# Patient Record
Sex: Female | Born: 1935
Health system: Southern US, Community
[De-identification: ages and names within clinical notes are randomized; demographics above are authoritative.]

## PROBLEM LIST (undated history)

## (undated) DIAGNOSIS — I499 Cardiac arrhythmia, unspecified: Secondary | ICD-10-CM

## (undated) DIAGNOSIS — E785 Hyperlipidemia, unspecified: Secondary | ICD-10-CM

## (undated) DIAGNOSIS — H259 Unspecified age-related cataract: Secondary | ICD-10-CM

## (undated) DIAGNOSIS — R0602 Shortness of breath: Secondary | ICD-10-CM

## (undated) DIAGNOSIS — N183 Chronic kidney disease, stage 3 unspecified: Secondary | ICD-10-CM

## (undated) DIAGNOSIS — R000 Tachycardia, unspecified: Secondary | ICD-10-CM

## (undated) DIAGNOSIS — N189 Chronic kidney disease, unspecified: Secondary | ICD-10-CM

## (undated) DIAGNOSIS — E559 Vitamin D deficiency, unspecified: Secondary | ICD-10-CM

## (undated) DIAGNOSIS — I10 Essential (primary) hypertension: Secondary | ICD-10-CM

## (undated) DIAGNOSIS — M6281 Muscle weakness (generalized): Secondary | ICD-10-CM

## (undated) DIAGNOSIS — R002 Palpitations: Secondary | ICD-10-CM

## (undated) DIAGNOSIS — IMO0002 Reserved for concepts with insufficient information to code with codable children: Secondary | ICD-10-CM

## (undated) DIAGNOSIS — E1129 Type 2 diabetes mellitus with other diabetic kidney complication: Secondary | ICD-10-CM

## (undated) DIAGNOSIS — G4700 Insomnia, unspecified: Secondary | ICD-10-CM

## (undated) DIAGNOSIS — N390 Urinary tract infection, site not specified: Secondary | ICD-10-CM

## (undated) DIAGNOSIS — E538 Deficiency of other specified B group vitamins: Secondary | ICD-10-CM

## (undated) DIAGNOSIS — R011 Cardiac murmur, unspecified: Secondary | ICD-10-CM

## (undated) DIAGNOSIS — R7309 Other abnormal glucose: Secondary | ICD-10-CM

## (undated) DIAGNOSIS — M542 Cervicalgia: Secondary | ICD-10-CM

## (undated) DIAGNOSIS — R5381 Other malaise: Secondary | ICD-10-CM

## (undated) DIAGNOSIS — B356 Tinea cruris: Secondary | ICD-10-CM

## (undated) DIAGNOSIS — I639 Cerebral infarction, unspecified: Secondary | ICD-10-CM

## (undated) DIAGNOSIS — M1710 Unilateral primary osteoarthritis, unspecified knee: Secondary | ICD-10-CM

## (undated) DIAGNOSIS — R5383 Other fatigue: Secondary | ICD-10-CM

## (undated) DIAGNOSIS — I951 Orthostatic hypotension: Secondary | ICD-10-CM

## (undated) DIAGNOSIS — R0600 Dyspnea, unspecified: Secondary | ICD-10-CM

## (undated) HISTORY — DX: Type 2 diabetes mellitus with other diabetic kidney complication: E11.29

## (undated) HISTORY — DX: Shortness of breath: R06.02

## (undated) HISTORY — DX: Insomnia, unspecified: G47.00

## (undated) HISTORY — DX: Tachycardia, unspecified: R00.0

## (undated) HISTORY — DX: Tinea cruris: B35.6

## (undated) HISTORY — DX: Cervicalgia: M54.2

## (undated) HISTORY — DX: Other malaise: R53.81

## (undated) HISTORY — DX: Chronic kidney disease, stage 3 unspecified: N18.30

## (undated) HISTORY — DX: Cardiac murmur, unspecified: R01.1

## (undated) HISTORY — PX: TONSILLECTOMY: SUR1361

## (undated) HISTORY — DX: Unspecified age-related cataract: H25.9

## (undated) HISTORY — DX: Other abnormal glucose: R73.09

## (undated) HISTORY — DX: Essential (primary) hypertension: I10

## (undated) HISTORY — DX: Chronic kidney disease, stage 3 (moderate): N18.3

## (undated) HISTORY — DX: Deficiency of other specified B group vitamins: E53.8

## (undated) HISTORY — DX: Vitamin D deficiency, unspecified: E55.9

## (undated) HISTORY — DX: Palpitations: R00.2

## (undated) HISTORY — DX: Muscle weakness (generalized): M62.81

## (undated) HISTORY — PX: EYE SURGERY: SHX253

## (undated) HISTORY — DX: Other malaise: R53.83

## (undated) HISTORY — PX: CATARACT EXTRACTION, BILATERAL: SHX1313

## (undated) HISTORY — DX: Reserved for concepts with insufficient information to code with codable children: IMO0002

## (undated) HISTORY — DX: Unilateral primary osteoarthritis, unspecified knee: M17.10

## (undated) HISTORY — DX: Hyperlipidemia, unspecified: E78.5

---
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
## 1992-03-20 HISTORY — PX: CATARACT EXTRACTION W/PHACO: SHX586

## 2000-03-20 HISTORY — PX: TOE AMPUTATION: SHX809

## 2000-05-31 ENCOUNTER — Encounter: Payer: Self-pay | Admitting: Internal Medicine

## 2000-05-31 ENCOUNTER — Encounter: Admission: RE | Admit: 2000-05-31 | Discharge: 2000-05-31 | Payer: Self-pay | Admitting: Internal Medicine

## 2000-07-04 ENCOUNTER — Ambulatory Visit (HOSPITAL_COMMUNITY): Admission: RE | Admit: 2000-07-04 | Discharge: 2000-07-04 | Payer: Self-pay | Admitting: Internal Medicine

## 2000-10-15 ENCOUNTER — Ambulatory Visit: Admission: RE | Admit: 2000-10-15 | Discharge: 2000-10-15 | Payer: Self-pay | Admitting: Pulmonary Disease

## 2000-10-31 ENCOUNTER — Encounter: Admission: RE | Admit: 2000-10-31 | Discharge: 2000-10-31 | Payer: Self-pay | Admitting: Internal Medicine

## 2000-11-26 ENCOUNTER — Inpatient Hospital Stay (HOSPITAL_COMMUNITY): Admission: EM | Admit: 2000-11-26 | Discharge: 2000-12-01 | Payer: Self-pay

## 2006-02-13 ENCOUNTER — Ambulatory Visit: Payer: Self-pay | Admitting: Infectious Diseases

## 2006-02-13 ENCOUNTER — Inpatient Hospital Stay (HOSPITAL_COMMUNITY): Admission: EM | Admit: 2006-02-13 | Discharge: 2006-02-14 | Payer: Self-pay | Admitting: Emergency Medicine

## 2006-02-14 ENCOUNTER — Ambulatory Visit: Payer: Self-pay | Admitting: Cardiovascular Disease

## 2006-02-14 ENCOUNTER — Encounter: Payer: Self-pay | Admitting: Cardiovascular Disease

## 2008-11-10 ENCOUNTER — Encounter: Payer: Self-pay | Admitting: Internal Medicine

## 2008-11-10 ENCOUNTER — Encounter: Admission: RE | Admit: 2008-11-10 | Discharge: 2008-11-10 | Payer: Self-pay | Admitting: Internal Medicine

## 2008-11-30 ENCOUNTER — Ambulatory Visit: Payer: Self-pay | Admitting: Internal Medicine

## 2008-11-30 DIAGNOSIS — I10 Essential (primary) hypertension: Secondary | ICD-10-CM | POA: Insufficient documentation

## 2008-11-30 DIAGNOSIS — Z8679 Personal history of other diseases of the circulatory system: Secondary | ICD-10-CM | POA: Insufficient documentation

## 2008-11-30 DIAGNOSIS — R0600 Dyspnea, unspecified: Secondary | ICD-10-CM | POA: Insufficient documentation

## 2010-08-05 NOTE — Op Note (Signed)
[place]. [facility]  Patient:    Lisa [name], Lisa [name] Visit Number: [id number] MRN: [id number]          Service Type: MED Location: 5000 [id number] Attending Physician:  Sherri Rad Proc. Date: 11/26/00 Admit Date:  11/26/2000                             Operative Report  PREOPERATIVE DIAGNOSIS:  Lawnmower injury, left foot, with severe dorsal and plantar skeletonization of the great toe and second toe with gross contamination and devascularization.  POSTOPERATIVE DIAGNOSIS:  Lawnmower injury, left foot, with severe dorsal and plantar skeletonization of the great toe and second toe with gross contamination and devascularization.  PROCEDURE:  Left great toe and second toe amputations through the metatarsophalangeal joint with irrigation and debridement to bone.  ANESTHESIA:  General endotracheal tube.  SURGEON:  Sherri Rad, M.D.  ASSISTANT:  None.  TOURNIQUET:  None.  COMPLICATIONS:  None.  DISPOSITION:  Stable to the PR.  INDICATIONS:  This is a 77 year old female who was mowing her lawn today when she slipped and the lawnmower cut off the left great toe and second toe.  She was then taken to [facility] ER where x-rays were obtained and I was consulted for further evaluation and treatment.  She was taken emergently to the OR.  She was given Ancef, gentamicin, and penicillin g.  It was explained that there was a chance that she may develop infection.  She will require an amputation of the left great toe and second toe, most likely, and with repeat debridements.  All questions were answered.  DESCRIPTION OF PROCEDURE:  The patient was brought to the operating room and placed in the supine position after adequate general endotracheal tube anesthesia was administered as well as the Ancef, gentamicin, and penicillin g were given IV piggyback.  The left lower extremity was then prepped and draped in a sterile manner.  No tourniquet was used.  The  grass and dirt were debrided with pulse lavage as well as meticulous curetting and sharp dissection, rongeur, etc.  The second toe as well as the great toe were completely skeletonized with the great toe having a large plantar flap.  The periosteum was also removed.  Tendons were lacerated.  It was decided at this point that the great toe as well as the second toe would be amputated at the metatarsophalangeal joint.  Grossly devascularized skin was removed involving the second toe but the plantar portion of the great toe was left for it seemed to be viable.  Once it was irrigated with nine liters of normal saline as well as approximately a liter of antibiotic solution, the wound was loosely closed with 3-0 nylon suture.  Sterile dressing was applied and the patient was stable at PR.  POSTOPERATIVE COURSE:  The patient will undergo repeat irrigation and debridement on Wednesday of this week in 48 hours.  I will also consult Dr. Stephens November, a plastic surgeon, for possible closure and flapping of this area. Dr. Stephens November worked on a friend of the family and this is why we will consult him.  We will continue the IV antibiotics with Ancef, gentamicin, and penicillin g.  We will keep this elevated.  She is nonweightbearing, left lower extremity. Attending Physician:  Sherri Rad DD:  11/26/00 TD:  11/26/00 Job: [id number]

## 2010-08-05 NOTE — H&P (Signed)
[place]. [facility]  Patient:    Lisa [name], Lisa [name] Visit Number: [id number] MRN: [id number]          Service Type: MED Location: [phone] [id number] Attending Physician:  Pearletha Alfred Dictated by:   Sherri Rad, M.D. Admit Date:  11/26/2000                           History and Physical  HISTORY & PHYSICAL AND EMERGENCY ROOM CONSULTATION  CHIEF COMPLAINT:  Left foot pain.  HISTORY:  Lisa [name] is a 77 year old female who, while mowing her lawn today, slipped and had her left foot run over by the lawn mower.  She had a severe injury to the left forefoot.  She was then taken to [facility] ER where x-rays were obtained, and I was consulted for further evaluation and treatment.  PAST MEDICAL HISTORY: 1. Hypertension. 2. Tubal ligation approximately 50 years ago. 3. Tonsillectomy.  SOCIAL HISTORY:  She does not smoke.  ALLERGIES:  No known drug allergies.  MEDICATIONS:  She takes Norvasc for hypertension.  PHYSICAL EXAMINATION:  EXTREMITIES:  She has gross grass and dirt contamination of a mangled left forefoot.  She has also palpable dorsalis pedis and posterior tibial pulses, no active bleeding, just oozing.  The second toe as well as the great toe are skeletonized with a plantar flap that is grossly contaminated.  LABORATORY DATA: X-rays reveal gross comminution of the distal phalanx of the left great toe as well as an amputation through the distal phalanx of the second lesser toe on three views.  IMPRESSION:  Severe lawn mower injury of left forefoot.  PLAN:  We will perform an irrigation and debridement of her left foot.  We will most likely have to perform an amputation of the left great toe as well as the second toe.  She will most likely need a plastic surgery consultation for possible closure or local flap.  We will most likely do a repeat I&D in 48 hours, and she will also have Ancef, gentamicin, and penicillin G. Dictated by:   Sherri Rad, M.D. Attending Physician:  [place] B DD:  11/26/00 TD:  11/26/00 Job: [id number]

## 2010-08-05 NOTE — Op Note (Signed)
[place]. [facility]  Patient:    Lisa [name], Lisa [name] Visit Number: [id number] MRN: [id number]          Service Type: MED Location: 5000 [id number] Attending Physician:  Sherri Rad Dictated by:   Yaakov Guthrie. Shon Hough, M.D. Proc. Date: 11/28/00 Admit Date:  11/26/2000   CC:         Sherri Rad, M.D.   Operative Report  BRIEF HISTORY:  This is a 77 year old lady who was admitted to the hospital 3 days ago because of severe injuries to her left foot secondary to a lawn mower accident. The patients left foot accidentally stepped on the lawn mower sustaining multiple trauma.  She was admitted to the hospital by Dr. Leonides Grills, for the foot injury, and he took her immediately to surgery and underwent debridement, and amputation of the second toe, and temporary flap closure of the area.  PROCEDURES PLANNED:  Debridement, washing of the area, advancement of the flap which is based on the plantar surface.  Split thickness skin graft to the third toe.  SURGEON:  Yaakov Guthrie. Shon Hough, M.D.  ASSISTANT:  Sherri Rad, M.D.  ANESTHESIA:  General.  DESCRIPTION OF PROCEDURE:  The patient underwent general anesthesia and was intubated orally.  The prep was done to the left foot leg areas as well as the left thigh separately using Betadine soap and solution and walled off with sterile towels and drapes so as to make a sterile field.  The previous flap sutures were removed, then the areas were irrigated out with copious amounts of pulsatile lavage using several liters of fluid.  The wound seemed actually very clean.  No evidence of any gross contamination.  The flap was then examined and the edges were debrided.  The skin over the medial portion of the third toe was debrided in that whole area down to underlying bleeding.  Next, the flap was placed into an area to cover over the underlying tissues in a V-Y fashion as well and part of the Y was closed  posteriorly with 4-0 nylon, and then a flap was attached loosely as it was rotated in position without tension with 4-0 nylon.  Next, the split thickness skin graft of 0.025 thickness was taken from the left anterolateral thigh using a dermatome.  The donor site was covered with scarlet red, Telfa, 4 x 4s, ABDs, hypofix and then the graft was placed over the defect, and secured with 4-0 Prolene and 4-0 nylon sutures. xeroform gauze and a fluffy dressing was applied to the foot.  ABDs, Kerlix and Ace wrap.  She withstood the procedures very well and was taken to recovery in excellent condition.  Will follow her along and reexamine her on Friday morning of this week to assess the wounds without any hematoma formation or infection.  We will continue antibiotic therapy by mouth and in the meantime Duricef 500 mg twice a day. Dictated by:   Yaakov Guthrie. Shon Hough, M.D. Attending Physician:  Sherri Rad DD:  11/28/00 TD:  11/28/00 Job: [id number]

## 2010-08-05 NOTE — Discharge Summary (Signed)
[place]. [facility]  Patient:    Lisa [name], Lisa [name] Visit Number: [id number] MRN: [id number]          Service Type: Attending:  Sherri Rad, M.D. Dictated by:   Druscilla Brownie. Shela Nevin, P.A. Adm. Date:  11/26/00 Disc. Date: 12/01/00   CC:         Earvin Hansen L. Shon Hough, M.D.   Discharge Summary  ADMITTING DIAGNOSES: 1. Severe lawn mower injury of the left forefoot. 2. Hypertension.  DISCHARGE DIAGNOSES: 1. Severe lawn mower injury of the left forefoot. 2. Hypertension.  CONSULTATION:  Yaakov Guthrie. Shon Hough, M.D., plastic surgery.  PROCEDURE:  On November 28, 2000, the patient underwent debridement and washing of the area, advancement of flap which is based on the plantar surface with split-thickness skin graft to the third toe.  Surgeon Dr. Louisa Second and Dr. Leonides Grills assisted.  HISTORY OF PRESENT ILLNESS:  This 77 year old lady was admitted to the hospital after a severe lawn mower injury to her left foot.  She accidentally stepped on the lawn mower and sustained multiple trauma.  Dr. Lestine Box admitted the patient and then we asked to Dr. Shon Hough to assist [place] for the surgery mentioned above.  Immediately on discharge, I&D was done and unfortunately had to amputate the second toe.  Dr. Shon Hough was brought in for his expertise concerning the split-thickness skin graft to the toe.  HOSPITAL COURSE:  The patient tolerated the surgery quite well. Unfortunately, no gross infection was seen.  She did have a moderate amount of discomfort postoperatively.  We eventually were able to control this.  The remaining digits remained quite viable.  The patient was placed on IV antibiotics and continued throughout her hospitalization.  Penicillin G 3 million units IV q.4h. was given.  We also put her on gentamicin 90 mg IV q.12h.  She did well postoperatively.  Dr. Lestine Box did dressing change.  She will follow up with Dr. Shon Hough Thursday after  discharge and then see Dr. Lestine Box after that.  DISCHARGE MEDICATIONS: 1. Keflex 500 mg one p.o. q.i.d. x 6 weeks. 2. Vicodin.  LABORATORY DATA AND X-RAY FINDINGS:  CBC with differential within normal limits as well as her blood chemistries.  Chest x-ray showed no active disease.  Electrocardiogram showed normal sinus rhythm with right bundle branch block.  CONDITION ON DISCHARGE:  Improved and stable.  FOLLOWUP:  The patient is to see Dr. Shon Hough this next Thursday and then follow with Dr. Lestine Box after Dr. Shon Hough has discharged her.  SPECIAL INSTRUCTIONS:  She will call should she have any problems.  Any concerns of the foot should be through Dr. Shon Hough. ictated by:   Druscilla Brownie. Shela Nevin, P.A. Attending:  Sherri Rad, M.D. DD:  12/01/00 TD:  12/01/00 Job: [phone] [id number]

## 2012-09-10 ENCOUNTER — Other Ambulatory Visit: Payer: Medicare Other

## 2012-09-10 ENCOUNTER — Other Ambulatory Visit: Payer: Self-pay | Admitting: *Deleted

## 2012-09-10 DIAGNOSIS — E785 Hyperlipidemia, unspecified: Secondary | ICD-10-CM

## 2012-09-10 DIAGNOSIS — E559 Vitamin D deficiency, unspecified: Secondary | ICD-10-CM

## 2012-09-10 DIAGNOSIS — E1129 Type 2 diabetes mellitus with other diabetic kidney complication: Secondary | ICD-10-CM

## 2012-09-10 DIAGNOSIS — I10 Essential (primary) hypertension: Secondary | ICD-10-CM

## 2012-09-11 LAB — BASIC METABOLIC PANEL
BUN/Creatinine Ratio: 21 (ref 11–26)
BUN: 26 mg/dL (ref 8–27)
CO2: 23 mmol/L (ref 18–29)
Calcium: 9.1 mg/dL (ref 8.6–10.2)
Chloride: 103 mmol/L (ref 97–108)
Creatinine, Ser: 1.22 mg/dL — ABNORMAL HIGH (ref 0.57–1.00)
GFR calc Af Amer: 45 mL/min/{1.73_m2} — ABNORMAL LOW (ref >59)
GFR calc non Af Amer: 39 mL/min/{1.73_m2} — ABNORMAL LOW (ref >59)
Glucose: 115 mg/dL — ABNORMAL HIGH (ref 65–99)
Potassium: 4.5 mmol/L (ref 3.5–5.2)
Sodium: 141 mmol/L (ref 134–144)

## 2012-09-11 LAB — CBC WITH DIFFERENTIAL/PLATELET
Basophils Absolute: 0 10*3/uL (ref 0.0–0.2)
Basos: 1 % (ref 0–3)
Eos: 6 % — ABNORMAL HIGH (ref 0–5)
Eosinophils Absolute: 0.4 10*3/uL (ref 0.0–0.4)
HCT: 41 % (ref 34.0–46.6)
Hemoglobin: 13.9 g/dL (ref 11.1–15.9)
Immature Grans (Abs): 0 10*3/uL (ref 0.0–0.1)
Immature Granulocytes: 0 % (ref 0–2)
Lymphocytes Absolute: 1.6 10*3/uL (ref 0.7–3.1)
Lymphs: 25 % (ref 14–46)
MCH: 29.8 pg (ref 26.6–33.0)
MCHC: 33.9 g/dL (ref 31.5–35.7)
MCV: 88 fL (ref 79–97)
Monocytes Absolute: 0.5 10*3/uL (ref 0.1–0.9)
Monocytes: 7 % (ref 4–12)
Neutrophils Absolute: 4 10*3/uL (ref 1.4–7.0)
Neutrophils Relative %: 61 % (ref 40–74)
RBC: 4.67 x10E6/uL (ref 3.77–5.28)
RDW: 13.6 % (ref 12.3–15.4)
WBC: 6.5 10*3/uL (ref 3.4–10.8)

## 2012-09-11 LAB — VITAMIN D 25 HYDROXY (VIT D DEFICIENCY, FRACTURES): Vit D, 25-Hydroxy: 34.2 ng/mL (ref 30.0–100.0)

## 2012-09-11 LAB — LIPID PANEL
Chol/HDL Ratio: 7.5 ratio units — ABNORMAL HIGH (ref 0.0–4.4)
Cholesterol, Total: 188 mg/dL (ref 100–199)
HDL: 25 mg/dL — ABNORMAL LOW (ref >39)
LDL Calculated: 114 mg/dL — ABNORMAL HIGH (ref 0–99)
Triglycerides: 246 mg/dL — ABNORMAL HIGH (ref 0–149)
VLDL Cholesterol Cal: 49 mg/dL — ABNORMAL HIGH (ref 5–40)

## 2012-09-11 LAB — MICROALBUMIN / CREATININE URINE RATIO
Creatinine, Ur: 48.3 mg/dL (ref 15.0–328.0)
MICROALB/CREAT RATIO: 29.8 mg/g creat (ref 0.0–30.0)
Microalbumin, Urine: 14.4 ug/mL (ref 0.0–17.0)

## 2012-09-11 LAB — HEMOGLOBIN A1C
Est. average glucose Bld gHb Est-mCnc: 131 mg/dL
Hgb A1c MFr Bld: 6.2 % — ABNORMAL HIGH (ref 4.8–5.6)

## 2012-09-12 ENCOUNTER — Encounter: Payer: Self-pay | Admitting: *Deleted

## 2012-09-12 ENCOUNTER — Ambulatory Visit (INDEPENDENT_AMBULATORY_CARE_PROVIDER_SITE_OTHER): Payer: Medicare Other | Admitting: Internal Medicine

## 2012-09-12 ENCOUNTER — Encounter: Payer: Self-pay | Admitting: Internal Medicine

## 2012-09-12 VITALS — BP 158/84 | HR 72 | Temp 97.9°F | Resp 18 | Ht 67.0 in | Wt 165.0 lb

## 2012-09-12 DIAGNOSIS — E1129 Type 2 diabetes mellitus with other diabetic kidney complication: Secondary | ICD-10-CM | POA: Insufficient documentation

## 2012-09-12 DIAGNOSIS — N183 Chronic kidney disease, stage 3 unspecified: Secondary | ICD-10-CM | POA: Insufficient documentation

## 2012-09-12 DIAGNOSIS — M542 Cervicalgia: Secondary | ICD-10-CM | POA: Insufficient documentation

## 2012-09-12 DIAGNOSIS — R011 Cardiac murmur, unspecified: Secondary | ICD-10-CM

## 2012-09-12 DIAGNOSIS — R0602 Shortness of breath: Secondary | ICD-10-CM

## 2012-09-12 DIAGNOSIS — IMO0002 Reserved for concepts with insufficient information to code with codable children: Secondary | ICD-10-CM

## 2012-09-12 DIAGNOSIS — G4700 Insomnia, unspecified: Secondary | ICD-10-CM

## 2012-09-12 DIAGNOSIS — E785 Hyperlipidemia, unspecified: Secondary | ICD-10-CM

## 2012-09-12 DIAGNOSIS — M1710 Unilateral primary osteoarthritis, unspecified knee: Secondary | ICD-10-CM

## 2012-09-12 DIAGNOSIS — I10 Essential (primary) hypertension: Secondary | ICD-10-CM

## 2012-09-12 DIAGNOSIS — M1711 Unilateral primary osteoarthritis, right knee: Secondary | ICD-10-CM | POA: Insufficient documentation

## 2012-09-12 MED ORDER — ZOLPIDEM TARTRATE 5 MG PO TABS: 5.0000 mg | ORAL_TABLET | Freq: Every evening | ORAL | Status: DC | PRN

## 2012-09-12 MED ORDER — TRAMADOL HCL 50 MG PO TABS: 50.0000 mg | ORAL_TABLET | Freq: Every day | ORAL | Status: DC | PRN

## 2012-09-12 NOTE — Progress Notes (Signed)
Patient ID: Lisa [name], female   DOB: 05-01-1921, 79 y.o.   MRN: [id number] Location:  [facility]  Code Status: DNR   No Known Allergies  Chief Complaint  Patient presents with  . Medical Managment of Chronic Issues    no new problems - still SOB. Needs new sleep aid.    HPI: Patient is a 79 y.o. female seen in the office today for f/u of her chronic conditions. Gets shorter of breath everyday and can't get her breath.  Is more tired.  Has to go a lot slower than she used to.  Occasional palpitations.  No orthopnea.  Only sob with exertion and progressive.  BP continues to fluctuate--notes when eats salt and when comes here it is high.  Discussed repeating an echo to reassess her cardiac function, but she does not really want this unless it will change management.  She's had an xray, echo, stress test and seen both cardiology and pulmonary about 5 years ago about her breathing problem, and nothing came of the tests.  Says if valve function was worse, she would not want any surgery on heart of any sort.  Has a brother with an aortic valve problem who has been living for years since he was told he needed surgery.  Review of Systems:  Review of Systems  Constitutional: Positive for malaise/fatigue. Negative for fever and chills.  HENT: Positive for neck pain. Negative for hearing loss and congestion.   Eyes: Negative for blurred vision.  Respiratory: Positive for shortness of breath. Negative for cough.   Cardiovascular: Negative for chest pain, palpitations, orthopnea, leg swelling and PND.  Gastrointestinal: Negative for heartburn.  Genitourinary: Negative for dysuria.  Musculoskeletal: Positive for joint pain. Negative for falls.  Skin: Negative for rash.  Neurological: Negative for dizziness.  Endo/Heme/Allergies: Bruises/bleeds easily.  Psychiatric/Behavioral: Negative for depression and memory loss.     Past Medical History   Diagnosis Date  . Unspecified vitamin D deficiency   . Regional enteritis of unspecified site   . Hyperlipidemia LDL goal < 100   . Osteoarthrosis, unspecified whether generalized or localized, lower leg   . Dermatophytosis of groin and perianal area   . Cervicalgia   . Insomnia, unspecified   . Chronic kidney disease, stage III (moderate)   . Type II or unspecified type diabetes mellitus with renal manifestations, not stated as uncontrolled(250.40)   . Other abnormal glucose   . Other B-complex deficiencies   . Other malaise and fatigue   . Senile cataract, unspecified   . Unspecified essential hypertension   . Muscle weakness (generalized)   . Tachycardia, unspecified   . Palpitations   . Undiagnosed cardiac murmurs   . Shortness of breath     Past Surgical History  Procedure Laterality Date  . Tonsillectomy  1944  . Tubal ligation  1951  . Toe amputation  2002  . Eye surgery      Social History:   reports that she has never smoked. She does not have any smokeless tobacco history on file. She reports that she does not drink alcohol or use illicit drugs.  No family history on file.  Medications: Patient's Medications  New Prescriptions   ZOLPIDEM (AMBIEN) 5 MG TABLET    Take 1 tablet (5 mg total) by mouth at bedtime as needed for sleep.  Previous Medications   ASPIRIN 81 MG TABLET    Take 81 mg by mouth daily.   CALCIUM-VITAMIN  D (OSCAL WITH D) 500-200 MG-UNIT PER TABLET    Take 1 tablet by mouth.   CARVEDILOL (COREG) 6.25 MG TABLET    Take one tablet by mouth twice daily for blood pressure   CYANOCOBALAMIN 1000 MCG TABLET    Take 100 mcg by mouth daily.   HYDROCHLOROTHIAZIDE (HYDRODIURIL) 25 MG TABLET    Take one tablet by mouth once daily as needed   VITAMIN D, ERGOCALCIFEROL, (DRISDOL) 50000 UNITS CAPS    50,000 Units. Take one capsule by mouth weekly for vitamin D  Modified Medications   Modified Medication Previous Medication   TRAMADOL (ULTRAM) 50 MG TABLET  traMADol (ULTRAM) 50 MG tablet      Take 1 tablet (50 mg total) by mouth daily as needed for pain. Take one tablet twice daily as needed for neck pain    Take one tablet twice daily as needed for neck pain  Discontinued Medications   ZOLPIDEM (AMBIEN) 5 MG TABLET    Take one tablet by mouth at bedtime for sleep     Physical Exam: Filed Vitals:   09/12/12 1320  BP: 158/84  Pulse: 72  Temp: 97.9 F (36.6 C)  TempSrc: Oral  Resp: 18  Height: 5\' 7"  (1.702 m)  Weight: 165 lb (74.844 kg)  SpO2: 97%  Physical Exam  Constitutional: She is oriented to person, place, and time. She appears well-developed and well-nourished. No distress.  Caucasian female  HENT:  Head: Normocephalic and atraumatic.  Cardiovascular: Normal rate, regular rhythm and intact distal pulses.   Murmur heard. Pulmonary/Chest: Effort normal and breath sounds normal. No respiratory distress. She has no wheezes.  Abdominal: Soft. Bowel sounds are normal. She exhibits no distension. There is no tenderness.  Musculoskeletal: Normal range of motion. She exhibits no edema and no tenderness.  Neurological: She is alert and oriented to person, place, and time.  Skin: Skin is warm and dry.  Psychiatric: She has a normal mood and affect.    Labs reviewed: Basic Metabolic Panel:  Recent Labs  40/98/11 0823  NA 141  K 4.5  CL 103  CO2 23  GLUCOSE 115*  BUN 26  CREATININE 1.22*  CALCIUM 9.1  CBC:  Recent Labs  09/10/12 0823  WBC 6.5  NEUTROABS 4.0  HGB 13.9  HCT 41.0  MCV 88   Lipid Panel:  Recent Labs  09/10/12 0823  HDL 25*  LDLCALC 114*  TRIG 246*  CHOLHDL 7.5*   Lab Results  Component Value Date   HGBA1C 6.2* 09/10/2012    Past Procedures:  Assessment/Plan Osteoarthrosis, unspecified whether generalized or localized, lower leg Stable with tramadol as needed.  Has good and bad days.  Remains active, but cannot get as much done as she used to.    Undiagnosed cardiac murmurs Has had  workup three times for shortness of breath w/o findings.  Notes her dyspnea on exertion does seem to gradually be getting worse and keeping her from doing quite as much in a day, but she does not want any repeat studies like echo, xray, CT chest.  Quality of life remains reasonable for her.    Shortness of breath Suspect this may be related to progression of valvular disease or simply cardiac pump failure over time, but she does not want more investigation.    Type II or unspecified type diabetes mellitus with renal manifestations, not stated as uncontrolled(250.40) hba1c remains in prediabetic range and has actually been improving over the past year.  Not on meds  for this except baby asa.  Has refused statin therapy.  Chronic kidney disease, stage III (moderate) Has been stable.  Avoid nsaids and nephrotoxic meds.  Keep hydrated.  Cervicalgia Neck pain comes and goes, tramadol is working well.  No changes needed.  HYPERTENSION bp goal is <150/90.  Is above goal today, but notes that outside of the office, it is "all over the board" from 110s to 180s, but without any consistency.  Does not want changes.   Labs/tests ordered:  None today Next appt:  6 mos

## 2012-09-14 ENCOUNTER — Encounter: Payer: Self-pay | Admitting: Internal Medicine

## 2012-09-14 DIAGNOSIS — E785 Hyperlipidemia, unspecified: Secondary | ICD-10-CM | POA: Insufficient documentation

## 2012-09-14 DIAGNOSIS — G4700 Insomnia, unspecified: Secondary | ICD-10-CM | POA: Insufficient documentation

## 2012-09-14 NOTE — Assessment & Plan Note (Signed)
F/u flp showed LDL remains above goal, but she will not take a statin or any cholesterol pills, but is trying to watch what she eats.

## 2012-09-14 NOTE — Assessment & Plan Note (Signed)
Stable with tramadol as needed.  Has good and bad days.  Remains active, but cannot get as much done as she used to.

## 2012-09-14 NOTE — Assessment & Plan Note (Signed)
Has been stable.  Avoid nsaids and nephrotoxic meds.  Keep hydrated.

## 2012-09-14 NOTE — Assessment & Plan Note (Signed)
For some reason, her insurance stopped covering her ambien 5mg .  She insists on having this to help her sleep and has not had complications from it.  When I ordered it, it appears it will now be covered.  She is to call if it is not so I can switch her to an alternative that is more affordable for her.

## 2012-09-14 NOTE — Assessment & Plan Note (Addendum)
bp goal is <150/90.  Is above goal today, but notes that outside of the office, it is "all over the board" from 110s to 180s, but without any consistency.  Does not want changes.

## 2012-09-14 NOTE — Assessment & Plan Note (Signed)
hba1c remains in prediabetic range and has actually been improving over the past year.  Not on meds for this except baby asa.  Has refused statin therapy.

## 2012-09-14 NOTE — Assessment & Plan Note (Signed)
Has had workup three times for shortness of breath w/o findings.  Notes her dyspnea on exertion does seem to gradually be getting worse and keeping her from doing quite as much in a day, but she does not want any repeat studies like echo, xray, CT chest.  Quality of life remains reasonable for her.

## 2012-09-14 NOTE — Assessment & Plan Note (Signed)
Neck pain comes and goes, tramadol is working well.  No changes needed.

## 2012-09-14 NOTE — Assessment & Plan Note (Signed)
Suspect this may be related to progression of valvular disease or simply cardiac pump failure over time, but she does not want more investigation.

## 2013-02-10 ENCOUNTER — Ambulatory Visit (INDEPENDENT_AMBULATORY_CARE_PROVIDER_SITE_OTHER): Payer: Medicare Other | Admitting: Internal Medicine

## 2013-02-10 ENCOUNTER — Encounter: Payer: Self-pay | Admitting: Internal Medicine

## 2013-02-10 VITALS — BP 150/64 | HR 66 | Temp 97.4°F | Resp 16 | Wt 163.4 lb

## 2013-02-10 DIAGNOSIS — H6692 Otitis media, unspecified, left ear: Secondary | ICD-10-CM

## 2013-02-10 DIAGNOSIS — H6690 Otitis media, unspecified, unspecified ear: Secondary | ICD-10-CM

## 2013-02-10 DIAGNOSIS — J019 Acute sinusitis, unspecified: Secondary | ICD-10-CM

## 2013-02-10 MED ORDER — AZITHROMYCIN 250 MG PO TABS: ORAL_TABLET | ORAL | Status: DC

## 2013-02-10 NOTE — Patient Instructions (Signed)
Please try zithromax antibiotic for your ear infection and sinus infection. Drink plenty of fluids Also try rinsing your sinuses with saline to help with congestion. You may continue to use mucinex.   Get plenty of sleep.

## 2013-02-10 NOTE — Progress Notes (Signed)
Patient ID: Lisa [name], female   DOB: 1921/09/27, 79 y.o.   MRN: [id number]   Location:  [facility]  Code Status: DNR   No Known Allergies  Chief Complaint  Patient presents with  . Acute Visit    Lt ear pain and sinus pain on the LT side of head/eye x 1 week, she ahs taken amoxicillin wiht llittle relief.    HPI: Patient is a 79 y.o. white female seen in the office today for acute visit with left ear pain and sinus pain on the left side of his head and left eye for a week.  She was started on amoxicillin, but has had little relief.  Received at urgent care.  Is still not well.  Feels she should be better.  No fevers.  Says she usually doesn't get one.  Right also sore, but left side and ear more painful.  Took some mucinex for the congestion also.  Has not tried sinus rinse.  Humidity has been helping temporarily.  No sick contacts.  Appetite has been diminished--eats b/c she needs to.  Has been trying to drink fluids.  Has been forcing herself to drink.  Two weeks long now.    Review of Systems:  Review of Systems  Constitutional: Positive for malaise/fatigue. Negative for fever and chills.  HENT: Positive for congestion and ear pain. Negative for sore throat.   Eyes: Negative for blurred vision.  Respiratory: Negative for cough and shortness of breath.   Cardiovascular: Negative for chest pain.  Gastrointestinal: Negative for heartburn and constipation.  Genitourinary: Negative for dysuria.  Musculoskeletal: Negative for myalgias.  Skin: Negative for rash.  Neurological: Negative for dizziness.  Psychiatric/Behavioral: Negative for memory loss.    Past Medical History  Diagnosis Date  . Unspecified vitamin D deficiency   . Regional enteritis of unspecified site   . Hyperlipidemia LDL goal < 100   . Osteoarthrosis, unspecified whether generalized or localized, lower leg   . Dermatophytosis of groin and perianal area   .  Cervicalgia   . Insomnia, unspecified   . Chronic kidney disease, stage III (moderate)   . Type II or unspecified type diabetes mellitus with renal manifestations, not stated as uncontrolled(250.40)   . Other abnormal glucose   . Other B-complex deficiencies   . Other malaise and fatigue   . Senile cataract, unspecified   . Unspecified essential hypertension   . Muscle weakness (generalized)   . Tachycardia, unspecified   . Palpitations   . Undiagnosed cardiac murmurs   . Shortness of breath     Past Surgical History  Procedure Laterality Date  . Tonsillectomy  1944  . Tubal ligation  1951  . Toe amputation  2002  . Eye surgery      Social History:   reports that she has never smoked. She does not have any smokeless tobacco history on file. She reports that she does not drink alcohol or use illicit drugs.  History reviewed. No pertinent family history.  Medications: Patient's Medications  New Prescriptions   No medications on file  Previous Medications   ASPIRIN 81 MG TABLET    Take 81 mg by mouth daily.   CALCIUM-VITAMIN D (OSCAL WITH D) 500-200 MG-UNIT PER TABLET    Take 1 tablet by mouth.   CARVEDILOL (COREG) 6.25 MG TABLET    Take one tablet by mouth twice daily for blood pressure   CYANOCOBALAMIN 1000 MCG TABLET  Take 100 mcg by mouth daily.   HYDROCHLOROTHIAZIDE (HYDRODIURIL) 25 MG TABLET    Take one tablet by mouth once daily as needed   TRAMADOL (ULTRAM) 50 MG TABLET    Take 1 tablet (50 mg total) by mouth daily as needed for pain. Take one tablet twice daily as needed for neck pain   VITAMIN D, ERGOCALCIFEROL, (DRISDOL) 50000 UNITS CAPS    50,000 Units. Take one capsule by mouth weekly for vitamin D   ZOLPIDEM (AMBIEN) 5 MG TABLET    Take 1 tablet (5 mg total) by mouth at bedtime as needed for sleep.  Modified Medications   No medications on file  Discontinued Medications   No medications on file     Physical Exam: Filed Vitals:   02/10/13 0956  BP:  150/64  Pulse: 66  Temp: 97.4 F (36.3 C)  TempSrc: Oral  Resp: 16  Weight: 163 lb 6.4 oz (74.118 kg)  SpO2: 97%  Physical Exam  Constitutional: She is oriented to person, place, and time. She appears well-developed and well-nourished. No distress.  HENT:  Head: Normocephalic and atraumatic.  Right Ear: External ear normal.  Left Ear: External ear normal.  Mouth/Throat: Oropharyngeal exudate present.  Does have severe erythema of left TM, tenderness over bilateral maxillary sinuses  Cardiovascular: Normal rate, regular rhythm, normal heart sounds and intact distal pulses.   Pulmonary/Chest: Effort normal and breath sounds normal. No respiratory distress.  Abdominal: Soft.  Musculoskeletal: Normal range of motion.  Neurological: She is alert and oriented to person, place, and time.  Skin: Skin is warm and dry.    Labs reviewed: Basic Metabolic Panel:  Recent Labs  16/10/96 0823  NA 141  K 4.5  CL 103  CO2 23  GLUCOSE 115*  BUN 26  CREATININE 1.22*  CALCIUM 9.1  CBC:  Recent Labs  09/10/12 0823  WBC 6.5  NEUTROABS 4.0  HGB 13.9  HCT 41.0  MCV 88   Lipid Panel:  Recent Labs  09/10/12 0823  HDL 25*  LDLCALC 114*  TRIG 246*  CHOLHDL 7.5*   Lab Results  Component Value Date   HGBA1C 6.2* 09/10/2012    Assessment/Plan 1. Acute sinusitis with symptoms > 10 days - did not resolve with amoxicillin alone from urgent care and mucinex - remains quite congested, is afebrile, but says she typically is -will try an alternative abx with broader coverage: - azithromycin (ZITHROMAX) 250 MG tablet; 2 tabs today, then one tab daily for 4 more days.  Dispense: 6 tablet; Refill: 0 -also given nettipot sinus rinse kit and advised to drink  Plenty of fluids and get enough rest, use warm humidity also for congestion  2. Left otitis media -remains despite amoxicillin--will try zpak - azithromycin (ZITHROMAX) 250 MG tablet; 2 tabs today, then one tab daily for 4 more  days.  Dispense: 6 tablet; Refill: 0  Labs/tests ordered:  None added Next appt:  Keep appt in 3 wks as scheduled

## 2013-03-03 ENCOUNTER — Ambulatory Visit: Payer: Medicare Other | Admitting: Internal Medicine

## 2013-03-21 ENCOUNTER — Ambulatory Visit: Payer: Medicare Other | Admitting: Internal Medicine

## 2013-04-09 ENCOUNTER — Inpatient Hospital Stay (HOSPITAL_COMMUNITY)
Admission: EM | Admit: 2013-04-09 | Discharge: 2013-04-11 | DRG: 065 | Disposition: A | Discharge status: Home / Self Care | Payer: Medicare Other | Attending: Internal Medicine | Admitting: Internal Medicine

## 2013-04-09 ENCOUNTER — Encounter (HOSPITAL_COMMUNITY): Payer: Self-pay | Admitting: Emergency Medicine

## 2013-04-09 ENCOUNTER — Emergency Department (HOSPITAL_COMMUNITY): Payer: Medicare Other

## 2013-04-09 DIAGNOSIS — M542 Cervicalgia: Secondary | ICD-10-CM | POA: Diagnosis present

## 2013-04-09 DIAGNOSIS — E1129 Type 2 diabetes mellitus with other diabetic kidney complication: Secondary | ICD-10-CM

## 2013-04-09 DIAGNOSIS — G8190 Hemiplegia, unspecified affecting unspecified side: Secondary | ICD-10-CM | POA: Diagnosis present

## 2013-04-09 DIAGNOSIS — I6350 Cerebral infarction due to unspecified occlusion or stenosis of unspecified cerebral artery: Principal | ICD-10-CM | POA: Diagnosis present

## 2013-04-09 DIAGNOSIS — I10 Essential (primary) hypertension: Secondary | ICD-10-CM | POA: Diagnosis present

## 2013-04-09 DIAGNOSIS — N183 Chronic kidney disease, stage 3 unspecified: Secondary | ICD-10-CM | POA: Diagnosis present

## 2013-04-09 DIAGNOSIS — M1710 Unilateral primary osteoarthritis, unspecified knee: Secondary | ICD-10-CM | POA: Diagnosis present

## 2013-04-09 DIAGNOSIS — R29898 Other symptoms and signs involving the musculoskeletal system: Secondary | ICD-10-CM | POA: Diagnosis present

## 2013-04-09 DIAGNOSIS — E785 Hyperlipidemia, unspecified: Secondary | ICD-10-CM | POA: Diagnosis present

## 2013-04-09 DIAGNOSIS — E559 Vitamin D deficiency, unspecified: Secondary | ICD-10-CM | POA: Diagnosis present

## 2013-04-09 DIAGNOSIS — E538 Deficiency of other specified B group vitamins: Secondary | ICD-10-CM | POA: Diagnosis present

## 2013-04-09 DIAGNOSIS — Z66 Do not resuscitate: Secondary | ICD-10-CM | POA: Diagnosis present

## 2013-04-09 DIAGNOSIS — N058 Unspecified nephritic syndrome with other morphologic changes: Secondary | ICD-10-CM | POA: Diagnosis present

## 2013-04-09 DIAGNOSIS — Z79899 Other long term (current) drug therapy: Secondary | ICD-10-CM

## 2013-04-09 DIAGNOSIS — R29810 Facial weakness: Secondary | ICD-10-CM | POA: Diagnosis present

## 2013-04-09 DIAGNOSIS — I129 Hypertensive chronic kidney disease with stage 1 through stage 4 chronic kidney disease, or unspecified chronic kidney disease: Secondary | ICD-10-CM | POA: Diagnosis present

## 2013-04-09 DIAGNOSIS — I639 Cerebral infarction, unspecified: Secondary | ICD-10-CM

## 2013-04-09 DIAGNOSIS — S98139A Complete traumatic amputation of one unspecified lesser toe, initial encounter: Secondary | ICD-10-CM

## 2013-04-09 DIAGNOSIS — Z7982 Long term (current) use of aspirin: Secondary | ICD-10-CM

## 2013-04-09 LAB — POCT I-STAT, CHEM 8
BUN: 36 mg/dL — ABNORMAL HIGH (ref 6–23)
Calcium, Ion: 1.13 mmol/L (ref 1.13–1.30)
Chloride: 104 mEq/L (ref 96–112)
Creatinine, Ser: 1.1 mg/dL (ref 0.50–1.10)
GLUCOSE: 121 mg/dL — AB (ref 70–99)
HEMATOCRIT: 43 % (ref 36.0–46.0)
HEMOGLOBIN: 14.6 g/dL (ref 12.0–15.0)
POTASSIUM: 4.8 meq/L (ref 3.7–5.3)
Sodium: 141 mEq/L (ref 137–147)
TCO2: 26 mmol/L (ref 0–100)

## 2013-04-09 LAB — CBC WITH DIFFERENTIAL/PLATELET
Basophils Absolute: 0 10*3/uL (ref 0.0–0.1)
Basophils Relative: 0 % (ref 0–1)
EOS ABS: 0.2 10*3/uL (ref 0.0–0.7)
Eosinophils Relative: 4 % (ref 0–5)
HCT: 41.6 % (ref 36.0–46.0)
Hemoglobin: 14 g/dL (ref 12.0–15.0)
LYMPHS PCT: 27 % (ref 12–46)
Lymphs Abs: 1.7 10*3/uL (ref 0.7–4.0)
MCH: 29.7 pg (ref 26.0–34.0)
MCHC: 33.7 g/dL (ref 30.0–36.0)
MCV: 88.3 fL (ref 78.0–100.0)
Monocytes Absolute: 0.3 10*3/uL (ref 0.1–1.0)
Monocytes Relative: 5 % (ref 3–12)
NEUTROS PCT: 64 % (ref 43–77)
Neutro Abs: 4 10*3/uL (ref 1.7–7.7)
PLATELETS: 184 10*3/uL (ref 150–400)
RBC: 4.71 MIL/uL (ref 3.87–5.11)
RDW: 13.1 % (ref 11.5–15.5)
WBC: 6.3 10*3/uL (ref 4.0–10.5)

## 2013-04-09 LAB — APTT: aPTT: 30 seconds (ref 24–37)

## 2013-04-09 LAB — PROTIME-INR
INR: 1.08 (ref 0.00–1.49)
PROTHROMBIN TIME: 13.8 s (ref 11.6–15.2)

## 2013-04-09 LAB — POCT I-STAT TROPONIN I: Troponin i, poc: 0.01 ng/mL (ref 0.00–0.08)

## 2013-04-09 MED ORDER — CARVEDILOL 6.25 MG PO TABS
6.2500 mg | ORAL_TABLET | Freq: Two times a day (BID) | ORAL | Status: DC
  Administered 2013-04-10 – 2013-04-11 (×3): 6.25 mg via ORAL
  Filled 2013-04-09 (×5): qty 1

## 2013-04-09 MED ORDER — ASPIRIN 300 MG RE SUPP
300.0000 mg | Freq: Every day | RECTAL | Status: DC
  Filled 2013-04-09 (×3): qty 1

## 2013-04-09 MED ORDER — HYDRALAZINE HCL 20 MG/ML IJ SOLN
10.0000 mg | Freq: Four times a day (QID) | INTRAMUSCULAR | Status: DC | PRN
  Administered 2013-04-09: 10 mg via INTRAVENOUS
  Filled 2013-04-09: qty 0.5

## 2013-04-09 MED ORDER — TRAMADOL HCL 50 MG PO TABS
50.0000 mg | ORAL_TABLET | Freq: Every day | ORAL | Status: DC | PRN
  Administered 2013-04-10 – 2013-04-11 (×2): 50 mg via ORAL
  Filled 2013-04-09 (×2): qty 1

## 2013-04-09 MED ORDER — ZOLPIDEM TARTRATE 5 MG PO TABS
5.0000 mg | ORAL_TABLET | Freq: Every evening | ORAL | Status: DC | PRN
  Administered 2013-04-10 (×2): 5 mg via ORAL
  Filled 2013-04-09 (×2): qty 1

## 2013-04-09 MED ORDER — SENNOSIDES-DOCUSATE SODIUM 8.6-50 MG PO TABS
1.0000 | ORAL_TABLET | Freq: Every evening | ORAL | Status: DC | PRN
  Filled 2013-04-09: qty 1

## 2013-04-09 MED ORDER — HEPARIN SODIUM (PORCINE) 5000 UNIT/ML IJ SOLN
5000.0000 [IU] | Freq: Three times a day (TID) | INTRAMUSCULAR | Status: DC
  Administered 2013-04-09 – 2013-04-11 (×5): 5000 [IU] via SUBCUTANEOUS
  Filled 2013-04-09 (×8): qty 1

## 2013-04-09 MED ORDER — ASPIRIN 325 MG PO TABS
325.0000 mg | ORAL_TABLET | Freq: Every day | ORAL | Status: DC
  Administered 2013-04-09 – 2013-04-10 (×2): 325 mg via ORAL
  Filled 2013-04-09 (×3): qty 1

## 2013-04-09 NOTE — H&P (Signed)
[facility] History and Physical  Patient: Lisa [name]  ZOX:[id number]  DOB: [birth year]  DOS: the patient was seen and examined on 04/09/2013 PCP: Bufford Spikes, DO  Chief Complaint: Right-sided weakness and numbness  HPI: Lisa [name] is a 80 y.o. female with Past medical history of hypertension, diabetes, osteoarthritis, dyslipidemia. The patient is coming from home. The patient was brought in as she was started to having weakness on his right side. This happened around 10:00 in the morning she mentions when she woke up there were no weakness and she was at her baseline. She started having difficulty controlling her right hand initially. Later on around often on she started having difficulty ambulating with the right leg and was dragging her right leg when she was walking and therefore her friend recommended her to go to the hospital. She denies any dizziness, trauma, fever, chills, cough, chest pain, palpitation, focal neurological deficit anywhere as, diarrhea, vomiting, constipation, burning urination, active bleeding, leg swelling orthopnea PND. She mentions she is compliant with her medication and takes her blood pressure measurement on a regular basis and her blood pressure has been ranging good. She mentions she's not taking aspirin on a regular basis and does not remember the last time she took are aspirin.   Review of Systems: as mentioned in the history of present illness.  A Comprehensive review of the other systems is negative.  Past Medical History  Diagnosis Date  . Unspecified vitamin D deficiency   . Regional enteritis of unspecified site   . Hyperlipidemia LDL goal < 100   . Osteoarthrosis, unspecified whether generalized or localized, lower leg   . Dermatophytosis of groin and perianal area   . Cervicalgia   . Insomnia, unspecified   . Chronic kidney disease, stage III (moderate)   . Type II or unspecified type diabetes mellitus with renal  manifestations, not stated as uncontrolled   . Other abnormal glucose   . Other B-complex deficiencies   . Other malaise and fatigue   . Senile cataract, unspecified   . Unspecified essential hypertension   . Muscle weakness (generalized)   . Tachycardia, unspecified   . Palpitations   . Undiagnosed cardiac murmurs   . Shortness of breath    Past Surgical History  Procedure Laterality Date  . Tonsillectomy  1944  . Tubal ligation  1951  . Toe amputation  2002  . Eye surgery     Social History:  reports that she has never smoked. She does not have any smokeless tobacco history on file. She reports that she does not drink alcohol or use illicit drugs. Independent for most of her  ADL.  No Known Allergies  No family history on file.  Prior to Admission medications   Medication Sig Start Date End Date Taking? Authorizing Provider  aspirin 81 MG tablet Take 81 mg by mouth every evening.    Yes Historical Provider, MD  calcium-vitamin D (OSCAL WITH D) 500-200 MG-UNIT per tablet Take 1 tablet by mouth.   Yes Historical Provider, MD  carvedilol (COREG) 6.25 MG tablet Take 6.25 mg by mouth 2 (two) times daily with a meal.    Yes Historical Provider, MD  cholecalciferol (VITAMIN D) 1000 UNITS tablet Take 1,000 Units by mouth daily.   Yes Historical Provider, MD  cyanocobalamin 1000 MCG tablet Take 100 mcg by mouth daily.   Yes Historical Provider, MD  hydrochlorothiazide (HYDRODIURIL) 25 MG tablet Take 25 mg by mouth daily as needed. Swelling  Yes Historical Provider, MD  traMADol (ULTRAM) 50 MG tablet Take 1 tablet (50 mg total) by mouth daily as needed for pain. Take one tablet twice daily as needed for neck pain 09/12/12  Yes Tiffany L Reed, DO  zolpidem (AMBIEN) 5 MG tablet Take 1 tablet (5 mg total) by mouth at bedtime as needed for sleep. 09/12/12  Yes Kermit Baloiffany L Reed, DO    Physical Exam: Filed Vitals:   04/09/13 1627 04/09/13 1742 04/09/13 1800  BP: 196/158 167/63 161/70  Pulse:  72 61 59  Temp: 97.9 F (36.6 C)    TempSrc: Oral    Resp: 21 17 17   SpO2: 99% 98% 97%    General: Alert, Awake and Oriented to Time, Place and Person. Appear in mild distress Eyes: PERRL ENT: Oral Mucosa clear moist., Right angle of the mouth droop Neck: No JVD Cardiovascular: S1 and S2 Present, aortic systolic Murmur, Peripheral Pulses Present Respiratory: Bilateral Air entry equal and Decreased, Clear to Auscultation,  No Crackles, no wheezes Abdomen: Bowel Sound Present, Soft and Non tender Skin: No Rash Extremities: No Pedal edema, no calf tenderness Neurologic: Mental status alert awake oriented, appropriate speech,, Cranial Nerves pupils are reactive, extraocular muscle movement intact but occasionally incoordinated, Motor strength right-sided weakness 3/5 left extremity 5 out of 5, Sensation intact to light touch, reflexes intact, babinski negative, Cerebellar test normal finger-nose-finger and heel-to-shin.  Labs on Admission:  CBC:  Recent Labs Lab 04/09/13 1648 04/09/13 1658  WBC 6.3  --   NEUTROABS 4.0  --   HGB 14.0 14.6  HCT 41.6 43.0  MCV 88.3  --   PLT 184  --     CMP     Component Value Date/Time   NA 141 04/09/2013 1658   NA 141 09/10/2012 0823   K 4.8 04/09/2013 1658   CL 104 04/09/2013 1658   CO2 23 09/10/2012 0823   GLUCOSE 121* 04/09/2013 1658   GLUCOSE 115* 09/10/2012 0823   BUN 36* 04/09/2013 1658   BUN 26 09/10/2012 0823   CREATININE 1.10 04/09/2013 1658   CALCIUM 9.1 09/10/2012 0823   GFRNONAA 39* 09/10/2012 0823   GFRAA 45* 09/10/2012 0823    No results found for this basename: LIPASE, AMYLASE,  in the last 168 hours No results found for this basename: AMMONIA,  in the last 168 hours  No results found for this basename: CKTOTAL, CKMB, CKMBINDEX, TROPONINI,  in the last 168 hours BNP (last 3 results) No results found for this basename: PROBNP,  in the last 8760 hours  Radiological Exams on Admission: Dg Chest 2 View  04/09/2013   CLINICAL  DATA:  Stroke.  Chronic short of breath.  EXAM: CHEST  2 VIEW  COMPARISON:  11/10/2008  FINDINGS: Mild cardiomegaly. Lungs are under aerated. Normal vascularity. No pneumothorax. No pleural effusion. No pneumothorax.  IMPRESSION: Cardiomegaly without decompensation.   Electronically Signed   By: Maryclare BeanArt  Hoss M.D.   On: 04/09/2013 17:28   Ct Head Wo Contrast  04/09/2013   CLINICAL DATA:  Right arm and hand weakness.  EXAM: CT HEAD WITHOUT CONTRAST  TECHNIQUE: Contiguous axial images were obtained from the base of the skull through the vertex without intravenous contrast.  COMPARISON:  CT 02/13/2006  FINDINGS: Mild atrophy. Mild chronic microvascular ischemic change in the white matter. No acute infarct. Negative for hemorrhage or mass. No acute skull abnormality.  IMPRESSION: No acute abnormality.   Electronically Signed   By: Marlan Palauharles  Clark M.D.  On: 04/09/2013 17:16    EKG: Independently reviewed. nonspecific ST and T waves changes.  Assessment/Plan Principal Problem:   CVA (cerebral vascular accident) Active Problems:   HYPERTENSION   Type II or unspecified type diabetes mellitus with renal manifestations, not stated as uncontrolled   Cervicalgia   Hyperlipidemia LDL goal < 100   1. CVA (cerebral vascular accident) The patient is presenting with right-sided weakness and right-sided angle of the mouth drop. Her initial CT scan is negative. The rest of the lab work also does not show any acute abnormality other than diffuse T wave inversions on her EKG which are unchanged from her prior EKG 2007. She has some residual weakness at present but she is out of the window period since her symptoms started at 10:00 in the morning. Currently she will be admitted for observation under telemetry. PTOT and speech therapy evaluation. Since she has not been taking her aspirin regularly I will put her on 325 mg aspirin. Check lipid profile hemoglobin A1c, MRI, carotid Doppler, 2-D echo. Serial neuro checks  and telemetry.  2. Accelerated hypertension Her blood pressure is significantly elevated. I would resume her antihypertensive from tomorrow. Due to presence of heart rate in 60s sinus I will give her when necessary hydralazine overnight to control her blood pressure acutely of 180 mm hg. Holding hydrochlorothiazide.  3. Cervicalgia Continue tramadol  Consults: Neurology appreciate input  DVT Prophylaxis: subcutaneous Heparin Nutrition: Cardiac diet  Code Status: DNR/DNI  Family Communication: Family friend was present at bedside, opportunity was given to ask question and all questions were answered satisfactorily at the time of interview. Disposition: Admitted to observation in telemetry unit.  Author: Lynden Oxford, MD [facility] Pager: [phone] 04/09/2013, 7:35 PM    If 7PM-7AM, please contact night-coverage [email or web address] Password [email or web address]

## 2013-04-09 NOTE — ED Notes (Signed)
EDP Plunkett to see pt upon arrival to room

## 2013-04-09 NOTE — Consult Note (Signed)
Referring Physician: Dr. Anitra LauthPlunkett    Chief Complaint: New-onset right-sided weakness.  HPI: Lisa [name] is an 80 y.o. female history of hypertension and hyperlipidemia who developed acute weakness involving right arm and leg at 10 AM today. Patient's voice was a low-volume in speech was slow but not particularly slurred. Facial drooping was noted, however. Patient has no history of stroke nor TIA. She has not been taking antiplatelet therapy on a daily basis. CT scan of her head showed no acute intracranial abnormality. Deficits have improved and patient has no complaints at this point. NIH stroke score was 0.  LSN: 10 AM on 04/09/2013 tPA Given: No: Beyond time window for TPA consideration arrived in the ER; rapidly resolving deficits. MRankin: 0  Past Medical History  Diagnosis Date  . Unspecified vitamin D deficiency   . Regional enteritis of unspecified site   . Hyperlipidemia LDL goal < 100   . Osteoarthrosis, unspecified whether generalized or localized, lower leg   . Dermatophytosis of groin and perianal area   . Cervicalgia   . Insomnia, unspecified   . Chronic kidney disease, stage III (moderate)   . Type II or unspecified type diabetes mellitus with renal manifestations, not stated as uncontrolled   . Other abnormal glucose   . Other B-complex deficiencies   . Other malaise and fatigue   . Senile cataract, unspecified   . Unspecified essential hypertension   . Muscle weakness (generalized)   . Tachycardia, unspecified   . Palpitations   . Undiagnosed cardiac murmurs   . Shortness of breath     No family history on file.   Medications: I have reviewed the patient's current medications.  ROS: History obtained from the patient  General ROS: negative for - chills, fatigue, fever, night sweats, weight gain or weight loss Psychological ROS: negative for - behavioral disorder, hallucinations, memory difficulties, mood swings or suicidal ideation Ophthalmic ROS:  negative for - blurry vision, double vision, eye pain or loss of vision ENT ROS: negative for - epistaxis, nasal discharge, oral lesions, sore throat, tinnitus or vertigo Allergy and Immunology ROS: negative for - hives or itchy/watery eyes Hematological and Lymphatic ROS: negative for - bleeding problems, bruising or swollen lymph nodes Endocrine ROS: negative for - galactorrhea, hair pattern changes, polydipsia/polyuria or temperature intolerance Respiratory ROS: negative for - cough, hemoptysis, shortness of breath or wheezing Cardiovascular ROS: negative for - chest pain, dyspnea on exertion, edema or irregular heartbeat Gastrointestinal ROS: negative for - abdominal pain, diarrhea, hematemesis, nausea/vomiting or stool incontinence Genito-Urinary ROS: negative for - dysuria, hematuria, incontinence or urinary frequency/urgency Musculoskeletal ROS: negative for - joint swelling or muscular weakness Neurological ROS: as noted in HPI Dermatological ROS: negative for rash and skin lesion changes  Physical Examination: Blood pressure 191/89, pulse 63, temperature 97.9 F (36.6 C), temperature source Oral, resp. rate 20, SpO2 98.00%.  Neurologic Examination: Mental Status: Alert, oriented, thought content appropriate.  Speech fluent without evidence of aphasia. Able to follow commands without difficulty. Cranial Nerves: II-Visual fields were normal. III/IV/VI-Pupils were equal and reacted. Extraocular movements were full and conjugate.    V/VII-no facial numbness and no facial weakness. VIII-normal. X-normal speech and symmetrical palatal movement. Motor: 5/5 bilaterally with normal tone and bulk Sensory: Normal throughout. Deep Tendon Reflexes: 2+ and symmetric. Plantars: Flexor on the right; amputated great toe on left. Cerebellar: Normal finger-to-nose testing. Carotid auscultation: Normal  Dg Chest 2 View  04/09/2013   CLINICAL DATA:  Stroke.  Chronic short of breath.  EXAM:  CHEST  2 VIEW  COMPARISON:  11/10/2008  FINDINGS: Mild cardiomegaly. Lungs are under aerated. Normal vascularity. No pneumothorax. No pleural effusion. No pneumothorax.  IMPRESSION: Cardiomegaly without decompensation.   Electronically Signed   By: Maryclare Bean M.D.   On: 04/09/2013 17:28   Ct Head Wo Contrast  04/09/2013   CLINICAL DATA:  Right arm and hand weakness.  EXAM: CT HEAD WITHOUT CONTRAST  TECHNIQUE: Contiguous axial images were obtained from the base of the skull through the vertex without intravenous contrast.  COMPARISON:  CT 02/13/2006  FINDINGS: Mild atrophy. Mild chronic microvascular ischemic change in the white matter. No acute infarct. Negative for hemorrhage or mass. No acute skull abnormality.  IMPRESSION: No acute abnormality.   Electronically Signed   By: Marlan Palau M.D.   On: 04/09/2013 17:16    Assessment: 80 y.o. female presenting with transient weakness involving right arm and right leg, likely cortical left TIA. However, small vessel subcortical stroke cannot be ruled out.  Stroke Risk Factors - family history, hyperlipidemia and hypertension  Plan: 1. HgbA1c, fasting lipid panel 2. MRI, MRA  of the brain without contrast 3. PT consult, OT consult 4. Echocardiogram 5. Carotid dopplers 6. Prophylactic therapy-Antiplatelet med: Aspirin 325 mg per day 7. Risk factor modification 8. Telemetry monitoring   C.R. Roseanne Reno, MD [facility] [phone]  04/09/2013, 8:21 PM

## 2013-04-09 NOTE — ED Provider Notes (Signed)
CSN: [id number]     Arrival date & time 04/09/13  1615 History   First MD Initiated Contact with Patient 04/09/13 1618     No chief complaint on file.  (Consider location/radiation/quality/duration/timing/severity/associated sxs/prior Treatment) HPI Comments: Pt states after sx started she took her BP and it was 140's systolic.  However after she fell it was in the 180's.  She has taken her coreg today and denies missing any doses recently.  Patient is a 80 y.o. female presenting with extremity weakness. The history is provided by the patient.  Extremity Weakness This is a new problem. Episode onset: 6.5 hours ago. The problem occurs constantly. The problem has not changed since onset.Pertinent negatives include no chest pain, no abdominal pain, no headaches and no shortness of breath. Associated symptoms comments: States appx 10am this morning developed weakness in the right arm and leg.  Not improving and caused her to fall in her kitchen 1 hour pta.  She fell backwards because her right foot was weak and hit her back on the cabinets in her kitchen but denies hitting her head or LOC.  Normal speech today and no vision problems. The symptoms are aggravated by walking. Nothing relieves the symptoms. She has tried nothing for the symptoms. The treatment provided no relief.    Past Medical History  Diagnosis Date  . Unspecified vitamin D deficiency   . Regional enteritis of unspecified site   . Hyperlipidemia LDL goal < 100   . Osteoarthrosis, unspecified whether generalized or localized, lower leg   . Dermatophytosis of groin and perianal area   . Cervicalgia   . Insomnia, unspecified   . Chronic kidney disease, stage III (moderate)   . Type II or unspecified type diabetes mellitus with renal manifestations, not stated as uncontrolled(250.40)   . Other abnormal glucose   . Other B-complex deficiencies   . Other malaise and fatigue   . Senile cataract, unspecified   . Unspecified essential  hypertension   . Muscle weakness (generalized)   . Tachycardia, unspecified   . Palpitations   . Undiagnosed cardiac murmurs   . Shortness of breath    Past Surgical History  Procedure Laterality Date  . Tonsillectomy  1944  . Tubal ligation  1951  . Toe amputation  2002  . Eye surgery     No family history on file. History  Substance Use Topics  . Smoking status: Never Smoker   . Smokeless tobacco: Not on file  . Alcohol Use: No   OB History   Grav Para Term Preterm Abortions TAB SAB Ect Mult Living                 Review of Systems  Constitutional: Negative for fever.  Respiratory: Negative for cough and shortness of breath.   Cardiovascular: Negative for chest pain.  Gastrointestinal: Negative for abdominal pain.  Musculoskeletal: Positive for extremity weakness.  Neurological: Positive for weakness and numbness. Negative for dizziness, syncope, speech difficulty and headaches.  All other systems reviewed and are negative.    Allergies  Review of patient's allergies indicates no known allergies.  Home Medications   Current Outpatient Rx  Name  Route  Sig  Dispense  Refill  . aspirin 81 MG tablet   Oral   Take 81 mg by mouth daily.         Marland Kitchen azithromycin (ZITHROMAX) 250 MG tablet      2 tabs today, then one tab daily for 4 more days.  6 tablet   0   . calcium-vitamin D (OSCAL WITH D) 500-200 MG-UNIT per tablet   Oral   Take 1 tablet by mouth.         . carvedilol (COREG) 6.25 MG tablet      Take one tablet by mouth twice daily for blood pressure         . cyanocobalamin 1000 MCG tablet   Oral   Take 100 mcg by mouth daily.         . hydrochlorothiazide (HYDRODIURIL) 25 MG tablet      Take one tablet by mouth once daily as needed         . traMADol (ULTRAM) 50 MG tablet   Oral   Take 1 tablet (50 mg total) by mouth daily as needed for pain. Take one tablet twice daily as needed for neck pain   30 tablet   3   . Vitamin D,  Ergocalciferol, (DRISDOL) 50000 UNITS CAPS      50,000 Units. Take one capsule by mouth weekly for vitamin D         . zolpidem (AMBIEN) 5 MG tablet   Oral   Take 1 tablet (5 mg total) by mouth at bedtime as needed for sleep.   30 tablet   3    BP 196/158  Pulse 72  Temp(Src) 97.9 F (36.6 C) (Oral)  Resp 21  SpO2 99% Physical Exam  Nursing note and vitals reviewed. Constitutional: She is oriented to person, place, and time. She appears well-developed and well-nourished. No distress.  HENT:  Head: Normocephalic and atraumatic.  Mouth/Throat: Oropharynx is clear and moist.  Eyes: Conjunctivae and EOM are normal. Pupils are equal, round, and reactive to light.  Neck: Normal range of motion. Neck supple. Carotid bruit is not present.  Cardiovascular: Normal rate, regular rhythm and intact distal pulses.   No murmur heard. Pulmonary/Chest: Effort normal and breath sounds normal. No respiratory distress. She has no wheezes. She has no rales.  Abdominal: Soft. She exhibits no distension. There is no tenderness. There is no rebound and no guarding.  Musculoskeletal: Normal range of motion. She exhibits no edema and no tenderness.  Neurological: She is alert and oriented to person, place, and time. She has normal strength. A cranial nerve deficit and sensory deficit is present. Coordination normal.  Decreased sensation in the right face, upper/lower ext.  Only minimal decreased strength in the right hand grip and lower ext.  No visual field cuts  Skin: Skin is warm and dry. No rash noted. No erythema.  Psychiatric: She has a normal mood and affect. Her behavior is normal.    ED Course  Procedures (including critical care time) Labs Review Labs Reviewed  POCT I-STAT, CHEM 8 - Abnormal; Notable for the following:    BUN 36 (*)    Glucose, Bld 121 (*)    All other components within normal limits  CBC WITH DIFFERENTIAL  PROTIME-INR  APTT  POCT I-STAT TROPONIN I   Imaging  Review Dg Chest 2 View  04/09/2013   CLINICAL DATA:  Stroke.  Chronic short of breath.  EXAM: CHEST  2 VIEW  COMPARISON:  11/10/2008  FINDINGS: Mild cardiomegaly. Lungs are under aerated. Normal vascularity. No pneumothorax. No pleural effusion. No pneumothorax.  IMPRESSION: Cardiomegaly without decompensation.   Electronically Signed   By: Maryclare Bean M.D.   On: 04/09/2013 17:28   Ct Head Wo Contrast  04/09/2013   CLINICAL DATA:  Right arm  and hand weakness.  EXAM: CT HEAD WITHOUT CONTRAST  TECHNIQUE: Contiguous axial images were obtained from the base of the skull through the vertex without intravenous contrast.  COMPARISON:  CT 02/13/2006  FINDINGS: Mild atrophy. Mild chronic microvascular ischemic change in the white matter. No acute infarct. Negative for hemorrhage or mass. No acute skull abnormality.  IMPRESSION: No acute abnormality.   Electronically Signed   By: Marlan Palauharles  Clark M.D.   On: 04/09/2013 17:16    EKG Interpretation    Date/Time:  Wednesday April 09 2013 16:27:16 EST Ventricular Rate:  68 PR Interval:  197 QRS Duration: 142 QT Interval:  436 QTC Calculation: 464 R Axis:   52 Text Interpretation:  Sinus rhythm Right bundle branch block Abnormal T, consider ischemia, lateral leads Baseline wander in lead(s) V2 V5 No significant change since last tracing Confirmed by Anitra LauthPLUNKETT  MD, Stevenson Windmiller ([id number]) on 04/09/2013 5:00:53 PM            MDM  No diagnosis found.  Patient presenting with stroke like symptoms this started approximately 10 AM this morning. Upon arrival patient is 6-1/2 hours out and HTN and a code stroke was not called as she is not a candidate for IR.  On exam patient has mild sensory deficit in the right face, upper and lower extremity. Mild motor weakness in the same areas. No notable facial droop, slurred speech or vision changes. Patient is hypertensive here 196/158 and she did take her Coreg this morning. Patient had a fall approximately one hour prior to  arrival because of the weakness in her right leg. She denies head injury or loss of consciousness and had a mild scrape on her back where she hit the cabinet but denies Chest pain or back pain or SOB at this time.  Concern for stroke versus hypertensive emergency however patient states earlier her blood pressure after symptom onset was in the 140s systolic.  Stroke workup initiated  6:29 PM Swallow screen is negative. CT initial labs are within normal limits. Spoke with neurology who recommends patient being transferred to come for further care. Patient's repeat blood pressure 161/70. Will discuss with the hospitalist.  Gwyneth SproutWhitney Ohm Dentler, MD 04/09/13 [phone]

## 2013-04-10 ENCOUNTER — Inpatient Hospital Stay (HOSPITAL_COMMUNITY): Payer: Medicare Other

## 2013-04-10 ENCOUNTER — Telehealth: Payer: Self-pay | Admitting: *Deleted

## 2013-04-10 DIAGNOSIS — I517 Cardiomegaly: Secondary | ICD-10-CM

## 2013-04-10 LAB — LIPID PANEL
Cholesterol: 195 mg/dL (ref 0–200)
HDL: 25 mg/dL — ABNORMAL LOW (ref >39)
LDL CALC: 122 mg/dL — AB (ref 0–99)
Total CHOL/HDL Ratio: 7.8 RATIO
Triglycerides: 241 mg/dL — ABNORMAL HIGH (ref <150)
VLDL: 48 mg/dL — AB (ref 0–40)

## 2013-04-10 LAB — HEMOGLOBIN A1C
Hgb A1c MFr Bld: 6.4 % — ABNORMAL HIGH (ref <5.7)
Mean Plasma Glucose: 137 mg/dL — ABNORMAL HIGH (ref <117)

## 2013-04-10 MED ORDER — ATORVASTATIN CALCIUM 10 MG PO TABS
10.0000 mg | ORAL_TABLET | Freq: Every day | ORAL | Status: DC
  Filled 2013-04-10: qty 1

## 2013-04-10 NOTE — Progress Notes (Signed)
Utilization review completed. Kel Senn, RN, BSN. 

## 2013-04-10 NOTE — Progress Notes (Signed)
Stroke Team Progress Note  HISTORY Lisa [name] is an 80 y.o. female history of hypertension and hyperlipidemia who developed acute weakness involving right arm and leg at 10 AM today 04/09/2013. Patient's voice was a low-volume in speech was slow but not particularly slurred. Facial drooping was noted, however. Patient has no history of stroke nor TIA. She has not been taking antiplatelet therapy on a daily basis. CT scan of her head showed no acute intracranial abnormality. Deficits have improved and patient has no complaints at this point. NIH stroke score was 0. Patient was not administerd TPA secondary to Beyond time window for TPA consideration arrived in the ER; rapidly resolving deficits. She was admitted for further evaluation and treatment.  SUBJECTIVE There was a female is at the bedside, relation not clear.  Overall she feels her condition is gradually improving.   OBJECTIVE Most recent Vital Signs: Filed Vitals:   04/09/13 2200 04/10/13 0000 04/10/13 0200 04/10/13 0400  BP: 183/60 150/60 142/77 138/70  Pulse: 74 76 71 76  Temp: 98.1 F (36.7 C) 98.6 F (37 C) 98.3 F (36.8 C) 98.2 F (36.8 C)  TempSrc: Oral Oral Oral Oral  Resp: 18 18 18 18   Height: 5\' 7"  (1.702 m)     Weight: 72.621 kg (160 lb 1.6 oz)     SpO2: 98% 97% 98% 99%   CBG (last 3)  No results found for this basename: GLUCAP,  in the last 72 hours  IV Fluid Intake:     MEDICATIONS  . aspirin  300 mg Rectal Daily   Or  . aspirin  325 mg Oral Daily  . carvedilol  6.25 mg Oral BID WC  . heparin  5,000 Units Subcutaneous Q8H   PRN:  hydrALAZINE, senna-docusate, traMADol, zolpidem  Diet:  Cardiac thin liquids Activity:  Bedrest with Bathroom privileges DVT Prophylaxis:  Heparin 5000 units sq tid   CLINICALLY SIGNIFICANT STUDIES Basic Metabolic Panel:  Recent Labs Lab 04/09/13 1658  NA 141  K 4.8  CL 104  GLUCOSE 121*  BUN 36*  CREATININE 1.10   Liver Function Tests: No results found for  this basename: AST, ALT, ALKPHOS, BILITOT, PROT, ALBUMIN,  in the last 168 hours CBC:  Recent Labs Lab 04/09/13 1648 04/09/13 1658  WBC 6.3  --   NEUTROABS 4.0  --   HGB 14.0 14.6  HCT 41.6 43.0  MCV 88.3  --   PLT 184  --    Coagulation:  Recent Labs Lab 04/09/13 1648  LABPROT 13.8  INR 1.08   Cardiac Enzymes: No results found for this basename: CKTOTAL, CKMB, CKMBINDEX, TROPONINI,  in the last 168 hours Urinalysis: No results found for this basename: COLORURINE, APPERANCEUR, LABSPEC, PHURINE, GLUCOSEU, HGBUR, BILIRUBINUR, KETONESUR, PROTEINUR, UROBILINOGEN, NITRITE, LEUKOCYTESUR,  in the last 168 hours Lipid Panel    Component Value Date/Time   CHOL 195 04/10/2013 0700   TRIG 241* 04/10/2013 0700   HDL 25* 04/10/2013 0700   HDL 25* 09/10/2012 0823   CHOLHDL 7.8 04/10/2013 0700   CHOLHDL 7.5* 09/10/2012 0823   VLDL 48* 04/10/2013 0700   LDLCALC 122* 04/10/2013 0700   LDLCALC 114* 09/10/2012 0823   HgbA1C  Lab Results  Component Value Date   HGBA1C 6.2* 09/10/2012    Urine Drug Screen:   No results found for this basename: labopia, cocainscrnur, labbenz, amphetmu, thcu, labbarb    Alcohol Level: No results found for this basename: ETH,  in the last 168 hours   CT  of the brain  04/09/2013   No acute abnormality.     MRI of the brain    MRA of the brain    2D Echocardiogram    Carotid Doppler  No evidence of hemodynamically significant internal carotid artery stenosis. Vertebral artery flow is antegrade.   CXR  04/09/2013   Cardiomegaly without decompensation.     EKG  normal sinus rhythm. For complete results please see formal report.   Therapy Recommendations   Physical Exam   pleasant elderly lady not in distress.Awake alert. Afebrile. Head is nontraumatic. Neck is supple without bruit. Hearing is diminished Cardiac exam no murmur or gallop. Lungs are clear to auscultation. Distal pulses are well felt. Neurological Exam : Awake alert oriented x 3 normal speech  and language. Mild right lower face asymmetry. Tongue midline. No drift. Mild diminished fine finger movements on right. Orbits left   over right upper extremity. Mild right grip weak.. Normal sensation . Normal coordination. ASSESSMENT [name] is a 80 y.o. female presenting with new onset right sided weakness. Imaging pending. Suspect a left brain infarct. Infarct etiology unknown at this time.  On aspirin 81 mg orally every day prior to admission. Now on aspirin 325 mg orally every day for secondary stroke prevention. Patient with resultant right hemiparesis. Work up underway.  Hyperlipidemia, LDL 122 on no statin prior to admission, now on no statin, goal LDL < 100 (< 70 for diabetics) Diabetes, HgbA1c pending, goal < 7.0 Hypertension  Hospital day # 1  TREATMENT/PLAN  Continue aspirin 325 mg orally every day for secondary stroke prevention.  Add statin  F/u MRI, MRA, 2D and carotid doppler  OOB, therapy evals  Dr. Pearlean BrownieSethi discussed diagnosis, prognosis,  treatment options and plan of care with lady at bedside and patient.    Lisa MainSHARON BIBY, MSN, RN, ANVP-BC, ANP-BC, Lawernce IonGNP-BC [facility] Pager: [phone] 04/10/2013 10:32 AM  I have personally obtained a history, examined the patient, evaluated imaging results, and formulated the assessment and plan of care. I agree with the above. Delia HeadyPramod Rena Hunke, MD

## 2013-04-10 NOTE — Evaluation (Addendum)
Physical Therapy Evaluation Patient Details Name: Lisa [name] MRN: [id number] DOB: 06/30/1921 Today's Date: 04/10/2013 Time: [id number] PT Time Calculation (min): 16 min  PT Assessment / Plan / Recommendation History of Present Illness  80 y.o. [facility] with Past medical history of hypertension, diabetes, osteoarthritis, dyslipidemia.  She presents with right sided weakness and difficulty coordinating her right leg.  She fell as a result and scratched the right side of her back.  MRI revealed an acute Left infarct corona radiata.   Clinical Impression  Pt is moving well, but does demonstrate some increased gait instability.  I recommended she start using her cane and get some home therapy f/u for balance.   PT to follow acutely for deficits listed below.   PT to practice stairs with cane tomorrow with pt.      PT Assessment  Patient needs continued PT services    Follow Up Recommendations  Home health PT;Supervision - Intermittent    Does the patient have the potential to tolerate intense rehabilitation      NA  Barriers to Discharge   None      Equipment Recommendations  None recommended by PT (advised pt to use her cane)    Recommendations for Other Services   None  Frequency Min 4X/week    Precautions / Restrictions Precautions Precautions: Fall   Pertinent Vitals/Pain See vitals flow sheet.      Mobility  Bed Mobility Overal bed mobility: Modified Independent Transfers Overall transfer level: Modified independent Equipment used: None General transfer comment: uncontrolled "crash" descent to recliner chair.  Ambulation/Gait Ambulation/Gait assistance: Supervision Ambulation Distance (Feet): 300 Feet Assistive device: Straight cane;None Gait Pattern/deviations: Step-through pattern;Staggering right Gait velocity: decreased Gait velocity interpretation: Below normal speed for age/gender General Gait Details: pt walks slowly and  cautiously (per pt she walks slowly at baseline) and staggers to the right when multi tasking.  Son reports her stagger is more significant than PTA.   Modified Rankin (Stroke Patients Only) Pre-Morbid Rankin Score: No symptoms Modified Rankin: Moderate disability        PT Diagnosis: Difficulty walking;Abnormality of gait;Generalized weakness  PT Problem List: Decreased strength;Decreased activity tolerance;Decreased balance;Decreased mobility;Decreased knowledge of use of DME PT Treatment Interventions: DME instruction;Gait training;Stair training;Therapeutic activities;Functional mobility training;Therapeutic exercise;Balance training;Neuromuscular re-education;Patient/family education     PT Goals(Current goals can be found in the care plan section) Acute Rehab PT Goals Patient Stated Goal: to go home PT Goal Formulation: With patient Time For Goal Achievement: 04/24/13 Potential to Achieve Goals: Good  Visit Information  Last PT Received On: 04/10/13 Assistance Needed: +1 History of Present Illness: 80 y.o. female [facility] with Past medical history of hypertension, diabetes, osteoarthritis, dyslipidemia.  She presents with right sided weakness and difficulty coordinating her right leg.  She fell as a result and scratched the right side of her back.        Prior Functioning  Home Living Family/patient expects to be discharged to:: Private residence Living Arrangements: Alone Available Help at Discharge: Family;Available 24 hours/day (through the weekend only) Type of Home: House Home Access: Stairs to enter Entergy CorporationEntrance Stairs-Number of Steps: 2 (1 steps, walk, another step) Entrance Stairs-Rails: None Home Layout: One level Home Equipment: Cane - single point Prior Function Level of Independence: Independent Comments: Pt drives, goes to church, does her own house cleaning.  She has a cane, but does not use it.   Communication Communication: No  difficulties Dominant Hand: Right  Cognition  Cognition Arousal/Alertness: Awake/alert Behavior During Therapy: WFL for tasks assessed/performed Overall Cognitive Status: Within Functional Limits for tasks assessed    Extremity/Trunk Assessment Upper Extremity Assessment Upper Extremity Assessment: Defer to OT evaluation Lower Extremity Assessment Lower Extremity Assessment: Overall WFL for tasks assessed Cervical / Trunk Assessment Cervical / Trunk Assessment: Normal   Balance Balance Overall balance assessment: Needs assistance Sitting-balance support: No upper extremity supported;Feet supported Sitting balance-Leahy Scale: Good Standing balance support: Single extremity supported Standing balance-Leahy Scale: Good High level balance activites: Direction changes;Turns;Head turns High Level Balance Comments: Needs close supervision with cane as she staggers to the right.   Standardized Balance Assessment Standardized Balance Assessment : Dynamic Gait Index Dynamic Gait Index Level Surface: Mild Impairment Gait with Horizontal Head Turns: Moderate Impairment Gait with Vertical Head Turns: Mild Impairment Gait and Pivot Turn: Mild Impairment General Comments General comments (skin integrity, edema, etc.): Small scrape on her right upper back from fall at home   End of Session PT - End of Session Equipment Utilized During Treatment: Gait belt Activity Tolerance: Patient tolerated treatment well Patient left: in chair;with call bell/phone within reach;with family/visitor present Nurse Communication: Mobility status    Dorice Stiggers B. Jiaire Rosebrook, PT, DPT [phone]   04/10/2013, 4:01 PM

## 2013-04-10 NOTE — Telephone Encounter (Signed)
Patient left message on voicemail that she was having Right leg pain and dragging it. Fell. Wanted to know what to do. I called patient at the number left and it rang bust. I called this morning and No answer, called cell number on file and patient is in the hospital. She stated that she went ahead to the hospital last night and they kept her overnight and going to run test today. Patient will keep us informed and call if needs us. Agreed.

## 2013-04-10 NOTE — Progress Notes (Signed)
[facility] PROGRESS NOTE  Lisa [name] ZOX:[id number] DOB: Dec 10, 1921 DOA: 04/09/2013 PCP: Bufford Spikes, DO  Assessment/Plan: 1-Acute  Left infarct corona radiata. She was not taking aspirin.  Continue with aspirin.  HB A1c 6.4.  LDL at 122, triglycerides at 241. Need cholesterol medications. She feel think about it.  ECHO no cardiac source of emboli.  Doppler :  1-39% ICAstenosis Neuro following.  PT evaluation pending.   2-Accelerated hypertension; continue with coreg. Permissive HTN in setting acute stroke.    Code Status: DNR.  Family Communication: care discussed with multiple family member at bedside.  Disposition Plan: remain inpatient.    Consultants:  Neurology.   Procedures: ECHO; Left ventricle: The cavity size was normal. Wall thickness was increased in a pattern of mild LVH. Systolic function was normal. The estimated ejection fraction was in the range of 60% to 65%. Wall motion was normal; there were no regional wall motion abnormalities. Doppler parameters are consistent with abnormal left ventricular relaxation (grade 1 diastolic dysfunction).   Carotid doppler; Right: intimal wall thickening CCA. moderate soft plaque origin ECA. Moderate soft plaque proximal ICA. 1-39% ICA stenosis. Vertebral artery flow is atypical. Left: intimal wall thickening CCA. Mild to moderate calcific plaque origin ICA and ECA with acoustic shadowing. 1-39% ICA stenosis. Vertebral artery flow is antegrade.    Antibiotics:  none  HPI/Subjective: Feeling well. Weakness improved.  She is considering about taking medications for cholesterol. Will give information about sides effect.   Objective: Filed Vitals:   04/10/13 0810  BP: 167/66  Pulse: 76  Temp: 97.8 F (36.6 C)  Resp: 16    Intake/Output Summary (Last 24 hours) at 04/10/13 1426 Last data filed at 04/10/13 0830  Gross per 24 hour  Intake    390 ml  Output      0 ml  Net    390 ml    Filed Weights   04/09/13 2200  Weight: 72.621 kg (160 lb 1.6 oz)    Exam:   General:  No distress.   Cardiovascular: S 1, S 2 RRR  Respiratory: CTA  Abdomen: BS present, soft, NT  Musculoskeletal: no edema.  Neuro; good motor strengthen. Alert oriented.   Data Reviewed: Basic Metabolic Panel:  Recent Labs Lab 04/09/13 1658  NA 141  K 4.8  CL 104  GLUCOSE 121*  BUN 36*  CREATININE 1.10   Liver Function Tests: No results found for this basename: AST, ALT, ALKPHOS, BILITOT, PROT, ALBUMIN,  in the last 168 hours No results found for this basename: LIPASE, AMYLASE,  in the last 168 hours No results found for this basename: AMMONIA,  in the last 168 hours CBC:  Recent Labs Lab 04/09/13 1648 04/09/13 1658  WBC 6.3  --   NEUTROABS 4.0  --   HGB 14.0 14.6  HCT 41.6 43.0  MCV 88.3  --   PLT 184  --    Cardiac Enzymes: No results found for this basename: CKTOTAL, CKMB, CKMBINDEX, TROPONINI,  in the last 168 hours BNP (last 3 results) No results found for this basename: PROBNP,  in the last 8760 hours CBG: No results found for this basename: GLUCAP,  in the last 168 hours  No results found for this or any previous visit (from the past 240 hour(s)).   Studies: Dg Chest 2 View  04/09/2013   CLINICAL DATA:  Stroke.  Chronic short of breath.  EXAM: CHEST  2 VIEW  COMPARISON:  11/10/2008  FINDINGS: Mild cardiomegaly. Lungs  are under aerated. Normal vascularity. No pneumothorax. No pleural effusion. No pneumothorax.  IMPRESSION: Cardiomegaly without decompensation.   Electronically Signed   By: Maryclare BeanArt  Hoss M.D.   On: 04/09/2013 17:28   Ct Head Wo Contrast  04/09/2013   CLINICAL DATA:  Right arm and hand weakness.  EXAM: CT HEAD WITHOUT CONTRAST  TECHNIQUE: Contiguous axial images were obtained from the base of the skull through the vertex without intravenous contrast.  COMPARISON:  CT 02/13/2006  FINDINGS: Mild atrophy. Mild chronic microvascular ischemic change in the  white matter. No acute infarct. Negative for hemorrhage or mass. No acute skull abnormality.  IMPRESSION: No acute abnormality.   Electronically Signed   By: Marlan Palauharles  Clark M.D.   On: 04/09/2013 17:16   Mr Brain Wo Contrast  04/10/2013   CLINICAL DATA:  Right-sided weakness.  Stroke.  EXAM: MRI HEAD WITHOUT CONTRAST  MRA HEAD WITHOUT CONTRAST  TECHNIQUE: Multiplanar, multiecho pulse sequences of the brain and surrounding structures were obtained without intravenous contrast. Angiographic images of the head were obtained using MRA technique without contrast.  COMPARISON:  Head CT 04/09/2013  FINDINGS: MRI HEAD FINDINGS  There is a small acute infarct in the left corona radiata which extends inferiorly towards the posterior aspect of the left external capsule. There is no intracranial hemorrhage. Scattered foci of T2 hyperintensity within the subcortical and deep cerebral white matter and brainstem are compatible with mild chronic small vessel ischemic disease. Age-related cerebral atrophy is present. There is no mass, midline shift, or extra-axial fluid collection. Major intracranial vascular flow voids are unremarkable. Prior bilateral cataract surgery is noted. Visualized paranasal sinuses and mastoid air cells are clear.  MRA HEAD FINDINGS  The visualized distal vertebral arteries are patent. The right vertebral artery is hypoplastic. PICA origins are patent bilaterally. Basilar artery is small in caliber but patent and without focal stenosis. 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 SCA origins are patent bilaterally. The basilar artery is markedly hypoplastic distal to the right SCA origin. There are hypoplastic bilateral P1 segments with prominent bilateral posterior communicating arteries present. There is mild irregularity of distal PCA branches  bilaterally.  Internal carotid arteries are patent from skullbase to carotid termini. ACA and MCA origins and visualized branches are patent. There is mild right MCA and moderate left MCA irregularity including most prominent involvement of the left M1 segment. There is a small, patent anterior communicating artery. No intracranial aneurysm is identified.  IMPRESSION: 1. Small, acute infarct in the left corona radiata. 2. Mild chronic small vessel ischemic disease. 3. No evidence of major intracranial arterial occlusion or flow-limiting stenosis. Mild anterior and posterior circulation branch vessel irregularity and moderate irregular narrowing of the left M1 segment, consistent with intracranial atherosclerosis.   Electronically Signed   By: Sebastian AcheAllen  Grady   On: 04/10/2013 13:30   Mr Maxine GlennMra Head/brain Wo Cm  04/10/2013   CLINICAL DATA:  Right-sided weakness.  Stroke.  EXAM: MRI HEAD WITHOUT CONTRAST  MRA HEAD WITHOUT CONTRAST  TECHNIQUE: Multiplanar, multiecho pulse sequences of the brain and surrounding structures were obtained without intravenous contrast. Angiographic images of the head were obtained using MRA technique without contrast.  COMPARISON:  Head CT 04/09/2013  FINDINGS: MRI HEAD FINDINGS  There is a small acute infarct in the left corona radiata which extends inferiorly towards the posterior aspect of the left  external capsule. There is no intracranial hemorrhage. Scattered foci of T2 hyperintensity within the subcortical and deep cerebral white matter and brainstem are compatible with mild chronic small vessel ischemic disease. Age-related cerebral atrophy is present. There is no mass, midline shift, or extra-axial fluid collection. Major intracranial vascular flow voids are unremarkable. Prior bilateral cataract surgery is noted. Visualized paranasal sinuses and mastoid air cells are clear.  MRA HEAD FINDINGS  The visualized distal vertebral arteries are patent. The right vertebral artery is  hypoplastic. PICA origins are patent bilaterally. Basilar artery is small in caliber but patent and without focal stenosis. 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 SCA origins are patent bilaterally. The basilar artery is markedly hypoplastic distal to the right SCA origin. There are hypoplastic bilateral P1 segments with prominent bilateral posterior communicating arteries present. There is mild irregularity of distal PCA branches bilaterally.  Internal carotid arteries are patent from skullbase to carotid termini. ACA and MCA origins and visualized branches are patent. There is mild right MCA and moderate left MCA irregularity including most prominent involvement of the left M1 segment. There is a small, patent anterior communicating artery. No intracranial aneurysm is identified.  IMPRESSION: 1. Small, acute infarct in the left corona radiata. 2. Mild chronic small vessel ischemic disease. 3. No evidence of major intracranial arterial occlusion or flow-limiting stenosis. Mild anterior and posterior circulation branch vessel irregularity and moderate irregular narrowing of the left M1 segment, consistent with intracranial atherosclerosis.   Electronically Signed   By: Sebastian Ache   On: 04/10/2013 13:30    Scheduled Meds: . aspirin  300 mg Rectal Daily   Or  . aspirin  325 mg Oral Daily  . carvedilol  6.25 mg Oral BID WC  . heparin  5,000 Units Subcutaneous Q8H   Continuous Infusions:   Principal Problem:   CVA (cerebral vascular accident) Active Problems:   HYPERTENSION   Type II or unspecified type diabetes mellitus with renal manifestations, not stated as uncontrolled   Cervicalgia   Hyperlipidemia LDL goal < 100   Stroke    Time spent: 35 minutes.     Nevin Kozuch  [facility] Pager [phone]. If 7PM-7AM,  please contact night-coverage at [email or web address], password [facility] 04/10/2013, 2:26 PM  LOS: 1 day

## 2013-04-10 NOTE — Progress Notes (Signed)
Echo Lab  2D Echocardiogram completed.  Karolyna Bianchini L Laynie Espy, RDCS 04/10/2013 10:54 AM

## 2013-04-10 NOTE — Progress Notes (Signed)
VASCULAR LAB PRELIMINARY  PRELIMINARY  PRELIMINARY  PRELIMINARY  Carotid Dopplers completed.    Preliminary report:  1-39% ICA stenosis.  Vertebral artery flow is antegrade.  Keyion Knack, RVT 04/10/2013, 9:37 AM

## 2013-04-11 ENCOUNTER — Ambulatory Visit: Payer: Medicare Other | Admitting: Internal Medicine

## 2013-04-11 MED ORDER — HYDROCHLOROTHIAZIDE 25 MG PO TABS
25.0000 mg | ORAL_TABLET | Freq: Every day | ORAL | Status: DC
  Filled 2013-04-11: qty 1

## 2013-04-11 MED ORDER — CLOPIDOGREL BISULFATE 75 MG PO TABS: 75.0000 mg | ORAL_TABLET | Freq: Every day | ORAL | Status: DC

## 2013-04-11 MED ORDER — CLOPIDOGREL BISULFATE 75 MG PO TABS
75.0000 mg | ORAL_TABLET | Freq: Every day | ORAL | Status: DC
Start: 2013-04-11 — End: 2013-04-21

## 2013-04-11 MED ORDER — SIMVASTATIN 10 MG PO TABS
10.0000 mg | ORAL_TABLET | Freq: Every day | ORAL | Status: DC
  Filled 2013-04-11: qty 1

## 2013-04-11 MED ORDER — SIMVASTATIN 10 MG PO TABS: 10.0000 mg | ORAL_TABLET | Freq: Every day | ORAL | Status: DC

## 2013-04-11 NOTE — Progress Notes (Signed)
Stroke Team Progress Note  HISTORY Lisa [name] is an 80 y.o. female history of hypertension and hyperlipidemia who developed acute weakness involving right arm and leg at 10 AM today 04/09/2013. Patient's voice was a low-volume in speech was slow but not particularly slurred. Facial drooping was noted, however. Patient has no history of stroke nor TIA. She has not been taking antiplatelet therapy on a daily basis. CT scan of her head showed no acute intracranial abnormality. Deficits have improved and patient has no complaints at this point. NIH stroke score was 0. Patient was not administerd TPA secondary to Beyond time window for TPA consideration arrived in the ER; rapidly resolving deficits. She was admitted for further evaluation and treatment.  SUBJECTIVE Patient is up in the chair at the bedside. "My daughter just went downstairs, and I need to call her. She will kill me if she doesn't get to talk to you."  OBJECTIVE Most recent Vital Signs: Filed Vitals:   04/10/13 2012 04/11/13 0000 04/11/13 0400 04/11/13 0729  BP: 152/69 144/58 161/70 152/62  Pulse: 64 60 55 59  Temp: 98 F (36.7 C) 97.7 F (36.5 C) 97.6 F (36.4 C) 98.6 F (37 C)  TempSrc: Oral Oral Oral Oral  Resp: 18 18 18 18   Height:      Weight:      SpO2: 97% 96% 98% 98%   CBG (last 3)  No results found for this basename: GLUCAP,  in the last 72 hours  IV Fluid Intake:     MEDICATIONS  . aspirin  300 mg Rectal Daily   Or  . aspirin  325 mg Oral Daily  . carvedilol  6.25 mg Oral BID WC  . heparin  5,000 Units Subcutaneous Q8H   PRN:  hydrALAZINE, senna-docusate, traMADol, zolpidem  Diet:  Cardiac thin liquids Activity:  Bedrest with Bathroom privileges DVT Prophylaxis:  Heparin 5000 units sq tid   CLINICALLY SIGNIFICANT STUDIES Basic Metabolic Panel:   Recent Labs Lab 04/09/13 1658  NA 141  K 4.8  CL 104  GLUCOSE 121*  BUN 36*  CREATININE 1.10   Liver Function Tests: No results found for this  basename: AST, ALT, ALKPHOS, BILITOT, PROT, ALBUMIN,  in the last 168 hours CBC:   Recent Labs Lab 04/09/13 1648 04/09/13 1658  WBC 6.3  --   NEUTROABS 4.0  --   HGB 14.0 14.6  HCT 41.6 43.0  MCV 88.3  --   PLT 184  --    Coagulation:   Recent Labs Lab 04/09/13 1648  LABPROT 13.8  INR 1.08   Cardiac Enzymes: No results found for this basename: CKTOTAL, CKMB, CKMBINDEX, TROPONINI,  in the last 168 hours Urinalysis: No results found for this basename: COLORURINE, APPERANCEUR, LABSPEC, PHURINE, GLUCOSEU, HGBUR, BILIRUBINUR, KETONESUR, PROTEINUR, UROBILINOGEN, NITRITE, LEUKOCYTESUR,  in the last 168 hours Lipid Panel    Component Value Date/Time   CHOL 195 04/10/2013 0700   TRIG 241* 04/10/2013 0700   HDL 25* 04/10/2013 0700   HDL 25* 09/10/2012 0823   CHOLHDL 7.8 04/10/2013 0700   CHOLHDL 7.5* 09/10/2012 0823   VLDL 48* 04/10/2013 0700   LDLCALC 122* 04/10/2013 0700   LDLCALC 114* 09/10/2012 0823   HgbA1C  Lab Results  Component Value Date   HGBA1C 6.4* 04/10/2013    Urine Drug Screen:   No results found for this basename: labopia,  cocainscrnur,  labbenz,  amphetmu,  thcu,  labbarb    Alcohol Level: No results found for this  basename: ETH,  in the last 168 hours   CT of the brain  04/09/2013   No acute abnormality.     MRI of the brain  04/10/2013    1. Small, acute infarct in the left corona radiata. 2. Mild chronic small vessel ischemic disease.   MRA of the brain  04/10/2013    No evidence of major intracranial arterial occlusion or flow-limiting stenosis. Mild anterior and posterior circulation branch vessel irregularity and moderate irregular narrowing of the left M1 segment, consistent with intracranial atherosclerosis.    2D Echocardiogram  EF 60-65% with no source of embolus.   Carotid Doppler  No evidence of hemodynamically significant internal carotid artery stenosis. Vertebral artery flow is antegrade.   CXR  04/09/2013   Cardiomegaly without decompensation.      EKG  normal sinus rhythm. For complete results please see formal report.   Therapy Recommendations HH PT  Physical Exam  General: The patient is alert and cooperative at the time of the examination.  Skin: No significant peripheral edema is noted.   Neurologic Exam  Mental status: The patient is oriented x 3.  Cranial nerves: Facial symmetry is present. Speech is normal, no aphasia or dysarthria is noted. Extraocular movements are full. Visual fields are full.  Motor: The patient has good strength in all 4 extremities. No drift is seen.  Sensory examination: Soft touch sensation is symmetric on the face, arms, and legs  Coordination: The patient has good finger-nose-finger and heel-to-shin bilaterally.  Gait and station: The patient is able to ambulate independently with a cane.  Reflexes: Deep tendon reflexes are symmetric.     ASSESSMENT [name] is a 80 y.o. female presenting with new onset right sided weakness. Imaging confirms a small left corona radiata infarct. Infarct etiology felt to be thrombotic due to small vessel disease.  On aspirin 81 mg orally every day prior to admission. Now on aspirin 325 mg orally every day for secondary stroke prevention. Patient with resultant right hemiparesis. Work up completed.  Hyperlipidemia, LDL 122 on no statin prior to admission, now on no statin, goal LDL < 100 (< 70 for diabetics) Diabetes, HgbA1c 6.4, goal < 7.0 Hypertension  Hospital day # 2  TREATMENT/PLAN  Change aspirin to clopidogrel 75 mg orally every day for secondary stroke prevention.  Patient thinking about statin. She is not agreeable to start it at this time.   HH PT  No further stroke workup indicated. Patient has a 10-15% risk of having another stroke over the next year, the highest risk is within 2 weeks of the most recent stroke/TIA (risk of having a stroke following a stroke or TIA is the same). Ongoing risk factor control by Primary  Care Physician Stroke Service will sign off. Please call should any needs arise. Follow up with Dr. Pearlean BrownieSethi, Stroke Clinic, in 2 months.   Dr. Anne HahnWillis iscussed diagnosis, prognosis,  treatment options and plan of care with daughter and patient.    Annie MainSHARON BIBY, MSN, RN, ANVP-BC, ANP-BC, Lawernce IonGNP-BC  St[facility] Pager: [phone] 04/11/2013 8:20 AM  I have personally obtained a history, examined the patient, evaluated imaging results, and formulated the assessment and plan of care. I agree with the above. Lesly DukesWILLIS,Cherylyn Sundby KEITH

## 2013-04-11 NOTE — Discharge Instructions (Addendum)
[facility] [phone] STROKE/TIA DISCHARGE INSTRUCTIONS SMOKING Cigarette smoking nearly doubles your risk of having a stroke & is the single most alterable risk factor  If you smoke or have smoked in the last 12 months, you are advised to quit smoking for your health.  Most of the excess cardiovascular risk related to smoking disappears within a year of stopping.  Ask you doctor about anti-smoking medications  [place] Quit Line: 1-800-QUIT NOW  Free Smoking Cessation Classes [phone]  CHOLESTEROL Know your levels; limit fat & cholesterol in your diet  Lipid Panel     Component Value Date/Time   CHOL 195 04/10/2013 0700   TRIG 241* 04/10/2013 0700   HDL 25* 04/10/2013 0700   HDL 25* 09/10/2012 0823   CHOLHDL 7.8 04/10/2013 0700   CHOLHDL 7.5* 09/10/2012 0823   VLDL 48* 04/10/2013 0700   LDLCALC 122* 04/10/2013 0700   LDLCALC 114* 09/10/2012 0823      Many patients benefit from treatment even if their cholesterol is at goal.  Goal: Total Cholesterol (CHOL) less than 160  Goal:  Triglycerides (TRIG) less than 150  Goal:  HDL greater than 40  Goal:  LDL (LDLCALC) less than 100   BLOOD PRESSURE American Stroke Association blood pressure target is less that 120/80 mm/Hg  Your discharge blood pressure is:  BP: 152/62 mmHg  Monitor your blood pressure  Limit your salt and alcohol intake  Many individuals will require more than one medication for high blood pressure  DIABETES (A1c is a blood sugar average for last 3 months) Goal HGBA1c is under 7% (HBGA1c is blood sugar average for last 3 months)  Diabetes: No known diagnosis of diabetes Pt has diabetes    Lab Results  Component Value Date   HGBA1C 6.4* 04/10/2013     Your HGBA1c can be lowered with medications, healthy diet, and exercise.  Check your blood sugar as directed by your physician  Call your physician if you experience unexplained or low blood sugars.  PHYSICAL  ACTIVITY/REHABILITATION Goal is 30 minutes at least 4 days per week  Activity: Increase activity slowly, Therapies: Physical Therapy: Outpatient Return to work:    Activity decreases your risk of heart attack and stroke and makes your heart stronger.  It helps control your weight and blood pressure; helps you relax and can improve your mood.  Participate in a regular exercise program.  Talk with your doctor about the best form of exercise for you (dancing, walking, swimming, cycling).  DIET/WEIGHT Goal is to maintain a healthy weight  Your discharge diet is: Cardiac thin  liquids Your height is:  Height: 5\' 7"  (170.2 cm) Your current weight is: Weight: 72.938 kg (160 lb 12.8 oz) Your Body Mass Index (BMI) is:  BMI (Calculated): 25.2  Following the type of diet specifically designed for you will help prevent another stroke.  Your goal weight range is:  122 - 153  Your goal Body Mass Index (BMI) is 19-24.  Healthy food habits can help reduce 3 risk factors for stroke:  High cholesterol, hypertension, and excess weight.  RESOURCES Stroke/Support Group:  Call [phone]   STROKE EDUCATION PROVIDED/REVIEWED AND GIVEN TO PATIENT Stroke warning signs and symptoms How to activate emergency medical system (call 911). Medications prescribed at discharge. Need for follow-up after discharge. Personal risk factors for stroke. Pneumonia vaccine given: No Flu vaccine given: No My questions have been answered, the writing is legible, and I understand these instructions.  I  will adhere to these goals & educational materials that have been provided to me after my discharge from the hospital.

## 2013-04-11 NOTE — Discharge Summary (Signed)
Physician Discharge Summary  Lisa SouKathleen N Macera ZOX:[id number] DOB: 11/07/1921 DOA: 04/09/2013  PCP: Bufford SpikesEED, TIFFANY, DO  Admit date: 04/09/2013 Discharge date: 04/11/2013  Time spent: 35 minutes  Recommendations for Outpatient Follow-up:  1. Need repeat Cholesterol level.   Discharge Diagnoses:    Acute Left infarct corona radiata    HYPERTENSION   Type II or unspecified type diabetes mellitus with renal manifestations, not stated as uncontrolled   Cervicalgia   Hyperlipidemia LDL goal < 100   Stroke   Discharge Condition: stable  Diet recommendation: Heart Healthy  Filed Weights   04/09/13 2200  Weight: 72.621 kg (160 lb 1.6 oz)    History of present illness:  Lisa [name] is a 80 y.o. female with Past medical history of hypertension, diabetes, osteoarthritis, dyslipidemia.  The patient is coming from home.  The patient was brought in as she was started to having weakness on his right side. This happened around 10:00 in the morning she mentions when she woke up there were no weakness and she was at her baseline. She started having difficulty controlling her right hand initially. Later on around often on she started having difficulty ambulating with the right leg and was dragging her right leg when she was walking and therefore her friend recommended her to go to the hospital. She denies any dizziness, trauma, fever, chills, cough, chest pain, palpitation, focal neurological deficit anywhere as, diarrhea, vomiting, constipation, burning urination, active bleeding, leg swelling orthopnea PND.  She mentions she is compliant with her medication and takes her blood pressure measurement on a regular basis and her blood pressure has been ranging good.  She mentions she's not taking aspirin on a regular basis and does not remember the last time she took are aspirin.   Hospital Course:  [facility]  Patient present with right side hemiparesis. Her symptoms  have almost resolved.  HB A1c 6.4. LDL at 122, triglycerides at 241. Agree to take simvastatin. ECHO no cardiac source of emboli. Doppler : 1-39% ICAstenosis  Home health PT. Per family patient at home at times has balance problem. PT to follow at home.  Will provide prescription for plavix for secondary stroke prevention, per neuro recommendation.   2-Accelerated hypertension; continue with coreg. Resume HCTZ.    Procedures: ECHO; Left ventricle: The cavity size was normal. Wall thickness was increased in a pattern of mild LVH. Systolic function was normal. The estimated ejection fraction was in the range of 60% to 65%. Wall motion was normal; there were no regional wall motion abnormalities. Doppler parameters are consistent with abnormal left ventricular relaxation (grade 1 diastolic dysfunction).  Carotid doppler; Right: intimal wall thickening CCA. moderate soft plaque origin ECA. Moderate soft plaque proximal ICA. 1-39% ICA stenosis. Vertebral artery flow is atypical. Left: intimal wall thickening CCA. Mild to moderate calcific plaque origin ICA and ECA with acoustic shadowing. 1-39% ICA stenosis. Vertebral artery flow is antegrade.   Consultations:  Neuro  Discharge Exam: Filed Vitals:   04/11/13 0729  BP: 152/62  Pulse: 59  Temp: 98.6 F (37 C)  Resp: 18    General: No distress.  Cardiovascular: S 1, S 2 RRR Respiratory: CTA Neuro exam; non focal.   Discharge Instructions  Discharge Orders   Future Appointments Provider Department Dept Phone   04/21/2013 11:00 AM Kermit Baloiffany L Reed[facility] [phone]   Future Orders Complete By Expires   Diet - low sodium heart healthy  As directed  Increase activity slowly  As directed        Medication List    STOP taking these medications       aspirin 81 MG tablet      TAKE these medications       calcium-vitamin D 500-200 MG-UNIT per tablet  Commonly known as:  OSCAL WITH D  Take 1 tablet by  mouth.     carvedilol 6.25 MG tablet  Commonly known as:  COREG  Take 6.25 mg by mouth 2 (two) times daily with a meal.     cholecalciferol 1000 UNITS tablet  Commonly known as:  VITAMIN D  Take 1,000 Units by mouth daily.     clopidogrel 75 MG tablet  Commonly known as:  PLAVIX  Take 1 tablet (75 mg total) by mouth daily with breakfast.     cyanocobalamin 1000 MCG tablet  Take 100 mcg by mouth daily.     hydrochlorothiazide 25 MG tablet  Commonly known as:  HYDRODIURIL  Take 25 mg by mouth daily as needed. Swelling     simvastatin 10 MG tablet  Commonly known as:  ZOCOR  Take 1 tablet (10 mg total) by mouth daily at 6 PM.     traMADol 50 MG tablet  Commonly known as:  ULTRAM  Take 1 tablet (50 mg total) by mouth daily as needed for pain. Take one tablet twice daily as needed for neck pain     zolpidem 5 MG tablet  Commonly known as:  AMBIEN  Take 1 tablet (5 mg total) by mouth at bedtime as needed for sleep.       No Known Allergies     Follow-up Information   Follow up with Gates Rigg, MD. Schedule an appointment as soon as possible for a visit in 2 months. (stroke clinic)    Specialties:  Neurology, Radiology   Contact information:   [address] [place] [address] [phone]        The results of significant diagnostics from this hospitalization (including imaging, microbiology, ancillary and laboratory) are listed below for reference.    Significant Diagnostic Studies: Dg Chest 2 View  04/09/2013   CLINICAL DATA:  Stroke.  Chronic short of breath.  EXAM: CHEST  2 VIEW  COMPARISON:  11/10/2008  FINDINGS: Mild cardiomegaly. Lungs are under aerated. Normal vascularity. No pneumothorax. No pleural effusion. No pneumothorax.  IMPRESSION: Cardiomegaly without decompensation.   Electronically Signed   By: Maryclare Bean M.D.   On: 04/09/2013 17:28   Ct Head Wo Contrast  04/09/2013   CLINICAL DATA:  Right arm and hand weakness.  EXAM: CT  HEAD WITHOUT CONTRAST  TECHNIQUE: Contiguous axial images were obtained from the base of the skull through the vertex without intravenous contrast.  COMPARISON:  CT 02/13/2006  FINDINGS: Mild atrophy. Mild chronic microvascular ischemic change in the white matter. No acute infarct. Negative for hemorrhage or mass. No acute skull abnormality.  IMPRESSION: No acute abnormality.   Electronically Signed   By: Marlan Palau M.D.   On: 04/09/2013 17:16   Mr Brain Wo Contrast  04/10/2013   CLINICAL DATA:  Right-sided weakness.  Stroke.  EXAM: MRI HEAD WITHOUT CONTRAST  MRA HEAD WITHOUT CONTRAST  TECHNIQUE: Multiplanar, multiecho pulse sequences of the brain and surrounding structures were obtained without intravenous contrast. Angiographic images of the head were obtained using MRA technique without contrast.  COMPARISON:  Head CT 04/09/2013  FINDINGS: MRI HEAD FINDINGS  There is a small  acute infarct in the left corona radiata which extends inferiorly towards the posterior aspect of the left external capsule. There is no intracranial hemorrhage. Scattered foci of T2 hyperintensity within the subcortical and deep cerebral white matter and brainstem are compatible with mild chronic small vessel ischemic disease. Age-related cerebral atrophy is present. There is no mass, midline shift, or extra-axial fluid collection. Major intracranial vascular flow voids are unremarkable. Prior bilateral cataract surgery is noted. Visualized paranasal sinuses and mastoid air cells are clear.  MRA HEAD FINDINGS  The visualized distal vertebral arteries are patent. The right vertebral artery is hypoplastic. PICA origins are patent bilaterally. Basilar artery is small in caliber but patent and without focal stenosis. 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 SCA origins are  patent bilaterally. The basilar artery is markedly hypoplastic distal to the right SCA origin. There are hypoplastic bilateral P1 segments with prominent bilateral posterior communicating arteries present. There is mild irregularity of distal PCA branches bilaterally.  Internal carotid arteries are patent from skullbase to carotid termini. ACA and MCA origins and visualized branches are patent. There is mild right MCA and moderate left MCA irregularity including most prominent involvement of the left M1 segment. There is a small, patent anterior communicating artery. No intracranial aneurysm is identified.  IMPRESSION: 1. Small, acute infarct in the left corona radiata. 2. Mild chronic small vessel ischemic disease. 3. No evidence of major intracranial arterial occlusion or flow-limiting stenosis. Mild anterior and posterior circulation branch vessel irregularity and moderate irregular narrowing of the left M1 segment, consistent with intracranial atherosclerosis.   Electronically Signed   By: Sebastian Ache   On: 04/10/2013 13:30   Mr Maxine Glenn Head/brain Wo Cm  04/10/2013   CLINICAL DATA:  Right-sided weakness.  Stroke.  EXAM: MRI HEAD WITHOUT CONTRAST  MRA HEAD WITHOUT CONTRAST  TECHNIQUE: Multiplanar, multiecho pulse sequences of the brain and surrounding structures were obtained without intravenous contrast. Angiographic images of the head were obtained using MRA technique without contrast.  COMPARISON:  Head CT 04/09/2013  FINDINGS: MRI HEAD FINDINGS  There is a small acute infarct in the left corona radiata which extends inferiorly towards the posterior aspect of the left external capsule. There is no intracranial hemorrhage. Scattered foci of T2 hyperintensity within the subcortical and deep cerebral white matter and brainstem are compatible with mild chronic small vessel ischemic disease. Age-related cerebral atrophy is present. There is no mass, midline shift, or extra-axial fluid collection. Major intracranial  vascular flow voids are unremarkable. Prior bilateral cataract surgery is noted. Visualized paranasal sinuses and mastoid air cells are clear.  MRA HEAD FINDINGS  The visualized distal vertebral arteries are patent. The right vertebral artery is hypoplastic. PICA origins are patent bilaterally. Basilar artery is small in caliber but patent and without focal stenosis. 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 SCA origins are patent bilaterally. The basilar artery is markedly hypoplastic distal to the right SCA origin. There are hypoplastic bilateral P1 segments with prominent bilateral posterior communicating arteries present. There is mild irregularity of distal PCA branches bilaterally.  Internal carotid arteries are patent from skullbase to carotid termini. ACA and MCA origins and visualized branches are patent. There is mild right MCA and moderate  left MCA irregularity including most prominent involvement of the left M1 segment. There is a small, patent anterior communicating artery. No intracranial aneurysm is identified.  IMPRESSION: 1. Small, acute infarct in the left corona radiata. 2. Mild chronic small vessel ischemic disease. 3. No evidence of major intracranial arterial occlusion or flow-limiting stenosis. Mild anterior and posterior circulation branch vessel irregularity and moderate irregular narrowing of the left M1 segment, consistent with intracranial atherosclerosis.   Electronically Signed   By: Sebastian Ache   On: 04/10/2013 13:30    Microbiology: No results found for this or any previous visit (from the past 240 hour(s)).   Labs: Basic Metabolic Panel:  Recent Labs Lab 04/09/13 1658  NA 141  K 4.8  CL 104  GLUCOSE 121*  BUN 36*  CREATININE 1.10   Liver Function Tests: No results found for this basename: AST, ALT,  ALKPHOS, BILITOT, PROT, ALBUMIN,  in the last 168 hours No results found for this basename: LIPASE, AMYLASE,  in the last 168 hours No results found for this basename: AMMONIA,  in the last 168 hours CBC:  Recent Labs Lab 04/09/13 1648 04/09/13 1658  WBC 6.3  --   NEUTROABS 4.0  --   HGB 14.0 14.6  HCT 41.6 43.0  MCV 88.3  --   PLT 184  --    Cardiac Enzymes: No results found for this basename: CKTOTAL, CKMB, CKMBINDEX, TROPONINI,  in the last 168 hours BNP: BNP (last 3 results) No results found for this basename: PROBNP,  in the last 8760 hours CBG: No results found for this basename: GLUCAP,  in the last 168 hours     Signed:  Darick Fetters  [facility] 04/11/2013, 9:49 AM

## 2013-04-11 NOTE — Evaluation (Signed)
Speech Language Pathology Evaluation Patient Details Name: Lisa [name] MRN: [id number] DOB: 07/31/1921 Today's Date: 04/11/2013 Time: 4098-11911020-1045    Problem List:  Patient Active Problem List   Diagnosis Date Noted  . CVA (cerebral vascular accident) 04/09/2013  . Stroke 04/09/2013  . Insomnia 09/14/2012  . Hyperlipidemia LDL goal < 100   . Osteoarthrosis, unspecified whether generalized or localized, lower leg   . Undiagnosed cardiac murmurs   . Shortness of breath   . Type II or unspecified type diabetes mellitus with renal manifestations, not stated as uncontrolled   . Chronic kidney disease, stage III (moderate)   . Cervicalgia   . HYPERTENSION 11/30/2008  . DYSPNEA 11/30/2008  . TACHYCARDIA, HX OF 11/30/2008   Past Medical History:  Past Medical History  Diagnosis Date  . Unspecified vitamin D deficiency   . Regional enteritis of unspecified site   . Hyperlipidemia LDL goal < 100   . Osteoarthrosis, unspecified whether generalized or localized, lower leg   . Dermatophytosis of groin and perianal area   . Cervicalgia   . Insomnia, unspecified   . Chronic kidney disease, stage III (moderate)   . Type II or unspecified type diabetes mellitus with renal manifestations, not stated as uncontrolled   . Other abnormal glucose   . Other B-complex deficiencies   . Other malaise and fatigue   . Senile cataract, unspecified   . Unspecified essential hypertension   . Muscle weakness (generalized)   . Tachycardia, unspecified   . Palpitations   . Undiagnosed cardiac murmurs   . Shortness of breath    Past Surgical History:  Past Surgical History  Procedure Laterality Date  . Tonsillectomy  1944  . Tubal ligation  1951  . Toe amputation  2002  . Eye surgery     HPI:  Lisa [name] is a 80 y.o. female with Past medical history of hypertension, diabetes, osteoarthritis, dyslipidemia.   Assessment / Plan / Recommendation Clinical Impression  Patient appears to  be WFL-WNL for cognitive-linguistic and speech function. No deficits in memory, reasoning, problem solving, awareness, communication of wants/needs/thoughts noted as per this evaluation. Patient able to recall and describe medical interventions and is understanding/comprehends reason for hospitalization. Patient appears cognitively intact but will benefit from brief period of assistance and supervision at home to ensure that she is compliant with safety precautions as per medical and therapist recommendations(PT recommends use of cane at home). Patient and daughter both state that patient has a history of intermittent but infrequent difficulty with swallowing "certain things...like pepper" and patient described feeling that throat becomes tight during these dysphagia incidents. SLP recommended that patient consult with MD if any increase in frequency of dysphagia events and to avoid food textures that may be problematic, such as tough, hard textures.    SLP Assessment  Patient does not need any further Speech Lanaguage Pathology Services    Follow Up Recommendations  None    Frequency and Duration        Pertinent Vitals/Pain    SLP Goals     SLP Evaluation Prior Functioning  Cognitive/Linguistic Baseline: Within functional limits Type of Home: House  Lives With: Alone Available Help at Discharge: Family;Available 24 hours/day   Cognition  Overall Cognitive Status: Within Functional Limits for tasks assessed Arousal/Alertness: Awake/alert Orientation Level: Oriented X4 Attention: Selective Selective Attention: Appears intact Memory: Appears intact (patient stated that her memory is "no worse than it was!") Awareness: Appears intact Problem Solving: Appears intact Executive Function: Reasoning;Decision  Making;Self Monitoring Reasoning: Appears intact Self Monitoring: Appears intact Safety/Judgment: Appears intact    Comprehension  Auditory Comprehension Overall Auditory  Comprehension: Appears within functional limits for tasks assessed    Expression Expression Primary Mode of Expression: Verbal Verbal Expression Overall Verbal Expression: Appears within functional limits for tasks assessed Initiation: No impairment Repetition: No impairment Naming: No impairment Pragmatics: No impairment Written Expression Written Expression: Not tested   Oral / Motor Oral Motor/Sensory Function Overall Oral Motor/Sensory Function: Appears within functional limits for tasks assessed   GO     Pablo Lawrence 04/11/2013, 5:57 PM Angela Nevin, MA, CCC-SLP [facility] Speech-Language Pathologist

## 2013-04-11 NOTE — Care Management Note (Signed)
    Page 1 of 2   04/11/2013     10:42:07 AM   CARE MANAGEMENT NOTE 04/11/2013  Patient:  Lisa [name],Lisa [name]   Account Number:  [id number]  Date Initiated:  04/11/2013  Documentation initiated by:  Donn PieriniWEBSTER,Naliya Gish  Subjective/Objective Assessment:   Pt admitted with stroke     Action/Plan:   PTA pt lived at home alone- PT/OT evals   Anticipated DC Date:  04/11/2013   Anticipated DC Plan:  HOME W HOME HEALTH SERVICES      DC Planning Services  CM consult      [facility]   Choice offered to / List presented to:  C-1 Patient        HH arranged  HH-2 PT  HH-3 OT      [facility]   Status of service:  Completed, signed off Medicare Important Message given?   (If response is "NO", the following Medicare IM given date fields will be blank) Date Medicare IM given:   Date Additional Medicare IM given:    Discharge Disposition:  HOME W HOME HEALTH SERVICES  Per UR Regulation:  Reviewed for med. necessity/level of care/duration of stay  If discussed at Long Length of Stay Meetings, dates discussed:    Comments:  04/12/23- 1015- Donn PieriniKristi Vernette Moise RN, BSN [phone] Pt [facility]- in to speak with pt and daughter- [facility] [place] given to pt for review- [facility]- [facility] [place] [facility] for PT/OT- services to begin within 24-48 hr post discharge- pt states that she has a cane at home and will borrow a RW to use short term. No further needs.

## 2013-04-12 DIAGNOSIS — I69998 Other sequelae following unspecified cerebrovascular disease: Secondary | ICD-10-CM

## 2013-04-12 DIAGNOSIS — M6281 Muscle weakness (generalized): Secondary | ICD-10-CM

## 2013-04-12 DIAGNOSIS — Z5189 Encounter for other specified aftercare: Secondary | ICD-10-CM

## 2013-04-12 DIAGNOSIS — R269 Unspecified abnormalities of gait and mobility: Secondary | ICD-10-CM

## 2013-04-15 ENCOUNTER — Encounter: Payer: Self-pay | Admitting: Neurology

## 2013-04-21 ENCOUNTER — Ambulatory Visit (INDEPENDENT_AMBULATORY_CARE_PROVIDER_SITE_OTHER): Payer: Medicare Other | Admitting: Internal Medicine

## 2013-04-21 ENCOUNTER — Encounter: Payer: Self-pay | Admitting: Internal Medicine

## 2013-04-21 VITALS — BP 158/80 | HR 62 | Temp 97.9°F | Wt 162.4 lb

## 2013-04-21 DIAGNOSIS — N183 Chronic kidney disease, stage 3 unspecified: Secondary | ICD-10-CM

## 2013-04-21 DIAGNOSIS — I6350 Cerebral infarction due to unspecified occlusion or stenosis of unspecified cerebral artery: Secondary | ICD-10-CM

## 2013-04-21 DIAGNOSIS — E785 Hyperlipidemia, unspecified: Secondary | ICD-10-CM

## 2013-04-21 DIAGNOSIS — IMO0002 Reserved for concepts with insufficient information to code with codable children: Secondary | ICD-10-CM

## 2013-04-21 DIAGNOSIS — E1129 Type 2 diabetes mellitus with other diabetic kidney complication: Secondary | ICD-10-CM

## 2013-04-21 DIAGNOSIS — I639 Cerebral infarction, unspecified: Secondary | ICD-10-CM

## 2013-04-21 DIAGNOSIS — M542 Cervicalgia: Secondary | ICD-10-CM

## 2013-04-21 DIAGNOSIS — M1710 Unilateral primary osteoarthritis, unspecified knee: Secondary | ICD-10-CM

## 2013-04-21 MED ORDER — HYDROCHLOROTHIAZIDE 25 MG PO TABS: 25.0000 mg | ORAL_TABLET | Freq: Every day | ORAL | Status: DC | PRN

## 2013-04-21 MED ORDER — CLOPIDOGREL BISULFATE 75 MG PO TABS: 75.0000 mg | ORAL_TABLET | Freq: Every day | ORAL | Status: DC

## 2013-04-21 MED ORDER — TRAMADOL HCL 50 MG PO TABS: 50.0000 mg | ORAL_TABLET | Freq: Every day | ORAL | Status: DC | PRN

## 2013-04-21 MED ORDER — CARVEDILOL 6.25 MG PO TABS: 6.2500 mg | ORAL_TABLET | Freq: Two times a day (BID) | ORAL | Status: DC

## 2013-04-21 MED ORDER — SIMVASTATIN 10 MG PO TABS: 10.0000 mg | ORAL_TABLET | Freq: Every day | ORAL | Status: DC

## 2013-04-21 NOTE — Progress Notes (Signed)
Patient ID: Lisa [name], female   DOB: 03/12/1922, 80 y.oDoretha Sou.   MRN: [id number]   Location:  [facility]  No Known Allergies  Chief Complaint  Patient presents with  . Medical Managment of Chronic Issues    6 month f/u & meds, no recent labs.    HPI: Patient is a 80 y.o. white female seen in the office today for hospital f/u from acute stroke with right sided weakness, facial droop and dysarthria. Hospital said she could go home with intermittent care, but family decided she will need full care. Is doing her exercises and behaving.   Right hand is not as coordinated, right foot wants to drag if she doesn't tell it to lift.  Had facial droop, slurred speech, lid not opening as far, but those parts went away.  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   Ambien no longer covered on insurance.  Wants alternative.  Will call back with the letter from insurance company.   BP 133/60 at home with home health.  Checks off and on herself and 130-140s/70s.   Review of Systems:  Review of Systems  Constitutional: Positive for malaise/fatigue.  HENT: Negative for congestion.   Eyes: Negative for blurred vision.  Respiratory: Negative for cough and shortness of breath.   Cardiovascular: Negative for chest pain.  Gastrointestinal: Negative for abdominal pain, constipation, blood in stool and melena.  Genitourinary: Negative for dysuria, urgency and frequency.  Musculoskeletal: Negative for falls.  Skin: Negative for rash.  Neurological: Positive for focal weakness. Negative for dizziness and loss of consciousness.  Psychiatric/Behavioral: Negative for depression and memory loss.     Past Medical History  Diagnosis Date  . Unspecified vitamin D deficiency   . Regional enteritis of unspecified site   . Hyperlipidemia LDL goal < 100   . Osteoarthrosis, unspecified whether generalized or localized, lower leg   .  Dermatophytosis of groin and perianal area   . Cervicalgia   . Insomnia, unspecified   . Chronic kidney disease, stage III (moderate)   . Type II or unspecified type diabetes mellitus with renal manifestations, not stated as uncontrolled   . Other abnormal glucose   . Other B-complex deficiencies   . Other malaise and fatigue   . Senile cataract, unspecified   . Unspecified essential hypertension   . Muscle weakness (generalized)   . Tachycardia, unspecified   . Palpitations   . Undiagnosed cardiac murmurs   . Shortness of breath     Past Surgical History  Procedure Laterality Date  . Tonsillectomy  1944  . Tubal ligation  1951  . Toe amputation  2002  . Eye surgery      Social History:   reports that she has never smoked. She does not have any smokeless tobacco history on file. She reports that she does not drink alcohol or use illicit drugs.  No family history on file.  Medications: Patient's Medications  New Prescriptions   No medications on file  Previous Medications   CALCIUM-VITAMIN D (OSCAL WITH D) 500-200 MG-UNIT PER TABLET    Take 1 tablet by mouth.   CARVEDILOL (COREG) 6.25 MG TABLET    Take 6.25 mg by mouth 2 (two) times daily with a meal.    CHOLECALCIFEROL (VITAMIN D) 1000 UNITS TABLET    Take 1,000 Units by mouth daily.   CLOPIDOGREL (PLAVIX) 75 MG TABLET    Take 1 tablet (75  mg total) by mouth daily with breakfast.   CYANOCOBALAMIN 1000 MCG TABLET    Take 100 mcg by mouth daily.   HYDROCHLOROTHIAZIDE (HYDRODIURIL) 25 MG TABLET    Take 25 mg by mouth daily as needed. Swelling   SIMVASTATIN (ZOCOR) 10 MG TABLET    Take 1 tablet (10 mg total) by mouth daily at 6 PM.   TRAMADOL (ULTRAM) 50 MG TABLET    Take 1 tablet (50 mg total) by mouth daily as needed for pain. Take one tablet twice daily as needed for neck pain   ZOLPIDEM (AMBIEN) 5 MG TABLET    Take 1 tablet (5 mg total) by mouth at bedtime as needed for sleep.  Modified Medications   No medications on  file  Discontinued Medications   No medications on file     Physical Exam: Filed Vitals:   04/21/13 1111  BP: 158/80  Pulse: 62  Temp: 97.9 F (36.6 C)  TempSrc: Oral  Weight: 162 lb 6.4 oz (73.664 kg)  SpO2: 98%  Physical Exam  Constitutional: She is oriented to person, place, and time. She appears well-developed and well-nourished.  Cardiovascular: Normal rate, regular rhythm, normal heart sounds and intact distal pulses.   Pulmonary/Chest: Effort normal and breath sounds normal. No respiratory distress.  Abdominal: Soft. Bowel sounds are normal. She exhibits no distension and no mass. There is no tenderness.  Musculoskeletal:  4/5 strength of RLE, gait unsteady now vs. baseline  Neurological: She is alert and oriented to person, place, and time. No cranial nerve deficit.  Skin: Skin is warm and dry.  Psychiatric: She has a normal mood and affect.     Labs reviewed: Basic Metabolic Panel:  Recent Labs  16/10/96 0823 04/09/13 1658  NA 141 141  K 4.5 4.8  CL 103 104  CO2 23  --   GLUCOSE 115* 121*  BUN 26 36*  CREATININE 1.22* 1.10  CALCIUM 9.1  --   CBC:  Recent Labs  09/10/12 0823 04/09/13 1648 04/09/13 1658  WBC 6.5 6.3  --   NEUTROABS 4.0 4.0  --   HGB 13.9 14.0 14.6  HCT 41.0 41.6 43.0  MCV 88 88.3  --   PLT  --  184  --    Lipid Panel:  Recent Labs  09/10/12 0823 04/10/13 0700  CHOL  --  195  HDL 25* 25*  LDLCALC 114* 045*  TRIG 246* 241*  CHOLHDL 7.5* 7.8   Lab Results  Component Value Date   HGBA1C 6.4* 04/10/2013   Assessment/Plan 1. Stroke With mild residual right sided weakness Has been doing therapy Cont secondary prevention with bp and lipid control  2. Osteoarthrosis, unspecified whether generalized or localized, lower leg - helped with tramadol and tylenol - traMADol (ULTRAM) 50 MG tablet; Take 1 tablet (50 mg total) by mouth daily as needed. Take one tablet twice daily as needed for neck pain  Dispense: 30 tablet;  Refill: 0  3. Cervicalgia -neck pain improved with tramadol - traMADol (ULTRAM) 50 MG tablet; Take 1 tablet (50 mg total) by mouth daily as needed. Take one tablet twice daily as needed for neck pain  Dispense: 30 tablet; Refill: 0  4. Type II or unspecified type diabetes mellitus with renal manifestations, not stated as uncontrolled -cont dietary changes and encouraged her to walk more -cont plavix, statin, is not on ace/arb due to CKDIII  5. Chronic kidney disease, stage III (moderate) -cont bp control and avoid nephrotoxic meds like nsaids  6. Hyperlipidemia LDL goal < 100 -cont zocor  Labs/tests ordered:   Orders Placed This Encounter  Procedures  . Lipid panel    Standing Status: Future     Number of Occurrences: 1     Standing Expiration Date: 10/19/2013    Order Specific Question:  Has the patient fasted?    Answer:  Yes  . CBC with Differential    Standing Status: Future     Number of Occurrences: 1     Standing Expiration Date: 10/19/2013  . Basic metabolic panel    Standing Status: Future     Number of Occurrences: 1     Standing Expiration Date: 10/19/2013    Order Specific Question:  Has the patient fasted?    Answer:  Yes    Next appt:  10 wks

## 2013-04-22 ENCOUNTER — Telehealth: Payer: Self-pay | Admitting: *Deleted

## 2013-04-22 NOTE — Telephone Encounter (Signed)
Patient called and stated that you wanted her to call. Insurance company has denied for paying for Ambien, patient is previously taking Zolpidem. The alternate drug the insurance will pay for is Trazodone HCL. The Rx is out and needs something today. Pharmacy [facility] on [address]

## 2013-04-22 NOTE — Telephone Encounter (Signed)
Trazodone 50mg  po qhs prn insomnia is ok.  Advise her that this could increase her risk of falling and she should only use it on an as needed basis.

## 2013-04-23 ENCOUNTER — Other Ambulatory Visit: Payer: Self-pay | Admitting: *Deleted

## 2013-04-23 MED ORDER — TRAZODONE HCL 50 MG PO TABS: ORAL_TABLET | ORAL | Status: DC

## 2013-04-23 NOTE — Telephone Encounter (Signed)
Patient Notified and called in Rx into [facility]

## 2013-04-29 NOTE — Telephone Encounter (Signed)
Patient cannot take the Trazodone it is causing nausea and cannot sleep. Please Advise

## 2013-04-29 NOTE — Telephone Encounter (Signed)
I recommend she stop the trazodone and try melatonin 1mg  by mouth one to two hours before bedtime.  This is an over the counter medicine and much safer than the prescription sleeping pills.

## 2013-04-30 ENCOUNTER — Other Ambulatory Visit: Payer: Self-pay | Admitting: *Deleted

## 2013-04-30 MED ORDER — MELATONIN 1 MG PO TABS: ORAL_TABLET | ORAL | Status: DC

## 2013-04-30 NOTE — Telephone Encounter (Signed)
Patient Notified and understood. 

## 2013-05-07 ENCOUNTER — Emergency Department (HOSPITAL_COMMUNITY)
Admission: EM | Admit: 2013-05-07 | Discharge: 2013-05-07 | Disposition: A | Discharge status: Home / Self Care | Payer: Medicare Other | Attending: Emergency Medicine | Admitting: Emergency Medicine

## 2013-05-07 ENCOUNTER — Emergency Department (HOSPITAL_COMMUNITY): Payer: Medicare Other

## 2013-05-07 ENCOUNTER — Encounter (HOSPITAL_COMMUNITY): Payer: Self-pay | Admitting: Emergency Medicine

## 2013-05-07 DIAGNOSIS — I129 Hypertensive chronic kidney disease with stage 1 through stage 4 chronic kidney disease, or unspecified chronic kidney disease: Secondary | ICD-10-CM | POA: Insufficient documentation

## 2013-05-07 DIAGNOSIS — N183 Chronic kidney disease, stage 3 unspecified: Secondary | ICD-10-CM | POA: Insufficient documentation

## 2013-05-07 DIAGNOSIS — H538 Other visual disturbances: Secondary | ICD-10-CM | POA: Insufficient documentation

## 2013-05-07 DIAGNOSIS — E538 Deficiency of other specified B group vitamins: Secondary | ICD-10-CM | POA: Insufficient documentation

## 2013-05-07 DIAGNOSIS — M1710 Unilateral primary osteoarthritis, unspecified knee: Secondary | ICD-10-CM | POA: Insufficient documentation

## 2013-05-07 DIAGNOSIS — E785 Hyperlipidemia, unspecified: Secondary | ICD-10-CM | POA: Insufficient documentation

## 2013-05-07 DIAGNOSIS — Z7902 Long term (current) use of antithrombotics/antiplatelets: Secondary | ICD-10-CM | POA: Insufficient documentation

## 2013-05-07 DIAGNOSIS — Z8619 Personal history of other infectious and parasitic diseases: Secondary | ICD-10-CM | POA: Insufficient documentation

## 2013-05-07 DIAGNOSIS — R011 Cardiac murmur, unspecified: Secondary | ICD-10-CM | POA: Insufficient documentation

## 2013-05-07 DIAGNOSIS — E1129 Type 2 diabetes mellitus with other diabetic kidney complication: Secondary | ICD-10-CM | POA: Insufficient documentation

## 2013-05-07 DIAGNOSIS — R42 Dizziness and giddiness: Secondary | ICD-10-CM | POA: Insufficient documentation

## 2013-05-07 DIAGNOSIS — Z8719 Personal history of other diseases of the digestive system: Secondary | ICD-10-CM | POA: Insufficient documentation

## 2013-05-07 DIAGNOSIS — I10 Essential (primary) hypertension: Secondary | ICD-10-CM

## 2013-05-07 DIAGNOSIS — IMO0002 Reserved for concepts with insufficient information to code with codable children: Secondary | ICD-10-CM | POA: Insufficient documentation

## 2013-05-07 DIAGNOSIS — Z79899 Other long term (current) drug therapy: Secondary | ICD-10-CM | POA: Insufficient documentation

## 2013-05-07 DIAGNOSIS — Z8673 Personal history of transient ischemic attack (TIA), and cerebral infarction without residual deficits: Secondary | ICD-10-CM | POA: Insufficient documentation

## 2013-05-07 LAB — BASIC METABOLIC PANEL
BUN: 25 mg/dL — ABNORMAL HIGH (ref 6–23)
CALCIUM: 9.2 mg/dL (ref 8.4–10.5)
CO2: 26 mEq/L (ref 19–32)
CREATININE: 0.99 mg/dL (ref 0.50–1.10)
Chloride: 105 mEq/L (ref 96–112)
GFR calc Af Amer: 57 mL/min — ABNORMAL LOW (ref >90)
GFR calc non Af Amer: 49 mL/min — ABNORMAL LOW (ref >90)
Glucose, Bld: 123 mg/dL — ABNORMAL HIGH (ref 70–99)
Potassium: 4.2 mEq/L (ref 3.7–5.3)
Sodium: 144 mEq/L (ref 137–147)

## 2013-05-07 LAB — CBC WITH DIFFERENTIAL/PLATELET
Basophils Absolute: 0 10*3/uL (ref 0.0–0.1)
Basophils Relative: 0 % (ref 0–1)
EOS PCT: 9 % — AB (ref 0–5)
Eosinophils Absolute: 0.6 10*3/uL (ref 0.0–0.7)
HEMATOCRIT: 38.1 % (ref 36.0–46.0)
Hemoglobin: 13.1 g/dL (ref 12.0–15.0)
LYMPHS PCT: 26 % (ref 12–46)
Lymphs Abs: 1.8 10*3/uL (ref 0.7–4.0)
MCH: 30 pg (ref 26.0–34.0)
MCHC: 34.4 g/dL (ref 30.0–36.0)
MCV: 87.2 fL (ref 78.0–100.0)
MONO ABS: 0.4 10*3/uL (ref 0.1–1.0)
Monocytes Relative: 5 % (ref 3–12)
Neutro Abs: 3.9 10*3/uL (ref 1.7–7.7)
Neutrophils Relative %: 59 % (ref 43–77)
Platelets: 176 10*3/uL (ref 150–400)
RBC: 4.37 MIL/uL (ref 3.87–5.11)
RDW: 13.6 % (ref 11.5–15.5)
WBC: 6.7 10*3/uL (ref 4.0–10.5)

## 2013-05-07 LAB — PRO B NATRIURETIC PEPTIDE: PRO B NATRI PEPTIDE: 1296 pg/mL — AB (ref 0–450)

## 2013-05-07 NOTE — ED Provider Notes (Addendum)
CSN: [id number]     Arrival date & time 05/07/13  1830 History   First MD Initiated Contact with Patient 05/07/13 1839     Chief Complaint  Patient presents with  . Hypertension     (Consider location/radiation/quality/duration/timing/severity/associated sxs/prior Treatment) Patient is a 80 y.o. female presenting with hypertension. The history is provided by the patient.  Hypertension Pertinent negatives include no chest pain, no abdominal pain, no headaches and no shortness of breath.   patient status post CVA January 21. Patient at that time had some right hand and right leg weakness and slurred speech and a little bit of facial droop. Patient's symptoms all have resolved completely. Today patient had the onset of some fuzziness in her vision and a brief period of feeling dizzy and lightheadedness no vertigo. This lasted about 10 minutes and then resolved. No history of headache chest pain or shortness of breath or any persistent focal deficits. Patient feels fine now. However she did check her blood pressure at time it was significantly high it was 230/90. And normally her blood pressure is 140/70. She does have a history of hypertension and takes hydrochlorothiazide. History of hypertension for many years.  Past Medical History  Diagnosis Date  . Unspecified vitamin D deficiency   . Regional enteritis of unspecified site   . Hyperlipidemia LDL goal < 100   . Osteoarthrosis, unspecified whether generalized or localized, lower leg   . Dermatophytosis of groin and perianal area   . Cervicalgia   . Insomnia, unspecified   . Chronic kidney disease, stage III (moderate)   . Type II or unspecified type diabetes mellitus with renal manifestations, not stated as uncontrolled   . Other abnormal glucose   . Other B-complex deficiencies   . Other malaise and fatigue   . Senile cataract, unspecified   . Unspecified essential hypertension   . Muscle weakness (generalized)   . Tachycardia,  unspecified   . Palpitations   . Undiagnosed cardiac murmurs   . Shortness of breath    Past Surgical History  Procedure Laterality Date  . Tonsillectomy  1944  . Tubal ligation  1951  . Toe amputation  2002  . Eye surgery     No family history on file. History  Substance Use Topics  . Smoking status: Never Smoker   . Smokeless tobacco: Not on file  . Alcohol Use: No   OB History   Grav Para Term Preterm Abortions TAB SAB Ect Mult Living                 Review of Systems  Constitutional: Negative for fever and fatigue.  HENT: Negative for congestion.   Eyes: Positive for visual disturbance. Negative for pain and redness.  Respiratory: Negative for shortness of breath.   Cardiovascular: Negative for chest pain.  Gastrointestinal: Negative for nausea, vomiting and abdominal pain.  Genitourinary: Negative for dysuria.  Musculoskeletal: Negative for back pain and neck pain.  Neurological: Positive for light-headedness. Negative for facial asymmetry, speech difficulty, weakness, numbness and headaches.  Hematological: Does not bruise/bleed easily.  Psychiatric/Behavioral: Negative for confusion.      Allergies  Review of patient's allergies indicates no known allergies.  Home Medications   Current Outpatient Rx  Name  Route  Sig  Dispense  Refill  . calcium-vitamin D (OSCAL WITH D) 500-200 MG-UNIT per tablet   Oral   Take 1 tablet by mouth.         . carvedilol (COREG) 6.25 MG tablet  Oral   Take 1 tablet (6.25 mg total) by mouth 2 (two) times daily with a meal.   60 tablet   3   . cholecalciferol (VITAMIN D) 1000 UNITS tablet   Oral   Take 1,000 Units by mouth daily.         . clopidogrel (PLAVIX) 75 MG tablet   Oral   Take 1 tablet (75 mg total) by mouth daily with breakfast.   30 tablet   3   . cyanocobalamin 1000 MCG tablet   Oral   Take 100 mcg by mouth daily.         . diphenhydramine-acetaminophen (TYLENOL PM) 25-500 MG TABS   Oral    Take 2 tablets by mouth at bedtime as needed. sleep         . hydrochlorothiazide (HYDRODIURIL) 25 MG tablet   Oral   Take 1 tablet (25 mg total) by mouth daily as needed. Swelling   30 tablet   3   . simvastatin (ZOCOR) 10 MG tablet   Oral   Take 1 tablet (10 mg total) by mouth daily at 6 PM.   30 tablet   0   . traMADol (ULTRAM) 50 MG tablet   Oral   Take 1 tablet (50 mg total) by mouth daily as needed. Take one tablet twice daily as needed for neck pain   30 tablet   0    BP 176/70  Pulse 62  Temp(Src) 97.6 F (36.4 C) (Oral)  Resp 14  SpO2 95% Physical Exam  Nursing note and vitals reviewed. Constitutional: She is oriented to person, place, and time. She appears well-developed and well-nourished. No distress.  HENT:  Head: Normocephalic and atraumatic.  Mouth/Throat: Oropharynx is clear and moist.  Eyes: Conjunctivae and EOM are normal. Pupils are equal, round, and reactive to light.  Neck: Normal range of motion.  Cardiovascular: Normal rate and regular rhythm.   No murmur heard. Pulmonary/Chest: Effort normal and breath sounds normal.  Abdominal: Soft. Bowel sounds are normal. There is no tenderness.  Musculoskeletal: Normal range of motion.  Neurological: She is alert and oriented to person, place, and time. No cranial nerve deficit. She exhibits normal muscle tone. Coordination normal.  Skin: Skin is warm. No rash noted.    ED Course  Procedures (including critical care time) Labs Review Labs Reviewed  CBC WITH DIFFERENTIAL - Abnormal; Notable for the following:    Eosinophils Relative 9 (*)    All other components within normal limits  BASIC METABOLIC PANEL - Abnormal; Notable for the following:    Glucose, Bld 123 (*)    BUN 25 (*)    GFR calc non Af Amer 49 (*)    GFR calc Af Amer 57 (*)    All other components within normal limits  PRO B NATRIURETIC PEPTIDE - Abnormal; Notable for the following:    Pro B Natriuretic peptide (BNP) 1296.0 (*)     All other components within normal limits   Results for orders placed during the hospital encounter of 05/07/13  CBC WITH DIFFERENTIAL      Result Value Ref Range   WBC 6.7  4.0 - 10.5 K/uL   RBC 4.37  3.87 - 5.11 MIL/uL   Hemoglobin 13.1  12.0 - 15.0 g/dL   HCT 16.1  09.6 - 04.5 %   MCV 87.2  78.0 - 100.0 fL   MCH 30.0  26.0 - 34.0 pg   MCHC 34.4  30.0 - 36.0 g/dL  RDW 13.6  11.5 - 15.5 %   Platelets 176  150 - 400 K/uL   Neutrophils Relative % 59  43 - 77 %   Neutro Abs 3.9  1.7 - 7.7 K/uL   Lymphocytes Relative 26  12 - 46 %   Lymphs Abs 1.8  0.7 - 4.0 K/uL   Monocytes Relative 5  3 - 12 %   Monocytes Absolute 0.4  0.1 - 1.0 K/uL   Eosinophils Relative 9 (*) 0 - 5 %   Eosinophils Absolute 0.6  0.0 - 0.7 K/uL   Basophils Relative 0  0 - 1 %   Basophils Absolute 0.0  0.0 - 0.1 K/uL  BASIC METABOLIC PANEL      Result Value Ref Range   Sodium 144  137 - 147 mEq/L   Potassium 4.2  3.7 - 5.3 mEq/L   Chloride 105  96 - 112 mEq/L   CO2 26  19 - 32 mEq/L   Glucose, Bld 123 (*) 70 - 99 mg/dL   BUN 25 (*) 6 - 23 mg/dL   Creatinine, Ser 1.610.99  0.50 - 1.10 mg/dL   Calcium 9.2  8.4 - 09.610.5 mg/dL   GFR calc non Af Amer 49 (*) >90 mL/min   GFR calc Af Amer 57 (*) >90 mL/min  PRO B NATRIURETIC PEPTIDE      Result Value Ref Range   Pro B Natriuretic peptide (BNP) 1296.0 (*) 0 - 450 pg/mL    Imaging Review Dg Chest 2 View  05/07/2013   CLINICAL DATA:  Hypertension and dizziness.  EXAM: CHEST - 2 VIEW  COMPARISON:  DG CHEST 2 VIEW dated 04/09/2013; DG CHEST 2 VIEW dated 11/10/2008  FINDINGS: The heart size and mediastinal contours are within normal limits. There is no evidence of pulmonary edema, consolidation, pneumothorax, nodule or pleural fluid. Stable minimal degenerative changes of the thoracic spine.  IMPRESSION: No active disease.   Electronically Signed   By: Irish LackGlenn  Yamagata M.D.   On: 05/07/2013 19:58   Ct Head Wo Contrast  05/07/2013   CLINICAL DATA:  High blood pressure, blurred  vision  EXAM: CT HEAD WITHOUT CONTRAST  TECHNIQUE: Contiguous axial images were obtained from the base of the skull through the vertex without intravenous contrast.  COMPARISON:  MR HEAD W/O CM dated 04/10/2013; CT HEAD W/O CM dated 04/09/2013  FINDINGS: No acute intracranial hemorrhage. No focal mass lesion. No CT evidence of acute infarction. No midline shift or mass effect. No hydrocephalus. Basilar cisterns are patent.  There is a a deep white matter infarction in the left centrum semiovale which corresponds findings on comparison MRI. Paranasal sinuses and mastoid air cells are clear.  IMPRESSION: 1. No acute intracranial findings. 2. Deep white matter infarction on the left corresponds to comparison MRI.   Electronically Signed   By: Genevive BiStewart  Edmunds M.D.   On: 05/07/2013 19:51    EKG Interpretation    Date/Time:  Wednesday May 07 2013 19:31:32 EST Ventricular Rate:  62 PR Interval:  197 QRS Duration: 145 QT Interval:  482 QTC Calculation: 489 R Axis:   58 Text Interpretation:  Sinus rhythm Atrial premature complex Right bundle branch block Abnrm T, consider ischemia, anterolateral lds No significant change since last tracing Confirmed by Mitcheal Sweetin  MD, Emmarae Cowdery ([id number]) on 05/07/2013 7:39:35 PM            MDM   Final diagnoses:  Hypertension    Patient presents with a brief history today of some  fuzziness with her vision and dizzy feeling that resolved within minutes. No recurrent symptoms. Patient took her blood pressure at that time it was significantly high 215/95. Patient checks her blood pressure regularly and normally she is more 140/70. Patient is on hydrochlorothiazide for hypertension in the past of when she has forgotten to take another blood pressure is gone high but she states that she took it today. Patient without headache patient without shortness of breath patient without chest pain patient without any focal deficits. Patient is status post the CVA January 21 patient  thought that was a hemorrhagic infarct but according to the MRI it was not nonhemorrhagic.   Patient blood pressure without any treatment is improved to 176/70. As stated we'll discuss with the neural hospitalist about any concerns for TIA and therefore requiring readmission. Patient remains completely asymptomatic. Labs without any significant abnormalities.   Shelda Jakes, MD 05/07/13 2021  Shelda Jakes, MD 05/07/13 2025  Discuss with neural hospitalist. The patient can be discharged home continue her Plavix. In discussion with them no need for MRI. Patient to return for any newer worse symptoms. Discuss with Dr. Roseanne Reno.  Shelda Jakes, MD 05/07/13 [phone]

## 2013-05-07 NOTE — Discharge Instructions (Signed)
Continue to check her blood pressure daily as you have been doing. Keep directed. Followup with your primary care doctor in the next few days for reevaluation of the blood pressure and your current medicine. Discussed with neurology at night they're not concerned about today's events and you can be discharged home. Return for any severe headache chest pain shortness of breath or any strokelike symptoms.

## 2013-05-07 NOTE — ED Notes (Signed)
Reporting today at 1730 was going to sit down and do a crossword puzzle noticed her eyes were fuzzy and she felt dizzy. Took her BP and it was 215/95 on her home machine. Reports had CVA 04/09/13 which resulted in right side weakness that has since improved and she was released from PT today. No new neuro deficits noted today. Pt is a x 4. "I feel fine". Denies any pain. Pt is responding appropriatetely.

## 2013-05-14 ENCOUNTER — Encounter: Payer: Self-pay | Admitting: Nurse Practitioner

## 2013-05-14 ENCOUNTER — Ambulatory Visit (INDEPENDENT_AMBULATORY_CARE_PROVIDER_SITE_OTHER): Payer: Medicare Other | Admitting: Nurse Practitioner

## 2013-05-14 VITALS — BP 164/88 | HR 67 | Temp 97.4°F | Resp 16 | Wt 160.0 lb

## 2013-05-14 DIAGNOSIS — I6350 Cerebral infarction due to unspecified occlusion or stenosis of unspecified cerebral artery: Secondary | ICD-10-CM

## 2013-05-14 DIAGNOSIS — I639 Cerebral infarction, unspecified: Secondary | ICD-10-CM

## 2013-05-14 DIAGNOSIS — I10 Essential (primary) hypertension: Secondary | ICD-10-CM

## 2013-05-14 MED ORDER — LISINOPRIL 2.5 MG PO TABS: 2.5000 mg | ORAL_TABLET | Freq: Every day | ORAL | Status: DC

## 2013-05-14 NOTE — Patient Instructions (Addendum)
Will add lisinopril 2.5mg  daily  To take blood pressure and HR daily and record 1-2 hours after taking medication while sitting and relaxed  Follow up if blood pressure becomes too low or remains high, HR is low, dizziness or lightheadedness occurs or if you have any other side effect from medication   Keep follow up with Dr Renato Gailseed and follow up before if needed

## 2013-05-14 NOTE — Progress Notes (Signed)
Patient ID: Lisa [name], female   DOB: 04/15/1921, 80 y.o.   MRN: [id number]    No Known Allergies  Chief Complaint  Patient presents with  . Follow-up    f/u from ER for BP being elevated on 05/07/13, BP 137/68 - 185/78.    HPI: Patient is a 80 y.o. female seen in the office today for follow up on high blood pressure; Patient went to ED with fuzziness with her vision and dizzy feeling that had resolved within minutes without recurrent symptoms and was noted to have blood pressure of 215/95. Had just had a CVA so she wanted to take caution, CT and chest xray and blood-- which all was normal; did not give any medication  Blood pressures at home have been 149-179/75-93 Review of Systems:  Review of Systems  Constitutional: Negative for fever, chills and malaise/fatigue.  Respiratory: Negative for cough and shortness of breath.   Cardiovascular: Negative for chest pain and palpitations.  Musculoskeletal: Negative for myalgias.  Neurological: Negative for dizziness, weakness and headaches.     Past Medical History  Diagnosis Date  . Unspecified vitamin D deficiency   . Regional enteritis of unspecified site   . Hyperlipidemia LDL goal < 100   . Osteoarthrosis, unspecified whether generalized or localized, lower leg   . Dermatophytosis of groin and perianal area   . Cervicalgia   . Insomnia, unspecified   . Chronic kidney disease, stage III (moderate)   . Type II or unspecified type diabetes mellitus with renal manifestations, not stated as uncontrolled   . Other abnormal glucose   . Other B-complex deficiencies   . Other malaise and fatigue   . Senile cataract, unspecified   . Unspecified essential hypertension   . Muscle weakness (generalized)   . Tachycardia, unspecified   . Palpitations   . Undiagnosed cardiac murmurs   . Shortness of breath    Past Surgical History  Procedure Laterality Date  . Tonsillectomy  1944  . Tubal ligation  1951  . Toe amputation  2002  .  Eye surgery     Social History:   reports that she has never smoked. She does not have any smokeless tobacco history on file. She reports that she does not drink alcohol or use illicit drugs.  History reviewed. No pertinent family history.  Medications: Patient's Medications  New Prescriptions   No medications on file  Previous Medications   CALCIUM-VITAMIN D (OSCAL WITH D) 500-200 MG-UNIT PER TABLET    Take 1 tablet by mouth.   CARVEDILOL (COREG) 6.25 MG TABLET    Take 1 tablet (6.25 mg total) by mouth 2 (two) times daily with a meal.   CHOLECALCIFEROL (VITAMIN D) 1000 UNITS TABLET    Take 1,000 Units by mouth daily.   CLOPIDOGREL (PLAVIX) 75 MG TABLET    Take 1 tablet (75 mg total) by mouth daily with breakfast.   CYANOCOBALAMIN 1000 MCG TABLET    Take 100 mcg by mouth daily.   DIPHENHYDRAMINE-ACETAMINOPHEN (TYLENOL PM) 25-500 MG TABS    Take 2 tablets by mouth at bedtime as needed. sleep   HYDROCHLOROTHIAZIDE (HYDRODIURIL) 25 MG TABLET    Take 1 tablet (25 mg total) by mouth daily as needed. Swelling   SIMVASTATIN (ZOCOR) 10 MG TABLET    Take 1 tablet (10 mg total) by mouth daily at 6 PM.   TRAMADOL (ULTRAM) 50 MG TABLET    Take 1 tablet (50 mg total) by mouth daily as needed. Take one  tablet twice daily as needed for neck pain  Modified Medications   No medications on file  Discontinued Medications   No medications on file     Physical Exam:  Filed Vitals:   05/14/13 1404  BP: 164/88  Pulse: 67  Temp: 97.4 F (36.3 C)  TempSrc: Oral  Resp: 16  Weight: 160 lb (72.576 kg)  SpO2: 97%    Physical Exam  Constitutional: She is oriented to person, place, and time and well-developed, well-nourished, and in no distress.  HENT:  Head: Normocephalic and atraumatic.  Eyes: Conjunctivae and EOM are normal. Pupils are equal, round, and reactive to light.  Cardiovascular: Normal rate, regular rhythm and normal heart sounds.   Pulmonary/Chest: Effort normal and breath sounds  normal. No respiratory distress. She has no wheezes.  Abdominal: Soft. Bowel sounds are normal.  Musculoskeletal: She exhibits no edema.  Neurological: She is alert and oriented to person, place, and time.  Skin: Skin is warm and dry.  Psychiatric: Affect normal.     Labs reviewed: Basic Metabolic Panel:  Recent Labs  40/98/11 0823 04/09/13 1658 05/07/13 1919  NA 141 141 144  K 4.5 4.8 4.2  CL 103 104 105  CO2 23  --  26  GLUCOSE 115* 121* 123*  BUN 26 36* 25*  CREATININE 1.22* 1.10 0.99  CALCIUM 9.1  --  9.2   Liver Function Tests: No results found for this basename: AST, ALT, ALKPHOS, BILITOT, PROT, ALBUMIN,  in the last 8760 hours No results found for this basename: LIPASE, AMYLASE,  in the last 8760 hours No results found for this basename: AMMONIA,  in the last 8760 hours CBC:  Recent Labs  09/10/12 0823 04/09/13 1648 04/09/13 1658 05/07/13 1919  WBC 6.5 6.3  --  6.7  NEUTROABS 4.0 4.0  --  3.9  HGB 13.9 14.0 14.6 13.1  HCT 41.0 41.6 43.0 38.1  MCV 88 88.3  --  87.2  PLT  --  184  --  176   Lipid Panel:  Recent Labs  09/10/12 0823 04/10/13 0700  CHOL  --  195  HDL 25* 25*  LDLCALC 114* 914*  TRIG 246* 241*  CHOLHDL 7.5* 7.8     Assessment/Plan 1. Stroke -prior CVA, remains stable, conts to work with therapy, on plavix, coreg, zocor   2. Essential hypertension, benign -blood pressure still elevated, daughter explained pt needs higher blood pressure and does not tolerate "normal blood pressure" like most ppl however on review of BP sbp is elevated as high as 200 and staying well above goal of sbp in the 140-150s Will add lisinopril low dose 2.5mg  daily  To take blood pressure and HR daily and record 1-2 hours after taking medication while sitting and relaxed To Follow up if blood pressure becomes too low or remains high, HR is low, dizziness or lightheadedness occurs or if you have any other side effect from medication   Keep follow up with Dr  Renato Gails and has blood work before visit

## 2013-05-27 ENCOUNTER — Telehealth: Payer: Self-pay | Admitting: *Deleted

## 2013-05-27 ENCOUNTER — Other Ambulatory Visit: Payer: Self-pay | Admitting: *Deleted

## 2013-05-27 DIAGNOSIS — I10 Essential (primary) hypertension: Secondary | ICD-10-CM

## 2013-05-27 MED ORDER — LISINOPRIL 2.5 MG PO TABS: ORAL_TABLET | ORAL | Status: DC

## 2013-05-27 NOTE — Telephone Encounter (Signed)
Patient Notified and changed medication in chart

## 2013-05-27 NOTE — Telephone Encounter (Signed)
Patient called and stated that the BP medication that was added at the appointment is not working. BP in the evenings is still running around the 200's. In the mornings it is good but the evening it gets worse. Please Advise   [facility]

## 2013-05-27 NOTE — Telephone Encounter (Signed)
May increase lisinopril to 5 mg daily

## 2013-06-04 ENCOUNTER — Other Ambulatory Visit: Payer: Self-pay | Admitting: Internal Medicine

## 2013-06-24 ENCOUNTER — Other Ambulatory Visit: Payer: Medicare Other

## 2013-06-24 DIAGNOSIS — N183 Chronic kidney disease, stage 3 unspecified: Secondary | ICD-10-CM

## 2013-06-24 DIAGNOSIS — E1129 Type 2 diabetes mellitus with other diabetic kidney complication: Secondary | ICD-10-CM

## 2013-06-24 DIAGNOSIS — E785 Hyperlipidemia, unspecified: Secondary | ICD-10-CM

## 2013-06-25 LAB — BASIC METABOLIC PANEL
BUN/Creatinine Ratio: 22 (ref 11–26)
BUN: 24 mg/dL (ref 8–27)
CO2: 26 mmol/L (ref 18–29)
Calcium: 9.3 mg/dL (ref 8.7–10.3)
Chloride: 103 mmol/L (ref 97–108)
Creatinine, Ser: 1.1 mg/dL — ABNORMAL HIGH (ref 0.57–1.00)
GFR calc Af Amer: 51 mL/min/{1.73_m2} — ABNORMAL LOW (ref >59)
GFR calc non Af Amer: 45 mL/min/{1.73_m2} — ABNORMAL LOW (ref >59)
Glucose: 126 mg/dL — ABNORMAL HIGH (ref 65–99)
Potassium: 4.6 mmol/L (ref 3.5–5.2)
Sodium: 144 mmol/L (ref 134–144)

## 2013-06-25 LAB — LIPID PANEL
Chol/HDL Ratio: 5.7 ratio units — ABNORMAL HIGH (ref 0.0–4.4)
Cholesterol, Total: 165 mg/dL (ref 100–199)
HDL: 29 mg/dL — ABNORMAL LOW (ref >39)
LDL Calculated: 96 mg/dL (ref 0–99)
Triglycerides: 199 mg/dL — ABNORMAL HIGH (ref 0–149)
VLDL Cholesterol Cal: 40 mg/dL (ref 5–40)

## 2013-06-25 LAB — CBC WITH DIFFERENTIAL/PLATELET
Basophils Absolute: 0 10*3/uL (ref 0.0–0.2)
Basos: 1 %
Eos: 6 %
Eosinophils Absolute: 0.4 10*3/uL (ref 0.0–0.4)
HCT: 40.7 % (ref 34.0–46.6)
Hemoglobin: 13.5 g/dL (ref 11.1–15.9)
Immature Grans (Abs): 0 10*3/uL (ref 0.0–0.1)
Immature Granulocytes: 0 %
Lymphocytes Absolute: 1.6 10*3/uL (ref 0.7–3.1)
Lymphs: 24 %
MCH: 29.7 pg (ref 26.6–33.0)
MCHC: 33.2 g/dL (ref 31.5–35.7)
MCV: 90 fL (ref 79–97)
Monocytes Absolute: 0.4 10*3/uL (ref 0.1–0.9)
Monocytes: 6 %
Neutrophils Absolute: 4.2 10*3/uL (ref 1.4–7.0)
Neutrophils Relative %: 63 %
RBC: 4.55 x10E6/uL (ref 3.77–5.28)
RDW: 14.7 % (ref 12.3–15.4)
WBC: 6.7 10*3/uL (ref 3.4–10.8)

## 2013-06-26 ENCOUNTER — Other Ambulatory Visit: Payer: Self-pay | Admitting: *Deleted

## 2013-06-26 ENCOUNTER — Ambulatory Visit (INDEPENDENT_AMBULATORY_CARE_PROVIDER_SITE_OTHER): Payer: Medicare Other | Admitting: Internal Medicine

## 2013-06-26 ENCOUNTER — Encounter: Payer: Self-pay | Admitting: Internal Medicine

## 2013-06-26 VITALS — BP 138/80 | HR 66 | Temp 97.1°F | Resp 18 | Ht 67.0 in | Wt 161.2 lb

## 2013-06-26 DIAGNOSIS — I6350 Cerebral infarction due to unspecified occlusion or stenosis of unspecified cerebral artery: Secondary | ICD-10-CM

## 2013-06-26 DIAGNOSIS — J302 Other seasonal allergic rhinitis: Secondary | ICD-10-CM

## 2013-06-26 DIAGNOSIS — I639 Cerebral infarction, unspecified: Secondary | ICD-10-CM

## 2013-06-26 DIAGNOSIS — E1129 Type 2 diabetes mellitus with other diabetic kidney complication: Secondary | ICD-10-CM

## 2013-06-26 DIAGNOSIS — N183 Chronic kidney disease, stage 3 unspecified: Secondary | ICD-10-CM

## 2013-06-26 DIAGNOSIS — N189 Chronic kidney disease, unspecified: Secondary | ICD-10-CM

## 2013-06-26 DIAGNOSIS — J309 Allergic rhinitis, unspecified: Secondary | ICD-10-CM

## 2013-06-26 DIAGNOSIS — I129 Hypertensive chronic kidney disease with stage 1 through stage 4 chronic kidney disease, or unspecified chronic kidney disease: Secondary | ICD-10-CM | POA: Insufficient documentation

## 2013-06-26 DIAGNOSIS — E785 Hyperlipidemia, unspecified: Secondary | ICD-10-CM

## 2013-06-26 MED ORDER — CLOPIDOGREL BISULFATE 75 MG PO TABS: 75.0000 mg | ORAL_TABLET | Freq: Every day | ORAL | Status: DC

## 2013-06-26 MED ORDER — CARVEDILOL 12.5 MG PO TABS: 12.5000 mg | ORAL_TABLET | Freq: Two times a day (BID) | ORAL | Status: DC

## 2013-06-26 MED ORDER — SIMVASTATIN 10 MG PO TABS: 10.0000 mg | ORAL_TABLET | Freq: Every day | ORAL | Status: DC

## 2013-06-26 MED ORDER — CLOPIDOGREL BISULFATE 75 MG PO TABS
75.0000 mg | ORAL_TABLET | Freq: Every day | ORAL | Status: DC
Start: 2013-06-26 — End: 2013-06-26

## 2013-06-26 NOTE — Progress Notes (Signed)
Patient ID: Lisa [name], female   DOB: September 22, 1921, 80 y.o.   MRN: [id number]   Location:  [facility]  Code Status: DNR  No Known Allergies  Chief Complaint  Patient presents with  . Follow-up    weak, tired,started today,    HPI: Patient is a 80 y.o. white female seen in the office today for medical mgt of chronic diseases.  She feels weak and tired this am.  Is having increased cough with lisinopril.  Has continued to take it.  Says she cannot get through to find out.   Asks about driving.  She is alert and oriented.  No falls, fully functional, no focal weakness.    Review of Systems:  Review of Systems  Constitutional: Negative for fever.  HENT: Positive for congestion.        Allergic rhinitis  Eyes: Negative for blurred vision.  Respiratory: Positive for cough. Negative for shortness of breath.   Cardiovascular: Negative for chest pain and leg swelling.  Gastrointestinal: Negative for abdominal pain and constipation.  Genitourinary: Negative for dysuria.  Musculoskeletal: Negative for falls and myalgias.  Skin: Negative for rash.  Neurological: Negative for dizziness, loss of consciousness and headaches.  Endo/Heme/Allergies: Bruises/bleeds easily.  Psychiatric/Behavioral: Negative for depression and memory loss.    Past Medical History  Diagnosis Date  . Unspecified vitamin D deficiency   . Regional enteritis of unspecified site   . Hyperlipidemia LDL goal < 100   . Osteoarthrosis, unspecified whether generalized or localized, lower leg   . Dermatophytosis of groin and perianal area   . Cervicalgia   . Insomnia, unspecified   . Chronic kidney disease, stage III (moderate)   . Type II or unspecified type diabetes mellitus with renal manifestations, not stated as uncontrolled   . Other abnormal glucose   . Other B-complex deficiencies   . Other malaise and fatigue   . Senile cataract, unspecified   . Unspecified  essential hypertension   . Muscle weakness (generalized)   . Tachycardia, unspecified   . Palpitations   . Undiagnosed cardiac murmurs   . Shortness of breath     Past Surgical History  Procedure Laterality Date  . Tonsillectomy  1944  . Tubal ligation  1951  . Toe amputation  2002  . Eye surgery      Social History:   reports that she has never smoked. She does not have any smokeless tobacco history on file. She reports that she does not drink alcohol or use illicit drugs.  No family history on file.  Medications: Patient's Medications  New Prescriptions   No medications on file  Previous Medications   CALCIUM-VITAMIN D (OSCAL WITH D) 500-200 MG-UNIT PER TABLET    Take 1 tablet by mouth.   CARVEDILOL (COREG) 6.25 MG TABLET    Take 1 tablet (6.25 mg total) by mouth 2 (two) times daily with a meal.   CHOLECALCIFEROL (VITAMIN D) 1000 UNITS TABLET    Take 1,000 Units by mouth daily.   CLOPIDOGREL (PLAVIX) 75 MG TABLET    Take 1 tablet (75 mg total) by mouth daily with breakfast.   CYANOCOBALAMIN 1000 MCG TABLET    Take 100 mcg by mouth daily.   DIPHENHYDRAMINE-ACETAMINOPHEN (TYLENOL PM) 25-500 MG TABS    Take 2 tablets by mouth at bedtime as needed. sleep   HYDROCHLOROTHIAZIDE (HYDRODIURIL) 25 MG TABLET    Take 1 tablet (25 mg total) by mouth daily as needed.  Swelling   LISINOPRIL (PRINIVIL,ZESTRIL) 2.5 MG TABLET    Take two tablets by mouth once daily to control blood pressure   SIMVASTATIN (ZOCOR) 10 MG TABLET    TAKE ONE TABLET BY MOUTH ONCE DAILY AT 6 PM   TRAMADOL (ULTRAM) 50 MG TABLET    Take 1 tablet (50 mg total) by mouth daily as needed. Take one tablet twice daily as needed for neck pain  Modified Medications   No medications on file  Discontinued Medications   No medications on file     Physical Exam: Filed Vitals:   06/26/13 1042  BP: 138/80  Pulse: 66  Temp: 97.1 F (36.2 C)  TempSrc: Oral  Resp: 18  Height: 5\' 7"  (1.702 m)  Weight: 161 lb 3.2 oz (73.12  kg)  SpO2: 97%  Physical Exam  Constitutional: She is oriented to person, place, and time. She appears well-developed and well-nourished. No distress.  Cardiovascular: Normal rate, regular rhythm, normal heart sounds and intact distal pulses.   Pulmonary/Chest: Effort normal and breath sounds normal. No respiratory distress.  Abdominal: Soft. Bowel sounds are normal. She exhibits no distension and no mass. There is no tenderness.  Musculoskeletal: Normal range of motion. She exhibits no edema and no tenderness.  Neurological: She is alert and oriented to person, place, and time.  Skin: Skin is warm and dry.  Psychiatric: She has a normal mood and affect.     Labs reviewed: Basic Metabolic Panel:  Recent Labs  16/10/96 0823 04/09/13 1658 05/07/13 1919 06/24/13 0813  NA 141 141 144 144  K 4.5 4.8 4.2 4.6  CL 103 104 105 103  CO2 23  --  26 26  GLUCOSE 115* 121* 123* 126*  BUN 26 36* 25* 24  CREATININE 1.22* 1.10 0.99 1.10*  CALCIUM 9.1  --  9.2 9.3  CBC:  Recent Labs  09/10/12 0823 04/09/13 1648 04/09/13 1658 05/07/13 1919 06/24/13 0813  WBC 6.5 6.3  --  6.7 6.7  NEUTROABS 4.0 4.0  --  3.9 4.2  HGB 13.9 14.0 14.6 13.1 13.5  HCT 41.0 41.6 43.0 38.1 40.7  MCV 88 88.3  --  87.2 90  PLT  --  184  --  176  --    Lipid Panel:  Recent Labs  09/10/12 0823 04/10/13 0700 06/24/13 0813  CHOL  --  195  --   HDL 25* 25* 29*  LDLCALC 114* 122* 96  TRIG 246* 241* 199*  CHOLHDL 7.5* 7.8 5.7*   Lab Results  Component Value Date   HGBA1C 6.4* 04/10/2013   Assessment/Plan 1. Type II or unspecified type diabetes mellitus with renal manifestations, not stated as uncontrolled - will f/u hba1c next time and watching sweets and starches - simvastatin (ZOCOR) 10 MG tablet; Take 1 tablet (10 mg total) by mouth daily at 6 PM.  Dispense: 30 tablet; Refill: 3 - Comprehensive metabolic panel; Future - Lipid panel; Future - Hemoglobin A1c; Future  2. Stroke - cont current  meds--given three mos supply of her meds for secondary prevention--bp has been high so increased coreg at her request rather than adding another agent - carvedilol (COREG) 12.5 MG tablet; Take 1 tablet (12.5 mg total) by mouth 2 (two) times daily with a meal. For blood pressure  Dispense: 60 tablet; Refill: 3 - simvastatin (ZOCOR) 10 MG tablet; Take 1 tablet (10 mg total) by mouth daily at 6 PM.  Dispense: 30 tablet; Refill: 3 - Comprehensive metabolic panel; Future  3.  Chronic kidney disease, stage III (moderate) -kidneys have been stable, but ace had to be stopped due to her cough--entered in contraindications - Comprehensive metabolic panel; Future  4. Hyperlipidemia LDL goal < 100 -I do not think her generalized weakness that she had just this morning was due to her statin--this is working well for her cholesterol so I continued it--she has no muscle pain and is able to "get up and go" - simvastatin (ZOCOR) 10 MG tablet; Take 1 tablet (10 mg total) by mouth daily at 6 PM.  Dispense: 30 tablet; Refill: 3 - Comprehensive metabolic panel; Future - Lipid panel; Future  5. Seasonal allergies -advised she may short term use zyrtec  6. Hypertension, renal disease -bp was elevated -cannot take ace due to cough -increased coreg from 6.25mg  po bid to 12.5mg  po bid  Labs/tests ordered: Orders Placed This Encounter  Procedures  . Comprehensive metabolic panel    Standing Status: Future     Number of Occurrences:      Standing Expiration Date: 12/26/2013  . Lipid panel    Standing Status: Future     Number of Occurrences:      Standing Expiration Date: 12/26/2013  . Hemoglobin A1c    Standing Status: Future     Number of Occurrences:      Standing Expiration Date: 12/26/2013    Next appt:  3 mos labs before

## 2013-06-26 NOTE — Patient Instructions (Signed)
Please continue to monitor your blood pressure daily about one hour after taking your bp medications.  We stopped the lisinopril due to your cough.  We increased the coreg (carvedilol) to 12.5mg  from 6.25mg  twice a day.  If your blood pressure remains over 150/90, please take your hydrochlorothiazide also.  If it is still remaining over 150/90, let me know so I can make changes.  Ask the staff to send me a message.

## 2013-07-04 ENCOUNTER — Ambulatory Visit (INDEPENDENT_AMBULATORY_CARE_PROVIDER_SITE_OTHER): Payer: Medicare Other | Admitting: Nurse Practitioner

## 2013-07-04 ENCOUNTER — Encounter: Payer: Self-pay | Admitting: Nurse Practitioner

## 2013-07-04 VITALS — BP 142/75 | HR 68 | Ht 65.0 in | Wt 163.0 lb

## 2013-07-04 DIAGNOSIS — I639 Cerebral infarction, unspecified: Secondary | ICD-10-CM

## 2013-07-04 DIAGNOSIS — R531 Weakness: Secondary | ICD-10-CM

## 2013-07-04 DIAGNOSIS — M6281 Muscle weakness (generalized): Secondary | ICD-10-CM

## 2013-07-04 DIAGNOSIS — I6350 Cerebral infarction due to unspecified occlusion or stenosis of unspecified cerebral artery: Secondary | ICD-10-CM

## 2013-07-04 NOTE — Patient Instructions (Signed)
PLAN:  Continue clopidogrel 75 mg orally every day  for secondary stroke prevention and maintain strict control of hypertension with blood pressure goal below 130/90, diabetes with hemoglobin A1c goal below 6.5% and lipids with LDL cholesterol goal below 70 mg/dL. Followup in 3 months, sooner as needed.  STROKE/TIA INSTRUCTIONS SMOKING Cigarette smoking nearly doubles your risk of having a stroke & is the single most alterable risk factor  If you smoke or have smoked in the last 12 months, you are advised to quit smoking for your health.  Most of the excess cardiovascular risk related to smoking disappears within a year of stopping.  Ask you doctor about anti-smoking medications   Quit Line: 1-800-QUIT NOW  Free Smoking Cessation Classes [phone]  CHOLESTEROL Know your levels; limit fat & cholesterol in your diet  Lab Results  Component Value Date   CHOL 195 04/10/2013   HDL 29* 06/24/2013   LDLCALC 96 06/24/2013   TRIG 199* 06/24/2013   CHOLHDL 5.7* 06/24/2013      Many patients benefit from treatment even if their cholesterol is at goal.  Goal: Total Cholesterol less than 160  Goal:  LDL less than 100  Goal:  HDL greater than 40  Goal:  Triglycerides less than 150  BLOOD PRESSURE American Stroke Association blood pressure target is less that 120/80 mm/Hg  Your discharge blood pressure is:     Monitor your blood pressure  Limit your salt and alcohol intake  Many individuals will require more than one medication for high blood pressure  DIABETES (A1c is a blood sugar average for last 3 months) Goal A1c is under 7% (A1c is blood sugar average for last 3 months)  Diabetes: Diagnosis of diabetes:  A1c was not drawn this admission    Lab Results  Component Value Date   HGBA1C 6.4* 04/10/2013    Your A1c can be lowered with medications, healthy diet, and exercise.  Check your blood sugar as directed by your physician  Call your physician if you experience unexplained or  low blood sugars.  PHYSICAL ACTIVITY/REHABILITATION Goal is 30 minutes at least 4 days per week    Activity decreases your risk of heart attack and stroke and makes your heart stronger.  It helps control your weight and blood pressure; helps you relax and can improve your mood.  Participate in a regular exercise program.  Talk with your doctor about the best form of exercise for you (dancing, walking, swimming, cycling).  DIET/WEIGHT Goal is to maintain a healthy weight  Your height is:  Height: 5\' 5"  (165.1 cm) Your current weight is: Weight: 163 lb (73.936 kg) Your body Mass Index (BMI) is:  BMI (Calculated): 27.2  Following the type of diet specifically designed for you will help prevent another stroke.  Your goal Body Mass Index (BMI) is 19-24.  Healthy food habits can help reduce 3 risk factors for stroke:  High cholesterol, hypertension, and excess weight.

## 2013-07-04 NOTE — Progress Notes (Signed)
PATIENT: Lisa [name] DOB: 07/10/1921  REASON FOR VISIT: hospital stroke follow up HISTORY FROM: patient  HISTORY OF PRESENT ILLNESS: Lisa [name] is an 80 y.o. female history of hypertension and hyperlipidemia who developed acute weakness involving right arm and leg on 04/09/2013. Patient's voice was a low-volume in speech was slow but not particularly slurred. Facial drooping was noted, however. Patient has no history of stroke nor TIA. She was not been taking antiplatelet therapy on a daily basis. CT scan of her head showed no acute intracranial abnormality. Deficits had improved quickly. NIH stroke score was 0. Patient was not administerd TPA secondary to Beyond time window for TPA consideration arrived in the ER; rapidly resolving deficits. She was admitted for further evaluation and treatment.  MRI of the brain showed a Small, acute infarct in the left corona radiata. Mild chronic small vessel ischemic disease.  MRA of the brain showed No evidence of major intracranial arterial occlusion or flow-limiting stenosis. Mild anterior and posterior circulation branch vessel irregularity and moderate irregular narrowing of the left M1 segment, consistent with intracranial atherosclerosis.   2D Echocardiogram revealed an EF of 60-65% with no source of embolus.  Carotid Doppler showed no evidence of hemodynamically significant internal carotid artery stenosis. Last total cholesterol was 165, HDL 29, LDL 96.  She states her blood pressure had been higher than goal and she is working with Dr. Renato Gailseed on that.  Her BP in the office today is 142/75.  She is tolerating Plavix well with no signs of significant bleeding or bruising.  She has completed outaptient PT and OT.  REVIEW OF SYSTEMS: Full 14 system review of systems performed and notable only for: activity change  ALLERGIES: Allergies  Allergen Reactions  . Ace Inhibitors Cough    HOME MEDICATIONS: Outpatient Prescriptions Prior to Visit    Medication Sig Dispense Refill  . calcium-vitamin D (OSCAL WITH D) 500-200 MG-UNIT per tablet Take 1 tablet by mouth.      . carvedilol (COREG) 12.5 MG tablet Take 1 tablet (12.5 mg total) by mouth 2 (two) times daily with a meal. For blood pressure  60 tablet  3  . cholecalciferol (VITAMIN D) 1000 UNITS tablet Take 1,000 Units by mouth daily.      . clopidogrel (PLAVIX) 75 MG tablet Take 1 tablet (75 mg total) by mouth daily with breakfast.  30 tablet  3  . cyanocobalamin 1000 MCG tablet Take 100 mcg by mouth daily.      . diphenhydramine-acetaminophen (TYLENOL PM) 25-500 MG TABS Take 2 tablets by mouth at bedtime as needed. sleep      . hydrochlorothiazide (HYDRODIURIL) 25 MG tablet Take 1 tablet (25 mg total) by mouth daily as needed. Swelling  30 tablet  3  . simvastatin (ZOCOR) 10 MG tablet Take 1 tablet (10 mg total) by mouth daily at 6 PM.  30 tablet  3  . traMADol (ULTRAM) 50 MG tablet Take 1 tablet (50 mg total) by mouth daily as needed. Take one tablet twice daily as needed for neck pain  30 tablet  0   No facility-administered medications prior to visit.     PHYSICAL EXAM  Filed Vitals:   07/04/13 1326  BP: 142/75  Pulse: 68  Height: 5\' 5"  (1.651 m)  Weight: 163 lb (73.936 kg)   Body mass index is 27.12 kg/(m^2).  Generalized: Well developed, in no acute distress  Head: normocephalic and atraumatic. Oropharynx benign  Neck: Supple, no carotid bruits  Cardiac: Regular rate rhythm, no murmur  Musculoskeletal: No deformity   Neurological examination  Mentation: Alert oriented to time, place, history taking. Follows all commands speech and language fluent Cranial nerve II-XII: Fundoscopic exam reveals sharp disc margins.Pupils were equal round reactive to light extraocular movements were full, visual field were full on confrontational test. Facial sensation and strength were normal. hearing was intact to finger rubbing bilaterally. Uvula tongue midline. head turning and  shoulder shrug and were normal and symmetric.Tongue protrusion into cheek strength was normal. Motor: The motor testing reveals 5 over 5 strength of all 4 extremities. Good symmetric motor tone is noted throughout. Mild weaker hand grip on the right. Decreased fine motor skills in right hand. Sensory: Sensory testing is intact to pinprick, soft touch, vibration sensation, and position sense on all 4 extremities. No evidence of extinction is noted.  Coordination: Cerebellar testing reveals good finger-nose-finger and heel-to-shin bilaterally.  Gait and station: Gait is normal. Unable to toe, heel or tandem walk. Romberg is negative. No drift is seen.  Reflexes: Deep tendon reflexes are symmetric and normal for age bilaterally. NIHSS: 0 mRS: 1  ASSESSMENT AND PLAN [name] is a 80 y.o. female who suffered a small left corona radiata infarct on 04/09/13. Infarct etiology felt to be thrombotic due to small vessel disease. Patient with resultant very mild right hemiparesis. Vascular risk factors include Hyperlipidemia, Diabetes, Hypertension.  PLAN:  Continue clopidogrel 75 mg orally every day  for secondary stroke prevention and maintain strict control of hypertension with blood pressure goal below 130/90, diabetes with hemoglobin A1c goal below 6.5% and lipids with LDL cholesterol goal below 70 mg/dL. Followup in 6 months, sooner as needed.   Ronal FearLYNN E. LAM, MSN, NP-C 07/04/2013, 1:49 PM [facility] [address], [address] [place], [place] [address] [phone]  Note: This document was prepared with digital dictation and possible smart phrase technology. Any transcriptional errors that result from this process are unintentional.

## 2013-07-10 ENCOUNTER — Ambulatory Visit: Payer: Medicare Other | Admitting: Neurology

## 2013-07-14 ENCOUNTER — Encounter: Payer: Self-pay | Admitting: Internal Medicine

## 2013-07-14 ENCOUNTER — Ambulatory Visit (INDEPENDENT_AMBULATORY_CARE_PROVIDER_SITE_OTHER): Payer: Medicare Other | Admitting: Internal Medicine

## 2013-07-14 VITALS — BP 130/78 | HR 66 | Temp 98.0°F | Resp 10 | Wt 165.2 lb

## 2013-07-14 DIAGNOSIS — I129 Hypertensive chronic kidney disease with stage 1 through stage 4 chronic kidney disease, or unspecified chronic kidney disease: Secondary | ICD-10-CM

## 2013-07-14 DIAGNOSIS — R0982 Postnasal drip: Secondary | ICD-10-CM

## 2013-07-14 DIAGNOSIS — N189 Chronic kidney disease, unspecified: Secondary | ICD-10-CM

## 2013-07-14 MED ORDER — MOMETASONE FUROATE 50 MCG/ACT NA SUSP: 2.0000 | Freq: Every day | NASAL | Status: DC

## 2013-07-14 NOTE — Progress Notes (Signed)
Patient ID: Lisa [name], female   DOB: 12/01/1921, 80 y.o.   MRN: [id number]   Location:  [facility]  Code Status: DNR  Allergies  Allergen Reactions  . Ace Inhibitors Cough    Chief Complaint  Patient presents with  . Cough    Patient c/o cough ongoing since last OV     HPI: Patient is a 80 y.o. white female seen in the office today for an acute visit due to ongoing cough since last visit.  Took 12 zyrtec and still having cough.  Zyrtec did help a little with the cough.  It wakes her up at night.    BP since last time:  Fluctuates from 120s-180s/70s-130s. Only one that high, but others have been as high as 160s.  Even high when she is just at home.  HR today 66.    Review of Systems:  Review of Systems  Constitutional: Negative for fever, chills, weight loss and malaise/fatigue.  HENT: Positive for congestion.   Eyes: Negative for blurred vision.  Respiratory: Positive for cough. Negative for shortness of breath.   Cardiovascular: Negative for chest pain.  Gastrointestinal: Negative for abdominal pain and diarrhea.  Musculoskeletal: Negative for falls and myalgias.  Skin: Negative for rash.  Neurological: Positive for headaches. Negative for dizziness, loss of consciousness and weakness.  Endo/Heme/Allergies: Bruises/bleeds easily.  Psychiatric/Behavioral: Negative for memory loss.    Past Medical History  Diagnosis Date  . Unspecified vitamin D deficiency   . Hyperlipidemia LDL goal < 100   . Osteoarthrosis, unspecified whether generalized or localized, lower leg   . Dermatophytosis of groin and perianal area   . Cervicalgia   . Insomnia, unspecified   . Chronic kidney disease, stage III (moderate)   . Type II or unspecified type diabetes mellitus with renal manifestations, not stated as uncontrolled   . Other abnormal glucose   . Other B-complex deficiencies   . Other malaise and fatigue   . Senile cataract,  unspecified   . Unspecified essential hypertension   . Muscle weakness (generalized)   . Tachycardia, unspecified   . Palpitations   . Undiagnosed cardiac murmurs   . Shortness of breath     Past Surgical History  Procedure Laterality Date  . Tonsillectomy  1944  . Tubal ligation  1951  . Toe amputation  2002  . Eye surgery      Social History:   reports that she has never smoked. She does not have any smokeless tobacco history on file. She reports that she does not drink alcohol or use illicit drugs.  Family History  Problem Relation Age of Onset  . Cerebral aneurysm Mother   . Heart attack Father     Medications: Patient's Medications  New Prescriptions   No medications on file  Previous Medications   CALCIUM-VITAMIN D (OSCAL WITH D) 500-200 MG-UNIT PER TABLET    Take 1 tablet by mouth.   CARVEDILOL (COREG) 12.5 MG TABLET    Take 1 tablet (12.5 mg total) by mouth 2 (two) times daily with a meal. For blood pressure   CHOLECALCIFEROL (VITAMIN D) 1000 UNITS TABLET    Take 1,000 Units by mouth daily.   CLOPIDOGREL (PLAVIX) 75 MG TABLET    Take 1 tablet (75 mg total) by mouth daily with breakfast.   CYANOCOBALAMIN 1000 MCG TABLET    Take 100 mcg by mouth daily.   DIPHENHYDRAMINE-ACETAMINOPHEN (TYLENOL PM) 25-500 MG TABS  Take 2 tablets by mouth at bedtime as needed. sleep   HYDROCHLOROTHIAZIDE (HYDRODIURIL) 25 MG TABLET    Take 1 tablet (25 mg total) by mouth daily as needed. Swelling   SIMVASTATIN (ZOCOR) 10 MG TABLET    Take 1 tablet (10 mg total) by mouth daily at 6 PM.   TRAMADOL (ULTRAM) 50 MG TABLET    Take 1 tablet (50 mg total) by mouth daily as needed. Take one tablet twice daily as needed for neck pain  Modified Medications   No medications on file  Discontinued Medications   No medications on file     Physical Exam: Filed Vitals:   07/14/13 1459  BP: 130/78  Pulse: 66  Temp: 98 F (36.7 C)  TempSrc: Oral  Resp: 10  Weight: 165 lb 3.2 oz (74.934 kg)    SpO2: 98%  Physical Exam  Constitutional: She is oriented to person, place, and time. She appears well-developed and well-nourished. No distress.  HENT:  Head: Normocephalic and atraumatic.  Mouth/Throat: Oropharyngeal exudate present.  Postnasal drip; sinus tenderness over maxillary sinuses  Cardiovascular: Normal rate, regular rhythm and intact distal pulses.   Pulmonary/Chest: Effort normal and breath sounds normal. No respiratory distress.  Abdominal: Soft. Bowel sounds are normal. She exhibits no distension and no mass. There is no tenderness.  Musculoskeletal: Normal range of motion. She exhibits no edema and no tenderness.  Lymphadenopathy:    She has no cervical adenopathy.  Neurological: She is alert and oriented to person, place, and time.  Skin: Skin is warm and dry.  Psychiatric: She has a normal mood and affect.    Labs reviewed: Basic Metabolic Panel:  Recent Labs  16/12/9604/24/14 0823 04/09/13 1658 05/07/13 1919 06/24/13 0813  NA 141 141 144 144  K 4.5 4.8 4.2 4.6  CL 103 104 105 103  CO2 23  --  26 26  GLUCOSE 115* 121* 123* 126*  BUN 26 36* 25* 24  CREATININE 1.22* 1.10 0.99 1.10*  CALCIUM 9.1  --  9.2 9.3  CBC:  Recent Labs  09/10/12 0823 04/09/13 1648 04/09/13 1658 05/07/13 1919 06/24/13 0813  WBC 6.5 6.3  --  6.7 6.7  NEUTROABS 4.0 4.0  --  3.9 4.2  HGB 13.9 14.0 14.6 13.1 13.5  HCT 41.0 41.6 43.0 38.1 40.7  MCV 88 88.3  --  87.2 90  PLT  --  184  --  176  --    Lipid Panel:  Recent Labs  09/10/12 0823 04/10/13 0700 06/24/13 0813  CHOL  --  195  --   HDL 25* 25* 29*  LDLCALC 114* 122* 96  TRIG 246* 241* 199*  CHOLHDL 7.5* 7.8 5.7*   Lab Results  Component Value Date   HGBA1C 6.4* 04/10/2013    Assessment/Plan 1. Postnasal drip -advised that no medication may fix this altogether -I think this is allergies -does not feel that zyrtec helped enough while she took it to restart -will try nasal spray otc first, and if ineffective,  advised to try either allegra or claritin instead - mometasone (NASONEX) 50 MCG/ACT nasal spray; Place 2 sprays into the nose daily.  Dispense: 17 g; Refill: 3 -reminded to spray to the outside of the nose not the septum to prevent blood noses 2. Hypertension, renal disease -bp usually satisfactory with a few rare outliers as in hpi -if elevated at next appt, would increase medication again  Labs/tests ordered:No orders of the defined types were placed in this encounter.  Next appt:  Keep as scheduled, plus prn

## 2013-09-25 ENCOUNTER — Other Ambulatory Visit: Payer: Medicare Other

## 2013-09-25 DIAGNOSIS — N183 Chronic kidney disease, stage 3 unspecified: Secondary | ICD-10-CM

## 2013-09-25 DIAGNOSIS — E1129 Type 2 diabetes mellitus with other diabetic kidney complication: Secondary | ICD-10-CM

## 2013-09-25 DIAGNOSIS — I639 Cerebral infarction, unspecified: Secondary | ICD-10-CM

## 2013-09-25 DIAGNOSIS — E785 Hyperlipidemia, unspecified: Secondary | ICD-10-CM

## 2013-09-26 LAB — LIPID PANEL
Chol/HDL Ratio: 7.8 ratio units — ABNORMAL HIGH (ref 0.0–4.4)
Cholesterol, Total: 171 mg/dL (ref 100–199)
HDL: 22 mg/dL — ABNORMAL LOW (ref >39)
LDL Calculated: 95 mg/dL (ref 0–99)
Triglycerides: 269 mg/dL — ABNORMAL HIGH (ref 0–149)
VLDL Cholesterol Cal: 54 mg/dL — ABNORMAL HIGH (ref 5–40)

## 2013-09-26 LAB — COMPREHENSIVE METABOLIC PANEL
ALT: 10 IU/L (ref 0–32)
AST: 12 IU/L (ref 0–40)
Albumin/Globulin Ratio: 1.7 (ref 1.1–2.5)
Albumin: 4 g/dL (ref 3.2–4.6)
Alkaline Phosphatase: 63 IU/L (ref 39–117)
BUN/Creatinine Ratio: 26 (ref 11–26)
BUN: 27 mg/dL (ref 10–36)
CO2: 22 mmol/L (ref 18–29)
Calcium: 9.2 mg/dL (ref 8.7–10.3)
Chloride: 103 mmol/L (ref 97–108)
Creatinine, Ser: 1.03 mg/dL — ABNORMAL HIGH (ref 0.57–1.00)
GFR calc Af Amer: 55 mL/min/{1.73_m2} — ABNORMAL LOW (ref >59)
GFR calc non Af Amer: 48 mL/min/{1.73_m2} — ABNORMAL LOW (ref >59)
Globulin, Total: 2.3 g/dL (ref 1.5–4.5)
Glucose: 124 mg/dL — ABNORMAL HIGH (ref 65–99)
Potassium: 4.3 mmol/L (ref 3.5–5.2)
Sodium: 140 mmol/L (ref 134–144)
Total Bilirubin: 0.8 mg/dL (ref 0.0–1.2)
Total Protein: 6.3 g/dL (ref 6.0–8.5)

## 2013-09-26 LAB — HEMOGLOBIN A1C
Est. average glucose Bld gHb Est-mCnc: 140 mg/dL
Hgb A1c MFr Bld: 6.5 % — ABNORMAL HIGH (ref 4.8–5.6)

## 2013-09-29 ENCOUNTER — Encounter: Payer: Self-pay | Admitting: Internal Medicine

## 2013-09-29 ENCOUNTER — Ambulatory Visit (INDEPENDENT_AMBULATORY_CARE_PROVIDER_SITE_OTHER): Payer: Medicare Other | Admitting: Internal Medicine

## 2013-09-29 VITALS — BP 142/70 | HR 85 | Temp 98.0°F | Wt 161.8 lb

## 2013-09-29 DIAGNOSIS — I6350 Cerebral infarction due to unspecified occlusion or stenosis of unspecified cerebral artery: Secondary | ICD-10-CM

## 2013-09-29 DIAGNOSIS — Z23 Encounter for immunization: Secondary | ICD-10-CM

## 2013-09-29 DIAGNOSIS — I639 Cerebral infarction, unspecified: Secondary | ICD-10-CM

## 2013-09-29 DIAGNOSIS — N183 Chronic kidney disease, stage 3 unspecified: Secondary | ICD-10-CM

## 2013-09-29 DIAGNOSIS — E782 Mixed hyperlipidemia: Secondary | ICD-10-CM

## 2013-09-29 DIAGNOSIS — E1129 Type 2 diabetes mellitus with other diabetic kidney complication: Secondary | ICD-10-CM

## 2013-09-29 DIAGNOSIS — I129 Hypertensive chronic kidney disease with stage 1 through stage 4 chronic kidney disease, or unspecified chronic kidney disease: Secondary | ICD-10-CM

## 2013-09-29 DIAGNOSIS — E538 Deficiency of other specified B group vitamins: Secondary | ICD-10-CM

## 2013-09-29 MED ORDER — CLOPIDOGREL BISULFATE 75 MG PO TABS: 75.0000 mg | ORAL_TABLET | Freq: Every day | ORAL | Status: DC

## 2013-09-29 MED ORDER — SIMVASTATIN 10 MG PO TABS: 10.0000 mg | ORAL_TABLET | Freq: Every day | ORAL | Status: DC

## 2013-09-29 MED ORDER — CARVEDILOL 12.5 MG PO TABS: 12.5000 mg | ORAL_TABLET | Freq: Two times a day (BID) | ORAL | Status: DC

## 2013-09-29 NOTE — Progress Notes (Signed)
Patient ID: Lisa [name], female   DOB: 06-02-1921, 80 y.o.   MRN: [id number]   Location:  [facility]  Code Status: DNR  Allergies  Allergen Reactions  . Ace Inhibitors Cough    Chief Complaint  Patient presents with  . Follow-up    3 month f/u BP & med refills    HPI: Patient is a 80 y.o. white female seen in the office today for medical mgt of chronic diseases.  Needs her bp meds refilled.  Has not been taking her cholesterol medicine.  Thought it was causing her nasal drainage, but went away only temporarily and came back.  Agrees to go back on it.    BP at goal.    Has appt with ortho about her knees.  Doing synvisc.  Dr. Thomasena Edis at [facility] orthopaedics.  Has been told she has no cartilage left.  appt is 7/30.   No falls.  Mood good now, but had three family members pass in 3 wks.      Saw Dr. Charlesetta Shanks amt macular degeneration in left eye--has 1 year f/u.  Using vitamins.  Easily fatigued.  No bad weakness or falling.  Review of Systems:  ROS  Past Medical History  Diagnosis Date  . Unspecified vitamin D deficiency   . Hyperlipidemia LDL goal < 100   . Osteoarthrosis, unspecified whether generalized or localized, lower leg   . Dermatophytosis of groin and perianal area   . Cervicalgia   . Insomnia, unspecified   . Chronic kidney disease, stage III (moderate)   . Type II or unspecified type diabetes mellitus with renal manifestations, not stated as uncontrolled   . Other abnormal glucose   . Other B-complex deficiencies   . Other malaise and fatigue   . Senile cataract, unspecified   . Unspecified essential hypertension   . Muscle weakness (generalized)   . Tachycardia, unspecified   . Palpitations   . Undiagnosed cardiac murmurs   . Shortness of breath     Past Surgical History  Procedure Laterality Date  . Tonsillectomy  1944  . Tubal ligation  1951  . Toe amputation  2002  . Eye surgery      Social  History:   reports that she has never smoked. She does not have any smokeless tobacco history on file. She reports that she does not drink alcohol or use illicit drugs.  Family History  Problem Relation Age of Onset  . Cerebral aneurysm Mother   . Heart attack Father     Medications: Patient's Medications  New Prescriptions   No medications on file  Previous Medications   CALCIUM-VITAMIN D (OSCAL WITH D) 500-200 MG-UNIT PER TABLET    Take 1 tablet by mouth.   CHOLECALCIFEROL (VITAMIN D) 1000 UNITS TABLET    Take 1,000 Units by mouth daily.   CYANOCOBALAMIN 1000 MCG TABLET    Take 100 mcg by mouth daily.   HYDROCHLOROTHIAZIDE (HYDRODIURIL) 25 MG TABLET    Take 1 tablet (25 mg total) by mouth daily as needed. Swelling   SIMVASTATIN (ZOCOR) 10 MG TABLET    Take 1 tablet (10 mg total) by mouth daily at 6 PM.   TRAMADOL (ULTRAM) 50 MG TABLET    Take 1 tablet (50 mg total) by mouth daily as needed. Take one tablet twice daily as needed for neck pain  Modified Medications   Modified Medication Previous Medication   CARVEDILOL (COREG) 12.5 MG TABLET carvedilol (COREG)  12.5 MG tablet      Take 1 tablet (12.5 mg total) by mouth 2 (two) times daily with a meal. For blood pressure    Take 1 tablet (12.5 mg total) by mouth 2 (two) times daily with a meal. For blood pressure   CLOPIDOGREL (PLAVIX) 75 MG TABLET clopidogrel (PLAVIX) 75 MG tablet      Take 1 tablet (75 mg total) by mouth daily with breakfast.    Take 1 tablet (75 mg total) by mouth daily with breakfast.  Discontinued Medications   MOMETASONE (NASONEX) 50 MCG/ACT NASAL SPRAY    Place 2 sprays into the nose daily.     Physical Exam: Filed Vitals:   09/29/13 1317  BP: 142/70  Pulse: 85  Temp: 98 F (36.7 C)  TempSrc: Oral  Weight: 161 lb 12.8 oz (73.392 kg)  SpO2: 97%  Physical Exam   Labs reviewed: Basic Metabolic Panel:  Recent Labs  16/10/96 1919 06/24/13 0813 09/25/13 0810  NA 144 144 140  K 4.2 4.6 4.3  CL 105  103 103  CO2 26 26 22   GLUCOSE 123* 126* 124*  BUN 25* 24 27  CREATININE 0.99 1.10* 1.03*  CALCIUM 9.2 9.3 9.2   Liver Function Tests:  Recent Labs  09/25/13 0810  AST 12  ALT 10  ALKPHOS 63  BILITOT 0.8  PROT 6.3  CBC:  Recent Labs  04/09/13 1648 04/09/13 1658 05/07/13 1919 06/24/13 0813  WBC 6.3  --  6.7 6.7  NEUTROABS 4.0  --  3.9 4.2  HGB 14.0 14.6 13.1 13.5  HCT 41.6 43.0 38.1 40.7  MCV 88.3  --  87.2 90  PLT 184  --  176  --    Lipid Panel:  Recent Labs  04/10/13 0700 06/24/13 0813 09/25/13 0810  CHOL 195  --   --   HDL 25* 29* 22*  LDLCALC 122* 96 95  TRIG 241* 199* 269*  CHOLHDL 7.8 5.7* 7.8*   Lab Results  Component Value Date   HGBA1C 6.5* 09/25/2013   Assessment/Plan 1. Stroke - has recovered well - uses a cane to walk to keep herself steady due to her prior stroke -advised she needs to go back on statin medicine due to her cholesterol going way up w/o it - carvedilol (COREG) 12.5 MG tablet; Take 1 tablet (12.5 mg total) by mouth 2 (two) times daily with a meal. For blood pressure  Dispense: 180 tablet; Refill: 1 - clopidogrel (PLAVIX) 75 MG tablet; Take 1 tablet (75 mg total) by mouth daily with breakfast.  Dispense: 90 tablet; Refill: 1 - simvastatin (ZOCOR) 10 MG tablet; Take 1 tablet (10 mg total) by mouth daily at 6 PM.  Dispense: 90 tablet; Refill: 3 - Lipid panel; Future - Hemoglobin A1c; Future - Basic metabolic panel; Future  2. Hypertension, renal disease, stage 1-4 or unspecified chronic kidney disease -bp at goal with current meds given her age and fall risk  3. Type II or unspecified type diabetes mellitus with renal manifestations, not stated as uncontrolled - simvastatin (ZOCOR) 10 MG tablet; Take 1 tablet (10 mg total) by mouth daily at 6 PM.  Dispense: 90 tablet; Refill: 3 - Hemoglobin A1c; Future - advised to decrease intake of fried foods to help lower triglycerides and glucose  4. Chronic kidney disease, stage III  (moderate) -kidneys stable during labs  5. B12 deficiency -has been therapeutic with supplement  6. Mixed hyperlipidemia -got worse when she stopped her statin but agrees  to restart it  7. Need for vaccination with 13-polyvalent pneumococcal conjugate vaccine -will give next time (we had run out today)  Labs/tests ordered: Orders Placed This Encounter  Procedures  . Lipid panel    Standing Status: Future     Number of Occurrences:      Standing Expiration Date: 04/01/2014    Order Specific Question:  Has the patient fasted?    Answer:  Yes  . Hemoglobin A1c    Standing Status: Future     Number of Occurrences:      Standing Expiration Date: 04/01/2014  . Basic metabolic panel    Standing Status: Future     Number of Occurrences:      Standing Expiration Date: 04/01/2014    Order Specific Question:  Has the patient fasted?    Answer:  Yes    Next appt:  3 mos for prevnar and f/u on TG, hba1c

## 2013-12-25 ENCOUNTER — Other Ambulatory Visit: Payer: Medicare Other

## 2013-12-29 ENCOUNTER — Ambulatory Visit: Payer: Medicare Other | Admitting: Internal Medicine

## 2014-01-06 ENCOUNTER — Encounter (INDEPENDENT_AMBULATORY_CARE_PROVIDER_SITE_OTHER): Payer: Self-pay

## 2014-01-06 ENCOUNTER — Encounter: Payer: Self-pay | Admitting: Neurology

## 2014-01-06 ENCOUNTER — Ambulatory Visit: Payer: Medicare Other | Admitting: Neurology

## 2014-01-06 ENCOUNTER — Ambulatory Visit (INDEPENDENT_AMBULATORY_CARE_PROVIDER_SITE_OTHER): Payer: Medicare Other | Admitting: Neurology

## 2014-01-06 VITALS — BP 178/77 | HR 71 | Ht 65.0 in | Wt 160.8 lb

## 2014-01-06 DIAGNOSIS — I639 Cerebral infarction, unspecified: Secondary | ICD-10-CM

## 2014-01-06 DIAGNOSIS — E785 Hyperlipidemia, unspecified: Secondary | ICD-10-CM

## 2014-01-06 DIAGNOSIS — I10 Essential (primary) hypertension: Secondary | ICD-10-CM

## 2014-01-06 NOTE — Progress Notes (Signed)
PATIENT: Lisa [name] DOB: 10/04/1921  REASON FOR VISIT: hospital stroke follow up HISTORY FROM: patient  HISTORY OF PRESENT ILLNESS: Lisa [name] is an 80 y.o. female history of hypertension and hyperlipidemia who developed acute weakness involving right arm and leg on 04/09/2013. Patient's voice was a low-volume in speech was slow but not particularly slurred. Facial drooping was noted, however. Patient has no history of stroke nor TIA. She was not been taking antiplatelet therapy on a daily basis. CT scan of her head showed no acute intracranial abnormality. Deficits had improved quickly. NIH stroke score was 0. Patient was not administerd TPA secondary to Beyond time window for TPA consideration arrived in the ER; rapidly resolving deficits. She was admitted for further evaluation and treatment.  MRI of the brain showed a Small, acute infarct in the left corona radiata. Mild chronic small vessel ischemic disease.  MRA of the brain showed No evidence of major intracranial arterial occlusion or flow-limiting stenosis. Mild anterior and posterior circulation branch vessel irregularity and moderate irregular narrowing of the left M1 segment, consistent with intracranial atherosclerosis.   2D Echocardiogram revealed an EF of 60-65% with no source of embolus.  Carotid Doppler showed no evidence of hemodynamically significant internal carotid artery stenosis. Last total cholesterol was 165, HDL 29, LDL 96.  She states her blood pressure had been higher than goal and she is working with Dr. Renato Gailseed on that.  Her BP in the office today is 142/75.  She is tolerating Plavix well with no signs of significant bleeding or bruising.  She has completed outaptient PT and OT.  Interval history During the interval time, she was doing well. She continued on plavix but not able to tolerate simvastatin in the past due to muscle pain and generalized weakness. Her blood pressure was high at 138/77 during clinical  visit, however she said her blood pressure home was OK around 140-150/90. She has a followup with her primary care doctor on Monday next week and will do a blood test tomorrow. Otherwise, she has no complaints.  REVIEW OF SYSTEMS: Full 14 system review of systems performed and notable only for: activity change  ALLERGIES: Allergies  Allergen Reactions  . Ace Inhibitors Cough    HOME MEDICATIONS: Outpatient Prescriptions Prior to Visit  Medication Sig Dispense Refill  . calcium-vitamin D (OSCAL WITH D) 500-200 MG-UNIT per tablet Take 1 tablet by mouth.      . carvedilol (COREG) 12.5 MG tablet Take 1 tablet (12.5 mg total) by mouth 2 (two) times daily with a meal. For blood pressure  180 tablet  1  . cholecalciferol (VITAMIN D) 1000 UNITS tablet Take 1,000 Units by mouth daily.      . clopidogrel (PLAVIX) 75 MG tablet Take 1 tablet (75 mg total) by mouth daily with breakfast.  90 tablet  1  . cyanocobalamin 1000 MCG tablet Take 100 mcg by mouth daily.      . hydrochlorothiazide (HYDRODIURIL) 25 MG tablet Take 1 tablet (25 mg total) by mouth daily as needed. Swelling  30 tablet  3  . traMADol (ULTRAM) 50 MG tablet Take 1 tablet (50 mg total) by mouth daily as needed. Take one tablet twice daily as needed for neck pain  30 tablet  0  . simvastatin (ZOCOR) 10 MG tablet Take 1 tablet (10 mg total) by mouth daily at 6 PM.  90 tablet  3   No facility-administered medications prior to visit.     PHYSICAL EXAM  Filed Vitals:   01/06/14 1350  BP: 178/77  Pulse: 71  Height: 5\' 5"  (1.651 m)  Weight: 160 lb 12.8 oz (72.938 kg)   Body mass index is 26.76 kg/(m^2).  Generalized: Well developed, in no acute distress  Head: normocephalic and atraumatic. Oropharynx benign  Neck: Supple, no carotid bruits  Cardiac: Regular rate rhythm, no murmur  Musculoskeletal: No deformity   Neurological examination  Mentation: Alert oriented to time, place, history taking. Follows all commands speech and  language fluent Cranial nerve II-XII: Fundoscopic exam reveals sharp disc margins.Pupils were equal round reactive to light extraocular movements were full, visual field were full on confrontational test. Facial sensation and strength were normal. hearing was intact to finger rubbing bilaterally. Uvula tongue midline. head turning and shoulder shrug and were normal and symmetric.Tongue protrusion into cheek strength was normal. Motor: The motor testing reveals 5 over 5 strength of all 4 extremities. Good symmetric motor tone is noted throughout. Mild weaker hand grip on the right. Decreased fine motor skills in right hand. Sensory: Sensory testing is intact to pinprick, soft touch, vibration sensation, and position sense on all 4 extremities. No evidence of extinction is noted.  Coordination: Cerebellar testing reveals good finger-nose-finger and heel-to-shin bilaterally.  Gait and station: Gait is normal. Unable to toe, heel or tandem walk. Romberg is negative. No drift is seen.  Reflexes: Deep tendon reflexes are symmetric and normal for age bilaterally. NIHSS: 0 mRS: 1  ASSESSMENT  [name] is a 80 y.o. female who suffered a small left corona radiata infarct on 04/09/13. Infarct etiology felt to be thrombotic due to small vessel disease. Vascular risk factors include advanced age, Hyperlipidemia, Diabetes, and Hypertension. Her blood as you in clinic, no need to check at home closely in the followup with PCP. LDL currently less than 100, will need close monitoring. If more than 100, may consider fibrate.  PLAN - Continue Plavix for stroke prevention - Followup with your PCP for stroke risk factor modification - Check blood pressure home -  Not able to tolerate standing at this moment however LDL was OK, if more than 100 may consider fibrate. - RTC when necessary  Marvel PlanJindong  Starasia Sinko, MD PhD [facility] [address], [address] [place], [place] [address] [phone]  [phone]  Patient Instructions  - continue plavix for stroke prevention - follow up with your PCP for stroke risk factor modification - check BP at home and record and discuss with PCP - not able to tolerate statin, OK with LDL < 100. If high, may consider fibrate.  - follow up as needed.

## 2014-01-06 NOTE — Patient Instructions (Addendum)
-   continue ASA for stroke prevention - follow up with your PCP for stroke risk factor modification - check BP at home and record and discuss with PCP - not able to tolerate statin, OK with LDL < 100. If high, may consider fibrate.  - follow up as needed.

## 2014-01-07 ENCOUNTER — Other Ambulatory Visit: Payer: Medicare Other

## 2014-01-07 DIAGNOSIS — E1129 Type 2 diabetes mellitus with other diabetic kidney complication: Secondary | ICD-10-CM

## 2014-01-07 DIAGNOSIS — I639 Cerebral infarction, unspecified: Secondary | ICD-10-CM

## 2014-01-08 LAB — HEMOGLOBIN A1C
Est. average glucose Bld gHb Est-mCnc: 120 mg/dL
Hgb A1c MFr Bld: 5.8 % — ABNORMAL HIGH (ref 4.8–5.6)

## 2014-01-08 LAB — BASIC METABOLIC PANEL
BUN/Creatinine Ratio: 25 (ref 11–26)
BUN: 23 mg/dL (ref 10–36)
CO2: 24 mmol/L (ref 18–29)
Calcium: 9.1 mg/dL (ref 8.7–10.3)
Chloride: 101 mmol/L (ref 97–108)
Creatinine, Ser: 0.93 mg/dL (ref 0.57–1.00)
GFR calc Af Amer: 63 mL/min/{1.73_m2} (ref >59)
GFR calc non Af Amer: 54 mL/min/{1.73_m2} — ABNORMAL LOW (ref >59)
Glucose: 134 mg/dL — ABNORMAL HIGH (ref 65–99)
Potassium: 4.1 mmol/L (ref 3.5–5.2)
Sodium: 141 mmol/L (ref 134–144)

## 2014-01-08 LAB — LIPID PANEL
Chol/HDL Ratio: 7.5 ratio units — ABNORMAL HIGH (ref 0.0–4.4)
Cholesterol, Total: 181 mg/dL (ref 100–199)
HDL: 24 mg/dL — ABNORMAL LOW (ref >39)
LDL Calculated: 98 mg/dL (ref 0–99)
Triglycerides: 293 mg/dL — ABNORMAL HIGH (ref 0–149)
VLDL Cholesterol Cal: 59 mg/dL — ABNORMAL HIGH (ref 5–40)

## 2014-01-12 ENCOUNTER — Encounter: Payer: Self-pay | Admitting: Internal Medicine

## 2014-01-12 ENCOUNTER — Ambulatory Visit (INDEPENDENT_AMBULATORY_CARE_PROVIDER_SITE_OTHER): Payer: Medicare Other | Admitting: Internal Medicine

## 2014-01-12 VITALS — BP 148/86 | HR 62 | Temp 97.4°F | Resp 12 | Ht 64.0 in | Wt 166.0 lb

## 2014-01-12 DIAGNOSIS — N181 Chronic kidney disease, stage 1: Secondary | ICD-10-CM

## 2014-01-12 DIAGNOSIS — Z23 Encounter for immunization: Secondary | ICD-10-CM

## 2014-01-12 DIAGNOSIS — N189 Chronic kidney disease, unspecified: Secondary | ICD-10-CM

## 2014-01-12 DIAGNOSIS — I129 Hypertensive chronic kidney disease with stage 1 through stage 4 chronic kidney disease, or unspecified chronic kidney disease: Secondary | ICD-10-CM

## 2014-01-12 DIAGNOSIS — I639 Cerebral infarction, unspecified: Secondary | ICD-10-CM

## 2014-01-12 DIAGNOSIS — M170 Bilateral primary osteoarthritis of knee: Secondary | ICD-10-CM

## 2014-01-12 DIAGNOSIS — N183 Chronic kidney disease, stage 3 unspecified: Secondary | ICD-10-CM

## 2014-01-12 DIAGNOSIS — N182 Chronic kidney disease, stage 2 (mild): Secondary | ICD-10-CM

## 2014-01-12 DIAGNOSIS — N184 Chronic kidney disease, stage 4 (severe): Secondary | ICD-10-CM

## 2014-01-12 DIAGNOSIS — N185 Chronic kidney disease, stage 5: Secondary | ICD-10-CM

## 2014-01-12 DIAGNOSIS — E782 Mixed hyperlipidemia: Secondary | ICD-10-CM

## 2014-01-12 MED ORDER — CLOPIDOGREL BISULFATE 75 MG PO TABS: 75.0000 mg | ORAL_TABLET | Freq: Every day | ORAL | Status: DC

## 2014-01-12 MED ORDER — CARVEDILOL 12.5 MG PO TABS: 12.5000 mg | ORAL_TABLET | Freq: Two times a day (BID) | ORAL | Status: DC

## 2014-01-12 NOTE — Patient Instructions (Signed)
Please get your flu shot at the pharmacy.   You got your prevnar today.

## 2014-01-12 NOTE — Progress Notes (Signed)
Patient ID: Lisa [name], female   DOB: 02/13/1922, 80 y.o.   MRN: [id number]   Location:  [facility]  Code Status: DNR  Allergies  Allergen Reactions  . Ace Inhibitors Cough  . Simvastatin Other (See Comments)    Body aches and pains    Chief Complaint  Patient presents with  . Medical Management of Chronic Issues    3 month follow-up, FYI- stopped chlosterol medicaiton x 3 weeks due to muscle aches and pains  . Immunizations    Discuss flu vaccine or pneumonia vaccine     HPI: Patient is a 80 y.o. white female seen in the office today for medical mgt of chronic diseases.  She is doing much better now.  Going to [place] for Christmas.  Got extremely weak when she took her cholesterol medication So she stopped it on her own.  Her orthopedic surgeon wants to know if she can have surgery if she needs it.  Is getting the cartilage injections now for this.    They are helping adequately for now.  Dr. Thomasena Edis is the surgeon.  Is on plavix.  No longer on statin.    Review of Systems:  Review of Systems  Constitutional: Negative for fever and chills.  HENT: Negative for congestion.   Eyes: Negative for blurred vision.  Respiratory: Negative for shortness of breath.   Cardiovascular: Positive for leg swelling. Negative for chest pain and palpitations.       If does not elevate  Gastrointestinal: Negative for abdominal pain.  Genitourinary: Negative for dysuria, urgency and frequency.  Musculoskeletal: Positive for joint pain.       Bilateral knees  Skin: Negative for rash.  Neurological: Negative for dizziness.  Endo/Heme/Allergies: Bruises/bleeds easily.  Psychiatric/Behavioral: Negative for depression and memory loss.     Past Medical History  Diagnosis Date  . Unspecified vitamin D deficiency   . Hyperlipidemia LDL goal < 100   . Osteoarthrosis, unspecified whether generalized or localized, lower leg   . Dermatophytosis of  groin and perianal area   . Cervicalgia   . Insomnia, unspecified   . Chronic kidney disease, stage III (moderate)   . Type II or unspecified type diabetes mellitus with renal manifestations, not stated as uncontrolled   . Other abnormal glucose   . Other B-complex deficiencies   . Other malaise and fatigue   . Senile cataract, unspecified   . Unspecified essential hypertension   . Muscle weakness (generalized)   . Tachycardia, unspecified   . Palpitations   . Undiagnosed cardiac murmurs   . Shortness of breath     Past Surgical History  Procedure Laterality Date  . Tonsillectomy  1944  . Tubal ligation  1951  . Toe amputation  2002  . Eye surgery      Social History:   reports that she has never smoked. She has never used smokeless tobacco. She reports that she does not drink alcohol or use illicit drugs.  Family History  Problem Relation Age of Onset  . Cerebral aneurysm Mother   . Heart attack Father     Medications: Patient's Medications  New Prescriptions   No medications on file  Previous Medications   CALCIUM-VITAMIN D (OSCAL WITH D) 500-200 MG-UNIT PER TABLET    Take 1 tablet by mouth.   CARVEDILOL (COREG) 12.5 MG TABLET    Take 1 tablet (12.5 mg total) by mouth 2 (two) times daily with a meal. For  blood pressure   CHOLECALCIFEROL (VITAMIN D) 1000 UNITS TABLET    Take 1,000 Units by mouth daily.   CLOPIDOGREL (PLAVIX) 75 MG TABLET    Take 1 tablet (75 mg total) by mouth daily with breakfast.   CYANOCOBALAMIN 1000 MCG TABLET    Take 100 mcg by mouth daily.   HYDROCHLOROTHIAZIDE (HYDRODIURIL) 25 MG TABLET    Take 1 tablet (25 mg total) by mouth daily as needed. Swelling   TRAMADOL (ULTRAM) 50 MG TABLET    Take 1 tablet (50 mg total) by mouth daily as needed. Take one tablet twice daily as needed for neck pain  Modified Medications   No medications on file  Discontinued Medications   No medications on file     Physical Exam: Filed Vitals:   01/12/14 1037    BP: 148/86  Pulse: 62  Temp: 97.4 F (36.3 C)  TempSrc: Oral  Resp: 12  Height: 5\' 4"  (1.626 m)  Weight: 166 lb (75.297 kg)  SpO2: 99%  Physical Exam  Constitutional: She is oriented to person, place, and time. She appears well-developed and well-nourished. No distress.  Cardiovascular: Normal rate, regular rhythm, normal heart sounds and intact distal pulses.   Pulmonary/Chest: Effort normal and breath sounds normal. No respiratory distress.  Abdominal: Soft. Bowel sounds are normal. She exhibits no distension and no mass. There is no tenderness.  Musculoskeletal: Normal range of motion. She exhibits no edema and no tenderness.  Walking independently w/o assistive device  Neurological: She is alert and oriented to person, place, and time.  Skin: Skin is warm and dry.  Psychiatric: She has a normal mood and affect.    Labs reviewed: Basic Metabolic Panel:  Recent Labs  16/12/9602/07/15 0813 09/25/13 0810 01/07/14 0830  NA 144 140 141  K 4.6 4.3 4.1  CL 103 103 101  CO2 26 22 24   GLUCOSE 126* 124* 134*  BUN 24 27 23   CREATININE 1.10* 1.03* 0.93  CALCIUM 9.3 9.2 9.1   Liver Function Tests:  Recent Labs  09/25/13 0810  AST 12  ALT 10  ALKPHOS 63  BILITOT 0.8  PROT 6.3   No results found for this basename: LIPASE, AMYLASE,  in the last 8760 hours No results found for this basename: AMMONIA,  in the last 8760 hours CBC:  Recent Labs  04/09/13 1648 04/09/13 1658 05/07/13 1919 06/24/13 0813  WBC 6.3  --  6.7 6.7  NEUTROABS 4.0  --  3.9 4.2  HGB 14.0 14.6 13.1 13.5  HCT 41.6 43.0 38.1 40.7  MCV 88.3  --  87.2 90  PLT 184  --  176  --    Lipid Panel:  Recent Labs  04/10/13 0700 06/24/13 0813 09/25/13 0810 01/07/14 0830  CHOL 195  --   --   --   HDL 25* 29* 22* 24*  LDLCALC 122* 96 95 98  TRIG 241* 199* 269* 293*  CHOLHDL 7.8 5.7* 7.8* 7.5*   Lab Results  Component Value Date   HGBA1C 5.8* 01/07/2014   Assessment/Plan 1. Stroke -cont secondary  prevention -cannot tolerate statin medications -if LDL comes up over 100, will start on fibrate and see if tolerates that (also was recommendation of her neurologist) - clopidogrel (PLAVIX) 75 MG tablet; Take 1 tablet (75 mg total) by mouth daily with breakfast.  Dispense: 90 tablet; Refill: 1 - carvedilol (COREG) 12.5 MG tablet; Take 1 tablet (12.5 mg total) by mouth 2 (two) times daily with a meal. For blood  pressure  Dispense: 180 tablet; Refill: 1  2. Primary osteoarthritis of both knees -is considering TKA for these one at a time -she is very functional and has recovered well from her stroke -explained that her biggest risk will be stopping her plavix for a week due to her prior stroke--she understands this -she has a cardiologist and I suggest she also get clearance from him at this point should she decide to go through with a surgery -doing well with cartilage injections at this point  3. Hypertension, renal disease, stage 1-4 or unspecified chronic kidney disease -at goal with current regimen  4. Chronic kidney disease, stage III (moderate) -avoid nsaids, cont current therapy, avoid other nephrotoxic agents  5. Mixed hyperlipidemia -did not tolerate statin therapy, will start fibrate if LDL over 100 when reassess in 3 mos -suspect she will not tolerate that either  6. Need for vaccination with 13-polyvalent pneumococcal conjugate vaccine -given prevnar today  7. Need for prophylactic vaccination and inoculation against influenza -going to get at pharmacy when picks up meds b/c didn't want two vaccines same day  Labs/tests ordered:  Bmp, hba1c, flp before Next appt:  3 mos  Devetta Hagenow L. Zhania Shaheen, D.O. Geriatrics [facility] [address] [place], [place] [address] Cell Phone (Mon-Fri 8am-5pm):  [phone] On Call:  [phone] & follow prompts after 5pm & weekends Office Phone:  [phone] Office Fax:  [phone]

## 2014-01-28 ENCOUNTER — Ambulatory Visit (INDEPENDENT_AMBULATORY_CARE_PROVIDER_SITE_OTHER): Payer: Medicare Other | Admitting: Internal Medicine

## 2014-01-28 ENCOUNTER — Encounter: Payer: Self-pay | Admitting: Internal Medicine

## 2014-01-28 VITALS — BP 126/70 | HR 59 | Temp 98.3°F | Resp 10 | Wt 164.0 lb

## 2014-01-28 DIAGNOSIS — L853 Xerosis cutis: Secondary | ICD-10-CM

## 2014-01-28 NOTE — Progress Notes (Signed)
Patient ID: Lisa [name], female   DOB: 08/17/1921, 80 y.o.   MRN: [id number]    Chief Complaint  Patient presents with  . Acute Visit    Examine red spot on upper thigh- getting bigger. First noticed last Friday    Allergies  Allergen Reactions  . Ace Inhibitors Cough  . Simvastatin Other (See Comments)    Body aches and pains   HPI 80 y/o female pt here for acute concern. She noticed some redness on her thigh area 5 days back. She noticed it while cleaning her self and it is in both thigh, has increased in size. Denies itching or pain. Denies any swelling, skin breakdown or drainage/ discharge.  ROS Denies fever or chills Denies rash elsewhere Denies new agent/ detergent/ chemical use Denies any palpable lumps in thigh and groin area Denies vaginal discharge  Past Medical History  Diagnosis Date  . Unspecified vitamin D deficiency   . Hyperlipidemia LDL goal < 100   . Osteoarthrosis, unspecified whether generalized or localized, lower leg   . Dermatophytosis of groin and perianal area   . Cervicalgia   . Insomnia, unspecified   . Chronic kidney disease, stage III (moderate)   . Type II or unspecified type diabetes mellitus with renal manifestations, not stated as uncontrolled   . Other abnormal glucose   . Other B-complex deficiencies   . Other malaise and fatigue   . Senile cataract, unspecified   . Unspecified essential hypertension   . Muscle weakness (generalized)   . Tachycardia, unspecified   . Palpitations   . Undiagnosed cardiac murmurs   . Shortness of breath    [facility]  Physical exam BP 126/70 mmHg  Pulse 59  Temp(Src) 98.3 F (36.8 C) (Oral)  Resp 10  Wt 164 lb (74.39 kg)  SpO2 97%  General- elderly female in no acute distress Head- atraumatic, normocephalic Eyes- no pallor, no icterus, no discharge Neck- no lymphadenopathy Mouth- normal mucus membrane Cardiovascular- normal s1,s2, no murmurs Respiratory- bilateral  clear to auscultation, no wheeze, no rhonchi, no crackles Musculoskeletal- able to move all 4 extremities Neurological- no focal deficit Skin- warm and dry, erythema in groin area present extending some to upper thigh, symmetrical on both sides, no excess moisture, dry skin, no skin breakdown, blanchable Psychiatry- alert and oriented to person, place and time, normal mood and affect  Assessment/plan  1. Dry skin The redness in her thigh/ groin area appears to be from dry skin. No signs of fungal infection at present. Advised pt to apply moisturizer generously for now and reassess if no improvement.

## 2014-04-14 ENCOUNTER — Other Ambulatory Visit: Payer: Medicare Other

## 2014-04-14 DIAGNOSIS — I639 Cerebral infarction, unspecified: Secondary | ICD-10-CM

## 2014-04-14 DIAGNOSIS — I6350 Cerebral infarction due to unspecified occlusion or stenosis of unspecified cerebral artery: Secondary | ICD-10-CM | POA: Diagnosis not present

## 2014-04-14 DIAGNOSIS — E782 Mixed hyperlipidemia: Secondary | ICD-10-CM | POA: Diagnosis not present

## 2014-04-15 LAB — LIPID PANEL
Chol/HDL Ratio: 7.2 ratio units — ABNORMAL HIGH (ref 0.0–4.4)
Cholesterol, Total: 181 mg/dL (ref 100–199)
HDL: 25 mg/dL — ABNORMAL LOW (ref >39)
LDL Calculated: 99 mg/dL (ref 0–99)
Triglycerides: 286 mg/dL — ABNORMAL HIGH (ref 0–149)
VLDL Cholesterol Cal: 57 mg/dL — ABNORMAL HIGH (ref 5–40)

## 2014-04-15 LAB — HEMOGLOBIN A1C
Est. average glucose Bld gHb Est-mCnc: 126 mg/dL
Hgb A1c MFr Bld: 6 % — ABNORMAL HIGH (ref 4.8–5.6)

## 2014-04-15 LAB — CBC WITH DIFFERENTIAL
Basophils Absolute: 0 10*3/uL (ref 0.0–0.2)
Basos: 1 %
Eos: 4 %
Eosinophils Absolute: 0.2 10*3/uL (ref 0.0–0.4)
HCT: 42 % (ref 34.0–46.6)
Hemoglobin: 14 g/dL (ref 11.1–15.9)
Immature Grans (Abs): 0 10*3/uL (ref 0.0–0.1)
Immature Granulocytes: 0 %
Lymphocytes Absolute: 1.4 10*3/uL (ref 0.7–3.1)
Lymphs: 23 %
MCH: 29.2 pg (ref 26.6–33.0)
MCHC: 33.3 g/dL (ref 31.5–35.7)
MCV: 88 fL (ref 79–97)
Monocytes Absolute: 0.3 10*3/uL (ref 0.1–0.9)
Monocytes: 5 %
Neutrophils Absolute: 4 10*3/uL (ref 1.4–7.0)
Neutrophils Relative %: 67 %
RBC: 4.8 x10E6/uL (ref 3.77–5.28)
RDW: 14.1 % (ref 12.3–15.4)
WBC: 6 10*3/uL (ref 3.4–10.8)

## 2014-04-16 ENCOUNTER — Ambulatory Visit (INDEPENDENT_AMBULATORY_CARE_PROVIDER_SITE_OTHER): Payer: Medicare Other | Admitting: Internal Medicine

## 2014-04-16 ENCOUNTER — Encounter: Payer: Self-pay | Admitting: Internal Medicine

## 2014-04-16 VITALS — BP 128/74 | HR 65 | Temp 96.5°F | Ht 64.0 in | Wt 158.0 lb

## 2014-04-16 DIAGNOSIS — M1710 Unilateral primary osteoarthritis, unspecified knee: Secondary | ICD-10-CM

## 2014-04-16 DIAGNOSIS — I639 Cerebral infarction, unspecified: Secondary | ICD-10-CM

## 2014-04-16 DIAGNOSIS — M542 Cervicalgia: Secondary | ICD-10-CM | POA: Diagnosis not present

## 2014-04-16 DIAGNOSIS — M179 Osteoarthritis of knee, unspecified: Secondary | ICD-10-CM

## 2014-04-16 DIAGNOSIS — E782 Mixed hyperlipidemia: Secondary | ICD-10-CM

## 2014-04-16 DIAGNOSIS — IMO0002 Reserved for concepts with insufficient information to code with codable children: Secondary | ICD-10-CM

## 2014-04-16 DIAGNOSIS — I10 Essential (primary) hypertension: Secondary | ICD-10-CM

## 2014-04-16 DIAGNOSIS — N183 Chronic kidney disease, stage 3 unspecified: Secondary | ICD-10-CM

## 2014-04-16 MED ORDER — TRAMADOL HCL 50 MG PO TABS: ORAL_TABLET | ORAL | Status: DC

## 2014-04-16 MED ORDER — CLOPIDOGREL BISULFATE 75 MG PO TABS: 75.0000 mg | ORAL_TABLET | Freq: Every day | ORAL | Status: DC

## 2014-04-16 MED ORDER — CARVEDILOL 12.5 MG PO TABS: 12.5000 mg | ORAL_TABLET | Freq: Two times a day (BID) | ORAL | Status: DC

## 2014-04-16 NOTE — Progress Notes (Signed)
Patient ID: Lisa [name], female   DOB: 1921/07/12, 81 y.o.   MRN: [id number]   Location:  [facility]   Allergies  Allergen Reactions  . Ace Inhibitors Cough  . Simvastatin Other (See Comments)    Body aches and pains    Chief Complaint  Patient presents with  . Medical Management of Chronic Issues    3 month follow-up, discuss labs (copy printed)     HPI: Patient is a 81 y.o.  seen in the office today for med mgt of chronic diseases.  She is doing very well.  She has some difficulty with staggering when walking but this has been ongoing since the stroke and she has not fallen.  BP at goal.    Cholesterol high, but she did not tolerate statins and also does not want to take any alternatives at her age.    Arthritis is stable.    Diabetes is mild.  Eats well and does not really watch diet at her age.    She is going to [place] to visit her family.    Review of Systems:  Review of Systems  Constitutional: Negative for fever.  HENT: Negative for congestion.   Respiratory: Negative for shortness of breath.   Cardiovascular: Negative for chest pain and leg swelling.  Gastrointestinal: Negative for abdominal pain, constipation, blood in stool and melena.  Genitourinary: Negative for dysuria.  Musculoskeletal: Negative for myalgias and falls.       Staggering medial legs, mild weakness of right leg  Skin: Negative for rash.  Neurological: Negative for dizziness.  Endo/Heme/Allergies: Does not bruise/bleed easily.  Psychiatric/Behavioral: Negative for depression and memory loss.     Past Medical History  Diagnosis Date  . Unspecified vitamin D deficiency   . Hyperlipidemia LDL goal < 100   . Osteoarthrosis, unspecified whether generalized or localized, lower leg   . Dermatophytosis of groin and perianal area   . Cervicalgia   . Insomnia, unspecified   . Chronic kidney disease, stage III (moderate)   . Type II or  unspecified type diabetes mellitus with renal manifestations, not stated as uncontrolled   . Other abnormal glucose   . Other B-complex deficiencies   . Other malaise and fatigue   . Senile cataract, unspecified   . Unspecified essential hypertension   . Muscle weakness (generalized)   . Tachycardia, unspecified   . Palpitations   . Undiagnosed cardiac murmurs   . Shortness of breath     Past Surgical History  Procedure Laterality Date  . Tonsillectomy  1944  . Tubal ligation  1951  . Toe amputation  2002  . Eye surgery      Social History:   reports that she has never smoked. She has never used smokeless tobacco. She reports that she does not drink alcohol or use illicit drugs.  Family History  Problem Relation Age of Onset  . Cerebral aneurysm Mother   . Heart attack Father     Medications: Patient's Medications  New Prescriptions   No medications on file  Previous Medications   CALCIUM-VITAMIN D (OSCAL WITH D) 500-200 MG-UNIT PER TABLET    Take 1 tablet by mouth.   CARVEDILOL (COREG) 12.5 MG TABLET    Take 1 tablet (12.5 mg total) by mouth 2 (two) times daily with a meal. For blood pressure   CHOLECALCIFEROL (VITAMIN D) 1000 UNITS TABLET    Take 1,000 Units by mouth daily.   CLOPIDOGREL (  PLAVIX) 75 MG TABLET    Take 1 tablet (75 mg total) by mouth daily with breakfast.   CYANOCOBALAMIN 1000 MCG TABLET    Take 100 mcg by mouth daily.   HYDROCHLOROTHIAZIDE (HYDRODIURIL) 25 MG TABLET    Take 1 tablet (25 mg total) by mouth daily as needed. Swelling   TRAMADOL (ULTRAM) 50 MG TABLET    Take 1 tablet (50 mg total) by mouth daily as needed. Take one tablet twice daily as needed for neck pain  Modified Medications   No medications on file  Discontinued Medications   No medications on file     Physical Exam: Filed Vitals:   04/16/14 1029  BP: 128/74  Pulse: 65  Temp: 96.5 F (35.8 C)  TempSrc: Oral  Height: 5\' 4"  (1.626 m)  Weight: 158 lb (71.668 kg)  SpO2: 99%    Physical Exam  Constitutional: She is oriented to person, place, and time. She appears well-developed and well-nourished. No distress.  Cardiovascular: Normal rate, regular rhythm, normal heart sounds and intact distal pulses.   Pulmonary/Chest: Effort normal and breath sounds normal. No respiratory distress.  Abdominal: Soft. Bowel sounds are normal. She exhibits no distension and no mass. There is no tenderness.  Musculoskeletal: Normal range of motion. She exhibits no edema or tenderness.  Neurological: She is alert and oriented to person, place, and time.  Skin: Skin is warm and dry.  Psychiatric: She has a normal mood and affect.    Labs reviewed: Basic Metabolic Panel:  Recent Labs  40/98/1102/10/02 0813 09/25/13 0810 01/07/14 0830  NA 144 140 141  K 4.6 4.3 4.1  CL 103 103 101  CO2 26 22 24   GLUCOSE 126* 124* 134*  BUN 24 27 23   CREATININE 1.10* 1.03* 0.93  CALCIUM 9.3 9.2 9.1   Liver Function Tests:  Recent Labs  09/25/13 0810  AST 12  ALT 10  ALKPHOS 63  BILITOT 0.8  PROT 6.3   No results for input(s): LIPASE, AMYLASE in the last 8760 hours. No results for input(s): AMMONIA in the last 8760 hours. CBC:  Recent Labs  05/07/13 1919 06/24/13 0813 04/14/14 0817  WBC 6.7 6.7 6.0  NEUTROABS 3.9 4.2 4.0  HGB 13.1 13.5 14.0  HCT 38.1 40.7 42.0  MCV 87.2 90 88  PLT 176  --   --    Lipid Panel:  Recent Labs  09/25/13 0810 01/07/14 0830 04/14/14 0817  HDL 22* 24* 25*  LDLCALC 95 98 99  TRIG 269* 293* 286*  CHOLHDL 7.8* 7.5* 7.2*   Lab Results  Component Value Date   HGBA1C 6.0* 04/14/2014    Assessment/Plan 1. Stroke -with right leg weakness, has unsteady gait, sometimes uses cane but has no assistive device with her today, no falls - clopidogrel (PLAVIX) 75 MG tablet; Take 1 tablet (75 mg total) by mouth daily with breakfast.  Dispense: 90 tablet; Refill: 1 - carvedilol (COREG) 12.5 MG tablet; Take 1 tablet (12.5 mg total) by mouth 2 (two) times  daily with a meal. For blood pressure  Dispense: 180 tablet; Refill: 1 -cont secondary prevention with bp control and plavix  2. Osteoarthrosis, unspecified whether generalized or localized, involving lower leg -cont voltaren and tylenol and tramadol for severe pain  3. Cervicalgia -renew tramadol - traMADol (ULTRAM) 50 MG tablet; Take one tablet twice daily as needed for neck pain  Dispense: 60 tablet; Refill: 2  4. Chronic kidney disease, stage III (moderate) - avoid nsaids orally, cont adequate hydration -  CBC with Differential/Platelet; Future - Comprehensive metabolic panel; Future  5. Mixed hyperlipidemia - knows what foods to avoid, but does not desire to do this at this point -did not tolerate statins and refuses alternatives - Comprehensive metabolic panel; Future - Hemoglobin A1c; Future  6. Essential hypertension, benign -bp at goal with current medications  Labs/tests ordered:   Orders Placed This Encounter  Procedures  . CBC with Differential/Platelet    Standing Status: Future     Number of Occurrences:      Standing Expiration Date: 10/15/2014  . Comprehensive metabolic panel    Standing Status: Future     Number of Occurrences:      Standing Expiration Date: 10/15/2014  . Hemoglobin A1c    Standing Status: Future     Number of Occurrences:      Standing Expiration Date: 10/15/2014    Next appt:  3 mos with labs before  Iris Tatsch L. Laketa Sandoz, D.O. Geriatrics [facility] [address] [place], [place] [address] Cell Phone (Mon-Fri 8am-5pm):  [phone] On Call:  [phone] & follow prompts after 5pm & weekends Office Phone:  [phone] Office Fax:  [phone]

## 2014-07-14 ENCOUNTER — Other Ambulatory Visit: Payer: Medicare Other

## 2014-07-14 DIAGNOSIS — N183 Chronic kidney disease, stage 3 unspecified: Secondary | ICD-10-CM

## 2014-07-14 DIAGNOSIS — E782 Mixed hyperlipidemia: Secondary | ICD-10-CM | POA: Diagnosis not present

## 2014-07-15 LAB — COMPREHENSIVE METABOLIC PANEL WITH GFR
ALT: 12 [IU]/L (ref 0–32)
AST: 13 [IU]/L (ref 0–40)
Albumin/Globulin Ratio: 1.9 (ref 1.1–2.5)
Albumin: 4.1 g/dL (ref 3.2–4.6)
Alkaline Phosphatase: 78 [IU]/L (ref 39–117)
BUN/Creatinine Ratio: 19 (ref 11–26)
BUN: 19 mg/dL (ref 10–36)
Bilirubin Total: 0.6 mg/dL (ref 0.0–1.2)
CO2: 23 mmol/L (ref 18–29)
Calcium: 8.9 mg/dL (ref 8.7–10.3)
Chloride: 105 mmol/L (ref 97–108)
Creatinine, Ser: 1 mg/dL (ref 0.57–1.00)
GFR calc Af Amer: 57 mL/min/{1.73_m2} — ABNORMAL LOW
GFR calc non Af Amer: 50 mL/min/{1.73_m2} — ABNORMAL LOW
Globulin, Total: 2.2 g/dL (ref 1.5–4.5)
Glucose: 124 mg/dL — ABNORMAL HIGH (ref 65–99)
Potassium: 4.4 mmol/L (ref 3.5–5.2)
Sodium: 144 mmol/L (ref 134–144)
Total Protein: 6.3 g/dL (ref 6.0–8.5)

## 2014-07-15 LAB — CBC WITH DIFFERENTIAL/PLATELET
Basophils Absolute: 0 10*3/uL (ref 0.0–0.2)
Basos: 1 %
EOS (ABSOLUTE): 0.2 10*3/uL (ref 0.0–0.4)
Eos: 4 %
Hematocrit: 40.4 % (ref 34.0–46.6)
Hemoglobin: 13.3 g/dL (ref 11.1–15.9)
Immature Grans (Abs): 0 10*3/uL (ref 0.0–0.1)
Immature Granulocytes: 0 %
Lymphocytes Absolute: 1.4 10*3/uL (ref 0.7–3.1)
Lymphs: 24 %
MCH: 29.2 pg (ref 26.6–33.0)
MCHC: 32.9 g/dL (ref 31.5–35.7)
MCV: 89 fL (ref 79–97)
Monocytes Absolute: 0.3 10*3/uL (ref 0.1–0.9)
Monocytes: 5 %
Neutrophils Absolute: 3.7 10*3/uL (ref 1.4–7.0)
Neutrophils: 66 %
Platelets: 193 10*3/uL (ref 150–379)
RBC: 4.55 x10E6/uL (ref 3.77–5.28)
RDW: 14.3 % (ref 12.3–15.4)
WBC: 5.6 10*3/uL (ref 3.4–10.8)

## 2014-07-15 LAB — HEMOGLOBIN A1C
Est. average glucose Bld gHb Est-mCnc: 126 mg/dL
Hgb A1c MFr Bld: 6 % — ABNORMAL HIGH (ref 4.8–5.6)

## 2014-07-16 ENCOUNTER — Encounter: Payer: Self-pay | Admitting: Internal Medicine

## 2014-07-16 ENCOUNTER — Ambulatory Visit (INDEPENDENT_AMBULATORY_CARE_PROVIDER_SITE_OTHER): Payer: Medicare Other | Admitting: Internal Medicine

## 2014-07-16 VITALS — BP 126/84 | HR 62 | Temp 98.2°F | Ht 64.0 in | Wt 159.0 lb

## 2014-07-16 DIAGNOSIS — N183 Chronic kidney disease, stage 3 unspecified: Secondary | ICD-10-CM

## 2014-07-16 DIAGNOSIS — M170 Bilateral primary osteoarthritis of knee: Secondary | ICD-10-CM | POA: Diagnosis not present

## 2014-07-16 DIAGNOSIS — I639 Cerebral infarction, unspecified: Secondary | ICD-10-CM

## 2014-07-16 DIAGNOSIS — G4700 Insomnia, unspecified: Secondary | ICD-10-CM | POA: Diagnosis not present

## 2014-07-16 DIAGNOSIS — E782 Mixed hyperlipidemia: Secondary | ICD-10-CM | POA: Diagnosis not present

## 2014-07-16 DIAGNOSIS — I10 Essential (primary) hypertension: Secondary | ICD-10-CM

## 2014-07-16 MED ORDER — CLOPIDOGREL BISULFATE 75 MG PO TABS: 75.0000 mg | ORAL_TABLET | Freq: Every day | ORAL | Status: DC

## 2014-07-16 MED ORDER — CARVEDILOL 12.5 MG PO TABS: 12.5000 mg | ORAL_TABLET | Freq: Two times a day (BID) | ORAL | Status: DC
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
## 2014-07-24 DIAGNOSIS — H3531 Nonexudative age-related macular degeneration: Secondary | ICD-10-CM | POA: Diagnosis not present

## 2014-07-24 DIAGNOSIS — H34831 Tributary (branch) retinal vein occlusion, right eye: Secondary | ICD-10-CM | POA: Diagnosis not present

## 2014-07-24 DIAGNOSIS — Z961 Presence of intraocular lens: Secondary | ICD-10-CM | POA: Diagnosis not present

## 2014-07-24 DIAGNOSIS — H04123 Dry eye syndrome of bilateral lacrimal glands: Secondary | ICD-10-CM | POA: Diagnosis not present

## 2014-07-27 DIAGNOSIS — H34831 Tributary (branch) retinal vein occlusion, right eye: Secondary | ICD-10-CM | POA: Diagnosis not present

## 2014-07-27 DIAGNOSIS — H35033 Hypertensive retinopathy, bilateral: Secondary | ICD-10-CM | POA: Diagnosis not present

## 2014-08-19 DIAGNOSIS — M1712 Unilateral primary osteoarthritis, left knee: Secondary | ICD-10-CM | POA: Diagnosis not present

## 2014-08-19 DIAGNOSIS — M1711 Unilateral primary osteoarthritis, right knee: Secondary | ICD-10-CM | POA: Diagnosis not present

## 2014-08-19 DIAGNOSIS — M170 Bilateral primary osteoarthritis of knee: Secondary | ICD-10-CM | POA: Diagnosis not present

## 2014-08-26 DIAGNOSIS — M170 Bilateral primary osteoarthritis of knee: Secondary | ICD-10-CM | POA: Diagnosis not present

## 2014-08-31 DIAGNOSIS — H34831 Tributary (branch) retinal vein occlusion, right eye: Secondary | ICD-10-CM | POA: Diagnosis not present

## 2014-09-02 DIAGNOSIS — M1712 Unilateral primary osteoarthritis, left knee: Secondary | ICD-10-CM | POA: Diagnosis not present

## 2014-09-02 DIAGNOSIS — M1711 Unilateral primary osteoarthritis, right knee: Secondary | ICD-10-CM | POA: Diagnosis not present

## 2014-09-09 DIAGNOSIS — M1711 Unilateral primary osteoarthritis, right knee: Secondary | ICD-10-CM | POA: Diagnosis not present

## 2014-09-09 DIAGNOSIS — M1712 Unilateral primary osteoarthritis, left knee: Secondary | ICD-10-CM | POA: Diagnosis not present

## 2014-09-16 DIAGNOSIS — M170 Bilateral primary osteoarthritis of knee: Secondary | ICD-10-CM | POA: Diagnosis not present

## 2014-09-16 DIAGNOSIS — M1711 Unilateral primary osteoarthritis, right knee: Secondary | ICD-10-CM | POA: Diagnosis not present

## 2014-09-16 DIAGNOSIS — M1712 Unilateral primary osteoarthritis, left knee: Secondary | ICD-10-CM | POA: Diagnosis not present

## 2014-11-02 DIAGNOSIS — H35351 Cystoid macular degeneration, right eye: Secondary | ICD-10-CM | POA: Diagnosis not present

## 2014-11-02 DIAGNOSIS — M1712 Unilateral primary osteoarthritis, left knee: Secondary | ICD-10-CM | POA: Diagnosis not present

## 2014-11-02 DIAGNOSIS — H34831 Tributary (branch) retinal vein occlusion, right eye: Secondary | ICD-10-CM | POA: Diagnosis not present

## 2014-11-02 DIAGNOSIS — M170 Bilateral primary osteoarthritis of knee: Secondary | ICD-10-CM | POA: Diagnosis not present

## 2014-11-02 DIAGNOSIS — M1711 Unilateral primary osteoarthritis, right knee: Secondary | ICD-10-CM | POA: Diagnosis not present

## 2014-11-06 ENCOUNTER — Ambulatory Visit: Payer: Medicare Other | Admitting: Internal Medicine

## 2014-11-16 ENCOUNTER — Encounter: Payer: Self-pay | Admitting: Internal Medicine

## 2014-11-16 ENCOUNTER — Ambulatory Visit (INDEPENDENT_AMBULATORY_CARE_PROVIDER_SITE_OTHER): Payer: Medicare Other | Admitting: Internal Medicine

## 2014-11-16 VITALS — BP 132/80 | HR 64 | Temp 97.7°F | Resp 16 | Ht 65.0 in | Wt 161.0 lb

## 2014-11-16 DIAGNOSIS — M170 Bilateral primary osteoarthritis of knee: Secondary | ICD-10-CM | POA: Diagnosis not present

## 2014-11-16 DIAGNOSIS — Z8673 Personal history of transient ischemic attack (TIA), and cerebral infarction without residual deficits: Secondary | ICD-10-CM

## 2014-11-16 DIAGNOSIS — N183 Chronic kidney disease, stage 3 unspecified: Secondary | ICD-10-CM

## 2014-11-16 DIAGNOSIS — E785 Hyperlipidemia, unspecified: Secondary | ICD-10-CM | POA: Diagnosis not present

## 2014-11-16 DIAGNOSIS — Z01818 Encounter for other preprocedural examination: Secondary | ICD-10-CM

## 2014-11-16 DIAGNOSIS — E1129 Type 2 diabetes mellitus with other diabetic kidney complication: Secondary | ICD-10-CM | POA: Diagnosis not present

## 2014-11-16 DIAGNOSIS — I129 Hypertensive chronic kidney disease with stage 1 through stage 4 chronic kidney disease, or unspecified chronic kidney disease: Secondary | ICD-10-CM | POA: Diagnosis not present

## 2014-11-16 DIAGNOSIS — R29898 Other symptoms and signs involving the musculoskeletal system: Secondary | ICD-10-CM

## 2014-11-16 DIAGNOSIS — N058 Unspecified nephritic syndrome with other morphologic changes: Secondary | ICD-10-CM | POA: Diagnosis not present

## 2014-11-16 DIAGNOSIS — G4700 Insomnia, unspecified: Secondary | ICD-10-CM | POA: Diagnosis not present

## 2014-11-16 DIAGNOSIS — I639 Cerebral infarction, unspecified: Secondary | ICD-10-CM

## 2014-11-16 MED ORDER — CLOPIDOGREL BISULFATE 75 MG PO TABS: 75.0000 mg | ORAL_TABLET | Freq: Every day | ORAL | Status: DC

## 2014-11-16 MED ORDER — CARVEDILOL 12.5 MG PO TABS: 12.5000 mg | ORAL_TABLET | Freq: Two times a day (BID) | ORAL | Status: DC

## 2014-11-16 MED ORDER — HYDROCHLOROTHIAZIDE 25 MG PO TABS: 25.0000 mg | ORAL_TABLET | Freq: Every day | ORAL | Status: DC | PRN

## 2014-11-16 NOTE — Progress Notes (Signed)
Patient ID: Lisa [name], female   DOB: [birth year], 81 y.o.   MRN: [id number]   Location:  [facility]  Code Status: DNR Goals of Care: Advanced Directive information Does patient have an advance directive?: Yes, Type of Advance Directive: Living will (DNR), Does patient want to make changes to advanced directive?: No - Patient declined   Chief Complaint  Patient presents with  . Medical Clearance    Surgical Clearance for Left Knee TKA-Medial   . Medical Management of Chronic Issues    4 month follow-up on DM, HTN, HLD, and Kidney Disease.  DM foot exam due     HPI: Patient is a 81 y.o. white female seen in the office today for surgical clearance for her left total knee arthroplasty by Dr. Thomasena Edis at [facility].  Had occlusion in right eye found by Dr. Dione Booze.  Had some blurry vision and she thought it was from the stroke she had before.   Had two shots and went to Dr. Jamse Mead two shots, had inflammation.  Has now had a second type of more aggressive shot.    Both knees are severely painful, unsteady.  Not completely giving out yet.  Is being very careful and using a cane for support.  Is for left TKA in Nov by Dr. Thomasena Edis.    Her brother passed away so she had to reschedule her appt.   No chest pains.  No changes in breathing.     DMII:  Has some neuropathy in feet and CKD.  On diet only and has been stable.  Getting biannual hba1cs.  Has ace allergy.  On Plavix.  Hyperlipidemia:  Mixed.  Refuses statin medications--had some degree of myopathy previously during use.    BP:  Well controlled with her hctz and coreg.  Historically dizzy with more medication.  Osteopenia: continues on ca with D.  Due for bone density.    Still does not sleep well, but she does not want medications for this due to side effects.  She is used to it.    Review of Systems:  Review of Systems  Constitutional: Negative for fever, chills and  malaise/fatigue.  HENT: Negative for congestion and hearing loss.   Eyes: Positive for blurred vision.  Respiratory: Positive for shortness of breath.   Cardiovascular: Positive for leg swelling. Negative for chest pain.       Left ankle swelling chronic  Gastrointestinal: Negative for constipation, blood in stool and melena.  Genitourinary: Negative for dysuria and frequency.       Occasional urgency at night to get to restroom  Musculoskeletal: Positive for joint pain and falls.       One fall--was in [place] on vacation at condo--cut through on stepping stones which were uneven, could not reach house to hold onto--no injury, not even any bruises  Skin: Negative for itching and rash.  Neurological: Positive for tingling and focal weakness. Negative for dizziness, sensory change, speech change, loss of consciousness and weakness.       Of bilateral soles of feet  Endo/Heme/Allergies: Bruises/bleeds easily.  Psychiatric/Behavioral: Negative for depression and memory loss. The patient has insomnia.        Sleeping remains lousy, unchanged, used to it    Past Medical History  Diagnosis Date  . Unspecified vitamin D deficiency   . Hyperlipidemia LDL goal < 100   . Osteoarthrosis, unspecified whether generalized or localized, lower leg   . Dermatophytosis of groin  and perianal area   . Cervicalgia   . Insomnia, unspecified   . Chronic kidney disease, stage III (moderate)   . Type II or unspecified type diabetes mellitus with renal manifestations, not stated as uncontrolled   . Other abnormal glucose   . Other B-complex deficiencies   . Other malaise and fatigue   . Senile cataract, unspecified   . Unspecified essential hypertension   . Muscle weakness (generalized)   . Tachycardia, unspecified   . Palpitations   . Undiagnosed cardiac murmurs   . Shortness of breath     Past Surgical History  Procedure Laterality Date  . Tonsillectomy  1944  . Tubal ligation  1951  . Toe amputation   2002  . Eye surgery      Allergies  Allergen Reactions  . Ace Inhibitors Cough  . Simvastatin Other (See Comments)    Body aches and pains   Medications: Patient's Medications  New Prescriptions   No medications on file  Previous Medications   CALCIUM-VITAMIN D (OSCAL WITH D) 500-200 MG-UNIT PER TABLET    Take 1 tablet by mouth.   CARVEDILOL (COREG) 12.5 MG TABLET    Take 1 tablet (12.5 mg total) by mouth 2 (two) times daily with a meal. For blood pressure   CHOLECALCIFEROL (VITAMIN D) 1000 UNITS TABLET    Take 1,000 Units by mouth daily.   CLOPIDOGREL (PLAVIX) 75 MG TABLET    Take 1 tablet (75 mg total) by mouth daily with breakfast.   CYANOCOBALAMIN 1000 MCG TABLET    Take 100 mcg by mouth daily.   HYDROCHLOROTHIAZIDE (HYDRODIURIL) 25 MG TABLET    Take 1 tablet (25 mg total) by mouth daily as needed. Swelling   TRAMADOL (ULTRAM) 50 MG TABLET    Take one tablet twice daily as needed for neck pain  Modified Medications   No medications on file  Discontinued Medications   No medications on file    Physical Exam: Filed Vitals:   11/16/14 1005  BP: 132/80  Pulse: 64  Temp: 97.7 F (36.5 C)  TempSrc: Oral  Resp: 16  Height: 5\' 5"  (1.651 m)  Weight: 161 lb (73.029 kg)   Physical Exam  Constitutional: She is oriented to person, place, and time. She appears well-developed and well-nourished. No distress.  HENT:  Head: Normocephalic and atraumatic.  Right Ear: External ear normal.  Left Ear: External ear normal.  Nose: Nose normal.  Mouth/Throat: Oropharynx is clear and moist. No oropharyngeal exudate.  Eyes: Conjunctivae and EOM are normal. Pupils are equal, round, and reactive to light. No scleral icterus.  Neck: Normal range of motion. Neck supple. No JVD present. No tracheal deviation present. No thyromegaly present.  Cardiovascular: Normal rate, regular rhythm, normal heart sounds and intact distal pulses.   Pulmonary/Chest: Effort normal and breath sounds normal. No  respiratory distress.  Abdominal: Soft. Bowel sounds are normal. She exhibits no distension. There is no tenderness.  Musculoskeletal: Normal range of motion. She exhibits tenderness.  Small medial joint effusion left knee; crepitus bilateral knees  Neurological: She is alert and oriented to person, place, and time. No cranial nerve deficit. She exhibits normal muscle tone.  Right leg weakness 4+/5 (mild foot drop)  Skin: Skin is warm and dry.  Psychiatric: She has a normal mood and affect. Her behavior is normal. Judgment and thought content normal.    Labs reviewed: Basic Metabolic Panel:  Recent Labs  16/10/96 0830 07/14/14 0806  NA 141 144  K  4.1 4.4  CL 101 105  CO2 24 23  GLUCOSE 134* 124*  BUN 23 19  CREATININE 0.93 1.00  CALCIUM 9.1 8.9   Liver Function Tests:  Recent Labs  07/14/14 0806  AST 13  ALT 12  ALKPHOS 78  BILITOT 0.6  PROT 6.3   No results for input(s): LIPASE, AMYLASE in the last 8760 hours. No results for input(s): AMMONIA in the last 8760 hours. CBC:  Recent Labs  04/14/14 0817 07/14/14 0806  WBC 6.0 5.6  NEUTROABS 4.0 3.7  HGB 14.0  --   HCT 42.0 40.4  MCV 88  --    Lipid Panel:  Recent Labs  01/07/14 0830 04/14/14 0817  CHOL 181 181  HDL 24* 25*  LDLCALC 98 99  TRIG 293* 286*  CHOLHDL 7.5* 7.2*   Lab Results  Component Value Date   HGBA1C 6.0* 07/14/2014    Procedures since last visit: EKG today:  Unchanged from 2/15 EKG.  Sinus rhythm at 60, RBBB, old anteroseptal MI  Assessment/Plan 1. Pre-op evaluation - EKG 12-Lead was unchanged -pt with h/o stroke on plavix so will need to hold for 7 days preop -h/o old MI so some risk present due to her lipids being elevated, but she did not tolerate statin medications and does not want meds for this -No chest pain, breathing stable -diabetes is well controlled on diet only -she is cleared for surgery  2. Renal hypertension, stage 1-4 or unspecified chronic kidney disease -  bp well controlled and renal functions stable -avoids nsaids and nephrotoxic agents - EKG 12-Lead  3. Hyperlipidemia -LDL and TG historically elevated, but will not take meds--no changes - EKG 12-Lead  4. Primary osteoarthritis of both knees -for left TKA first in Nov or sooner if someone cancels due to her mild right leg weakness still present from her stroke -left also has small effusion vs. Right  5. Insomnia -chronic, avoids meds due to side effects, is used to this  6. History of stroke - with right leg weakness and handwriting still not baseline, but hand grips and strength excellent -using cane more for her knees than her leg weakness - Comprehensive metabolic panel; Future - Lipid panel; Future  7. Right leg weakness -d/t stroke; see #6  8. Type 2 diabetes mellitus with renal manifestations, controlled - well controlled on diet only - Hemoglobin A1c; Future - Comprehensive metabolic panel; Future  Labs/tests ordered: reassess before annual exam though I'm sure they will also be done preoperatively   Orders Placed This Encounter  Procedures  . Hemoglobin A1c    Standing Status: Future     Number of Occurrences:      Standing Expiration Date: 07/17/2015  . Comprehensive metabolic panel    Standing Status: Future     Number of Occurrences:      Standing Expiration Date: 07/17/2015    Order Specific Question:  Has the patient fasted?    Answer:  Yes  . Lipid panel    Standing Status: Future     Number of Occurrences:      Standing Expiration Date: 07/17/2015    Order Specific Question:  Has the patient fasted?    Answer:  Yes  . CBC with Differential/Platelet    Standing Status: Future     Number of Occurrences:      Standing Expiration Date: 07/17/2015  . EKG 12-Lead    Next appt:  4 mos for annual exam with labs before; will need urine  microalbumin then  Cayce Quezada L. Elita Dame, D.O. Geriatrics [facility] [address], [place] [address] Cell Phone (Mon-Fri 8am-5pm):  [phone] On Call:  [phone] & follow prompts after 5pm & weekends Office Phone:  [phone] Office Fax:  [phone]

## 2014-11-16 NOTE — Addendum Note (Signed)
Addended by: Edison Simon C on: 11/16/2014 05:00 PM   Modules accepted: Orders

## 2014-11-18 ENCOUNTER — Other Ambulatory Visit: Payer: Self-pay | Admitting: Orthopedic Surgery

## 2014-12-07 DIAGNOSIS — H34831 Tributary (branch) retinal vein occlusion, right eye: Secondary | ICD-10-CM | POA: Diagnosis not present

## 2014-12-28 NOTE — H&P (Signed)
TOTAL KNEE ADMISSION H&P  Patient is being admitted for left total knee arthroplasty.  Subjective:  Chief Complaint:left knee pain.  HPI: Lisa [name], 81 y.o. female, has a history of pain and functional disability in the left knee due to arthritis and has failed non-surgical conservative treatments for greater than 12 weeks to includeNSAID's and/or analgesics, corticosteriod injections, viscosupplementation injections, weight reduction as appropriate and activity modification.  Onset of symptoms was gradual, starting 5 years ago with gradually worsening course since that time. The patient noted prior procedures on the knee to include  n/a on the left knee(s).  Patient currently rates pain in the left knee(s) at 7 out of 10 with activity. Patient has night pain, worsening of pain with activity and weight bearing, pain that interferes with activities of daily living, pain with passive range of motion, crepitus and joint swelling.  Patient has evidence of subchondral sclerosis, periarticular osteophytes and joint space narrowing by imaging studies. This patient has had osteoarthritis. There is no active infection.  Patient Active Problem List   Diagnosis Date Noted  . Dry skin 01/28/2014  . HLD (hyperlipidemia) 01/06/2014  . Essential hypertension 01/06/2014  . B12 deficiency 09/29/2013  . Mixed hyperlipidemia 09/29/2013  . Hypertension, renal disease 06/26/2013  . Seasonal allergies 06/26/2013  . CVA (cerebral vascular accident) (HCC) 04/09/2013  . Stroke (HCC) 04/09/2013  . Insomnia 09/14/2012  . Hyperlipidemia LDL goal < 100   . Osteoarthrosis, unspecified whether generalized or localized, lower leg   . Undiagnosed cardiac murmurs   . Shortness of breath   . Type 2 diabetes mellitus with renal manifestations, controlled (HCC)   . Chronic kidney disease, stage III (moderate)   . Cervicalgia   . HYPERTENSION 11/30/2008  . DYSPNEA 11/30/2008  . TACHYCARDIA, HX OF 11/30/2008    Past Medical History  Diagnosis Date  . Unspecified vitamin D deficiency   . Hyperlipidemia LDL goal < 100   . Osteoarthrosis, unspecified whether generalized or localized, lower leg   . Dermatophytosis of groin and perianal area   . Cervicalgia   . Insomnia, unspecified   . Chronic kidney disease, stage III (moderate)   . Type II or unspecified type diabetes mellitus with renal manifestations, not stated as uncontrolled   . Other abnormal glucose   . Other B-complex deficiencies   . Other malaise and fatigue   . Senile cataract, unspecified   . Unspecified essential hypertension   . Muscle weakness (generalized)   . Tachycardia, unspecified   . Palpitations   . Undiagnosed cardiac murmurs   . Shortness of breath     Past Surgical History  Procedure Laterality Date  . Tonsillectomy  1944  . Tubal ligation  1951  . Toe amputation  2002  . Eye surgery      No prescriptions prior to admission   Allergies  Allergen Reactions  . Ace Inhibitors Cough  . Simvastatin Other (See Comments)    Body aches and pains    Social History  Substance Use Topics  . Smoking status: Never Smoker   . Smokeless tobacco: Never Used  . Alcohol Use: No    Family History  Problem Relation Age of Onset  . Cerebral aneurysm Mother   . Heart attack Father      Review of Systems  HENT: Negative.   Eyes: Negative.   Respiratory: Negative.   Cardiovascular: Negative.   Gastrointestinal: Negative.   Genitourinary: Negative.   Musculoskeletal: Positive for joint pain.  Skin: Negative.  Neurological: Positive for weakness.  Endo/Heme/Allergies: Negative.   Psychiatric/Behavioral: Negative.     Objective:  Physical Exam  Constitutional: She is oriented to person, place, and time. She appears well-developed.  HENT:  Head: Normocephalic.  Eyes: EOM are normal.  Neck: Normal range of motion.  Cardiovascular: Normal rate and intact distal pulses.   Murmur heard. Respiratory: Effort  normal and breath sounds normal.  GI: Soft. Bowel sounds are normal.  Genitourinary:  Deferred  Musculoskeletal:  Pain with ROM. PAIN with WB. BLE N/v grossly intact.  Neurological: She is alert and oriented to person, place, and time.  Skin: Skin is warm and dry.  Psychiatric: Her behavior is normal.    Vital signs in last 24 hours:    Labs:   Estimated body mass index is 26.79 kg/(m^2) as calculated from the following:   Height as of 11/16/14:  (1.651 m).   Weight as of 11/16/14: 73.029 kg (161 lb).   Imaging Review Plain radiographs demonstrate severe degenerative joint disease of the left knee(s). The overall alignment ismild varus. The bone quality appears to be good for age and reported activity level.  Assessment/Plan:  End stage arthritis, left knee   The patient history, physical examination, clinical judgment of the provider and imaging studies are consistent with end stage degenerative joint disease of the left knee(s) and total knee arthroplasty is deemed medically necessary. The treatment options including medical management, injection therapy arthroscopy and arthroplasty were discussed at length. The risks and benefits of total knee arthroplasty were presented and reviewed. The risks due to aseptic loosening, infection, stiffness, patella tracking problems, thromboembolic complications and other imponderables were discussed. The patient acknowledged the explanation, agreed to proceed with the plan and consent was signed. Patient is being admitted for inpatient treatment for surgery, pain control, PT, OT, prophylactic antibiotics, VTE prophylaxis, progressive ambulation and ADL's and discharge planning. The patient is planning to be discharged to skilled nursing facility.  Contraindications and adverse affects of Tranexamic acid discussed in detail. Patient is not a candidate.

## 2015-01-08 ENCOUNTER — Encounter (HOSPITAL_COMMUNITY)
Admission: RE | Admit: 2015-01-08 | Discharge: 2015-01-08 | Disposition: A | Discharge status: Home / Self Care | Payer: Medicare Other | Source: Ambulatory Visit | Attending: Specialist | Admitting: Specialist

## 2015-01-08 ENCOUNTER — Encounter (HOSPITAL_COMMUNITY): Payer: Self-pay

## 2015-01-08 DIAGNOSIS — M179 Osteoarthritis of knee, unspecified: Secondary | ICD-10-CM | POA: Diagnosis not present

## 2015-01-08 DIAGNOSIS — Z01818 Encounter for other preprocedural examination: Secondary | ICD-10-CM | POA: Diagnosis not present

## 2015-01-08 HISTORY — DX: Cerebral infarction, unspecified: I63.9

## 2015-01-08 LAB — SURGICAL PCR SCREEN
MRSA, PCR: NEGATIVE
Staphylococcus aureus: NEGATIVE

## 2015-01-08 LAB — URINALYSIS, ROUTINE W REFLEX MICROSCOPIC
Bilirubin Urine: NEGATIVE
GLUCOSE, UA: NEGATIVE mg/dL
KETONES UR: NEGATIVE mg/dL
NITRITE: NEGATIVE
PH: 6 (ref 5.0–8.0)
PROTEIN: NEGATIVE mg/dL
Specific Gravity, Urine: 1.017 (ref 1.005–1.030)
Urobilinogen, UA: 0.2 mg/dL (ref 0.0–1.0)

## 2015-01-08 LAB — CBC
HCT: 41.6 % (ref 36.0–46.0)
Hemoglobin: 13.3 g/dL (ref 12.0–15.0)
MCH: 29.5 pg (ref 26.0–34.0)
MCHC: 32 g/dL (ref 30.0–36.0)
MCV: 92.2 fL (ref 78.0–100.0)
Platelets: 190 10*3/uL (ref 150–400)
RBC: 4.51 MIL/uL (ref 3.87–5.11)
RDW: 13.3 % (ref 11.5–15.5)
WBC: 6.6 10*3/uL (ref 4.0–10.5)

## 2015-01-08 LAB — URINE MICROSCOPIC-ADD ON

## 2015-01-08 LAB — BASIC METABOLIC PANEL
Anion gap: 8 (ref 5–15)
BUN: 22 mg/dL — AB (ref 6–20)
CALCIUM: 9.1 mg/dL (ref 8.9–10.3)
CO2: 26 mmol/L (ref 22–32)
Chloride: 107 mmol/L (ref 101–111)
Creatinine, Ser: 1 mg/dL (ref 0.44–1.00)
GFR calc Af Amer: 55 mL/min — ABNORMAL LOW (ref >60)
GFR, EST NON AFRICAN AMERICAN: 48 mL/min — AB (ref >60)
GLUCOSE: 123 mg/dL — AB (ref 65–99)
Potassium: 4.5 mmol/L (ref 3.5–5.1)
Sodium: 141 mmol/L (ref 135–145)

## 2015-01-08 LAB — PROTIME-INR
INR: 1.21 (ref 0.00–1.49)
PROTHROMBIN TIME: 15.4 s — AB (ref 11.6–15.2)

## 2015-01-08 LAB — APTT: APTT: 35 s (ref 24–37)

## 2015-01-08 LAB — ABO/RH: ABO/RH(D): AB POS

## 2015-01-08 NOTE — Patient Instructions (Signed)
20 Lisa [name]  01/08/2015   Your procedure is scheduled on:   01-15-2015 Friday  [facility] and follow signs to [facility]. Arrive at     0530   AM .  (Limit 1 person with you).  Call this number if you have problems the morning of surgery: [phone]  Or Presurgical Testing [phone].   For Living Will and/or Health Care Power Attorney Forms: please provide copy for your medical record,may bring AM of surgery(Forms should be already notarized -we do not provide this service).(Yes,/ Documents with Medical records 06-26-13 ).      Do not eat food/ or drink: After Midnight.     Take these medicines the morning of surgery with A SIP OF WATER-   (DO NOT TAKE ANY DIABETIC MEDS AM OF SURGERY) : Carvedilol   Do not wear jewelry, make-up or nail polish.  Do not wear deodorant, lotions, powders, or perfumes.   Do not shave legs and under arms- 48 hours(2 days) prior to first CHG shower.(Shaving face and neck okay.)  Do not bring valuables to the hospital.(Hospital is not responsible for lost valuables).  Contacts, dentures or removable bridgework, body piercing, hair pins may not be worn into surgery.  Leave suitcase in the car. After surgery it may be brought to your room.  For patients admitted to the hospital, checkout time is 11:00 AM the day of discharge.(Restricted visitors-Any Persons displaying flu-like symptoms or illness).    Patients discharged the day of surgery will not be allowed to drive home. Must have responsible person with you x 24 hours once discharged.  Name and phone number of your driver: Ezra SitesKathy Smith, daughter [phone]      Please read over the following fact sheets that you were given:  CHG(Chlorhexidine Gluconate 4% Surgical Soap) use, MRSA Information, Blood Transfusion fact sheet, Incentive Spirometry Instruction.  Remember : Type/Screen "Blue armbands" - may not be removed once applied(would result in being  retested AM of surgery, if removed).         [facility] - Preparing for Surgery Before surgery, you can play an important role.  Because skin is not sterile, your skin needs to be as free of germs as possible.  You can reduce the number of germs on your skin by washing with CHG (chlorahexidine gluconate) soap before surgery.  CHG is an antiseptic cleaner which kills germs and bonds with the skin to continue killing germs even after washing. Please DO NOT use if you have an allergy to CHG or antibacterial soaps.  If your skin becomes reddened/irritated stop using the CHG and inform your nurse when you arrive at Short Stay. Do not shave (including legs and underarms) for at least 48 hours prior to the first CHG shower.  You may shave your face/neck. Please follow these instructions carefully:  1.  Shower with CHG Soap the night before surgery and the  morning of Surgery.  2.  If you choose to wash your hair, wash your hair first as usual with your  normal  shampoo.  3.  After you shampoo, rinse your hair and body thoroughly to remove the  shampoo.                           4.  Use CHG as you would any other liquid soap.  You can apply chg directly  to the skin and wash  Gently with a scrungie or clean washcloth.  5.  Apply the CHG Soap to your body ONLY FROM THE NECK DOWN.   Do not use on face/ open                           Wound or open sores. Avoid contact with eyes, ears mouth and genitals (private parts).                       Wash face,  Genitals (private parts) with your normal soap.             6.  Wash thoroughly, paying special attention to the area where your surgery  will be performed.  7.  Thoroughly rinse your body with warm water from the neck down.  8.  DO NOT shower/wash with your normal soap after using and rinsing off  the CHG Soap.                9.  Pat yourself dry with a clean towel.            10.  Wear clean pajamas.            11.  Place clean  sheets on your bed the night of your first shower and do not  sleep with pets. Day of Surgery : Do not apply any lotions/deodorants the morning of surgery.  Please wear clean clothes to the hospital/surgery center.  FAILURE TO FOLLOW THESE INSTRUCTIONS MAY RESULT IN THE CANCELLATION OF YOUR SURGERY PATIENT SIGNATURE_________________________________  NURSE SIGNATURE__________________________________  ________________________________________________________________________   Adam Phenix  An incentive spirometer is a tool that can help keep your lungs clear and active. This tool measures how well you are filling your lungs with each breath. Taking long deep breaths may help reverse or decrease the chance of developing breathing (pulmonary) problems (especially infection) following:  A long period of time when you are unable to move or be active. BEFORE THE PROCEDURE   If the spirometer includes an indicator to show your best effort, your nurse or respiratory therapist will set it to a desired goal.  If possible, sit up straight or lean slightly forward. Try not to slouch.  Hold the incentive spirometer in an upright position. INSTRUCTIONS FOR USE   Sit on the edge of your bed if possible, or sit up as far as you can in bed or on a chair.  Hold the incentive spirometer in an upright position.  Breathe out normally.  Place the mouthpiece in your mouth and seal your lips tightly around it.  Breathe in slowly and as deeply as possible, raising the piston or the ball toward the top of the column.  Hold your breath for 3-5 seconds or for as long as possible. Allow the piston or ball to fall to the bottom of the column.  Remove the mouthpiece from your mouth and breathe out normally.  Rest for a few seconds and repeat Steps 1 through 7 at least 10 times every 1-2 hours when you are awake. Take your time and take a few normal breaths between deep breaths.  The spirometer may  include an indicator to show your best effort. Use the indicator as a goal to work toward during each repetition.  After each set of 10 deep breaths, practice coughing to be sure your lungs are clear. If you have an incision (the cut made at the time of  surgery), support your incision when coughing by placing a pillow or rolled up towels firmly against it. Once you are able to get out of bed, walk around indoors and cough well. You may stop using the incentive spirometer when instructed by your caregiver.  RISKS AND COMPLICATIONS  Take your time so you do not get dizzy or light-headed.  If you are in pain, you may need to take or ask for pain medication before doing incentive spirometry. It is harder to take a deep breath if you are having pain. AFTER USE  Rest and breathe slowly and easily.  It can be helpful to keep track of a log of your progress. Your caregiver can provide you with a simple table to help with this. If you are using the spirometer at home, follow these instructions: [place] IF:   You are having difficultly using the spirometer.  You have trouble using the spirometer as often as instructed.  Your pain medication is not giving enough relief while using the spirometer.  You develop fever of 100.5 F (38.1 C) or higher. SEEK IMMEDIATE MEDICAL CARE IF:   You cough up bloody sputum that had not been present before.  You develop fever of 102 F (38.9 C) or greater.  You develop worsening pain at or near the incision site. MAKE SURE YOU:   Understand these instructions.  Will watch your condition.  Will get help right away if you are not doing well or get worse. Document Released: 07/17/2006 Document Revised: 05/29/2011 Document Reviewed: 09/17/2006 ExitCare Patient Information 2014 Exit[employer], [place].   ________________________________________________________________________  WHAT IS A BLOOD TRANSFUSION? Blood Transfusion Information  A transfusion  is the replacement of blood or some of its parts. Blood is made up of multiple cells which provide different functions.  Red blood cells carry oxygen and are used for blood loss replacement.  White blood cells fight against infection.  Platelets control bleeding.  Plasma helps clot blood.  Other blood products are available for specialized needs, such as hemophilia or other clotting disorders. BEFORE THE TRANSFUSION  Who gives blood for transfusions?   Healthy volunteers who are fully evaluated to make sure their blood is safe. This is blood bank blood. Transfusion therapy is the safest it has ever been in the practice of medicine. Before blood is taken from a donor, a complete history is taken to make sure that person has no history of diseases nor engages in risky social behavior (examples are intravenous drug use or sexual activity with multiple partners). The donor's travel history is screened to minimize risk of transmitting infections, such as malaria. The donated blood is tested for signs of infectious diseases, such as HIV and hepatitis. The blood is then tested to be sure it is compatible with you in order to minimize the chance of a transfusion reaction. If you or a relative donates blood, this is often done in anticipation of surgery and is not appropriate for emergency situations. It takes many days to process the donated blood. RISKS AND COMPLICATIONS Although transfusion therapy is very safe and saves many lives, the main dangers of transfusion include:   Getting an infectious disease.  Developing a transfusion reaction. This is an allergic reaction to something in the blood you were given. Every precaution is taken to prevent this. The decision to have a blood transfusion has been considered carefully by your caregiver before blood is given. Blood is not given unless the benefits outweigh the risks. AFTER THE TRANSFUSION  Right after receiving a blood transfusion, you will  usually feel much better and more energetic. This is especially true if your red blood cells have gotten low (anemic). The transfusion raises the level of the red blood cells which carry oxygen, and this usually causes an energy increase.  The nurse administering the transfusion will monitor you carefully for complications. HOME CARE INSTRUCTIONS  No special instructions are needed after a transfusion. You may find your energy is better. Speak with your caregiver about any limitations on activity for underlying diseases you may have. SEEK MEDICAL CARE IF:   Your condition is not improving after your transfusion.  You develop redness or irritation at the intravenous (IV) site. SEEK IMMEDIATE MEDICAL CARE IF:  Any of the following symptoms occur over the next 12 hours:  Shaking chills.  You have a temperature by mouth above 102 F (38.9 C), not controlled by medicine.  Chest, back, or muscle pain.  People around you feel you are not acting correctly or are confused.  Shortness of breath or difficulty breathing.  Dizziness and fainting.  You get a rash or develop hives.  You have a decrease in urine output.  Your urine turns a dark color or changes to pink, red, or brown. Any of the following symptoms occur over the next 10 days:  You have a temperature by mouth above 102 F (38.9 C), not controlled by medicine.  Shortness of breath.  Weakness after normal activity.  The white part of the eye turns yellow (jaundice).  You have a decrease in the amount of urine or are urinating less often.  Your urine turns a dark color or changes to pink, red, or brown. Document Released: 03/03/2000 Document Revised: 05/29/2011 Document Reviewed: 10/21/2007 [facility] Patient Information 2014 [place], [place].  _______________________________________________________________________

## 2015-01-08 NOTE — Pre-Procedure Instructions (Addendum)
EKG 11-15-14, Clearance note(Dr. Renato Gailseed) 11-16-14 with chart. 01-08-15 1515 labs viewable in Epic-note urinalysis- note per Epic to [phone] Dr. France Ravensollin's office.

## 2015-01-08 NOTE — Progress Notes (Signed)
01-08-15 1510 labs viewable in Epic-note urinalysis, preferred Pharmacy [facility] [address] [phone]

## 2015-01-11 DIAGNOSIS — H34831 Tributary (branch) retinal vein occlusion, right eye, with macular edema: Secondary | ICD-10-CM | POA: Diagnosis not present

## 2015-01-15 ENCOUNTER — Encounter (HOSPITAL_COMMUNITY)
Admission: AD | Disposition: A | Discharge status: Skilled Nursing Facility | Payer: Self-pay | Source: Ambulatory Visit | Attending: Specialist

## 2015-01-15 ENCOUNTER — Inpatient Hospital Stay (HOSPITAL_COMMUNITY)
Admission: AD | Admit: 2015-01-15 | Discharge: 2015-01-18 | DRG: 470 | Disposition: A | Discharge status: Skilled Nursing Facility | Payer: Medicare Other | Source: Ambulatory Visit | Attending: Specialist | Admitting: Specialist

## 2015-01-15 ENCOUNTER — Encounter (HOSPITAL_COMMUNITY): Payer: Self-pay | Admitting: Certified Registered Nurse Anesthetist

## 2015-01-15 ENCOUNTER — Inpatient Hospital Stay (HOSPITAL_COMMUNITY): Payer: Medicare Other | Admitting: Certified Registered Nurse Anesthetist

## 2015-01-15 DIAGNOSIS — Z89429 Acquired absence of other toe(s), unspecified side: Secondary | ICD-10-CM

## 2015-01-15 DIAGNOSIS — N183 Chronic kidney disease, stage 3 (moderate): Secondary | ICD-10-CM | POA: Diagnosis present

## 2015-01-15 DIAGNOSIS — E1122 Type 2 diabetes mellitus with diabetic chronic kidney disease: Secondary | ICD-10-CM | POA: Diagnosis not present

## 2015-01-15 DIAGNOSIS — J302 Other seasonal allergic rhinitis: Secondary | ICD-10-CM | POA: Diagnosis not present

## 2015-01-15 DIAGNOSIS — I10 Essential (primary) hypertension: Secondary | ICD-10-CM | POA: Diagnosis not present

## 2015-01-15 DIAGNOSIS — R110 Nausea: Secondary | ICD-10-CM | POA: Diagnosis not present

## 2015-01-15 DIAGNOSIS — M1712 Unilateral primary osteoarthritis, left knee: Secondary | ICD-10-CM | POA: Diagnosis not present

## 2015-01-15 DIAGNOSIS — K5900 Constipation, unspecified: Secondary | ICD-10-CM | POA: Diagnosis not present

## 2015-01-15 DIAGNOSIS — I129 Hypertensive chronic kidney disease with stage 1 through stage 4 chronic kidney disease, or unspecified chronic kidney disease: Secondary | ICD-10-CM | POA: Diagnosis present

## 2015-01-15 DIAGNOSIS — E782 Mixed hyperlipidemia: Secondary | ICD-10-CM | POA: Diagnosis not present

## 2015-01-15 DIAGNOSIS — Z96652 Presence of left artificial knee joint: Secondary | ICD-10-CM | POA: Diagnosis not present

## 2015-01-15 DIAGNOSIS — E785 Hyperlipidemia, unspecified: Secondary | ICD-10-CM | POA: Diagnosis not present

## 2015-01-15 DIAGNOSIS — M179 Osteoarthritis of knee, unspecified: Secondary | ICD-10-CM | POA: Diagnosis not present

## 2015-01-15 DIAGNOSIS — R278 Other lack of coordination: Secondary | ICD-10-CM | POA: Diagnosis not present

## 2015-01-15 DIAGNOSIS — M25562 Pain in left knee: Secondary | ICD-10-CM | POA: Diagnosis not present

## 2015-01-15 DIAGNOSIS — Z01812 Encounter for preprocedural laboratory examination: Secondary | ICD-10-CM

## 2015-01-15 DIAGNOSIS — M25662 Stiffness of left knee, not elsewhere classified: Secondary | ICD-10-CM | POA: Diagnosis not present

## 2015-01-15 DIAGNOSIS — Z471 Aftercare following joint replacement surgery: Secondary | ICD-10-CM | POA: Diagnosis not present

## 2015-01-15 DIAGNOSIS — M6281 Muscle weakness (generalized): Secondary | ICD-10-CM | POA: Diagnosis not present

## 2015-01-15 DIAGNOSIS — Z96659 Presence of unspecified artificial knee joint: Secondary | ICD-10-CM

## 2015-01-15 DIAGNOSIS — Z8673 Personal history of transient ischemic attack (TIA), and cerebral infarction without residual deficits: Secondary | ICD-10-CM

## 2015-01-15 DIAGNOSIS — D519 Vitamin B12 deficiency anemia, unspecified: Secondary | ICD-10-CM | POA: Diagnosis not present

## 2015-01-15 HISTORY — PX: TOTAL KNEE ARTHROPLASTY: SHX125

## 2015-01-15 LAB — GLUCOSE, CAPILLARY
GLUCOSE-CAPILLARY: 127 mg/dL — AB (ref 65–99)
GLUCOSE-CAPILLARY: 128 mg/dL — AB (ref 65–99)
GLUCOSE-CAPILLARY: 148 mg/dL — AB (ref 65–99)
Glucose-Capillary: 115 mg/dL — ABNORMAL HIGH (ref 65–99)

## 2015-01-15 LAB — TYPE AND SCREEN
ABO/RH(D): AB POS
Antibody Screen: NEGATIVE

## 2015-01-15 SURGERY — ARTHROPLASTY, KNEE, TOTAL
Anesthesia: Spinal | Site: Knee | Laterality: Left

## 2015-01-15 MED ORDER — ACETAMINOPHEN 325 MG PO TABS
650.0000 mg | ORAL_TABLET | Freq: Four times a day (QID) | ORAL | Status: DC | PRN
  Administered 2015-01-17 (×2): 650 mg via ORAL
  Filled 2015-01-15 (×2): qty 2

## 2015-01-15 MED ORDER — CLOPIDOGREL BISULFATE 75 MG PO TABS
75.0000 mg | ORAL_TABLET | Freq: Every day | ORAL | Status: DC
  Administered 2015-01-16 – 2015-01-18 (×3): 75 mg via ORAL
  Filled 2015-01-15 (×4): qty 1

## 2015-01-15 MED ORDER — CARVEDILOL 12.5 MG PO TABS
12.5000 mg | ORAL_TABLET | Freq: Two times a day (BID) | ORAL | Status: DC
  Administered 2015-01-15 – 2015-01-18 (×6): 12.5 mg via ORAL
  Filled 2015-01-15 (×8): qty 1

## 2015-01-15 MED ORDER — POVIDONE-IODINE 7.5 % EX SOLN: Freq: Once | CUTANEOUS | Status: DC

## 2015-01-15 MED ORDER — SODIUM CHLORIDE 0.9 % IV SOLN
10.0000 mg | INTRAVENOUS | Status: DC | PRN
  Administered 2015-01-15: 20 ug/min via INTRAVENOUS

## 2015-01-15 MED ORDER — ASPIRIN EC 325 MG PO TBEC: 325.0000 mg | DELAYED_RELEASE_TABLET | Freq: Two times a day (BID) | ORAL | Status: DC

## 2015-01-15 MED ORDER — FLUTICASONE PROPIONATE 50 MCG/ACT NA SUSP: 1.0000 | Freq: Every day | NASAL | Status: DC | PRN

## 2015-01-15 MED ORDER — MEPERIDINE HCL 50 MG/ML IJ SOLN: 6.2500 mg | INTRAMUSCULAR | Status: DC | PRN

## 2015-01-15 MED ORDER — PHENOL 1.4 % MT LIQD: 1.0000 | OROMUCOSAL | Status: DC | PRN

## 2015-01-15 MED ORDER — KETOROLAC TROMETHAMINE 30 MG/ML IJ SOLN
INTRAMUSCULAR | Status: DC | PRN
  Administered 2015-01-15: 30 mg

## 2015-01-15 MED ORDER — ALUM & MAG HYDROXIDE-SIMETH 200-200-20 MG/5ML PO SUSP
30.0000 mL | ORAL | Status: DC | PRN
  Administered 2015-01-17: 30 mL via ORAL
  Filled 2015-01-15: qty 30

## 2015-01-15 MED ORDER — DEXAMETHASONE SODIUM PHOSPHATE 10 MG/ML IJ SOLN: 10.0000 mg | Freq: Once | INTRAMUSCULAR | Status: DC

## 2015-01-15 MED ORDER — FLEET ENEMA 7-19 GM/118ML RE ENEM
1.0000 | ENEMA | Freq: Once | RECTAL | Status: AC | PRN
  Administered 2015-01-18: 1 via RECTAL
  Filled 2015-01-15: qty 1

## 2015-01-15 MED ORDER — ACETAMINOPHEN 650 MG RE SUPP: 650.0000 mg | Freq: Four times a day (QID) | RECTAL | Status: DC | PRN

## 2015-01-15 MED ORDER — CEFAZOLIN SODIUM-DEXTROSE 2-3 GM-% IV SOLR
2.0000 g | INTRAVENOUS | Status: AC
  Administered 2015-01-15: 2 g via INTRAVENOUS

## 2015-01-15 MED ORDER — PROMETHAZINE HCL 25 MG/ML IJ SOLN: 6.2500 mg | INTRAMUSCULAR | Status: DC | PRN

## 2015-01-15 MED ORDER — PROPOFOL 10 MG/ML IV BOLUS
INTRAVENOUS | Status: AC
  Filled 2015-01-15: qty 20

## 2015-01-15 MED ORDER — METOCLOPRAMIDE HCL 10 MG PO TABS: 5.0000 mg | ORAL_TABLET | Freq: Three times a day (TID) | ORAL | Status: DC | PRN

## 2015-01-15 MED ORDER — ONDANSETRON HCL 4 MG PO TABS: 4.0000 mg | ORAL_TABLET | Freq: Four times a day (QID) | ORAL | Status: DC | PRN

## 2015-01-15 MED ORDER — CEFAZOLIN SODIUM-DEXTROSE 2-3 GM-% IV SOLR
INTRAVENOUS | Status: AC
  Filled 2015-01-15: qty 50

## 2015-01-15 MED ORDER — POLYETHYLENE GLYCOL 3350 17 G PO PACK
17.0000 g | PACK | Freq: Every day | ORAL | Status: DC | PRN
  Administered 2015-01-18: 17 g via ORAL
  Filled 2015-01-15: qty 1

## 2015-01-15 MED ORDER — CEFAZOLIN SODIUM 1-5 GM-% IV SOLN
1.0000 g | Freq: Four times a day (QID) | INTRAVENOUS | Status: AC
  Administered 2015-01-15 (×2): 1 g via INTRAVENOUS
  Filled 2015-01-15 (×2): qty 50

## 2015-01-15 MED ORDER — KETOROLAC TROMETHAMINE 30 MG/ML IJ SOLN
INTRAMUSCULAR | Status: AC
  Filled 2015-01-15: qty 1

## 2015-01-15 MED ORDER — OXYCODONE HCL 5 MG PO TABS
5.0000 mg | ORAL_TABLET | ORAL | Status: DC | PRN
  Administered 2015-01-15: 5 mg via ORAL
  Administered 2015-01-15: 10 mg via ORAL
  Administered 2015-01-15: 5 mg via ORAL
  Administered 2015-01-15 – 2015-01-16 (×6): 10 mg via ORAL
  Administered 2015-01-17: 5 mg via ORAL
  Administered 2015-01-17: 10 mg via ORAL
  Filled 2015-01-15: qty 2
  Filled 2015-01-15: qty 1
  Filled 2015-01-15: qty 2
  Filled 2015-01-15: qty 1
  Filled 2015-01-15 (×3): qty 2
  Filled 2015-01-15: qty 1
  Filled 2015-01-15 (×3): qty 2

## 2015-01-15 MED ORDER — DIPHENHYDRAMINE HCL 12.5 MG/5ML PO ELIX: 12.5000 mg | ORAL_SOLUTION | ORAL | Status: DC | PRN

## 2015-01-15 MED ORDER — FENTANYL CITRATE (PF) 100 MCG/2ML IJ SOLN
INTRAMUSCULAR | Status: DC | PRN
Start: 2015-01-15 — End: 2015-01-15
  Administered 2015-01-15 (×2): 50 ug via INTRAVENOUS

## 2015-01-15 MED ORDER — BACLOFEN 10 MG PO TABS: 10.0000 mg | ORAL_TABLET | Freq: Three times a day (TID) | ORAL | Status: DC

## 2015-01-15 MED ORDER — ACETAMINOPHEN 10 MG/ML IV SOLN
1000.0000 mg | Freq: Once | INTRAVENOUS | Status: DC
  Administered 2015-01-15: 1000 mg via INTRAVENOUS

## 2015-01-15 MED ORDER — SODIUM CHLORIDE 0.9 % IR SOLN
Status: DC | PRN
  Administered 2015-01-15: 3000 mL

## 2015-01-15 MED ORDER — SODIUM CHLORIDE 0.9 % IJ SOLN
INTRAMUSCULAR | Status: DC | PRN
Start: 2015-01-15 — End: 2015-01-15
  Administered 2015-01-15: 30 mL

## 2015-01-15 MED ORDER — METOCLOPRAMIDE HCL 5 MG/ML IJ SOLN
5.0000 mg | Freq: Three times a day (TID) | INTRAMUSCULAR | Status: DC | PRN
  Administered 2015-01-17 (×2): 10 mg via INTRAVENOUS
  Filled 2015-01-15 (×2): qty 2

## 2015-01-15 MED ORDER — FENTANYL CITRATE (PF) 100 MCG/2ML IJ SOLN
INTRAMUSCULAR | Status: AC
  Filled 2015-01-15: qty 4

## 2015-01-15 MED ORDER — BUPIVACAINE-EPINEPHRINE 0.25% -1:200000 IJ SOLN
INTRAMUSCULAR | Status: DC | PRN
  Administered 2015-01-15: 30 mL

## 2015-01-15 MED ORDER — MENTHOL 3 MG MT LOZG: 1.0000 | LOZENGE | OROMUCOSAL | Status: DC | PRN

## 2015-01-15 MED ORDER — SODIUM CHLORIDE 0.9 % IV SOLN: INTRAVENOUS | Status: DC

## 2015-01-15 MED ORDER — DEXTROSE 5 % IV SOLN
500.0000 mg | Freq: Four times a day (QID) | INTRAVENOUS | Status: DC | PRN
  Administered 2015-01-15 – 2015-01-16 (×2): 500 mg via INTRAVENOUS
  Filled 2015-01-15 (×4): qty 5

## 2015-01-15 MED ORDER — HYDROMORPHONE HCL 1 MG/ML IJ SOLN
0.5000 mg | INTRAMUSCULAR | Status: DC | PRN
  Administered 2015-01-15: 0.5 mg via INTRAVENOUS
  Filled 2015-01-15: qty 1

## 2015-01-15 MED ORDER — FERROUS SULFATE 325 (65 FE) MG PO TABS
325.0000 mg | ORAL_TABLET | Freq: Three times a day (TID) | ORAL | Status: DC
Start: 2015-01-15 — End: 2015-01-18
  Administered 2015-01-15 – 2015-01-17 (×6): 325 mg via ORAL
  Filled 2015-01-15 (×11): qty 1

## 2015-01-15 MED ORDER — LACTATED RINGERS IV SOLN
INTRAVENOUS | Status: DC | PRN
  Administered 2015-01-15 (×2): via INTRAVENOUS

## 2015-01-15 MED ORDER — ONDANSETRON HCL 4 MG/2ML IJ SOLN
INTRAMUSCULAR | Status: DC | PRN
  Administered 2015-01-15 (×2): 2 mg via INTRAVENOUS

## 2015-01-15 MED ORDER — FENTANYL CITRATE (PF) 100 MCG/2ML IJ SOLN: 25.0000 ug | INTRAMUSCULAR | Status: DC | PRN

## 2015-01-15 MED ORDER — BUPIVACAINE-EPINEPHRINE (PF) 0.25% -1:200000 IJ SOLN
INTRAMUSCULAR | Status: AC
  Filled 2015-01-15: qty 30

## 2015-01-15 MED ORDER — DEXAMETHASONE SODIUM PHOSPHATE 4 MG/ML IJ SOLN
INTRAMUSCULAR | Status: DC | PRN
  Administered 2015-01-15: 10 mg via INTRAVENOUS

## 2015-01-15 MED ORDER — METHOCARBAMOL 500 MG PO TABS
500.0000 mg | ORAL_TABLET | Freq: Four times a day (QID) | ORAL | Status: DC | PRN
  Administered 2015-01-17 (×2): 500 mg via ORAL
  Filled 2015-01-15 (×4): qty 1

## 2015-01-15 MED ORDER — PROPOFOL 500 MG/50ML IV EMUL
INTRAVENOUS | Status: DC | PRN
  Administered 2015-01-15: 25 ug/kg/min via INTRAVENOUS

## 2015-01-15 MED ORDER — ONDANSETRON HCL 4 MG/2ML IJ SOLN
4.0000 mg | Freq: Four times a day (QID) | INTRAMUSCULAR | Status: DC | PRN
  Administered 2015-01-16 – 2015-01-17 (×3): 4 mg via INTRAVENOUS
  Filled 2015-01-15 (×3): qty 2

## 2015-01-15 MED ORDER — BISACODYL 5 MG PO TBEC
5.0000 mg | DELAYED_RELEASE_TABLET | Freq: Every day | ORAL | Status: DC | PRN
  Administered 2015-01-17: 5 mg via ORAL
  Filled 2015-01-15: qty 1

## 2015-01-15 MED ORDER — SODIUM CHLORIDE 0.9 % IJ SOLN
INTRAMUSCULAR | Status: AC
  Filled 2015-01-15: qty 50

## 2015-01-15 MED ORDER — ACETAMINOPHEN 10 MG/ML IV SOLN
INTRAVENOUS | Status: AC
  Filled 2015-01-15: qty 100

## 2015-01-15 MED ORDER — ENOXAPARIN SODIUM 30 MG/0.3ML ~~LOC~~ SOLN
30.0000 mg | Freq: Two times a day (BID) | SUBCUTANEOUS | Status: DC
  Administered 2015-01-16 – 2015-01-18 (×5): 30 mg via SUBCUTANEOUS
  Filled 2015-01-15 (×7): qty 0.3

## 2015-01-15 MED ORDER — BUPIVACAINE IN DEXTROSE 0.75-8.25 % IT SOLN
INTRATHECAL | Status: DC | PRN
  Administered 2015-01-15: 1.5 mL via INTRATHECAL

## 2015-01-15 MED ORDER — POTASSIUM CHLORIDE IN NACL 20-0.9 MEQ/L-% IV SOLN
INTRAVENOUS | Status: DC
Start: 2015-01-15 — End: 2015-01-16
  Administered 2015-01-15 – 2015-01-16 (×2): via INTRAVENOUS
  Filled 2015-01-15 (×3): qty 1000

## 2015-01-15 MED ORDER — DEXAMETHASONE SODIUM PHOSPHATE 10 MG/ML IJ SOLN
10.0000 mg | Freq: Once | INTRAMUSCULAR | Status: AC
  Administered 2015-01-16: 10 mg via INTRAVENOUS
  Filled 2015-01-15: qty 1

## 2015-01-15 MED ORDER — DOCUSATE SODIUM 100 MG PO CAPS
100.0000 mg | ORAL_CAPSULE | Freq: Two times a day (BID) | ORAL | Status: DC
  Administered 2015-01-15 – 2015-01-18 (×7): 100 mg via ORAL

## 2015-01-15 MED ORDER — OXYCODONE-ACETAMINOPHEN 5-325 MG PO TABS: 1.0000 | ORAL_TABLET | ORAL | Status: DC | PRN

## 2015-01-15 SURGICAL SUPPLY — 58 items
BAG DECANTER FOR FLEXI CONT (MISCELLANEOUS) IMPLANT
BAG SPEC THK2 15X12 ZIP CLS (MISCELLANEOUS) ×2
BAG ZIPLOCK 12X15 (MISCELLANEOUS) ×6 IMPLANT
BANDAGE ELASTIC 4 VELCRO ST LF (GAUZE/BANDAGES/DRESSINGS) ×3 IMPLANT
BANDAGE ELASTIC 6 VELCRO ST LF (GAUZE/BANDAGES/DRESSINGS) ×3 IMPLANT
BLADE SAG 18X100X1.27 (BLADE) ×3 IMPLANT
BLADE SAW SGTL 13.0X1.19X90.0M (BLADE) ×3 IMPLANT
BONE CEMENT GENTAMICIN (Cement) ×6 IMPLANT
CAP KNEE TOTAL 3 SIGMA ×2 IMPLANT
CEMENT BONE GENTAMICIN 40 (Cement) IMPLANT
CLOTH BEACON ORANGE TIMEOUT ST (SAFETY) ×3 IMPLANT
CUFF TOURN SGL QUICK 34 (TOURNIQUET CUFF) ×3
CUFF TRNQT CYL 34X4X40X1 (TOURNIQUET CUFF) ×1 IMPLANT
DECANTER SPIKE VIAL GLASS SM (MISCELLANEOUS) ×3 IMPLANT
DRAPE U-SHAPE 47X51 STRL (DRAPES) ×3 IMPLANT
DRSG AQUACEL AG ADV 3.5X10 (GAUZE/BANDAGES/DRESSINGS) ×3 IMPLANT
DRSG TEGADERM 4X4.75 (GAUZE/BANDAGES/DRESSINGS) ×3 IMPLANT
DURAPREP 26ML APPLICATOR (WOUND CARE) ×6 IMPLANT
ELECT REM PT RETURN 9FT ADLT (ELECTROSURGICAL) ×3
ELECTRODE REM PT RTRN 9FT ADLT (ELECTROSURGICAL) ×1 IMPLANT
EVACUATOR 1/8 PVC DRAIN (DRAIN) ×3 IMPLANT
GAUZE SPONGE 2X2 8PLY STRL LF (GAUZE/BANDAGES/DRESSINGS) ×1 IMPLANT
GLOVE BIOGEL PI IND STRL 8 (GLOVE) ×2 IMPLANT
GLOVE BIOGEL PI INDICATOR 8 (GLOVE) ×4
GLOVE SURG ORTHO 8.0 STRL STRW (GLOVE) ×3 IMPLANT
GLOVE SURG ORTHO 9.0 STRL STRW (GLOVE) ×3 IMPLANT
GLOVE SURG SS PI 7.5 STRL IVOR (GLOVE) ×3 IMPLANT
GOWN STRL REUS W/TWL XL LVL3 (GOWN DISPOSABLE) ×6 IMPLANT
HANDPIECE INTERPULSE COAX TIP (DISPOSABLE) ×3
IMMOBILIZER KNEE 20 (SOFTGOODS) ×3
IMMOBILIZER KNEE 20 THIGH 36 (SOFTGOODS) ×1 IMPLANT
LIQUID BAND (GAUZE/BANDAGES/DRESSINGS) ×2 IMPLANT
NS IRRIG 1000ML POUR BTL (IV SOLUTION) ×3 IMPLANT
PACK TOTAL KNEE CUSTOM (KITS) ×3 IMPLANT
POSITIONER SURGICAL ARM (MISCELLANEOUS) ×3 IMPLANT
SET HNDPC FAN SPRY TIP SCT (DISPOSABLE) ×1 IMPLANT
SET PAD KNEE POSITIONER (MISCELLANEOUS) ×3 IMPLANT
SPONGE GAUZE 2X2 STER 10/PKG (GAUZE/BANDAGES/DRESSINGS) ×2
SPONGE LAP 18X18 X RAY DECT (DISPOSABLE) IMPLANT
SPONGE SURGIFOAM ABS GEL 100 (HEMOSTASIS) ×3 IMPLANT
STOCKINETTE 6  STRL (DRAPES) ×2
STOCKINETTE 6 STRL (DRAPES) ×1 IMPLANT
SUCTION FRAZIER 12FR DISP (SUCTIONS) ×3 IMPLANT
SUT BONE WAX W31G (SUTURE) IMPLANT
SUT MNCRL AB 3-0 PS2 18 (SUTURE) ×3 IMPLANT
SUT VIC AB 1 CT1 27 (SUTURE) ×12
SUT VIC AB 1 CT1 27XBRD ANTBC (SUTURE) ×4 IMPLANT
SUT VIC AB 2-0 CT1 27 (SUTURE) ×9
SUT VIC AB 2-0 CT1 TAPERPNT 27 (SUTURE) ×2 IMPLANT
SUT VLOC 180 0 24IN GS25 (SUTURE) ×3 IMPLANT
SYR 50ML LL SCALE MARK (SYRINGE) ×3 IMPLANT
TAPE STRIPS DRAPE STRL (GAUZE/BANDAGES/DRESSINGS) ×3 IMPLANT
TOWER CARTRIDGE SMART MIX (DISPOSABLE) ×3 IMPLANT
TRAY FOLEY W/METER SILVER 14FR (SET/KITS/TRAYS/PACK) ×3 IMPLANT
TRAY FOLEY W/METER SILVER 16FR (SET/KITS/TRAYS/PACK) ×1 IMPLANT
WATER STERILE IRR 1500ML POUR (IV SOLUTION) ×6 IMPLANT
WRAP KNEE MAXI GEL POST OP (GAUZE/BANDAGES/DRESSINGS) ×3 IMPLANT
YANKAUER SUCT BULB TIP 10FT TU (MISCELLANEOUS) ×3 IMPLANT

## 2015-01-15 NOTE — Progress Notes (Signed)
DNR sheet placed on front of chart

## 2015-01-15 NOTE — Anesthesia Procedure Notes (Signed)
Spinal Patient location during procedure: OR Start time: 01/15/2015 7:45 AM End time: 01/15/2015 8:00 AM Staffing Anesthesiologist: Phillips GroutARIGNAN, PETER Performed by: anesthesiologist  Preanesthetic Checklist Completed: patient identified, site marked, surgical consent, pre-op evaluation, timeout performed, IV checked, risks and benefits discussed and monitors and equipment checked Spinal Block Patient position: sitting Prep: Betadine, site prepped and draped and alcohol swabs Patient monitoring: heart rate Approach: left paramedian Location: L2-3 Injection technique: single-shot Needle Needle type: Spinocan  Needle gauge: 22 G Needle length: 9 cm

## 2015-01-15 NOTE — Anesthesia Preprocedure Evaluation (Addendum)
Anesthesia Evaluation  Patient identified by MRN, date of birth, ID band Patient awake    Reviewed: Allergy & Precautions, NPO status , Patient's Chart, lab work & pertinent test results  Airway Mallampati: II  TM Distance: >3 FB Neck ROM: Full    Dental no notable dental hx. (+) Poor Dentition   Pulmonary neg pulmonary ROS,    Pulmonary exam normal breath sounds clear to auscultation       Cardiovascular hypertension, Pt. on medications Normal cardiovascular exam Rhythm:Regular Rate:Normal     Neuro/Psych CVA, Residual Symptoms negative psych ROS   GI/Hepatic negative GI ROS, Neg liver ROS,   Endo/Other  negative endocrine ROSdiabetes  Renal/GU negative Renal ROS  negative genitourinary   Musculoskeletal negative musculoskeletal ROS (+)   Abdominal   Peds negative pediatric ROS (+)  Hematology negative hematology ROS (+)   Anesthesia Other Findings   Reproductive/Obstetrics negative OB ROS                            Anesthesia Physical Anesthesia Plan  ASA: II  Anesthesia Plan: Spinal   Post-op Pain Management:    Induction:   Airway Management Planned: Simple Face Mask  Additional Equipment:   Intra-op Plan:   Post-operative Plan:   Informed Consent: I have reviewed the patients History and Physical, chart, labs and discussed the procedure including the risks, benefits and alternatives for the proposed anesthesia with the patient or authorized representative who has indicated his/her understanding and acceptance.   Dental advisory given  Plan Discussed with: CRNA  Anesthesia Plan Comments:         Anesthesia Quick Evaluation

## 2015-01-15 NOTE — Plan of Care (Signed)
Problem: Consults Goal: Diagnosis- Total Joint Replacement Primary Total Knee     

## 2015-01-15 NOTE — Transfer of Care (Signed)
Immediate Anesthesia Transfer of Care Note  Patient: Lisa [name]  Procedure(s) Performed: Procedure(s): LEFT TOTAL KNEE ARTHROPLASTY (Left)  Patient Location: PACU  Anesthesia Type:Spinal  Level of Consciousness: awake, alert , oriented and patient cooperative  Airway & Oxygen Therapy: Patient Spontanous Breathing and Patient connected to face mask oxygen  Post-op Assessment: Report given to RN, Post -op Vital signs reviewed and stable and Patient moving all extremities  Post vital signs: Reviewed and stable  Last Vitals:  Filed Vitals:   01/15/15 0546  BP: 178/82  Pulse: 72  Temp: 36.4 C  Resp: 18    Complications: No apparent anesthesia complications

## 2015-01-15 NOTE — Op Note (Signed)
DATE OF SURGERY:  01/15/2015  TIME: 9:53 AM  PATIENT NAME:  Lisa [name]    AGE: 81 y.o.   PRE-OPERATIVE DIAGNOSIS:  left knee osteoarthritis  POST-OPERATIVE DIAGNOSIS:  left knee osteoarthritis  PROCEDURE:  Procedure(s): LEFT TOTAL KNEE ARTHROPLASTY  SURGEON:  Mariadelosang Wynns ANDREW  ASSISTANT:  Bryson Stilwell, PA-C, present and scrubbed throughout the case, critical for assistance with exposure, retraction, instrumentation, and closure.  OPERATIVE IMPLANTS: Depuy PFC Sigma Rotating Platform.  Femur size 5, Tibia size 4, Patella size 32 3-peg oval button, with a 10 mm polyethylene insert.   PREOPERATIVE INDICATIONS:   Lisa [name] is a 81 y.o. year old female with end stage bone on bone arthritis of the knee who failed conservative treatment and elected for Total Knee Arthroplasty.   The risks, benefits, and alternatives were discussed at length including but not limited to the risks of infection, bleeding, nerve injury, stiffness, blood clots, the need for revision surgery, cardiopulmonary complications, among others, and they were willing to proceed.  OPERATIVE DESCRIPTION:  The patient was brought to the operative room and placed in a supine position.  Spinal anesthesia was administered.  IV antibiotics were given.  The lower extremity was prepped and draped in the usual sterile fashion.  Time out was performed.  The leg was elevated and exsanguinated and the tourniquet was inflated.  Anterior quadriceps tendon splitting approach was performed.  The patella was retracted and osteophytes were removed.  The anterior horn of the medial and lateral meniscus was removed and cruciate ligaments resected.   The distal femur was opened with the drill and the intramedullary distal femoral cutting jig was utilized, set at 5 degrees resecting 10 mm off the distal femur.  Care was taken to protect the collateral ligaments.  The distal femoral sizing jig was applied, taking  care to avoid notching.  Then the 4-in-1 cutting jig was applied and the anterior and posterior femur was cut, along with the chamfer cuts.    Then the extramedullary tibial cutting jig was utilized making the appropriate cut using the anterior tibial crest as a reference building in appropriate posterior slope.  Care was taken during the cut to protect the medial and collateral ligaments.  The proximal tibia was removed along with the posterior horns of the menisci.   The posterior medial femoral osteophytes and posterior lateral femoral osteophytes were removed.    The flexion gap was then measured and was symmetric with the extension gap, measured at 10.  I completed the distal femoral preparation using the appropriate jig to prepare the box.  The patella was then measured, and cut with the saw.    The proximal tibia sized and prepared accordingly with the reamer and the punch, and then all components were trialed with the trial insert.  The knee was found to have excellent balance and full motion.    The above named components were then cemented into place and all excess cement was removed.  The trial polyethylene component was in place during cementation, and then was exchanged for the real polyethylene component.    The knee was easily taken through a range of motion and the patella tracked well and the knee irrigated copiously and the parapatellar and subcutaneous tissue closed with vicryl, and monocryl with steri strips for the skin.  The arthrotomy was closed at 90 of flexion. The wounds were dressed with sterile gauze and the tourniquet released and the patient was awakened and returned to the PACU  in stable and satisfactory condition.  There were no complications.  Total tourniquet time was 90 minutes.

## 2015-01-15 NOTE — Anesthesia Postprocedure Evaluation (Signed)
  Anesthesia Post-op Note  Patient: Lisa [name]  Procedure(s) Performed: Procedure(s) (LRB): LEFT TOTAL KNEE ARTHROPLASTY (Left)  Patient Location: PACU  Anesthesia Type: Spinal  Level of Consciousness: awake and alert   Airway and Oxygen Therapy: Patient Spontanous Breathing  Post-op Pain: mild  Post-op Assessment: Post-op Vital signs reviewed, Patient's Cardiovascular Status Stable, Respiratory Function Stable, Patent Airway and No signs of Nausea or vomiting  Last Vitals:  Filed Vitals:   01/15/15 1208  BP: 177/62  Pulse: 58  Temp: 36.3 C  Resp: 16    Post-op Vital Signs: stable   Complications: No apparent anesthesia complications

## 2015-01-15 NOTE — Progress Notes (Signed)
Utilization review completed.  

## 2015-01-16 LAB — BASIC METABOLIC PANEL
ANION GAP: 4 — AB (ref 5–15)
BUN: 31 mg/dL — ABNORMAL HIGH (ref 6–20)
CALCIUM: 8.1 mg/dL — AB (ref 8.9–10.3)
CO2: 27 mmol/L (ref 22–32)
CREATININE: 1.07 mg/dL — AB (ref 0.44–1.00)
Chloride: 110 mmol/L (ref 101–111)
GFR, EST AFRICAN AMERICAN: 51 mL/min — AB (ref >60)
GFR, EST NON AFRICAN AMERICAN: 44 mL/min — AB (ref >60)
Glucose, Bld: 133 mg/dL — ABNORMAL HIGH (ref 65–99)
Potassium: 4.7 mmol/L (ref 3.5–5.1)
SODIUM: 141 mmol/L (ref 135–145)

## 2015-01-16 LAB — CBC
HCT: 29.8 % — ABNORMAL LOW (ref 36.0–46.0)
Hemoglobin: 9.7 g/dL — ABNORMAL LOW (ref 12.0–15.0)
MCH: 29.5 pg (ref 26.0–34.0)
MCHC: 32.6 g/dL (ref 30.0–36.0)
MCV: 90.6 fL (ref 78.0–100.0)
PLATELETS: 156 10*3/uL (ref 150–400)
RBC: 3.29 MIL/uL — ABNORMAL LOW (ref 3.87–5.11)
RDW: 13.1 % (ref 11.5–15.5)
WBC: 12.6 10*3/uL — AB (ref 4.0–10.5)

## 2015-01-16 LAB — GLUCOSE, CAPILLARY: Glucose-Capillary: 126 mg/dL — ABNORMAL HIGH (ref 65–99)

## 2015-01-16 MED ORDER — SODIUM CHLORIDE 0.9 % IV BOLUS (SEPSIS)
250.0000 mL | Freq: Once | INTRAVENOUS | Status: AC
  Administered 2015-01-16: 250 mL via INTRAVENOUS

## 2015-01-16 MED ORDER — SODIUM CHLORIDE 0.9 % IV SOLN
INTRAVENOUS | Status: DC
  Administered 2015-01-16 – 2015-01-17 (×2): via INTRAVENOUS

## 2015-01-16 NOTE — Progress Notes (Signed)
Physical Therapy Treatment Patient Details Name: Lisa GermanyKathleen N Goller MRN: [id number] DOB: 01/17/1922 Today's Date: 79/29/2016    History of Present Illness 81 yo female s/p L TKA 01/15/15. Hx of CVA with R sided weakness.     PT Comments    Progressing well with mobility  Follow Up Recommendations  SNF     Equipment Recommendations  None recommended by PT    Recommendations for Other Services       Precautions / Restrictions Precautions Precautions: Fall Required Braces or Orthoses: Knee Immobilizer - Left Restrictions Weight Bearing Restrictions: No LLE Weight Bearing: Weight bearing as tolerated    Mobility  Bed Mobility Overal bed mobility: Needs Assistance Bed Mobility: Supine to Sit;Sit to Supine     Supine to sit: Min assist Sit to supine: Min assist   General bed mobility comments: Assist for L LE  Transfers Overall transfer level: Needs assistance Equipment used: Rolling walker (2 wheeled) Transfers: Sit to/from Stand Sit to Stand: Min assist        General transfer comment: Small amount of assist to rise, control descent. VCs safety, technique,hand placement  Ambulation/Gait Ambulation/Gait assistance: Min guard Ambulation Distance (Feet): 100 Feet Assistive device: Rolling walker (2 wheeled) Gait Pattern/deviations: Step-to pattern     General Gait Details: close guard for safety.    Stairs            Wheelchair Mobility    Modified Rankin (Stroke Patients Only)       Balance                                    Cognition Arousal/Alertness: Awake/alert Behavior During Therapy: WFL for tasks assessed/performed Overall Cognitive Status: Within Functional Limits for tasks assessed                      Exercises Total Joint Exercises Ankle Circles/Pumps: AROM;Both;10 reps;Supine Quad Sets: AROM;Both;10 reps;Supine Heel Slides: AAROM;Left;10 reps;Supine Hip ABduction/ADduction: AAROM;Left;10  reps;Supine Straight Leg Raises: AAROM;Left;10 reps;Supine Goniometric ROM: ~10-50 degrees    General Comments        Pertinent Vitals/Pain Pain Assessment: 0-10 Pain Score: 6  Pain Location: L knee Pain Descriptors / Indicators: Aching;Sore Pain Intervention(s): Monitored during session;Ice applied;Repositioned    Home Living Family/patient expects to be discharged to:: Skilled nursing facility Living Arrangements: Alone                  Prior Function Level of Independence: Independent with assistive device(s)      Comments: using cane    PT Goals (current goals can now be found in the care plan section) Acute Rehab PT Goals Patient Stated Goal: to return to independence.  PT Goal Formulation: With patient/family Time For Goal Achievement: 01/23/15 Potential to Achieve Goals: Good Progress towards PT goals: Progressing toward goals    Frequency  7X/week    PT Plan Current plan remains appropriate    Co-evaluation             End of Session Equipment Utilized During Treatment: Gait belt;Left knee immobilizer Activity Tolerance: Patient tolerated treatment well Patient left: in bed;with call bell/phone within reach;with bed alarm set;with family/visitor present     Time: 1421-1435 PT Time Calculation (min) (ACUTE ONLY): 14 min  Charges:  $Gait Training: 8-22 mins  G Codes:      Weston Anna, MPT Pager: [phone]

## 2015-01-16 NOTE — Progress Notes (Signed)
   Subjective: 1 Day Post-Op Procedure(s) (LRB): LEFT TOTAL KNEE ARTHROPLASTY (Left) Patient reports pain as 1 on 0-10 scale and mild.   Patient seen in rounds for Dr. Thomasena Edisollins. Patient is well, and has had no acute complaints or problems.  Sitting up in bed with family in room. Slightly nauseated when attempted to sit up yesterday but none this morning so far. We will start therapy today.  Plan is to go Skilled nursing facility after hospital stay.  Likely Monday.  Objective: Vital signs in last 24 hours: Temp:  [97.3 F (36.3 C)-98.3 F (36.8 C)] 98.1 F (36.7 C) (10/29 0715) Pulse Rate:  [52-75] 69 (10/29 0715) Resp:  [10-20] 18 (10/29 0715) BP: (107-177)/(45-75) 118/45 mmHg (10/29 0715) SpO2:  [93 %-100 %] 94 % (10/29 0715)  Intake/Output from previous day:  Intake/Output Summary (Last 24 hours) at 01/16/15 0759 Last data filed at 01/16/15 0700  Gross per 24 hour  Intake 3723.45 ml  Output   2020 ml  Net 1703.45 ml    Intake/Output this shift: 200 since around [place]  Labs:  Recent Labs  01/16/15 0435  HGB 9.7*    Recent Labs  01/16/15 0435  WBC 12.6*  RBC 3.29*  HCT 29.8*  PLT 156    Recent Labs  01/16/15 0435  NA 141  K 4.7  CL 110  CO2 27  BUN 31*  CREATININE 1.07*  GLUCOSE 133*  CALCIUM 8.1*   No results for input(s): LABPT, INR in the last 72 hours.  EXAM General - Patient is Alert, Appropriate and Oriented Extremity - Neurovascular intact Sensation intact distally Dressing - dressing C/D/I Motor Function - intact, moving foot and toes well on exam.  Hemovac pulled without difficulty.  Past Medical History  Diagnosis Date  . Unspecified vitamin D deficiency   . Hyperlipidemia LDL goal < 100   . Dermatophytosis of groin and perianal area   . Cervicalgia   . Insomnia, unspecified   . Chronic kidney disease, stage III (moderate)   . Other abnormal glucose   . Other B-complex deficiencies   . Other malaise and fatigue   . Senile  cataract, unspecified   . Unspecified essential hypertension   . Muscle weakness (generalized)   . Tachycardia, unspecified   . Palpitations   . Undiagnosed cardiac murmurs   . Shortness of breath   . Stroke [facility](HCC)     small "stroke" - right sided weakness for a day.  . Type II or unspecified type diabetes mellitus with renal manifestations, not stated as uncontrolled     "prediabetes"-no meds.  . Osteoarthrosis, unspecified whether generalized or localized, lower leg     osteoarthritis - upper thoracic area, left knee.    Assessment/Plan: 1 Day Post-Op Procedure(s) (LRB): LEFT TOTAL KNEE ARTHROPLASTY (Left) Active Problems:   Primary osteoarthritis of left knee   S/P knee replacement  Estimated body mass index is 24.64 kg/(m^2) as calculated from the following:   Height as of this encounter: 5\' 7"  (1.702 m).   Weight as of this encounter: 71.385 kg (157 lb 6 oz). Advance diet Up with therapy Discharge to SNF  DVT Prophylaxis - Lovenox Weight-Bearing as tolerated to left leg D/C O2 and Pulse OX and try on Room Air  Avel Peacerew Akasha Melena, PA-C Orthopaedic Surgery 01/16/2015, 7:59 AM

## 2015-01-16 NOTE — Evaluation (Signed)
Physical Therapy Evaluation Patient Details Name: Lisa GermanyKathleen N [name] MRN: [id number] DOB: 09/29/1921 Today's Date: 01/16/2015   History of Present Illness  81 yo female s/p L TKA 01/15/15. Hx of CVA with R sided weakness.   Clinical Impression  On eval, pt required Min assist for mobility-performed stand pivot from bed to recliner with RW. Limited by nausea during this session-made Rn aware. Recommend ST rehab at SNF    Follow Up Recommendations SNF    Equipment Recommendations  None recommended by PT    Recommendations for Other Services       Precautions / Restrictions Precautions Precautions: Fall Required Braces or Orthoses: Knee Immobilizer - Left Restrictions Weight Bearing Restrictions: No LLE Weight Bearing: Weight bearing as tolerated      Mobility  Bed Mobility Overal bed mobility: Needs Assistance Bed Mobility: Supine to Sit     Supine to sit: HOB elevated;Min assist     General bed mobility comments: Assist for L LE.   Transfers Overall transfer level: Needs assistance Equipment used: Rolling walker (2 wheeled) Transfers: Sit to/from UGI CorporationStand;Stand Pivot Transfers Sit to Stand: Min assist Stand pivot transfers: Min assist       General transfer comment: VCs safety, technique, hand/LE placement. Assist to rise, stabilize, control descent. Stand pivot from bed to recliner with RW  Ambulation/Gait             General Gait Details: unable to attempt due to nausea, low batt IV pole  Stairs            Wheelchair Mobility    Modified Rankin (Stroke Patients Only)       Balance                                             Pertinent Vitals/Pain Pain Assessment: 0-10 Pain Score: 6  Pain Location: L knee Pain Descriptors / Indicators: Aching;Sore Pain Intervention(s): Monitored during session;Ice applied;Repositioned    Home Living Family/patient expects to be discharged to:: Skilled nursing facility Living  Arrangements: Alone                    Prior Function Level of Independence: Independent with assistive device(s)         Comments: using cane      Hand Dominance        Extremity/Trunk Assessment   Upper Extremity Assessment: Defer to OT evaluation           Lower Extremity Assessment: RLE deficits/detail;LLE deficits/detail RLE Deficits / Details: generalized weakness/some residual weakness from previous CVA LLE Deficits / Details: hip flex 3-/5. moves ankle well  Cervical / Trunk Assessment: Normal  Communication   Communication: No difficulties  Cognition Arousal/Alertness: Awake/alert Behavior During Therapy: WFL for tasks assessed/performed Overall Cognitive Status: Within Functional Limits for tasks assessed                      General Comments      Exercises Total Joint Exercises Ankle Circles/Pumps: AROM;Both;10 reps;Supine Quad Sets: AROM;Both;10 reps;Supine Heel Slides: AAROM;Left;10 reps;Supine Hip ABduction/ADduction: AAROM;Left;10 reps;Supine Straight Leg Raises: AAROM;Left;10 reps;Supine Goniometric ROM: ~10-50 degrees      Assessment/Plan    PT Assessment Patient needs continued PT services  PT Diagnosis Difficulty walking;Acute pain   PT Problem List Decreased strength;Decreased range of motion;Decreased activity tolerance;Decreased balance;Decreased mobility;Decreased knowledge of use of  DME;Pain  PT Treatment Interventions DME instruction;Gait training;Functional mobility training;Therapeutic activities;Patient/family education;Balance training;Therapeutic exercise   PT Goals (Current goals can be found in the Care Plan section) Acute Rehab PT Goals Patient Stated Goal: to get better, walk. PT Goal Formulation: With patient/family Time For Goal Achievement: 01/23/15 Potential to Achieve Goals: Good    Frequency 7X/week   Barriers to discharge        Co-evaluation               End of Session Equipment  Utilized During Treatment: Gait belt;Left knee immobilizer Activity Tolerance: Patient tolerated treatment well (limited by nausea) Patient left: in chair;with call bell/phone within reach;with family/visitor present;with chair alarm set           Time: 1610-9604 PT Time Calculation (min) (ACUTE ONLY): 20 min   Charges:   PT Evaluation $Initial PT Evaluation Tier I: 1 Procedure     PT G Codes:        Rebeca Alert, MPT Pager: [phone]

## 2015-01-16 NOTE — Evaluation (Signed)
Occupational Therapy Evaluation Patient Details Name: Lisa SouKathleen N Biggar MRN: [id number] DOB: 09/01/1921 Today's Date: 01/16/2015    History of Present Illness 81 yo female s/p L TKA 01/15/15. Hx of CVA with R sided weakness.    Clinical Impression   Pt doing well. Pt nauseous earlier but stated she felt much better during OT session. Pt tolerated sponge bath at sit to stand level. Pt planning SNF. Will follow on acute to progress ADL independence.     Follow Up Recommendations  SNF    Equipment Recommendations  Other (comment) (defer to next venue)    Recommendations for Other Services       Precautions / Restrictions Precautions Precautions: Fall Required Braces or Orthoses: Knee Immobilizer - Left Restrictions Weight Bearing Restrictions: No LLE Weight Bearing: Weight bearing as tolerated      Mobility Bed Mobility      General bed mobility comments: pt in chair.   Transfers Overall transfer level: Needs assistance Equipment used: Rolling walker (2 wheeled) Transfers: Sit to/from Stand Sit to Stand: Min assist Stand pivot transfers: Min assist       General transfer comment: min verbal cues for hand placement and LE manageement.     Balance                                            ADL Overall ADL's : Needs assistance/impaired Eating/Feeding: Independent;Sitting   Grooming: Wash/dry hands;Wash/dry face;Set up;Sitting   Upper Body Bathing: Set up;Sitting   Lower Body Bathing: Moderate assistance;Sit to/from stand   Upper Body Dressing : Set up;Sitting   Lower Body Dressing: Moderate assistance;Sit to/from stand   Toilet Transfer: Minimal Production designer, theatre/television/filmassistance;RW Toilet Transfer Details (indicate cue type and reason): sit to stand. Toileting- Clothing Manipulation and Hygiene: Minimal assistance;Sit to/from stand         General ADL Comments: Pt stood to wash periareas with min assist for balance support. Pt planning SNF rehab at  d/c. Discussed KI use and when to wear.      Vision     Perception     Praxis      Pertinent Vitals/Pain Pain Assessment: 0-10 Pain Score: 6  Pain Location: L knee Pain Descriptors / Indicators: Aching;Sore Pain Intervention(s): Monitored during session;Ice applied;Repositioned     Hand Dominance     Extremity/Trunk Assessment Upper Extremity Assessment Upper Extremity Assessment: Overall WFL for tasks assessed   l   Cervical / Trunk Assessment Cervical / Trunk Assessment: Normal   Communication Communication Communication: No difficulties   Cognition Arousal/Alertness: Awake/alert Behavior During Therapy: WFL for tasks assessed/performed Overall Cognitive Status: Within Functional Limits for tasks assessed                     General Comments       Exercises       Shoulder Instructions      Home Living Family/patient expects to be discharged to:: Skilled nursing facility Living Arrangements: Alone                                      Prior Functioning/Environment Level of Independence: Independent with assistive device(s)        Comments: using cane     OT Diagnosis: Generalized weakness   OT Problem List: Decreased strength;Decreased  knowledge of use of DME or AE   OT Treatment/Interventions: Self-care/ADL training;Patient/family education;Therapeutic activities;DME and/or AE instruction    OT Goals(Current goals can be found in the care plan section) Acute Rehab OT Goals Patient Stated Goal: to return to independence.  OT Goal Formulation: With patient Time For Goal Achievement: 01/23/15 Potential to Achieve Goals: Good  OT Frequency: Min 2X/week   Barriers to D/C:            Co-evaluation              End of Session Equipment Utilized During Treatment: Left knee immobilizer  Activity Tolerance: Patient tolerated treatment well Patient left: in chair;with call bell/phone within reach;with chair alarm set    Time: 0981-1914 OT Time Calculation (min): 29 min Charges:  OT General Charges $OT Visit: 1 Procedure OT Evaluation $Initial OT Evaluation Tier I: 1 Procedure OT Treatments $Self Care/Home Management : 8-22 mins G-Codes:    Lennox Laity  [phone] 01/16/2015, 12:40 PM

## 2015-01-17 LAB — CBC
HCT: 27.7 % — ABNORMAL LOW (ref 36.0–46.0)
Hemoglobin: 9 g/dL — ABNORMAL LOW (ref 12.0–15.0)
MCH: 30 pg (ref 26.0–34.0)
MCHC: 32.5 g/dL (ref 30.0–36.0)
MCV: 92.3 fL (ref 78.0–100.0)
PLATELETS: 136 10*3/uL — AB (ref 150–400)
RBC: 3 MIL/uL — AB (ref 3.87–5.11)
RDW: 13.4 % (ref 11.5–15.5)
WBC: 8.9 10*3/uL (ref 4.0–10.5)

## 2015-01-17 LAB — GLUCOSE, CAPILLARY: GLUCOSE-CAPILLARY: 112 mg/dL — AB (ref 65–99)

## 2015-01-17 MED ORDER — HYDROMORPHONE HCL 2 MG PO TABS: 2.0000 mg | ORAL_TABLET | ORAL | Status: DC | PRN

## 2015-01-17 MED ORDER — OXYCODONE HCL 5 MG PO TABS
5.0000 mg | ORAL_TABLET | ORAL | Status: DC | PRN
  Administered 2015-01-17 (×6): 5 mg via ORAL
  Administered 2015-01-18 (×3): 10 mg via ORAL
  Filled 2015-01-17 (×3): qty 1
  Filled 2015-01-17: qty 2
  Filled 2015-01-17: qty 1
  Filled 2015-01-17: qty 2
  Filled 2015-01-17: qty 1
  Filled 2015-01-17: qty 2
  Filled 2015-01-17: qty 1

## 2015-01-17 NOTE — NC FL2 (Deleted)
[place] MEDICAID FL2 LEVEL OF CARE SCREENING TOOL     IDENTIFICATION  Patient Name: Lisa [name] Birthdate: 11/17/1921 Sex: female Admission Date (Current Location): 01/15/2015  [facility] [place]   Fac[facility] and Address:  [facility],  [address], [place] [id number]      Provider Number: [id number]  Attending Physician Name and Address:  Eugenia Mcalpineobert Collins, MD  Relative Name and Phone Number:       Current Level of Care: Hospital Recommended Level of Care: Skilled Nursing Facility Prior Approval Number:    Date Approved/Denied:   PASRR Number:    Discharge Plan: SNF    Current Diagnoses: Patient Active Problem List   Diagnosis Date Noted  . Primary osteoarthritis of left knee 01/15/2015  . S/P knee replacement 01/15/2015  . Dry skin 01/28/2014  . HLD (hyperlipidemia) 01/06/2014  . Essential hypertension 01/06/2014  . B12 deficiency 09/29/2013  . Mixed hyperlipidemia 09/29/2013  . Hypertension, renal disease 06/26/2013  . Seasonal allergies 06/26/2013  . CVA (cerebral vascular accident) (HCC) 04/09/2013  . Stroke (HCC) 04/09/2013  . Insomnia 09/14/2012  . Hyperlipidemia LDL goal < 100   . Osteoarthrosis, unspecified whether generalized or localized, lower leg   . Undiagnosed cardiac murmurs   . Shortness of breath   . Type 2 diabetes mellitus with renal manifestations, controlled (HCC)   . Chronic kidney disease, stage III (moderate)   . Cervicalgia   . HYPERTENSION 11/30/2008  . DYSPNEA 11/30/2008  . TACHYCARDIA, HX OF 11/30/2008    Orientation ACTIVITIES/SOCIAL BLADDER RESPIRATION    Self, Time, Situation, Place    Incontinent Normal  BEHAVIORAL SYMPTOMS/MOOD NEUROLOGICAL BOWEL NUTRITION STATUS      Continent    PHYSICIAN VISITS COMMUNICATION OF NEEDS Height & Weight Skin    Verbally   157 lbs. Surgical wounds          AMBULATORY STATUS RESPIRATION    Supervision limited Normal      Personal Care  Assistance Level of Assistance  Bathing, Dressing Bathing Assistance: Limited assistance   Dressing Assistance: Limited assistance      Functional Limitations Info                SPECIAL CARE FACTORS FREQUENCY                      Additional Factors Info                  Current Medications (01/17/2015): Current Facility-Administered Medications  Medication Dose Route Frequency Provider Last Rate Last Dose  . 0.9 %  sodium chloride infusion   Intravenous Continuous Avel Peacerew Perkins, PA-C 30 mL/hr at 01/16/15 1020    . acetaminophen (TYLENOL) tablet 650 mg  650 mg Oral Q6H PRN Bryson L Stilwell, PA-C       Or  . acetaminophen (TYLENOL) suppository 650 mg  650 mg Rectal Q6H PRN Bryson L Stilwell, PA-C      . alum & mag hydroxide-simeth (MAALOX/MYLANTA) 200-200-20 MG/5ML suspension 30 mL  30 mL Oral Q4H PRN Bryson L Stilwell, PA-C   30 mL at 01/17/15 0828  . bisacodyl (DULCOLAX) EC tablet 5 mg  5 mg Oral Daily PRN Bryson L Stilwell, PA-C      . carvedilol (COREG) tablet 12.5 mg  12.5 mg Oral BID WC Bryson L Stilwell, PA-C   12.5 mg at 01/17/15 0814  . clopidogrel (PLAVIX) tablet 75 mg  75 mg Oral Q breakfast Bryson L  Stilwell, PA-C   75 mg at 01/17/15 0814  . diphenhydrAMINE (BENADRYL) 12.5 MG/5ML elixir 12.5-25 mg  12.5-25 mg Oral Q4H PRN Bryson L Stilwell, PA-C      . docusate sodium (COLACE) capsule 100 mg  100 mg Oral BID Bryson L Stilwell, PA-C   100 mg at 01/16/15 2219  . enoxaparin (LOVENOX) injection 30 mg  30 mg Subcutaneous Q12H Bryson L Stilwell, PA-C   30 mg at 01/17/15 0813  . ferrous sulfate tablet 325 mg  325 mg Oral TID PC Bryson L Stilwell, PA-C   325 mg at 01/16/15 1710  . fluticasone (FLONASE) 50 MCG/ACT nasal spray 1-2 spray  1-2 spray Each Nare Daily PRN Bryson L Stilwell, PA-C      . HYDROmorphone (DILAUDID) injection 0.5 mg  0.5 mg Intravenous Q3H PRN Bryson L Stilwell, PA-C   0.5 mg at 01/15/15 1856  . menthol-cetylpyridinium (CEPACOL) lozenge 3 mg   1 lozenge Oral PRN Bryson L Stilwell, PA-C       Or  . phenol (CHLORASEPTIC) mouth spray 1 spray  1 spray Mouth/Throat PRN Bryson L Stilwell, PA-C      . methocarbamol (ROBAXIN) tablet 500 mg  500 mg Oral Q6H PRN Bryson L Stilwell, PA-C   500 mg at 01/17/15 0709   Or  . methocarbamol (ROBAXIN) 500 mg in dextrose 5 % 50 mL IVPB  500 mg Intravenous Q6H PRN Bryson L Stilwell, PA-C   500 mg at 01/16/15 2221  . metoCLOPramide (REGLAN) tablet 5-10 mg  5-10 mg Oral Q8H PRN Bryson L Stilwell, PA-C       Or  . metoCLOPramide (REGLAN) injection 5-10 mg  5-10 mg Intravenous Q8H PRN Bryson L Stilwell, PA-C   10 mg at 01/17/15 0220  . ondansetron (ZOFRAN) tablet 4 mg  4 mg Oral Q6H PRN Bryson L Stilwell, PA-C       Or  . ondansetron (ZOFRAN) injection 4 mg  4 mg Intravenous Q6H PRN Bryson L Stilwell, PA-C   4 mg at 01/17/15 4098  . oxyCODONE (Oxy IR/ROXICODONE) immediate release tablet 5-10 mg  5-10 mg Oral Q3H PRN Avel Peace, PA-C      . polyethylene glycol (MIRALAX / GLYCOLAX) packet 17 g  17 g Oral Daily PRN Bryson L Stilwell, PA-C      . sodium phosphate (FLEET) 7-19 GM/118ML enema 1 enema  1 enema Rectal Once PRN Bryson L Stilwell, PA-C       Do not use this list as official medication orders. Please verify with discharge summary.  Discharge Medications:   Medication List    STOP taking these medications        traMADol 50 MG tablet  Commonly known as:  ULTRAM      TAKE these medications        aspirin EC 325 MG tablet  Take 1 tablet (325 mg total) by mouth 2 (two) times daily.     b complex vitamins tablet  Take 1 tablet by mouth daily.     baclofen 10 MG tablet  Commonly known as:  LIORESAL  Take 1 tablet (10 mg total) by mouth 3 (three) times daily.     carvedilol 12.5 MG tablet  Commonly known as:  COREG  Take 1 tablet (12.5 mg total) by mouth 2 (two) times daily with a meal. For blood pressure     clopidogrel 75 MG tablet  Commonly known as:  PLAVIX  Take 1 tablet (75 mg  total) by mouth  daily with breakfast.     diphenhydramine-acetaminophen 25-500 MG Tabs tablet  Commonly known as:  TYLENOL PM  Take 2 tablets by mouth at bedtime.     fluticasone 50 MCG/ACT nasal spray  Commonly known as:  FLONASE  Place 1-2 sprays into both nostrils daily as needed for allergies or rhinitis.     hydrochlorothiazide 25 MG tablet  Commonly known as:  HYDRODIURIL  Take 1 tablet (25 mg total) by mouth daily as needed. Swelling     oxyCODONE-acetaminophen 5-325 MG tablet  Commonly known as:  ROXICET  Take 1-2 tablets by mouth every 4 (four) hours as needed for severe pain.     PRESERVISION AREDS 2 Caps  Take 1 capsule by mouth 2 (two) times daily.     Vitamin D2 400 UNITS Tabs  Take 1 tablet by mouth 2 (two) times daily.        Relevant Imaging Results:  Relevant Lab Results:  Recent Labs    Additional Information Allergies: Ace Inhibitors, Simvastatin  FULL CODE  Annetta Maw, LCSW

## 2015-01-17 NOTE — [place] FL2 (Deleted)
[place] MEDICAID FL2 LEVEL OF CARE SCREENING TOOL     IDENTIFICATION  Patient Name: Lisa SouKathleen N [name] Birthdate: 11/17/1921 Sex: female Admission Date (Current Location): 01/15/2015  [facility] [place]   Facility and Address:  [facility],  [address], [place] [id number]      Provider Number: [id number]  Attending Physician Name and Address:  Eugenia Mcalpineobert Collins, MD  Relative Name and Phone Number:       Current Level of Care: Hospital Recommended Level of Care: Skilled Nursing Facility Prior Approval Number:    Date Approved/Denied:   PASRR Number:    Discharge Plan: SNF    Current Diagnoses: Patient Active Problem List   Diagnosis Date Noted  . Primary osteoarthritis of left knee 01/15/2015  . S/P knee replacement 01/15/2015  . Dry skin 01/28/2014  . HLD (hyperlipidemia) 01/06/2014  . Essential hypertension 01/06/2014  . B12 deficiency 09/29/2013  . Mixed hyperlipidemia 09/29/2013  . Hypertension, renal disease 06/26/2013  . Seasonal allergies 06/26/2013  . CVA (cerebral vascular accident) (HCC) 04/09/2013  . Stroke (HCC) 04/09/2013  . Insomnia 09/14/2012  . Hyperlipidemia LDL goal < 100   . Osteoarthrosis, unspecified whether generalized or localized, lower leg   . Undiagnosed cardiac murmurs   . Shortness of breath   . Type 2 diabetes mellitus with renal manifestations, controlled (HCC)   . Chronic kidney disease, stage III (moderate)   . Cervicalgia   . HYPERTENSION 11/30/2008  . DYSPNEA 11/30/2008  . TACHYCARDIA, HX OF 11/30/2008    Orientation ACTIVITIES/SOCIAL BLADDER RESPIRATION    Self, Time, Situation, Place    Incontinent Normal  BEHAVIORAL SYMPTOMS/MOOD NEUROLOGICAL BOWEL NUTRITION STATUS      Continent    PHYSICIAN VISITS COMMUNICATION OF NEEDS Height & Weight Skin    Verbally   157 lbs. Surgical wounds          AMBULATORY STATUS RESPIRATION    Supervision limited Normal      Personal Care  Assistance Level of Assistance  Bathing, Dressing Bathing Assistance: Limited assistance   Dressing Assistance: Limited assistance      Functional Limitations Info                SPECIAL CARE FACTORS FREQUENCY                      Additional Factors Info                  Current Medications (01/17/2015): Current Facility-Administered Medications  Medication Dose Route Frequency Provider Last Rate Last Dose  . 0.9 %  sodium chloride infusion   Intravenous Continuous Avel Peacerew Perkins, PA-C 30 mL/hr at 01/16/15 1020    . acetaminophen (TYLENOL) tablet 650 mg  650 mg Oral Q6H PRN Bryson L Stilwell, PA-C       Or  . acetaminophen (TYLENOL) suppository 650 mg  650 mg Rectal Q6H PRN Bryson L Stilwell, PA-C      . alum & mag hydroxide-simeth (MAALOX/MYLANTA) 200-200-20 MG/5ML suspension 30 mL  30 mL Oral Q4H PRN Bryson L Stilwell, PA-C   30 mL at 01/17/15 0828  . bisacodyl (DULCOLAX) EC tablet 5 mg  5 mg Oral Daily PRN Bryson L Stilwell, PA-C      . carvedilol (COREG) tablet 12.5 mg  12.5 mg Oral BID WC Bryson L Stilwell, PA-C   12.5 mg at 01/17/15 0814  . clopidogrel (PLAVIX) tablet 75 mg  75 mg Oral Q breakfast Bryson L  Stilwell, PA-C   75 mg at 01/17/15 0814  . diphenhydrAMINE (BENADRYL) 12.5 MG/5ML elixir 12.5-25 mg  12.5-25 mg Oral Q4H PRN Bryson L Stilwell, PA-C      . docusate sodium (COLACE) capsule 100 mg  100 mg Oral BID Bryson L Stilwell, PA-C   100 mg at 01/16/15 2219  . enoxaparin (LOVENOX) injection 30 mg  30 mg Subcutaneous Q12H Bryson L Stilwell, PA-C   30 mg at 01/17/15 0813  . ferrous sulfate tablet 325 mg  325 mg Oral TID PC Bryson L Stilwell, PA-C   325 mg at 01/16/15 1710  . fluticasone (FLONASE) 50 MCG/ACT nasal spray 1-2 spray  1-2 spray Each Nare Daily PRN Bryson L Stilwell, PA-C      . HYDROmorphone (DILAUDID) injection 0.5 mg  0.5 mg Intravenous Q3H PRN Bryson L Stilwell, PA-C   0.5 mg at 01/15/15 1856  . menthol-cetylpyridinium (CEPACOL) lozenge 3 mg   1 lozenge Oral PRN Bryson L Stilwell, PA-C       Or  . phenol (CHLORASEPTIC) mouth spray 1 spray  1 spray Mouth/Throat PRN Bryson L Stilwell, PA-C      . methocarbamol (ROBAXIN) tablet 500 mg  500 mg Oral Q6H PRN Bryson L Stilwell, PA-C   500 mg at 01/17/15 0709   Or  . methocarbamol (ROBAXIN) 500 mg in dextrose 5 % 50 mL IVPB  500 mg Intravenous Q6H PRN Bryson L Stilwell, PA-C   500 mg at 01/16/15 2221  . metoCLOPramide (REGLAN) tablet 5-10 mg  5-10 mg Oral Q8H PRN Bryson L Stilwell, PA-C       Or  . metoCLOPramide (REGLAN) injection 5-10 mg  5-10 mg Intravenous Q8H PRN Bryson L Stilwell, PA-C   10 mg at 01/17/15 0220  . ondansetron (ZOFRAN) tablet 4 mg  4 mg Oral Q6H PRN Bryson L Stilwell, PA-C       Or  . ondansetron (ZOFRAN) injection 4 mg  4 mg Intravenous Q6H PRN Bryson L Stilwell, PA-C   4 mg at 01/17/15 6962  . oxyCODONE (Oxy IR/ROXICODONE) immediate release tablet 5-10 mg  5-10 mg Oral Q3H PRN Avel Peace, PA-C      . polyethylene glycol (MIRALAX / GLYCOLAX) packet 17 g  17 g Oral Daily PRN Bryson L Stilwell, PA-C      . sodium phosphate (FLEET) 7-19 GM/118ML enema 1 enema  1 enema Rectal Once PRN Bryson L Stilwell, PA-C       Do not use this list as official medication orders. Please verify with discharge summary.  Discharge Medications:   Medication List    STOP taking these medications        traMADol 50 MG tablet  Commonly known as:  ULTRAM      TAKE these medications        aspirin EC 325 MG tablet  Take 1 tablet (325 mg total) by mouth 2 (two) times daily.     b complex vitamins tablet  Take 1 tablet by mouth daily.     baclofen 10 MG tablet  Commonly known as:  LIORESAL  Take 1 tablet (10 mg total) by mouth 3 (three) times daily.     carvedilol 12.5 MG tablet  Commonly known as:  COREG  Take 1 tablet (12.5 mg total) by mouth 2 (two) times daily with a meal. For blood pressure     clopidogrel 75 MG tablet  Commonly known as:  PLAVIX  Take 1 tablet (75 mg  total) by mouth  daily with breakfast.     diphenhydramine-acetaminophen 25-500 MG Tabs tablet  Commonly known as:  TYLENOL PM  Take 2 tablets by mouth at bedtime.     fluticasone 50 MCG/ACT nasal spray  Commonly known as:  FLONASE  Place 1-2 sprays into both nostrils daily as needed for allergies or rhinitis.     hydrochlorothiazide 25 MG tablet  Commonly known as:  HYDRODIURIL  Take 1 tablet (25 mg total) by mouth daily as needed. Swelling     oxyCODONE-acetaminophen 5-325 MG tablet  Commonly known as:  ROXICET  Take 1-2 tablets by mouth every 4 (four) hours as needed for severe pain.     PRESERVISION AREDS 2 Caps  Take 1 capsule by mouth 2 (two) times daily.     Vitamin D2 400 UNITS Tabs  Take 1 tablet by mouth 2 (two) times daily.        Relevant Imaging Results:  Relevant Lab Results:  Recent Labs    Additional Information    Annetta MawKujawa,Aleksey Newbern G, LCSW

## 2015-01-17 NOTE — Discharge Summary (Signed)
Physician Discharge Summary  Patient ID: Ryan Palermo Bonsignore MRN: [id number] DOB/AGE: 1921-06-18 81 y.o.  Admit date: 01/15/2015 Discharge date: 01/18/2015  Admission Diagnoses: Left knee osteoarthritis  Discharge Diagnoses:  Active Problems:   Primary osteoarthritis of left knee   S/P knee replacement   Discharged Condition: Good  Hospital Course:  Lisa [name] is a 81 y.o. who was admitted to [facility]. They were brought to the operating room on 01/15/2015 and underwent Procedure(s): LEFT TOTAL KNEE ARTHROPLASTY.  Patient tolerated the procedure well and was later transferred to the recovery room and then to the orthopaedic floor for postoperative care.  They were given PO and IV analgesics for pain control following their surgery.  They were given 24 hours of postoperative antibiotics of  Anti-infectives    Start     Dose/Rate Route Frequency Ordered Stop   01/15/15 1400  ceFAZolin (ANCEF) IVPB 1 g/50 mL premix     1 g 100 mL/hr over 30 Minutes Intravenous Every 6 hours 01/15/15 1122 01/15/15 2005   01/15/15 0617  ceFAZolin (ANCEF) IVPB 2 g/50 mL premix     2 g 100 mL/hr over 30 Minutes Intravenous On call to O.R. 01/15/15 0617 01/15/15 0730     and started on DVT prophylaxis in the form of lovenox.   PT and OT were ordered for total joint protocol.  Discharge planning consulted to help with postop disposition and equipment needs.  Patient had a good night on the evening of surgery and started to get up OOB with therapy on day one.  Hemovac drain was pulled without difficulty.  Continued to work with therapy into day two.  Dressing was with normal limits.  The patient had progressed with therapy and meeting their goals. Patient was seen in rounds and was ready to go to SNF.  Consults: N/a  Significant Diagnostic Studies: routine  Treatments: routine  Discharge Exam: Blood pressure 132/57, pulse 77, temperature 99.3 F (37.4 C), temperature source Oral, resp.  rate 17, height 5\' 7"  (1.702 m), weight 71.385 kg (157 lb 6 oz), SpO2 91 %. Alert and oriented x3. RRR, Lungs clear, BS x4. Left Calf soft and non tender. L knee dressing C/D/I. No DVT signs. No signs of infection or compartment syndrome. LLE grossly neurovascularly intact.   Disposition: SNF  Discharge Instructions    Call MD / Call 911    Complete by:  As directed   If you experience chest pain or shortness of breath, CALL 911 and be transported to the hospital emergency room.  If you develope a fever above 101 F, pus (white drainage) or increased drainage or redness at the wound, or calf pain, call your surgeon's office.     Constipation Prevention    Complete by:  As directed   Drink plenty of fluids.  Prune juice may be helpful.  You may use a stool softener, such as Colace (over the counter) 100 mg twice a day.  Use MiraLax (over the counter) for constipation as needed.     Diet - low sodium heart healthy    Complete by:  As directed      Discharge instructions    Complete by:  As directed   INSTRUCTIONS AFTER JOINT REPLACEMENT   Remove items at home which could result in a fall. This includes throw rugs or furniture in walking pathways ICE to the affected joint every three hours while awake for 30 minutes at a time, for at least the first 3-5  days, and then as needed for pain and swelling.  Continue to use ice for pain and swelling. You may notice swelling that will progress down to the foot and ankle.  This is normal after surgery.  Elevate your leg when you are not up walking on it.   Continue to use the breathing machine you got in the hospital (incentive spirometer) which will help keep your temperature down.  It is common for your temperature to cycle up and down following surgery, especially at night when you are not up moving around and exerting yourself.  The breathing machine keeps your lungs expanded and your temperature down.   DIET:  As you were doing prior to  hospitalization, we recommend a well-balanced diet.  DRESSING / WOUND CARE / SHOWERING  Keep the surgical dressing until follow up.  The dressing is water proof, so you can shower without any extra covering.  IF THE DRESSING FALLS OFF or the wound gets wet inside, change the dressing with sterile gauze.  Please use good hand washing techniques before changing the dressing.  Do not use any lotions or creams on the incision until instructed by your surgeon.    ACTIVITY  Increase activity slowly as tolerated, but follow the weight bearing instructions below.   No driving for 6 weeks or until further direction given by your physician.  You cannot drive while taking narcotics.  No lifting or carrying greater than 10 lbs. until further directed by your surgeon. Avoid periods of inactivity such as sitting longer than an hour when not asleep. This helps prevent blood clots.  You may return to work once you are authorized by your doctor.     WEIGHT BEARING   Weight bearing as tolerated with assist device (walker, cane, etc) as directed, use it as long as suggested by your surgeon or therapist, typically at least 4-6 weeks.   EXERCISES  Results after joint replacement surgery are often greatly improved when you follow the exercise, range of motion and muscle strengthening exercises prescribed by your doctor. Safety measures are also important to protect the joint from further injury. Any time any of these exercises cause you to have increased pain or swelling, decrease what you are doing until you are comfortable again and then slowly increase them. If you have problems or questions, call your caregiver or physical therapist for advice.   Rehabilitation is important following a joint replacement. After just a few days of immobilization, the muscles of the leg can become weakened and shrink (atrophy).  These exercises are designed to build up the tone and strength of the thigh and leg muscles and to  improve motion. Often times heat used for twenty to thirty minutes before working out will loosen up your tissues and help with improving the range of motion but do not use heat for the first two weeks following surgery (sometimes heat can increase post-operative swelling).   These exercises can be done on a training (exercise) mat, on the floor, on a table or on a bed. Use whatever works the best and is most comfortable for you.    Use music or television while you are exercising so that the exercises are a pleasant break in your day. This will make your life better with the exercises acting as a break in your routine that you can look forward to.   Perform all exercises about fifteen times, three times per day or as directed.  You should exercise both the operative leg  and the other leg as well.   Exercises include:   Quad Sets - Tighten up the muscle on the front of the thigh (Quad) and hold for 5-10 seconds.   Straight Leg Raises - With your knee straight (if you were given a brace, keep it on), lift the leg to 60 degrees, hold for 3 seconds, and slowly lower the leg.  Perform this exercise against resistance later as your leg gets stronger.  Leg Slides: Lying on your back, slowly slide your foot toward your buttocks, bending your knee up off the floor (only go as far as is comfortable). Then slowly slide your foot back down until your leg is flat on the floor again.  Angel Wings: Lying on your back spread your legs to the side as far apart as you can without causing discomfort.  Hamstring Strength:  Lying on your back, push your heel against the floor with your leg straight by tightening up the muscles of your buttocks.  Repeat, but this time bend your knee to a comfortable angle, and push your heel against the floor.  You may put a pillow under the heel to make it more comfortable if necessary.   A rehabilitation program following joint replacement surgery can speed recovery and prevent re-injury  in the future due to weakened muscles. Contact your doctor or a physical therapist for more information on knee rehabilitation.    CONSTIPATION  Constipation is defined medically as fewer than three stools per week and severe constipation as less than one stool per week.  Even if you have a regular bowel pattern at home, your normal regimen is likely to be disrupted due to multiple reasons following surgery.  Combination of anesthesia, postoperative narcotics, change in appetite and fluid intake all can affect your bowels.   YOU MUST use at least one of the following options; they are listed in order of increasing strength to get the job done.  They are all available over the counter, and you may need to use some, POSSIBLY even all of these options:    Drink plenty of fluids (prune juice may be helpful) and high fiber foods Colace 100 mg by mouth twice a day  Senokot for constipation as directed and as needed Dulcolax (bisacodyl), take with full glass of water  Miralax (polyethylene glycol) once or twice a day as needed.  If you have tried all these things and are unable to have a bowel movement in the first 3-4 days after surgery call either your surgeon or your primary doctor.    If you experience loose stools or diarrhea, hold the medications until you stool forms back up.  If your symptoms do not get better within 1 week or if they get worse, check with your doctor.  If you experience "the worst abdominal pain ever" or develop nausea or vomiting, please contact the office immediately for further recommendations for treatment.   ITCHING:  If you experience itching with your medications, try taking only a single pain pill, or even half a pain pill at a time.  You can also use Benadryl over the counter for itching or also to help with sleep.   TED HOSE STOCKINGS:  Use stockings on both legs until for at least 2 weeks or as directed by physician office. They may be removed at night for  sleeping.  MEDICATIONS:  See your medication summary on the "After Visit Summary" that nursing will review with you.  You may have some home  medications which will be placed on hold until you complete the course of blood thinner medication.  It is important for you to complete the blood thinner medication as prescribed.  PRECAUTIONS:  If you experience chest pain or shortness of breath - call 911 immediately for transfer to the hospital emergency department.   If you develop a fever greater that 101 F, purulent drainage from wound, increased redness or drainage from wound, foul odor from the wound/dressing, or calf pain - CONTACT YOUR SURGEON.                                                   FOLLOW-UP APPOINTMENTS:  If you do not already have a post-op appointment, please call the office for an appointment to be seen by your surgeon.  Guidelines for how soon to be seen are listed in your "After Visit Summary", but are typically between 1-4 weeks after surgery.  OTHER INSTRUCTIONS:   Knee Replacement:  Do not place pillow under knee, focus on keeping the knee straight while resting. CPM instructions: 0-90 degrees, 2 hours in the morning, 2 hours in the afternoon, and 2 hours in the evening. Place foam block, curve side up under heel at all times except when in CPM or when walking.  DO NOT modify, tear, cut, or change the foam block in any way.  MAKE SURE YOU:  Understand these instructions.  Get help right away if you are not doing well or get worse.    Thank you for letting us be a part of your medical care team.  It is a privilege we respect greatly.  We hope these instructions will help you stay on track for a fast and full recovery!     Increase activity slowly as tolerated    Complete by:  As directed             Medication List    STOP taking these medications        traMADol 50 MG tablet  Commonly known as:  ULTRAM      TAKE these medications        aspirin EC 325 MG  tablet  Take 1 tablet (325 mg total) by mouth 2 (two) times daily.     b complex vitamins tablet  Take 1 tablet by mouth daily.     baclofen 10 MG tablet  Commonly known as:  LIORESAL  Take 1 tablet (10 mg total) by mouth 3 (three) times daily.     carvedilol 12.5 MG tablet  Commonly known as:  COREG  Take 1 tablet (12.5 mg total) by mouth 2 (two) times daily with a meal. For blood pressure     clopidogrel 75 MG tablet  Commonly known as:  PLAVIX  Take 1 tablet (75 mg total) by mouth daily with breakfast.     diphenhydramine-acetaminophen 25-500 MG Tabs tablet  Commonly known as:  TYLENOL PM  Take 2 tablets by mouth at bedtime.     fluticasone 50 MCG/ACT nasal spray  Commonly known as:  FLONASE  Place 1-2 sprays into both nostrils daily as needed for allergies or rhinitis.     hydrochlorothiazide 25 MG tablet  Commonly known as:  HYDRODIURIL  Take 1 tablet (25 mg total) by mouth daily as needed. Swelling     oxyCODONE-acetaminophen 5-325 MG tablet  Commonly known as:  ROXICET  Take 1-2 tablets by mouth every 4 (four) hours as needed for severe pain.     PRESERVISION AREDS 2 Caps  Take 1 capsule by mouth 2 (two) times daily.     Vitamin D2 400 UNITS Tabs  Take 1 tablet by mouth 2 (two) times daily.         SignedMarkham Jordan 01/17/2015, 9:48 PM

## 2015-01-17 NOTE — [place] FL2 (Signed)
[place] MEDICAID FL2 LEVEL OF CARE SCREENING TOOL     IDENTIFICATION  Patient Name: Lisa [name] Birthdate: 08/07/1921 Sex: female Admission Date (Current Location): 01/15/2015  [facility] [place]   Facility and Address:  [facility],  [address], [place] [id number]      Provider Number: [id number]  Attending Physician Name and Address:  Eugenia Mcalpineobert Collins, MD  Relative Name and Phone Number:       Current Level of Care: Hospital Recommended Level of Care: Skilled Nursing Facility Prior Approval Number:    Date Approved/Denied:   PASRR Number: [id number]  Discharge Plan: SNF    Current Diagnoses: Patient Active Problem List   Diagnosis Date Noted  . Primary osteoarthritis of left knee 01/15/2015  . S/P knee replacement 01/15/2015  . Dry skin 01/28/2014  . HLD (hyperlipidemia) 01/06/2014  . Essential hypertension 01/06/2014  . B12 deficiency 09/29/2013  . Mixed hyperlipidemia 09/29/2013  . Hypertension, renal disease 06/26/2013  . Seasonal allergies 06/26/2013  . CVA (cerebral vascular accident) (HCC) 04/09/2013  . Stroke (HCC) 04/09/2013  . Insomnia 09/14/2012  . Hyperlipidemia LDL goal < 100   . Osteoarthrosis, unspecified whether generalized or localized, lower leg   . Undiagnosed cardiac murmurs   . Shortness of breath   . Type 2 diabetes mellitus with renal manifestations, controlled (HCC)   . Chronic kidney disease, stage III (moderate)   . Cervicalgia   . HYPERTENSION 11/30/2008  . DYSPNEA 11/30/2008  . TACHYCARDIA, HX OF 11/30/2008    Orientation ACTIVITIES/SOCIAL BLADDER RESPIRATION    Self, Time, Situation, Place    Incontinent Normal  BEHAVIORAL SYMPTOMS/MOOD NEUROLOGICAL BOWEL NUTRITION STATUS      Continent    PHYSICIAN VISITS COMMUNICATION OF NEEDS Height & Weight Skin    Verbally   157 lbs. Surgical wounds          AMBULATORY STATUS RESPIRATION    Supervision limited Normal      Personal  Care Assistance Level of Assistance  Bathing, Dressing Bathing Assistance: Limited assistance   Dressing Assistance: Limited assistance      Functional Limitations Info                SPECIAL CARE FACTORS FREQUENCY                      Additional Factors Info                  Current Medications (01/17/2015): Current Facility-Administered Medications  Medication Dose Route Frequency Provider Last Rate Last Dose  . 0.9 %  sodium chloride infusion   Intravenous Continuous Avel Peacerew Perkins, PA-C 30 mL/hr at 01/16/15 1020    . acetaminophen (TYLENOL) tablet 650 mg  650 mg Oral Q6H PRN Bryson L Stilwell, PA-C       Or  . acetaminophen (TYLENOL) suppository 650 mg  650 mg Rectal Q6H PRN Bryson L Stilwell, PA-C      . alum & mag hydroxide-simeth (MAALOX/MYLANTA) 200-200-20 MG/5ML suspension 30 mL  30 mL Oral Q4H PRN Bryson L Stilwell, PA-C   30 mL at 01/17/15 0828  . bisacodyl (DULCOLAX) EC tablet 5 mg  5 mg Oral Daily PRN Bryson L Stilwell, PA-C      . carvedilol (COREG) tablet 12.5 mg  12.5 mg Oral BID WC Bryson L Stilwell, PA-C   12.5 mg at 01/17/15 0814  . clopidogrel (PLAVIX) tablet 75 mg  75 mg Oral Q breakfast Markham JordanBryson L Stilwell,  PA-C   75 mg at 01/17/15 0814  . diphenhydrAMINE (BENADRYL) 12.5 MG/5ML elixir 12.5-25 mg  12.5-25 mg Oral Q4H PRN Bryson L Stilwell, PA-C      . docusate sodium (COLACE) capsule 100 mg  100 mg Oral BID Bryson L Stilwell, PA-C   100 mg at 01/16/15 2219  . enoxaparin (LOVENOX) injection 30 mg  30 mg Subcutaneous Q12H Bryson L Stilwell, PA-C   30 mg at 01/17/15 0813  . ferrous sulfate tablet 325 mg  325 mg Oral TID PC Bryson L Stilwell, PA-C   325 mg at 01/16/15 1710  . fluticasone (FLONASE) 50 MCG/ACT nasal spray 1-2 spray  1-2 spray Each Nare Daily PRN Bryson L Stilwell, PA-C      . HYDROmorphone (DILAUDID) injection 0.5 mg  0.5 mg Intravenous Q3H PRN Bryson L Stilwell, PA-C   0.5 mg at 01/15/15 1856  . menthol-cetylpyridinium (CEPACOL) lozenge 3  mg  1 lozenge Oral PRN Bryson L Stilwell, PA-C       Or  . phenol (CHLORASEPTIC) mouth spray 1 spray  1 spray Mouth/Throat PRN Bryson L Stilwell, PA-C      . methocarbamol (ROBAXIN) tablet 500 mg  500 mg Oral Q6H PRN Bryson L Stilwell, PA-C   500 mg at 01/17/15 0709   Or  . methocarbamol (ROBAXIN) 500 mg in dextrose 5 % 50 mL IVPB  500 mg Intravenous Q6H PRN Bryson L Stilwell, PA-C   500 mg at 01/16/15 2221  . metoCLOPramide (REGLAN) tablet 5-10 mg  5-10 mg Oral Q8H PRN Bryson L Stilwell, PA-C       Or  . metoCLOPramide (REGLAN) injection 5-10 mg  5-10 mg Intravenous Q8H PRN Bryson L Stilwell, PA-C   10 mg at 01/17/15 0220  . ondansetron (ZOFRAN) tablet 4 mg  4 mg Oral Q6H PRN Bryson L Stilwell, PA-C       Or  . ondansetron (ZOFRAN) injection 4 mg  4 mg Intravenous Q6H PRN Bryson L Stilwell, PA-C   4 mg at 01/17/15 0828  . oxyCODONE (Oxy IR/ROXICODONE) immediate release tablet 5-10 mg  5-10 mg Oral Q3H PRN Drew Perkins, PA-C      . polyethylene glycol (MIRALAX / GLYCOLAX) packet 17 g  17 g Oral Daily PRN Bryson L Stilwell, PA-C      . sodium phosphate (FLEET) 7-19 GM/118ML enema 1 enema  1 enema Rectal Once PRN Bryson L Stilwell, PA-C       Do not use this list as official medication orders. Please verify with discharge summary.  Discharge Medications:   Medication List    STOP taking these medications        traMADol 50 MG tablet  Commonly known as:  ULTRAM      TAKE these medications        aspirin EC 325 MG tablet  Take 1 tablet (325 mg total) by mouth 2 (two) times daily.     b complex vitamins tablet  Take 1 tablet by mouth daily.     baclofen 10 MG tablet  Commonly known as:  LIORESAL  Take 1 tablet (10 mg total) by mouth 3 (three) times daily.     carvedilol 12.5 MG tablet  Commonly known as:  COREG  Take 1 tablet (12.5 mg total) by mouth 2 (two) times daily with a meal. For blood pressure     clopidogrel 75 MG tablet  Commonly known as:  PLAVIX  Take 1 tablet (75  mg total) by mouth daily   with breakfast.     diphenhydramine-acetaminophen 25-500 MG Tabs tablet  Commonly known as:  TYLENOL PM  Take 2 tablets by mouth at bedtime.     fluticasone 50 MCG/ACT nasal spray  Commonly known as:  FLONASE  Place 1-2 sprays into both nostrils daily as needed for allergies or rhinitis.     hydrochlorothiazide 25 MG tablet  Commonly known as:  HYDRODIURIL  Take 1 tablet (25 mg total) by mouth daily as needed. Swelling     oxyCODONE-acetaminophen 5-325 MG tablet  Commonly known as:  ROXICET  Take 1-2 tablets by mouth every 4 (four) hours as needed for severe pain.     PRESERVISION AREDS 2 Caps  Take 1 capsule by mouth 2 (two) times daily.     Vitamin D2 400 UNITS Tabs  Take 1 tablet by mouth 2 (two) times daily.        Relevant Imaging Results:  Relevant Lab Results:  Recent Labs    Additional Information Allergies: Ace Inhibitors, Simvastatin  FULL CODE  Annetta Maw, LCSW

## 2015-01-17 NOTE — Progress Notes (Signed)
Physical Therapy Treatment Patient Details Name: Lisa [name] MRN: [id number] DOB: 04/09/1921 Today's Date: 01/17/2015    History of Present Illness 81 yo female s/p L TKA 01/15/15. Hx of CVA with R sided weakness.     PT Comments    Patient is progressing well, has c/o nausea today.   Follow Up Recommendations  SNF     Equipment Recommendations  None recommended by PT    Recommendations for Other Services       Precautions / Restrictions Precautions Precautions: Fall Required Braces or Orthoses: Knee Immobilizer - Left Knee Immobilizer - Left: Discontinue once straight leg raise with < 10 degree lag Restrictions LLE Weight Bearing: Weight bearing as tolerated    Mobility  Bed Mobility                  Transfers   Equipment used: Rolling walker (2 wheeled) Transfers: Sit to/from Stand Sit to Stand: Min assist         General transfer comment: support tjhe L leg to the floor  Ambulation/Gait Ambulation/Gait assistance: Min guard Ambulation Distance (Feet): 60 Feet Assistive device: Rolling walker (2 wheeled)       General Gait Details: close guard for safety. Has not felt well. Nausea has been an issue.   Stairs            Wheelchair Mobility    Modified Rankin (Stroke Patients Only)       Balance                                    Cognition Arousal/Alertness: Awake/alert                          Exercises Total Joint Exercises Ankle Circles/Pumps: AROM;Both;10 reps;Supine Quad Sets: AROM;Both;10 reps;Supine Short Arc Quad: AROM;Left;10 reps;Supine Heel Slides: AAROM;Left;10 reps;Supine Hip ABduction/ADduction: AAROM;Left;10 reps;Supine Straight Leg Raises: AAROM;Left;10 reps;Supine Goniometric ROM: 10-60    General Comments        Pertinent Vitals/Pain Pain Score: 2  Pain Location: L knee Pain Descriptors / Indicators: Aching;Tender Pain Intervention(s): Premedicated before session;Ice  applied;Repositioned;Monitored during session    Home Living                      Prior Function            PT Goals (current goals can now be found in the care plan section) Progress towards PT goals: Progressing toward goals    Frequency  7X/week    PT Plan Current plan remains appropriate    Co-evaluation             End of Session Equipment Utilized During Treatment: Left knee immobilizer Activity Tolerance: Patient tolerated treatment well Patient left: in chair;with call bell/phone within reach;with family/visitor present     Time: 1216-1236 PT Time Calculation (min) (ACUTE ONLY): 20 min  Charges:  $Gait Training: 8-22 mins                    G Codes:      [name], [name] 01/17/2015, 1:17 PM

## 2015-01-17 NOTE — Progress Notes (Signed)
   Subjective: 2 Days Post-Op Procedure(s) (LRB): LEFT TOTAL KNEE ARTHROPLASTY (Left) Patient reports pain as mild.   Patient seen in rounds with Dr. Lequita HaltAluisio. Patient is havign problem with nausea earlier Plan is to go Skilled nursing facility after hospital stay.  Objective: Vital signs in last 24 hours: Temp:  [97.8 F (36.6 C)-98.9 F (37.2 C)] 98.9 F (37.2 C) (10/30 0637) Pulse Rate:  [69-79] 79 (10/30 0637) Resp:  [17-20] 17 (10/30 0637) BP: (118-147)/(45-59) 143/59 mmHg (10/30 0637) SpO2:  [94 %-97 %] 96 % (10/30 13240637)  Intake/Output from previous day:  Intake/Output Summary (Last 24 hours) at 01/17/15 0743 Last data filed at 01/17/15 0637  Gross per 24 hour  Intake   1560 ml  Output    400 ml  Net   1160 ml   Labs:  Recent Labs  01/16/15 0435 01/17/15 0441  HGB 9.7* 9.0*    Recent Labs  01/16/15 0435 01/17/15 0441  WBC 12.6* 8.9  RBC 3.29* 3.00*  HCT 29.8* 27.7*  PLT 156 136*    Recent Labs  01/16/15 0435  NA 141  K 4.7  CL 110  CO2 27  BUN 31*  CREATININE 1.07*  GLUCOSE 133*  CALCIUM 8.1*   No results for input(s): LABPT, INR in the last 72 hours.  EXAM General - Patient is Alert, Appropriate and Oriented Extremity - Neurovascular intact Sensation intact distally Dorsiflexion/Plantar flexion intact Dressing/Incision - clean, dry, no drainage Motor Function - intact, moving foot and toes well on exam.   Past Medical History  Diagnosis Date  . Unspecified vitamin D deficiency   . Hyperlipidemia LDL goal < 100   . Dermatophytosis of groin and perianal area   . Cervicalgia   . Insomnia, unspecified   . Chronic kidney disease, stage III (moderate)   . Other abnormal glucose   . Other B-complex deficiencies   . Other malaise and fatigue   . Senile cataract, unspecified   . Unspecified essential hypertension   . Muscle weakness (generalized)   . Tachycardia, unspecified   . Palpitations   . Undiagnosed cardiac murmurs   .  Shortness of breath   . Stroke [facility](HCC)     small "stroke" - right sided weakness for a day.  . Type II or unspecified type diabetes mellitus with renal manifestations, not stated as uncontrolled     "prediabetes"-no meds.  . Osteoarthrosis, unspecified whether generalized or localized, lower leg     osteoarthritis - upper thoracic area, left knee.    Assessment/Plan: 2 Days Post-Op Procedure(s) (LRB): LEFT TOTAL KNEE ARTHROPLASTY (Left) Active Problems:   Primary osteoarthritis of left knee   S/P knee replacement  Estimated body mass index is 24.64 kg/(m^2) as calculated from the following:   Height as of this encounter: 5\' 7"  (1.702 m).   Weight as of this encounter: 71.385 kg (157 lb 6 oz). Up with therapy Plan for discharge tomorrow Discharge to SNF  DVT Prophylaxis - Lovenox Weight-Bearing as tolerated to left leg  Avel Peacerew Perkins, PA-C Orthopaedic Surgery 01/17/2015, 7:43 AM

## 2015-01-17 NOTE — Clinical Social Work Note (Signed)
Clinical Social Work Assessment  Patient Details  Name: Lisa [name] MRN: [id number] Date of Birth: 1921-09-24  Date of referral:  01/17/15               Reason for consult:  Facility Placement                Permission sought to share information with:    Permission granted to share information::  Yes, Verbal Permission Granted  Name::        Agency::     Relationship::     Contact Information:     Housing/Transportation Living arrangements for the past 2 months:  Single Family Home Source of Information:  Patient, Other (Comment Required) Patient Interpreter Needed:    Criminal Activity/Legal Involvement Pertinent to Current Situation/Hospitalization:    Significant Relationships:  Adult Children Lives with:    Do you feel safe going back to the place where you live?    Need for family participation in patient care:  No (Coment)  Care giving concerns:  No caregiver   Facilities manager / plan:  CSW met with pt and her family at bedside.  Pt has support family who are all in agreement with pt needing SNF.  Pt lives alone.  Pt's daughtet Lisa [name] is HCPOA but pt is able to make her decisions without POA.  CSW sent pt information to [facility] and they are aware pt will be going to their facility for rehab.  CSW will set up ambulance for pt at discharge per pt and family instructions.  Employment status:  Retired Forensic scientist:  Managed Care PT Recommendations:  [place] / Referral to community resources:     Patient/Family's Response to care:  Pt and family all in agreement for [facility] at discharge.  Pt's family all supportive of SNF  Patient/Family's Understanding of and Emotional Response to Diagnosis, Current Treatment, and Prognosis:  Pt and family hopeful that pt will regain independence soon.  They understand pt is 44 and living alone so SNF is safest  Emotional Assessment Appearance:  Appears stated age Attitude/Demeanor/Rapport:    (pleasant) Affect (typically observed):  Accepting Orientation:  Oriented to Self, Oriented to Place, Oriented to  Time, Oriented to Situation Alcohol / Substance use:    Psych involvement (Current and /or in the community):     Discharge Needs  Concerns to be addressed:    Readmission within the last 30 days:    Current discharge risk:    Barriers to Discharge:      [name], LCSW 01/17/2015, 8:09 PM

## 2015-01-17 NOTE — [place] FL2 (Signed)
[place] MEDICAID FL2 LEVEL OF CARE SCREENING TOOL     IDENTIFICATION  Patient Name: Lisa [name] Birthdate: 08/07/1921 Sex: female Admission Date (Current Location): 01/15/2015  [facility] [place]   Facility and Address:  [facility],  [address], [place] [id number]      Provider Number: [id number]  Attending Physician Name and Address:  Eugenia Mcalpineobert Collins, MD  Relative Name and Phone Number:       Current Level of Care: Hospital Recommended Level of Care: Skilled Nursing Facility Prior Approval Number:    Date Approved/Denied:   PASRR Number: [id number]  Discharge Plan: SNF    Current Diagnoses: Patient Active Problem List   Diagnosis Date Noted  . Primary osteoarthritis of left knee 01/15/2015  . S/P knee replacement 01/15/2015  . Dry skin 01/28/2014  . HLD (hyperlipidemia) 01/06/2014  . Essential hypertension 01/06/2014  . B12 deficiency 09/29/2013  . Mixed hyperlipidemia 09/29/2013  . Hypertension, renal disease 06/26/2013  . Seasonal allergies 06/26/2013  . CVA (cerebral vascular accident) (HCC) 04/09/2013  . Stroke (HCC) 04/09/2013  . Insomnia 09/14/2012  . Hyperlipidemia LDL goal < 100   . Osteoarthrosis, unspecified whether generalized or localized, lower leg   . Undiagnosed cardiac murmurs   . Shortness of breath   . Type 2 diabetes mellitus with renal manifestations, controlled (HCC)   . Chronic kidney disease, stage III (moderate)   . Cervicalgia   . HYPERTENSION 11/30/2008  . DYSPNEA 11/30/2008  . TACHYCARDIA, HX OF 11/30/2008    Orientation ACTIVITIES/SOCIAL BLADDER RESPIRATION    Self, Time, Situation, Place    Incontinent Normal  BEHAVIORAL SYMPTOMS/MOOD NEUROLOGICAL BOWEL NUTRITION STATUS      Continent    PHYSICIAN VISITS COMMUNICATION OF NEEDS Height & Weight Skin    Verbally   157 lbs. Surgical wounds          AMBULATORY STATUS RESPIRATION    Supervision limited Normal      Personal  Care Assistance Level of Assistance  Bathing, Dressing Bathing Assistance: Limited assistance   Dressing Assistance: Limited assistance      Functional Limitations Info                SPECIAL CARE FACTORS FREQUENCY                      Additional Factors Info                  Current Medications (01/17/2015): Current Facility-Administered Medications  Medication Dose Route Frequency Provider Last Rate Last Dose  . 0.9 %  sodium chloride infusion   Intravenous Continuous Avel Peacerew Perkins, PA-C 30 mL/hr at 01/16/15 1020    . acetaminophen (TYLENOL) tablet 650 mg  650 mg Oral Q6H PRN Bryson L Stilwell, PA-C       Or  . acetaminophen (TYLENOL) suppository 650 mg  650 mg Rectal Q6H PRN Bryson L Stilwell, PA-C      . alum & mag hydroxide-simeth (MAALOX/MYLANTA) 200-200-20 MG/5ML suspension 30 mL  30 mL Oral Q4H PRN Bryson L Stilwell, PA-C   30 mL at 01/17/15 0828  . bisacodyl (DULCOLAX) EC tablet 5 mg  5 mg Oral Daily PRN Bryson L Stilwell, PA-C      . carvedilol (COREG) tablet 12.5 mg  12.5 mg Oral BID WC Bryson L Stilwell, PA-C   12.5 mg at 01/17/15 0814  . clopidogrel (PLAVIX) tablet 75 mg  75 mg Oral Q breakfast Markham JordanBryson L Stilwell,  PA-C   75 mg at 01/17/15 0814  . diphenhydrAMINE (BENADRYL) 12.5 MG/5ML elixir 12.5-25 mg  12.5-25 mg Oral Q4H PRN Bryson L Stilwell, PA-C      . docusate sodium (COLACE) capsule 100 mg  100 mg Oral BID Bryson L Stilwell, PA-C   100 mg at 01/16/15 2219  . enoxaparin (LOVENOX) injection 30 mg  30 mg Subcutaneous Q12H Bryson L Stilwell, PA-C   30 mg at 01/17/15 0813  . ferrous sulfate tablet 325 mg  325 mg Oral TID PC Bryson L Stilwell, PA-C   325 mg at 01/16/15 1710  . fluticasone (FLONASE) 50 MCG/ACT nasal spray 1-2 spray  1-2 spray Each Nare Daily PRN Bryson L Stilwell, PA-C      . HYDROmorphone (DILAUDID) injection 0.5 mg  0.5 mg Intravenous Q3H PRN Bryson L Stilwell, PA-C   0.5 mg at 01/15/15 1856  . menthol-cetylpyridinium (CEPACOL) lozenge 3  mg  1 lozenge Oral PRN Bryson L Stilwell, PA-C       Or  . phenol (CHLORASEPTIC) mouth spray 1 spray  1 spray Mouth/Throat PRN Bryson L Stilwell, PA-C      . methocarbamol (ROBAXIN) tablet 500 mg  500 mg Oral Q6H PRN Bryson L Stilwell, PA-C   500 mg at 01/17/15 0709   Or  . methocarbamol (ROBAXIN) 500 mg in dextrose 5 % 50 mL IVPB  500 mg Intravenous Q6H PRN Bryson L Stilwell, PA-C   500 mg at 01/16/15 2221  . metoCLOPramide (REGLAN) tablet 5-10 mg  5-10 mg Oral Q8H PRN Bryson L Stilwell, PA-C       Or  . metoCLOPramide (REGLAN) injection 5-10 mg  5-10 mg Intravenous Q8H PRN Bryson L Stilwell, PA-C   10 mg at 01/17/15 0220  . ondansetron (ZOFRAN) tablet 4 mg  4 mg Oral Q6H PRN Bryson L Stilwell, PA-C       Or  . ondansetron (ZOFRAN) injection 4 mg  4 mg Intravenous Q6H PRN Bryson L Stilwell, PA-C   4 mg at 01/17/15 1610  . oxyCODONE (Oxy IR/ROXICODONE) immediate release tablet 5-10 mg  5-10 mg Oral Q3H PRN Avel Peace, PA-C      . polyethylene glycol (MIRALAX / GLYCOLAX) packet 17 g  17 g Oral Daily PRN Bryson L Stilwell, PA-C      . sodium phosphate (FLEET) 7-19 GM/118ML enema 1 enema  1 enema Rectal Once PRN Bryson L Stilwell, PA-C       Do not use this list as official medication orders. Please verify with discharge summary.  Discharge Medications:   Medication List    STOP taking these medications        traMADol 50 MG tablet  Commonly known as:  ULTRAM      TAKE these medications        aspirin EC 325 MG tablet  Take 1 tablet (325 mg total) by mouth 2 (two) times daily.     b complex vitamins tablet  Take 1 tablet by mouth daily.     baclofen 10 MG tablet  Commonly known as:  LIORESAL  Take 1 tablet (10 mg total) by mouth 3 (three) times daily.     carvedilol 12.5 MG tablet  Commonly known as:  COREG  Take 1 tablet (12.5 mg total) by mouth 2 (two) times daily with a meal. For blood pressure     clopidogrel 75 MG tablet  Commonly known as:  PLAVIX  Take 1 tablet (75  mg total) by mouth daily  with breakfast.     diphenhydramine-acetaminophen 25-500 MG Tabs tablet  Commonly known as:  TYLENOL PM  Take 2 tablets by mouth at bedtime.     fluticasone 50 MCG/ACT nasal spray  Commonly known as:  FLONASE  Place 1-2 sprays into both nostrils daily as needed for allergies or rhinitis.     hydrochlorothiazide 25 MG tablet  Commonly known as:  HYDRODIURIL  Take 1 tablet (25 mg total) by mouth daily as needed. Swelling     oxyCODONE-acetaminophen 5-325 MG tablet  Commonly known as:  ROXICET  Take 1-2 tablets by mouth every 4 (four) hours as needed for severe pain.     PRESERVISION AREDS 2 Caps  Take 1 capsule by mouth 2 (two) times daily.     Vitamin D2 400 UNITS Tabs  Take 1 tablet by mouth 2 (two) times daily.        Relevant Imaging Results:  Relevant Lab Results:  Recent Labs    Additional Information Allergies: Ace Inhibitors, Simvastatin  FULL CODE  Annetta Maw, LCSW

## 2015-01-18 DIAGNOSIS — R600 Localized edema: Secondary | ICD-10-CM | POA: Diagnosis not present

## 2015-01-18 DIAGNOSIS — K5900 Constipation, unspecified: Secondary | ICD-10-CM | POA: Diagnosis not present

## 2015-01-18 DIAGNOSIS — M1712 Unilateral primary osteoarthritis, left knee: Secondary | ICD-10-CM | POA: Diagnosis not present

## 2015-01-18 DIAGNOSIS — D519 Vitamin B12 deficiency anemia, unspecified: Secondary | ICD-10-CM | POA: Diagnosis not present

## 2015-01-18 DIAGNOSIS — Z96652 Presence of left artificial knee joint: Secondary | ICD-10-CM | POA: Diagnosis not present

## 2015-01-18 DIAGNOSIS — J302 Other seasonal allergic rhinitis: Secondary | ICD-10-CM | POA: Diagnosis not present

## 2015-01-18 DIAGNOSIS — M25562 Pain in left knee: Secondary | ICD-10-CM | POA: Diagnosis not present

## 2015-01-18 DIAGNOSIS — Z471 Aftercare following joint replacement surgery: Secondary | ICD-10-CM | POA: Diagnosis not present

## 2015-01-18 DIAGNOSIS — E785 Hyperlipidemia, unspecified: Secondary | ICD-10-CM | POA: Diagnosis not present

## 2015-01-18 DIAGNOSIS — I10 Essential (primary) hypertension: Secondary | ICD-10-CM | POA: Diagnosis not present

## 2015-01-18 DIAGNOSIS — E559 Vitamin D deficiency, unspecified: Secondary | ICD-10-CM | POA: Diagnosis not present

## 2015-01-18 DIAGNOSIS — M6281 Muscle weakness (generalized): Secondary | ICD-10-CM | POA: Diagnosis not present

## 2015-01-18 DIAGNOSIS — M25662 Stiffness of left knee, not elsewhere classified: Secondary | ICD-10-CM | POA: Diagnosis not present

## 2015-01-18 DIAGNOSIS — R278 Other lack of coordination: Secondary | ICD-10-CM | POA: Diagnosis not present

## 2015-01-18 DIAGNOSIS — I129 Hypertensive chronic kidney disease with stage 1 through stage 4 chronic kidney disease, or unspecified chronic kidney disease: Secondary | ICD-10-CM | POA: Diagnosis not present

## 2015-01-18 DIAGNOSIS — R609 Edema, unspecified: Secondary | ICD-10-CM | POA: Diagnosis not present

## 2015-01-18 DIAGNOSIS — N183 Chronic kidney disease, stage 3 (moderate): Secondary | ICD-10-CM | POA: Diagnosis not present

## 2015-01-18 DIAGNOSIS — D62 Acute posthemorrhagic anemia: Secondary | ICD-10-CM | POA: Diagnosis not present

## 2015-01-18 DIAGNOSIS — Z8673 Personal history of transient ischemic attack (TIA), and cerebral infarction without residual deficits: Secondary | ICD-10-CM | POA: Diagnosis not present

## 2015-01-18 DIAGNOSIS — R2681 Unsteadiness on feet: Secondary | ICD-10-CM | POA: Diagnosis not present

## 2015-01-18 LAB — GLUCOSE, CAPILLARY: GLUCOSE-CAPILLARY: 135 mg/dL — AB (ref 65–99)

## 2015-01-18 LAB — CBC
HCT: 25 % — ABNORMAL LOW (ref 36.0–46.0)
HEMOGLOBIN: 8.6 g/dL — AB (ref 12.0–15.0)
MCH: 31 pg (ref 26.0–34.0)
MCHC: 34.4 g/dL (ref 30.0–36.0)
MCV: 90.3 fL (ref 78.0–100.0)
PLATELETS: 144 10*3/uL — AB (ref 150–400)
RBC: 2.77 MIL/uL — ABNORMAL LOW (ref 3.87–5.11)
RDW: 13.3 % (ref 11.5–15.5)
WBC: 8.8 10*3/uL (ref 4.0–10.5)

## 2015-01-18 MED ORDER — PROMETHAZINE HCL 12.5 MG PO TABS: 12.5000 mg | ORAL_TABLET | Freq: Three times a day (TID) | ORAL | Status: DC | PRN

## 2015-01-18 NOTE — Progress Notes (Signed)
Physical Therapy Treatment Patient Details Name: Lisa [name] MRN: [id number] DOB: 01/10/1922 Today's Date: 01/18/2015    History of Present Illness 81 yo female s/p L TKA 01/15/15. Hx of CVA with R sided weakness.     PT Comments    Patient  Reports that pain is mild, waiting for BM. Plans DC to snf today.  Follow Up Recommendations  SNF     Equipment Recommendations       Recommendations for Other Services       Precautions / Restrictions Precautions Precautions: Fall Restrictions Weight Bearing Restrictions: Yes LLE Weight Bearing: Weight bearing as tolerated    Mobility  Bed Mobility         Supine to sit: Min guard     General bed mobility comments: patient managing L leg  Transfers   Equipment used: Rolling walker (2 wheeled) Transfers: Sit to/from Stand Sit to Stand: Min guard            Ambulation/Gait Ambulation/Gait assistance: Min guard Ambulation Distance (Feet): 60 Feet Assistive device: Rolling walker (2 wheeled) Gait Pattern/deviations: Step-to pattern;Decreased step length - right;Decreased stance time - right     General Gait Details: close guard for safety. Has not felt well. Nausea has been an issue.   Stairs            Wheelchair Mobility    Modified Rankin (Stroke Patients Only)       Balance                                    Cognition Arousal/Alertness: Awake/alert                          Exercises Total Joint Exercises Ankle Circles/Pumps: AROM;Both;10 reps;Supine Quad Sets: AROM;Both;10 reps;Supine Towel Squeeze: AROM;10 reps;Supine Short Arc Quad: AROM;Left;10 reps;Supine Heel Slides: AAROM;Left;10 reps;Supine Hip ABduction/ADduction: AAROM;Left;10 reps;Supine Straight Leg Raises: AAROM;Left;10 reps;Supine Goniometric ROM: 10-60 l knee flexion    General Comments        Pertinent Vitals/Pain Pain Score: 2  Pain Location: L knee Pain Descriptors / Indicators:  Sore Pain Intervention(s): Limited activity within patient's tolerance;Ice applied    Home Living                      Prior Function            PT Goals (current goals can now be found in the care plan section) Progress towards PT goals: Progressing toward goals    Frequency  7X/week    PT Plan Current plan remains appropriate    Co-evaluation             End of Session   Activity Tolerance: Patient limited by fatigue Patient left: in chair;with call bell/phone within reach;with family/visitor present     Time: [id number] PT Time Calculation (min) (ACUTE ONLY): 27 min  Charges:  $Gait Training: 8-22 mins $Therapeutic Exercise: 8-22 mins                    G Codes:      [name], [name] 01/18/2015, 10:05 AM Blanchard KelchKaren Terre Zabriskie PT [phone]

## 2015-01-18 NOTE — Progress Notes (Signed)
Subjective: 3 Days Post-Op Procedure(s) (LRB): LEFT TOTAL KNEE ARTHROPLASTY (Left) Patient reports pain as moderate to left knee.  Tolerating PO's. Nausea reported. Progressing with PT. Denies SOB, CP, or cald pain. No F/c. Family at bedside.  Objective: Vital signs in last 24 hours: Temp:  [97.9 F (36.6 C)-99.3 F (37.4 C)] 99.1 F (37.3 C) (10/31 0549) Pulse Rate:  [71-80] 77 (10/31 0549) Resp:  [16-17] 16 (10/31 0549) BP: (115-169)/(40-69) 169/69 mmHg (10/31 0549) SpO2:  [91 %-94 %] 91 % (10/31 0549)  Intake/Output from previous day: 10/30 0701 - 10/31 0700 In: 600 [P.O.:600] Out: 251 [Urine:251] Intake/Output this shift:     Recent Labs  01/16/15 0435 01/17/15 0441 01/18/15 0517  HGB 9.7* 9.0* 8.6*    Recent Labs  01/17/15 0441 01/18/15 0517  WBC 8.9 8.8  RBC 3.00* 2.77*  HCT 27.7* 25.0*  PLT 136* 144*    Recent Labs  01/16/15 0435  NA 141  K 4.7  CL 110  CO2 27  BUN 31*  CREATININE 1.07*  GLUCOSE 133*  CALCIUM 8.1*   No results for input(s): LABPT, INR in the last 72 hours.  Alert and oriented x3. RRR, Lungs clear, BS x4. Left Calf soft and non tender. L knee dressing D/I. Drainage noted, but is not active.  No DVT signs. No signs of infection or compartment syndrome. LLE grossly neurovascularly intact.   Assessment/Plan: 3 Days Post-Op Procedure(s) (LRB): LEFT TOTAL KNEE ARTHROPLASTY (Left) D/c to SNF Up with PT F/u in office in 2 weeks  Nausea: Monitor and continue meds  Constipation: Monitor Fleets enema this am. Continue current care Family updated and questions answered. Please call with any changes.    Zyria Fiscus L 01/18/2015, 7:52 AM

## 2015-01-18 NOTE — Progress Notes (Signed)
Attempt[facility] to call [facility]. No answer when my call was transferred to the accepting nurse.

## 2015-01-18 NOTE — Care Management Note (Signed)
Case Management Note  Patient Details  Name: Doretha SouKathleen N Pocius MRN: [id number] Date of Birth: 10/02/1921  Subjective/Objective:    S/p Left Total Knee Arthroplasty               Action/Plan: Discharge planning per CSW  Expected Discharge Date:                  Expected Discharge Plan:  Skilled Nursing Facility  In-House Referral:  Clinical Social Work  Discharge planning Services  CM Consult  Post Acute Care Choice:  NA Choice offered to:  NA  DME Arranged:  N/A DME Agency:  NA  HH Arranged:  NA HH Agency:  NA  Status of Service:  Completed, signed off  Medicare Important Message Given:    Date Medicare IM Given:    Medicare IM give by:    Date Additional Medicare IM Given:    Additional Medicare Important Message give by:     If discussed at Long Length of Stay Meetings, dates discussed:    Additional Comments:  Alexis Goodelleele, Beatrix Breece K, RN 01/18/2015, 10:58 AM

## 2015-01-18 NOTE — Clinical Social Work Placement (Signed)
   CLINICAL SOCIAL WORK PLACEMENT  NOTE  Date:  01/18/2015  Patient Details  Name: Lisa [name] MRN: [id number] Date of Birth: 03/31/1921  Clinical Social Work is seeking post-discharge placement for this patient at the Skilled  Nursing Facility level of care (*CSW will initial, date and re-position this form in  chart as items are completed):  Yes   Patient/family provided with [facility] Clinical Social Work Department's list of facilities offering this level of care within the geographic area requested by the patient (or if unable, by the patient's family).  Yes   Patient/family informed of their freedom to choose among providers that offer the needed level of care, that participate in Medicare, Medicaid or managed care program needed by the patient, have an available bed and are willing to accept the patient.  Yes   Patient/family informed of [facility]'s ownership interest in [facility] and [facility], as well as of the fact that they are under no obligation to receive care at these facilities.  PASRR submitted to EDS on 01/17/15     PASRR number received on 01/17/15     Existing PASRR number confirmed on       FL2 transmitted to all facilities in geographic area requested by pt/family on 01/17/15     FL2 transmitted to all facilities within larger geographic area on       Patient informed that his/her managed care company has contracts with or will negotiate with certain facilities, including the following:        Yes   Patient/family informed of bed offers received.  Patient chooses bed at  [facility]     Physician recommends and patient chooses bed at      [facility].  Patient to be transferred to facility by [facility]     Patient family notified on 01/18/15 of transfer.  Name of family member notified:  DAUGHTER     PHYSICIAN       Additional Comment: Pt / daughter agree with d/[facility] to [facility]. [facility]  transport required. Pt / family are aware out of pocket costs may be associated with [facility] transport. NSG reviewed d/c summary, scripts, avs. Scripts included in d/c packet. Dc Summary sent to SNF for review prior to d/c.    _______________________________________________ Royetta AsalHaidinger, Timika Muench Lee, LCSW  [phone] 01/18/2015, 2:49 PM

## 2015-01-19 ENCOUNTER — Non-Acute Institutional Stay (SKILLED_NURSING_FACILITY): Payer: Medicare Other | Admitting: Adult Health

## 2015-01-19 DIAGNOSIS — E559 Vitamin D deficiency, unspecified: Secondary | ICD-10-CM | POA: Diagnosis not present

## 2015-01-19 DIAGNOSIS — J302 Other seasonal allergic rhinitis: Secondary | ICD-10-CM

## 2015-01-19 DIAGNOSIS — I10 Essential (primary) hypertension: Secondary | ICD-10-CM | POA: Diagnosis not present

## 2015-01-19 DIAGNOSIS — Z8673 Personal history of transient ischemic attack (TIA), and cerebral infarction without residual deficits: Secondary | ICD-10-CM | POA: Diagnosis not present

## 2015-01-19 DIAGNOSIS — N183 Chronic kidney disease, stage 3 unspecified: Secondary | ICD-10-CM

## 2015-01-19 DIAGNOSIS — D62 Acute posthemorrhagic anemia: Secondary | ICD-10-CM

## 2015-01-19 DIAGNOSIS — R609 Edema, unspecified: Secondary | ICD-10-CM | POA: Diagnosis not present

## 2015-01-19 DIAGNOSIS — M1712 Unilateral primary osteoarthritis, left knee: Secondary | ICD-10-CM | POA: Diagnosis not present

## 2015-01-21 ENCOUNTER — Encounter: Payer: Self-pay | Admitting: Adult Health

## 2015-01-25 ENCOUNTER — Encounter: Payer: Self-pay | Admitting: Internal Medicine

## 2015-01-25 ENCOUNTER — Non-Acute Institutional Stay (SKILLED_NURSING_FACILITY): Payer: Medicare Other | Admitting: Internal Medicine

## 2015-01-25 DIAGNOSIS — R2681 Unsteadiness on feet: Secondary | ICD-10-CM | POA: Diagnosis not present

## 2015-01-25 DIAGNOSIS — K5900 Constipation, unspecified: Secondary | ICD-10-CM

## 2015-01-25 DIAGNOSIS — D62 Acute posthemorrhagic anemia: Secondary | ICD-10-CM | POA: Diagnosis not present

## 2015-01-25 DIAGNOSIS — M1712 Unilateral primary osteoarthritis, left knee: Secondary | ICD-10-CM | POA: Diagnosis not present

## 2015-01-25 DIAGNOSIS — R600 Localized edema: Secondary | ICD-10-CM

## 2015-01-25 DIAGNOSIS — I639 Cerebral infarction, unspecified: Secondary | ICD-10-CM

## 2015-01-25 DIAGNOSIS — N183 Chronic kidney disease, stage 3 unspecified: Secondary | ICD-10-CM

## 2015-01-25 DIAGNOSIS — I10 Essential (primary) hypertension: Secondary | ICD-10-CM | POA: Diagnosis not present

## 2015-01-25 NOTE — Progress Notes (Signed)
Patient ID: Lisa [name], female   DOB: 06/29/1921, 81 y.o.   MRN: [id number]     [facility]  PCP: Bufford SpikesEED, TIFFANY, DO  Code Status: DNR  Allergies  Allergen Reactions  . Ace Inhibitors Cough  . Simvastatin Other (See Comments)    Body aches and pains    Chief Complaint  Patient presents with  . New Admit To SNF    New Admission      HPI:  81 y.o. patient is here for short term rehabilitation post hospital admission from 01/15/15-01/18/15 with left knee OA. She underwent left total knee arthroplasty. She is seen in her room today with her daughter present. She has been constipated. She has been refusing aspirin for dvt prophylaxis with her being on plavix as she is concerned about bleeding. She denies any other concerns this visit. Her pain is under control with current regimen.  Review of Systems:  Constitutional: Negative for fever, chills, diaphoresis.  HENT: Negative for headache, congestion, nasal discharge, difficulty swallowing.   Eyes: Negative for eye pain, blurred vision, double vision and discharge.  Respiratory: Negative for cough, wheezing.  has some dyspnea with exertion but mentions this has been chronic Cardiovascular: Negative for chest pain, palpitations, leg swelling.  Gastrointestinal: Negative for heartburn, nausea, vomiting, abdominal pain. Last bowel movement a week back and at home has one every 3-4 days. Passing flatus. Genitourinary: Negative for dysuria, flank pain.  Musculoskeletal: Negative for back pain, falls. Skin: Negative for itching, rash.  Neurological: Negative for dizziness, tingling. Has hx of cva 2 and a half years back Psychiatric/Behavioral: Negative for depression.   Past Medical History  Diagnosis Date  . Unspecified vitamin D deficiency   . Hyperlipidemia LDL goal < 100   . Dermatophytosis of groin and perianal area   . Cervicalgia   . Insomnia, unspecified   . Chronic kidney disease, stage III (moderate)     . Other abnormal glucose   . Other B-complex deficiencies   . Other malaise and fatigue   . Senile cataract, unspecified   . Unspecified essential hypertension   . Muscle weakness (generalized)   . Tachycardia, unspecified   . Palpitations   . Undiagnosed cardiac murmurs   . Shortness of breath   . Stroke [facility])     small "stroke" - right sided weakness for a day.  . Type II or unspecified type diabetes mellitus with renal manifestations, not stated as uncontrolled     "prediabetes"-no meds.  . Osteoarthrosis, unspecified whether generalized or localized, lower leg     osteoarthritis - upper thoracic area, left knee.   Past Surgical History  Procedure Laterality Date  . Tonsillectomy  1944  . Tubal ligation  1951  . Toe amputation  2002  . Cataract extraction, bilateral Bilateral   . Eye surgery      cataract  . Tonsillectomy      younger yrs  . Total knee arthroplasty Left 01/15/2015    Procedure: LEFT TOTAL KNEE ARTHROPLASTY;  Surgeon: Eugenia Mcalpineobert Collins, MD;  Location: [facility];  Service: Orthopedics;  Laterality: Left;   Social History:   reports that she has never smoked. She has never used smokeless tobacco. She reports that she drinks alcohol. She reports that she does not use illicit drugs.  Family History  Problem Relation Age of Onset  . Cerebral aneurysm Mother   . Heart attack Father     Medications:   Medication List  This list is accurate as of: 01/25/15 11:21 AM.  Always use your most recent med list.               aspirin EC 325 MG tablet  Take 1 tablet (325 mg total) by mouth 2 (two) times daily.     b complex vitamins tablet  Take 1 tablet by mouth daily.     baclofen 10 MG tablet  Commonly known as:  LIORESAL  Take 1 tablet (10 mg total) by mouth 3 (three) times daily.     carvedilol 12.5 MG tablet  Commonly known as:  COREG  Take 1 tablet (12.5 mg total) by mouth 2 (two) times daily with a meal. For blood pressure     clopidogrel 75  MG tablet  Commonly known as:  PLAVIX  Take 1 tablet (75 mg total) by mouth daily with breakfast.     fluticasone 50 MCG/ACT nasal spray  Commonly known as:  FLONASE  Place 2 sprays into both nostrils daily as needed for allergies or rhinitis.     hydrochlorothiazide 25 MG tablet  Commonly known as:  HYDRODIURIL  Take 1 tablet (25 mg total) by mouth daily as needed. Swelling     oxyCODONE-acetaminophen 5-325 MG tablet  Commonly known as:  ROXICET  Take 1-2 tablets by mouth every 4 (four) hours as needed for severe pain.     PRESERVISION AREDS 2 Caps  Take 1 capsule by mouth 2 (two) times daily.     promethazine 25 MG tablet  Commonly known as:  PHENERGAN  Take 25 mg by mouth every 6 (six) hours as needed for nausea or vomiting.     Vitamin D2 400 UNITS Tabs  Take 1 tablet by mouth 2 (two) times daily.         Physical Exam:  Filed Vitals:   01/25/15 1112  BP: 118/48  Pulse: 59  Temp: 98 F (36.7 C)  TempSrc: Oral  Resp: 20  SpO2: 95%    General- elderly female, well built, in no acute distress Head- normocephalic, atraumatic Nose- normal nasal mucosa, no maxillary or frontal sinus tenderness, no nasal discharge Throat- moist mucus membrane  Eyes- PERRLA, EOMI, no pallor, no icterus, no discharge, normal conjunctiva, normal sclera Neck- no cervical lymphadenopathy Cardiovascular- normal s1,s2, no murmurs, palpable dorsalis pedis, 1+ left leg edema Respiratory- bilateral clear to auscultation, no wheeze, no rhonchi, no crackles, no use of accessory muscles Abdomen- bowel sounds present, soft, non tender Musculoskeletal- able to move all 4 extremities, left leg limited range of motion, right leg has some residual weakness from old stroke Neurological- no focal deficit, alert and oriented to person, place and time Skin- warm and dry, left knee has aquacel dressing with some drainage and that area was marked on 01/19/15 without any further increase, has bruise to left  thigh, calf area extending upto ankle and non tender Psychiatry- normal mood and affect    Labs reviewed: Basic Metabolic Panel:  Recent Labs  16/10/96 0806 01/08/15 1055 01/16/15 0435  NA 144 141 141  K 4.4 4.5 4.7  CL 105 107 110  CO2 GLUCOSE 124* 123* 133*  BUN 19 22* 31*  CREATININE 1.00 1.00 1.07*  CALCIUM 8.9 9.1 8.1*   Liver Function Tests:  Recent Labs  07/14/14 0806  AST 13  ALT 12  ALKPHOS 78  BILITOT 0.6  PROT 6.3  ALBUMIN 4.1   No results for input(s): LIPASE, AMYLASE in the last  8760 hours. No results for input(s): AMMONIA in the last 8760 hours. CBC:  Recent Labs  04/14/14 0817 07/14/14 0806  01/16/15 0435 01/17/15 0441 01/18/15 0517  WBC 6.0 5.6  < > 12.6* 8.9 8.8  NEUTROABS 4.0 3.7  --   --   --   --   HGB 14.0  --   < > 9.7* 9.0* 8.6*  HCT 42.0 40.4  < > 29.8* 27.7* 25.0*  MCV 88  --   < > 90.6 92.3 90.3  PLT  --   --   < > 156 136* 144*  < > = values in this interval not displayed. Cardiac Enzymes: No results for input(s): CKTOTAL, CKMB, CKMBINDEX, TROPONINI in the last 8760 hours. BNP: Invalid input(s): POCBNP CBG:  Recent Labs  01/16/15 0739 01/17/15 0744 01/18/15 0728  GLUCAP 126* 112* 135*     Assessment/Plan  Unsteady gait S/p left TKA. Will have her work with physical therapy and occupational therapy team to help with gait training and muscle strengthening exercises.fall precautions. Skin care. Encourage to be out of bed.   Left knee OA S/p left total knee arthroplasty. Pain is under control. Change her roxicet from 5-325 mg 1-2 tab q4h prn to q6h prn for pain for now. Continue baclofen 10 mg tid for spasm. Currently on aspirin 325 mg bid for dvt prophylaxis. Patient has concerns of being on both aspirin and plavix. Thus not taking it. Spoke with her PCP Dr Renato Gails over the phone. Given her limited mobility and risk for DVT, will start aspirin EC 325 mg bid and hold off on plavix for now. patinet's plavix will be  resumed during office visit.   Blood loss anemia Post op, monitor h&h esp with her being on both aspirin and plavix  Left leg edema Add ted hose to help with the edema and to keep legs elevated at rest  Constipation Add senna s 2 tab qhs and miralax daily and reassess. Hydration encouraged  Old CVA Hold plavix for now and start aspirin EC 325 mg bid for now  HTN Stable BP. Monitor vital signs. Continue coreg 12.5 mg bid and HCTZ 25 mg qd prn only. Check bmp  CKD stage 3 With her on prn Hctz, check bmp. Avoid NSAIDs.   Goals of care: short term rehabilitation   Labs/tests ordered: cbc, cmp  Family/ staff Communication: reviewed care plan with patient and nursing supervisor    Oneal Grout, MD  [facility] [phone] (Monday-Friday 8 am - 5 pm) [phone] (afterhours)

## 2015-01-29 DIAGNOSIS — Z471 Aftercare following joint replacement surgery: Secondary | ICD-10-CM | POA: Diagnosis not present

## 2015-01-29 DIAGNOSIS — Z96652 Presence of left artificial knee joint: Secondary | ICD-10-CM | POA: Diagnosis not present

## 2015-02-08 ENCOUNTER — Non-Acute Institutional Stay (SKILLED_NURSING_FACILITY): Payer: Medicare Other | Admitting: Adult Health

## 2015-02-08 ENCOUNTER — Encounter: Payer: Self-pay | Admitting: Adult Health

## 2015-02-08 DIAGNOSIS — J302 Other seasonal allergic rhinitis: Secondary | ICD-10-CM | POA: Diagnosis not present

## 2015-02-08 DIAGNOSIS — D62 Acute posthemorrhagic anemia: Secondary | ICD-10-CM | POA: Diagnosis not present

## 2015-02-08 DIAGNOSIS — I10 Essential (primary) hypertension: Secondary | ICD-10-CM

## 2015-02-08 DIAGNOSIS — E559 Vitamin D deficiency, unspecified: Secondary | ICD-10-CM | POA: Diagnosis not present

## 2015-02-08 DIAGNOSIS — M1712 Unilateral primary osteoarthritis, left knee: Secondary | ICD-10-CM

## 2015-02-08 DIAGNOSIS — N183 Chronic kidney disease, stage 3 unspecified: Secondary | ICD-10-CM

## 2015-02-08 DIAGNOSIS — Z8673 Personal history of transient ischemic attack (TIA), and cerebral infarction without residual deficits: Secondary | ICD-10-CM | POA: Diagnosis not present

## 2015-02-08 DIAGNOSIS — R609 Edema, unspecified: Secondary | ICD-10-CM | POA: Diagnosis not present

## 2015-02-08 NOTE — Progress Notes (Signed)
Patient ID: Lisa [name], female   DOB: 01/17/1922, 81 y.o.   MRN: [id number]    DATE: 01/19/15  MRN:  [id number]  BIRTHDAY: 12/25/1921  Facility:  Nursing Home [facility]:  [facility]  Nursing Home Room Number: [phone]  LEVEL OF CARE:  SNF [phone](31)  Contact Information    Name Relation Home Work Mobile   [place],K[place] Daughter [phone]     Clarita LeberRash,June Friend   [phone]   Hunt,Phoebe Daughter   [phone]       Chief Complaint  Patient presents with  . Hospitalization Follow-up    Osteoarthritis S/P left total knee arthroplasty, history of stroke, hypertension, seasonal allergies, vitamin D deficiency, edema, acute blood loss anemia, edema and chronic kidney disease stage III    HISTORY OF PRESENT ILLNESS:  This is a 81 year old female who [facility]. She has PMH of vitamin D deficiency, insomnia, cervicalgia, chronic kidney disease stage III, type 2 diabetes mellitus with renal manifestations, hypertension and tachycardia. She has osteoarthritis of left knee for which she had left total knee arthroplasty on 01/15/15.  She has been admitted for a short-term rehabilitation.  PAST MEDICAL HISTORY:  Past Medical History  Diagnosis Date  . Unspecified vitamin D deficiency   . Hyperlipidemia LDL goal < 100   . Dermatophytosis of groin and perianal area   . Cervicalgia   . Insomnia, unspecified   . Chronic kidney disease, stage III (moderate)   . Other abnormal glucose   . Other B-complex deficiencies   . Other malaise and fatigue   . Senile cataract, unspecified   . Unspecified essential hypertension   . Muscle weakness (generalized)   . Tachycardia, unspecified   . Palpitations   . Undiagnosed cardiac murmurs   . Shortness of breath   . Stroke [facility])     small "stroke" - right sided weakness for a day.  . Type II or unspecified type diabetes mellitus with renal manifestations, not stated  as uncontrolled     "prediabetes"-no meds.  . Osteoarthrosis, unspecified whether generalized or localized, lower leg     osteoarthritis - upper thoracic area, left knee.     CURRENT MEDICATIONS: Reviewed  Patient's Medications  New Prescriptions   No medications on file  Previous Medications   ASPIRIN EC 325 MG TABLET    Take 1 tablet (325 mg total) by mouth 2 (two) times daily.   B COMPLEX VITAMINS TABLET    Take 1 tablet by mouth daily.   BACLOFEN (LIORESAL) 10 MG TABLET    Take 1 tablet (10 mg total) by mouth 3 (three) times daily.   CARVEDILOL (COREG) 12.5 MG TABLET    Take 1 tablet (12.5 mg total) by mouth 2 (two) times daily with a meal. For blood pressure   CLOPIDOGREL (PLAVIX) 75 MG TABLET    Take 1 tablet (75 mg total) by mouth daily with breakfast.   ERGOCALCIFEROL (VITAMIN D2) 400 UNITS TABS    Take 1 tablet by mouth 2 (two) times daily.   FLUTICASONE (FLONASE) 50 MCG/ACT NASAL SPRAY    Place 2 sprays into both nostrils daily as needed for allergies or rhinitis.    HYDROCHLOROTHIAZIDE (HYDRODIURIL) 25 MG TABLET    Take 1 tablet (25 mg total) by mouth daily as needed. Swelling   MULTIPLE VITAMINS-MINERALS (PRESERVISION AREDS 2) CAPS    Take 1 capsule by mouth 2 (two) times daily.   OXYCODONE-ACETAMINOPHEN (ROXICET) 5-325  MG TABLET    Take 1-2 tablets by mouth every 4 (four) hours as needed for severe pain.   PROMETHAZINE (PHENERGAN) 25 MG TABLET    Take 25 mg by mouth every 6 (six) hours as needed for nausea or vomiting.  Modified Medications   No medications on file  Discontinued Medications   DIPHENHYDRAMINE-ACETAMINOPHEN (TYLENOL PM) 25-500 MG TABS TABLET    Take 2 tablets by mouth at bedtime.   PROMETHAZINE (PHENERGAN) 12.5 MG TABLET    Take 1 tablet (12.5 mg total) by mouth every 8 (eight) hours as needed for nausea or vomiting.     Allergies  Allergen Reactions  . Ace Inhibitors Cough  . Simvastatin Other (See Comments)    Body aches and pains     REVIEW OF  SYSTEMS:  GENERAL: no change in appetite, no fatigue, no weight changes, no fever, chills or weakness EYES: Denies change in vision, dry eyes, eye pain, itching or discharge EARS: Denies change in hearing, ringing in ears, or earache NOSE: Denies nasal congestion or epistaxis MOUTH and THROAT: Denies oral discomfort, gingival pain or bleeding, pain from teeth or hoarseness   RESPIRATORY: no cough, SOB, DOE, wheezing, hemoptysis CARDIAC: no chest pain, or palpitations GI: no abdominal pain, diarrhea, constipation, heart burn, nausea or vomiting GU: Denies dysuria, frequency, hematuria, incontinence, or discharge PSYCHIATRIC: Denies feeling of depression or anxiety. No report of hallucinations, insomnia, paranoia, or agitation   PHYSICAL EXAMINATION  GENERAL APPEARANCE: Well nourished. In no acute distress. Normal body habitus SKIN:  Left knee surgical incision has Aquacel dressing, no erythema HEAD: Normal in size and contour. No evidence of trauma EYES: Lids open and close normally. No blepharitis, entropion or ectropion. PERRL. Conjunctivae are clear and sclerae are white. Lenses are without opacity EARS: Pinnae are normal. Patient hears normal voice tunes of the examiner MOUTH and THROAT: Lips are without lesions. Oral mucosa is moist and without lesions. Tongue is normal in shape, size, and color and without lesions NECK: supple, trachea midline, no neck masses, no thyroid tenderness, no thyromegaly LYMPHATICS: no LAN in the neck, no supraclavicular LAN RESPIRATORY: breathing is even & unlabored, BS CTAB CARDIAC: RRR, no murmur,no extra heart sounds, LLE edema 1+ GI: abdomen soft, normal BS, no masses, no tenderness, no hepatomegaly, no splenomegaly EXTREMITIES:  Able to move 4 extremities PSYCHIATRIC: Alert and oriented X 3. Affect and behavior are appropriate  LABS/RADIOLOGY: Labs reviewed: Basic Metabolic Panel:  Recent Labs  16/10/96 0806 01/08/15 1055 01/16/15 0435  NA  144 141 141  K 4.4 4.5 4.7  CL 105 107 110  CO2 GLUCOSE 124* 123* 133*  BUN 19 22* 31*  CREATININE 1.00 1.00 1.07*  CALCIUM 8.9 9.1 8.1*   Liver Function Tests:  Recent Labs  07/14/14 0806  AST 13  ALT 12  ALKPHOS 78  BILITOT 0.6  PROT 6.3  ALBUMIN 4.1   CBC:  Recent Labs  04/14/14 0817 07/14/14 0806  01/16/15 0435 01/17/15 0441 01/18/15 0517  WBC 6.0 5.6  < > 12.6* 8.9 8.8  NEUTROABS 4.0 3.7  --   --   --   --   HGB 14.0  --   < > 9.7* 9.0* 8.6*  HCT 42.0 40.4  < > 29.8* 27.7* 25.0*  MCV 88  --   < > 90.6 92.3 90.3  PLT  --   --   < > 156 136* 144*  < > = values in this interval not  displayed.  Lipid Panel:  Recent Labs  04/14/14 0817  HDL 25*   CBG:  Recent Labs  01/16/15 0739 01/17/15 0744 01/18/15 0728  GLUCAP 126* 112* 135*     ASSESSMENT/PLAN:  Osteoarthritis S/P left total knee arthroplasty - rehabilitation; WBAT; continue aspirin EC 325 mg 1 tab by mouth twice a day for DVT prophylaxis; baclofen 10 mg 1 tab by mouth 3 times a day for muscle spasm; Percocet 5/325 mg 1-2 tabs by mouth every 4 hours when necessary for pain; follow-up with Dr. Thomasena Edis, orthopedic surgeon, in 2 weeks  History of stroke - continue Plavix 75 mg 1 tab by mouth daily  Hypertension - continue carvedilol 12.5 mg 1 tab by mouth twice a day  Seasonal allergies - continue fluticasone 50 g/ACT 1-2 sprays into both nostrils daily when necessary  Vitamin D deficiency - continue vitamin D 2 400 units 1 tab by mouth twice a day  Edema - HCTZ 25 mg 1 tab by mouth daily when necessary  Anemia, acute blood loss - hemoglobin 8.6; will monitor  Chronic kidney disease stage III - creatinine 1.07; will monitor    Goals of care:  Short-term rehabilitation    [facility], NP [facility] [phone]

## 2015-02-09 DIAGNOSIS — Z7982 Long term (current) use of aspirin: Secondary | ICD-10-CM | POA: Diagnosis not present

## 2015-02-09 DIAGNOSIS — N183 Chronic kidney disease, stage 3 (moderate): Secondary | ICD-10-CM | POA: Diagnosis not present

## 2015-02-09 DIAGNOSIS — Z96652 Presence of left artificial knee joint: Secondary | ICD-10-CM | POA: Diagnosis not present

## 2015-02-09 DIAGNOSIS — I69351 Hemiplegia and hemiparesis following cerebral infarction affecting right dominant side: Secondary | ICD-10-CM | POA: Diagnosis not present

## 2015-02-09 DIAGNOSIS — Z471 Aftercare following joint replacement surgery: Secondary | ICD-10-CM | POA: Diagnosis not present

## 2015-02-09 DIAGNOSIS — M47814 Spondylosis without myelopathy or radiculopathy, thoracic region: Secondary | ICD-10-CM | POA: Diagnosis not present

## 2015-02-09 DIAGNOSIS — I129 Hypertensive chronic kidney disease with stage 1 through stage 4 chronic kidney disease, or unspecified chronic kidney disease: Secondary | ICD-10-CM | POA: Diagnosis not present

## 2015-02-09 DIAGNOSIS — E785 Hyperlipidemia, unspecified: Secondary | ICD-10-CM | POA: Diagnosis not present

## 2015-02-09 DIAGNOSIS — Z79891 Long term (current) use of opiate analgesic: Secondary | ICD-10-CM | POA: Diagnosis not present

## 2015-02-09 DIAGNOSIS — R7303 Prediabetes: Secondary | ICD-10-CM | POA: Diagnosis not present

## 2015-02-09 NOTE — Progress Notes (Signed)
Patient ID: Lisa [name], female   DOB: 09-20-1921, 81 y.o.   MRN: [id number]    DATE: 02/08/15  MRN:  [id number]  BIRTHDAY: [birth year]  Facility:  Nursing Home Location:  [facility]  Nursing Home Room Number: [id number]  LEVEL OF CARE:  SNF (31)  Contact Information    Name Relation Home Work Mobile   [place] Daughter [phone]     Clarita Leber   [phone]   Hunt,Phoebe Daughter   [phone]       Chief Complaint  Patient presents with  . Discharge Note    Osteoarthritis S/P left total knee arthroplasty, history of stroke, hypertension, seasonal allergies, vitamin D deficiency, edema, acute blood loss anemia, edema and chronic kidney disease stage III    HISTORY OF PRESENT ILLNESS:  This is a 81 year old female who is for discharge home with home health PT and OT. She has been admitted to [facility] on 01/18/15 from [facility]. She has PMH of vitamin D deficiency, insomnia, cervicalgia, chronic kidney disease stage III, type 2 diabetes mellitus with renal manifestations, hypertension and tachycardia. She has osteoarthritis of left knee for which she had left total knee arthroplasty on 01/15/15.  Patient was admitted to this facility for short-term rehabilitation after the patient's recent hospitalization.  Patient has completed SNF rehabilitation and therapy has cleared the patient for discharge.  PAST MEDICAL HISTORY:  Past Medical History  Diagnosis Date  . Unspecified vitamin D deficiency   . Hyperlipidemia LDL goal < 100   . Dermatophytosis of groin and perianal area   . Cervicalgia   . Insomnia, unspecified   . Chronic kidney disease, stage III (moderate)   . Other abnormal glucose   . Other B-complex deficiencies   . Other malaise and fatigue   . Senile cataract, unspecified   . Unspecified essential hypertension   . Muscle weakness (generalized)   . Tachycardia, unspecified   . Palpitations   . Undiagnosed cardiac  murmurs   . Shortness of breath   . Stroke [facility])     small "stroke" - right sided weakness for a day.  . Type II or unspecified type diabetes mellitus with renal manifestations, not stated as uncontrolled     "prediabetes"-no meds.  . Osteoarthrosis, unspecified whether generalized or localized, lower leg     osteoarthritis - upper thoracic area, left knee.     CURRENT MEDICATIONS: Reviewed  Patient's Medications  New Prescriptions   No medications on file  Previous Medications   ASPIRIN EC 325 MG TABLET    Take 1 tablet (325 mg total) by mouth 2 (two) times daily.   B COMPLEX VITAMINS TABLET    Take 1 tablet by mouth daily.   BACLOFEN (LIORESAL) 10 MG TABLET    Take 1 tablet (10 mg total) by mouth 3 (three) times daily.   CARVEDILOL (COREG) 12.5 MG TABLET    Take 1 tablet (12.5 mg total) by mouth 2 (two) times daily with a meal. For blood pressure   CLOPIDOGREL (PLAVIX) 75 MG TABLET    Take 1 tablet (75 mg total) by mouth daily with breakfast.   ERGOCALCIFEROL (VITAMIN D2) 400 UNITS TABS    Take 1 tablet by mouth 2 (two) times daily.   FLUTICASONE (FLONASE) 50 MCG/ACT NASAL SPRAY    Place 2 sprays into both nostrils daily as needed for allergies or rhinitis.    HYDROCHLOROTHIAZIDE (HYDRODIURIL) 25 MG TABLET    Take 1 tablet (25 mg total)  by mouth daily as needed. Swelling   MULTIPLE VITAMINS-MINERALS (PRESERVISION AREDS 2) CAPS    Take 1 capsule by mouth 2 (two) times daily.   OXYCODONE-ACETAMINOPHEN (ROXICET) 5-325 MG TABLET    Take 1-2 tablets by mouth every 4 (four) hours as needed for severe pain.   POLYETHYLENE GLYCOL (MIRALAX / GLYCOLAX) PACKET    Take 17 g by mouth daily.   PROMETHAZINE (PHENERGAN) 25 MG TABLET    Take 25 mg by mouth every 6 (six) hours as needed for nausea or vomiting.   PROTEIN (PROCEL PO)    Take 1 scoop by mouth 2 (two) times daily.  Modified Medications   No medications on file  Discontinued Medications   No medications on file     Allergies    Allergen Reactions  . Ace Inhibitors Cough  . Simvastatin Other (See Comments)    Body aches and pains     REVIEW OF SYSTEMS:  GENERAL: no change in appetite, no fatigue, no weight changes, no fever, chills or weakness EYES: Denies change in vision, dry eyes, eye pain, itching or discharge EARS: Denies change in hearing, ringing in ears, or earache NOSE: Denies nasal congestion or epistaxis MOUTH and THROAT: Denies oral discomfort, gingival pain or bleeding, pain from teeth or hoarseness   RESPIRATORY: no cough, SOB, DOE, wheezing, hemoptysis CARDIAC: no chest pain, or palpitations GI: no abdominal pain, diarrhea, constipation, heart burn, nausea or vomiting GU: Denies dysuria, frequency, hematuria, incontinence, or discharge PSYCHIATRIC: Denies feeling of depression or anxiety. No report of hallucinations, insomnia, paranoia, or agitation   PHYSICAL EXAMINATION  GENERAL APPEARANCE: Well nourished. In no acute distress. Normal body habitus SKIN:  Left knee surgical incision has steri-strip and dry dressing HEAD: Normal in size and contour. No evidence of trauma EYES: Lids open and close normally. No blepharitis, entropion or ectropion. PERRL. Conjunctivae are clear and sclerae are white. Lenses are without opacity EARS: Pinnae are normal. Patient hears normal voice tunes of the examiner MOUTH and THROAT: Lips are without lesions. Oral mucosa is moist and without lesions. Tongue is normal in shape, size, and color and without lesions NECK: supple, trachea midline, no neck masses, no thyroid tenderness, no thyromegaly LYMPHATICS: no LAN in the neck, no supraclavicular LAN RESPIRATORY: breathing is even & unlabored, BS CTAB CARDIAC: RRR, no murmur,no extra heart sounds, no edema GI: abdomen soft, normal BS, no masses, no tenderness, no hepatomegaly, no splenomegaly EXTREMITIES:  Able to move 4 extremities PSYCHIATRIC: Alert and oriented X 3. Affect and behavior are  appropriate  LABS/RADIOLOGY: Labs reviewed: 01/26/15  WBC 7.4 hemoglobin 8.5 hematocrit 28.3 MCV 96.3 platelet 301 sodium 142 potassium 3.4 glucose 111 BUN 26 creatinine 0.86 calcium 8.1 total protein 5.2 albumin 3.2 total bilirubin 1.37 alkaline phosphatase 48 SGOT 14 SGPT 12 Basic Metabolic Panel:  Recent Labs  16/10/96 0806 01/08/15 1055 01/16/15 0435  NA 144 141 141  K 4.4 4.5 4.7  CL 105 107 110  CO2 GLUCOSE 124* 123* 133*  BUN 19 22* 31*  CREATININE 1.00 1.00 1.07*  CALCIUM 8.9 9.1 8.1*   Liver Function Tests:  Recent Labs  07/14/14 0806  AST 13  ALT 12  ALKPHOS 78  BILITOT 0.6  PROT 6.3  ALBUMIN 4.1   CBC:  Recent Labs  04/14/14 0817 07/14/14 0806  01/16/15 0435 01/17/15 0441 01/18/15 0517  WBC 6.0 5.6  < > 12.6* 8.9 8.8  NEUTROABS 4.0 3.7  --   --   --   --  HGB 14.0  --   < > 9.7* 9.0* 8.6*  HCT 42.0 40.4  < > 29.8* 27.7* 25.0*  MCV 88  --   < > 90.6 92.3 90.3  PLT  --   --   < > 156 136* 144*  < > = values in this interval not displayed.  Lipid Panel:  Recent Labs  04/14/14 0817  HDL 25*   CBG:  Recent Labs  01/16/15 0739 01/17/15 0744 01/18/15 0728  GLUCAP 126* 112* 135*     ASSESSMENT/PLAN:  Osteoarthritis S/P left total knee arthroplasty - home health PT and OT; WBAT; continue aspirin EC 325 mg 1 tab by mouth twice a day for 3 more weeks for DVT prophylaxis; baclofen 10 mg 1 tab by mouth 3 times a day for muscle spasm; Percocet 5/325 mg 1-2 tabs by mouth every 4 hours when necessary for pain; follow-up with Dr. Thomasena Edisollins, orthopedic surgeon  History of stroke -  Plavix 75 mg 1 tab by mouth daily is on hold till ASA is done   Hypertension - continue carvedilol 12.5 mg 1 tab by mouth twice a day  Seasonal allergies - continue fluticasone 50 g/ACT 1-2 sprays into both nostrils daily when necessary  Vitamin D deficiency - continue vitamin D 2 400 units 1 tab by mouth twice a day  Edema - HCTZ 25 mg 1 tab by mouth daily  when necessary  Anemia, acute blood loss - hemoglobin 8.5; stable; Plavix on hold till ASA is completed  Chronic kidney disease stage III - creatinine 0.86; improved     I have filled out patient's discharge paperwork and written prescriptions.  Patient will receive home health PT and OT.Marland Kitchen.  Total discharge time: Greater than 30 minutes  Discharge time involved coordination of the discharge process with social worker, nursing staff and therapy department. Medical justification for home health services verified.     [facility],[place], NP [facility] [phone]

## 2015-02-10 DIAGNOSIS — Z96652 Presence of left artificial knee joint: Secondary | ICD-10-CM | POA: Diagnosis not present

## 2015-02-10 DIAGNOSIS — N183 Chronic kidney disease, stage 3 (moderate): Secondary | ICD-10-CM | POA: Diagnosis not present

## 2015-02-10 DIAGNOSIS — R7303 Prediabetes: Secondary | ICD-10-CM | POA: Diagnosis not present

## 2015-02-10 DIAGNOSIS — Z7982 Long term (current) use of aspirin: Secondary | ICD-10-CM | POA: Diagnosis not present

## 2015-02-10 DIAGNOSIS — Z79891 Long term (current) use of opiate analgesic: Secondary | ICD-10-CM | POA: Diagnosis not present

## 2015-02-10 DIAGNOSIS — I129 Hypertensive chronic kidney disease with stage 1 through stage 4 chronic kidney disease, or unspecified chronic kidney disease: Secondary | ICD-10-CM | POA: Diagnosis not present

## 2015-02-10 DIAGNOSIS — Z471 Aftercare following joint replacement surgery: Secondary | ICD-10-CM | POA: Diagnosis not present

## 2015-02-10 DIAGNOSIS — E785 Hyperlipidemia, unspecified: Secondary | ICD-10-CM | POA: Diagnosis not present

## 2015-02-10 DIAGNOSIS — I69351 Hemiplegia and hemiparesis following cerebral infarction affecting right dominant side: Secondary | ICD-10-CM | POA: Diagnosis not present

## 2015-02-10 DIAGNOSIS — M47814 Spondylosis without myelopathy or radiculopathy, thoracic region: Secondary | ICD-10-CM | POA: Diagnosis not present

## 2015-02-15 DIAGNOSIS — N183 Chronic kidney disease, stage 3 (moderate): Secondary | ICD-10-CM | POA: Diagnosis not present

## 2015-02-15 DIAGNOSIS — Z471 Aftercare following joint replacement surgery: Secondary | ICD-10-CM | POA: Diagnosis not present

## 2015-02-15 DIAGNOSIS — M47814 Spondylosis without myelopathy or radiculopathy, thoracic region: Secondary | ICD-10-CM | POA: Diagnosis not present

## 2015-02-15 DIAGNOSIS — I129 Hypertensive chronic kidney disease with stage 1 through stage 4 chronic kidney disease, or unspecified chronic kidney disease: Secondary | ICD-10-CM | POA: Diagnosis not present

## 2015-02-15 DIAGNOSIS — Z96652 Presence of left artificial knee joint: Secondary | ICD-10-CM | POA: Diagnosis not present

## 2015-02-15 DIAGNOSIS — Z79891 Long term (current) use of opiate analgesic: Secondary | ICD-10-CM | POA: Diagnosis not present

## 2015-02-15 DIAGNOSIS — E785 Hyperlipidemia, unspecified: Secondary | ICD-10-CM | POA: Diagnosis not present

## 2015-02-15 DIAGNOSIS — Z7982 Long term (current) use of aspirin: Secondary | ICD-10-CM | POA: Diagnosis not present

## 2015-02-15 DIAGNOSIS — I69351 Hemiplegia and hemiparesis following cerebral infarction affecting right dominant side: Secondary | ICD-10-CM | POA: Diagnosis not present

## 2015-02-15 DIAGNOSIS — R7303 Prediabetes: Secondary | ICD-10-CM | POA: Diagnosis not present

## 2015-02-15 DIAGNOSIS — H34831 Tributary (branch) retinal vein occlusion, right eye, with macular edema: Secondary | ICD-10-CM | POA: Diagnosis not present

## 2015-02-16 DIAGNOSIS — Z471 Aftercare following joint replacement surgery: Secondary | ICD-10-CM | POA: Diagnosis not present

## 2015-02-16 DIAGNOSIS — I129 Hypertensive chronic kidney disease with stage 1 through stage 4 chronic kidney disease, or unspecified chronic kidney disease: Secondary | ICD-10-CM | POA: Diagnosis not present

## 2015-02-16 DIAGNOSIS — Z7982 Long term (current) use of aspirin: Secondary | ICD-10-CM | POA: Diagnosis not present

## 2015-02-16 DIAGNOSIS — M47814 Spondylosis without myelopathy or radiculopathy, thoracic region: Secondary | ICD-10-CM | POA: Diagnosis not present

## 2015-02-16 DIAGNOSIS — N183 Chronic kidney disease, stage 3 (moderate): Secondary | ICD-10-CM | POA: Diagnosis not present

## 2015-02-16 DIAGNOSIS — R7303 Prediabetes: Secondary | ICD-10-CM | POA: Diagnosis not present

## 2015-02-16 DIAGNOSIS — Z96652 Presence of left artificial knee joint: Secondary | ICD-10-CM | POA: Diagnosis not present

## 2015-02-16 DIAGNOSIS — Z79891 Long term (current) use of opiate analgesic: Secondary | ICD-10-CM | POA: Diagnosis not present

## 2015-02-16 DIAGNOSIS — I69351 Hemiplegia and hemiparesis following cerebral infarction affecting right dominant side: Secondary | ICD-10-CM | POA: Diagnosis not present

## 2015-02-16 DIAGNOSIS — E785 Hyperlipidemia, unspecified: Secondary | ICD-10-CM | POA: Diagnosis not present

## 2015-02-17 DIAGNOSIS — E785 Hyperlipidemia, unspecified: Secondary | ICD-10-CM | POA: Diagnosis not present

## 2015-02-17 DIAGNOSIS — I69351 Hemiplegia and hemiparesis following cerebral infarction affecting right dominant side: Secondary | ICD-10-CM | POA: Diagnosis not present

## 2015-02-17 DIAGNOSIS — N183 Chronic kidney disease, stage 3 (moderate): Secondary | ICD-10-CM | POA: Diagnosis not present

## 2015-02-17 DIAGNOSIS — Z7982 Long term (current) use of aspirin: Secondary | ICD-10-CM | POA: Diagnosis not present

## 2015-02-17 DIAGNOSIS — I129 Hypertensive chronic kidney disease with stage 1 through stage 4 chronic kidney disease, or unspecified chronic kidney disease: Secondary | ICD-10-CM | POA: Diagnosis not present

## 2015-02-17 DIAGNOSIS — Z79891 Long term (current) use of opiate analgesic: Secondary | ICD-10-CM | POA: Diagnosis not present

## 2015-02-17 DIAGNOSIS — M47814 Spondylosis without myelopathy or radiculopathy, thoracic region: Secondary | ICD-10-CM | POA: Diagnosis not present

## 2015-02-17 DIAGNOSIS — Z96652 Presence of left artificial knee joint: Secondary | ICD-10-CM | POA: Diagnosis not present

## 2015-02-17 DIAGNOSIS — R7303 Prediabetes: Secondary | ICD-10-CM | POA: Diagnosis not present

## 2015-02-17 DIAGNOSIS — Z471 Aftercare following joint replacement surgery: Secondary | ICD-10-CM | POA: Diagnosis not present

## 2015-02-18 DIAGNOSIS — M47814 Spondylosis without myelopathy or radiculopathy, thoracic region: Secondary | ICD-10-CM | POA: Diagnosis not present

## 2015-02-18 DIAGNOSIS — E785 Hyperlipidemia, unspecified: Secondary | ICD-10-CM | POA: Diagnosis not present

## 2015-02-18 DIAGNOSIS — Z471 Aftercare following joint replacement surgery: Secondary | ICD-10-CM | POA: Diagnosis not present

## 2015-02-18 DIAGNOSIS — Z79891 Long term (current) use of opiate analgesic: Secondary | ICD-10-CM | POA: Diagnosis not present

## 2015-02-18 DIAGNOSIS — I69351 Hemiplegia and hemiparesis following cerebral infarction affecting right dominant side: Secondary | ICD-10-CM | POA: Diagnosis not present

## 2015-02-18 DIAGNOSIS — N183 Chronic kidney disease, stage 3 (moderate): Secondary | ICD-10-CM | POA: Diagnosis not present

## 2015-02-18 DIAGNOSIS — Z7982 Long term (current) use of aspirin: Secondary | ICD-10-CM | POA: Diagnosis not present

## 2015-02-18 DIAGNOSIS — I129 Hypertensive chronic kidney disease with stage 1 through stage 4 chronic kidney disease, or unspecified chronic kidney disease: Secondary | ICD-10-CM | POA: Diagnosis not present

## 2015-02-18 DIAGNOSIS — Z96652 Presence of left artificial knee joint: Secondary | ICD-10-CM | POA: Diagnosis not present

## 2015-02-18 DIAGNOSIS — R7303 Prediabetes: Secondary | ICD-10-CM | POA: Diagnosis not present

## 2015-02-22 DIAGNOSIS — M47814 Spondylosis without myelopathy or radiculopathy, thoracic region: Secondary | ICD-10-CM | POA: Diagnosis not present

## 2015-02-22 DIAGNOSIS — Z471 Aftercare following joint replacement surgery: Secondary | ICD-10-CM | POA: Diagnosis not present

## 2015-02-22 DIAGNOSIS — Z7982 Long term (current) use of aspirin: Secondary | ICD-10-CM | POA: Diagnosis not present

## 2015-02-22 DIAGNOSIS — N183 Chronic kidney disease, stage 3 (moderate): Secondary | ICD-10-CM | POA: Diagnosis not present

## 2015-02-22 DIAGNOSIS — I69351 Hemiplegia and hemiparesis following cerebral infarction affecting right dominant side: Secondary | ICD-10-CM | POA: Diagnosis not present

## 2015-02-22 DIAGNOSIS — E785 Hyperlipidemia, unspecified: Secondary | ICD-10-CM | POA: Diagnosis not present

## 2015-02-22 DIAGNOSIS — R7303 Prediabetes: Secondary | ICD-10-CM | POA: Diagnosis not present

## 2015-02-22 DIAGNOSIS — I129 Hypertensive chronic kidney disease with stage 1 through stage 4 chronic kidney disease, or unspecified chronic kidney disease: Secondary | ICD-10-CM | POA: Diagnosis not present

## 2015-02-22 DIAGNOSIS — Z96652 Presence of left artificial knee joint: Secondary | ICD-10-CM | POA: Diagnosis not present

## 2015-02-22 DIAGNOSIS — Z79891 Long term (current) use of opiate analgesic: Secondary | ICD-10-CM | POA: Diagnosis not present

## 2015-02-23 DIAGNOSIS — Z7982 Long term (current) use of aspirin: Secondary | ICD-10-CM | POA: Diagnosis not present

## 2015-02-23 DIAGNOSIS — I69351 Hemiplegia and hemiparesis following cerebral infarction affecting right dominant side: Secondary | ICD-10-CM | POA: Diagnosis not present

## 2015-02-23 DIAGNOSIS — Z471 Aftercare following joint replacement surgery: Secondary | ICD-10-CM | POA: Diagnosis not present

## 2015-02-23 DIAGNOSIS — N183 Chronic kidney disease, stage 3 (moderate): Secondary | ICD-10-CM | POA: Diagnosis not present

## 2015-02-23 DIAGNOSIS — M47814 Spondylosis without myelopathy or radiculopathy, thoracic region: Secondary | ICD-10-CM | POA: Diagnosis not present

## 2015-02-23 DIAGNOSIS — R7303 Prediabetes: Secondary | ICD-10-CM | POA: Diagnosis not present

## 2015-02-23 DIAGNOSIS — Z79891 Long term (current) use of opiate analgesic: Secondary | ICD-10-CM | POA: Diagnosis not present

## 2015-02-23 DIAGNOSIS — I129 Hypertensive chronic kidney disease with stage 1 through stage 4 chronic kidney disease, or unspecified chronic kidney disease: Secondary | ICD-10-CM | POA: Diagnosis not present

## 2015-02-23 DIAGNOSIS — Z96652 Presence of left artificial knee joint: Secondary | ICD-10-CM | POA: Diagnosis not present

## 2015-02-23 DIAGNOSIS — E785 Hyperlipidemia, unspecified: Secondary | ICD-10-CM | POA: Diagnosis not present

## 2015-02-25 DIAGNOSIS — N183 Chronic kidney disease, stage 3 (moderate): Secondary | ICD-10-CM | POA: Diagnosis not present

## 2015-02-25 DIAGNOSIS — M47814 Spondylosis without myelopathy or radiculopathy, thoracic region: Secondary | ICD-10-CM | POA: Diagnosis not present

## 2015-02-25 DIAGNOSIS — I129 Hypertensive chronic kidney disease with stage 1 through stage 4 chronic kidney disease, or unspecified chronic kidney disease: Secondary | ICD-10-CM | POA: Diagnosis not present

## 2015-02-25 DIAGNOSIS — Z96652 Presence of left artificial knee joint: Secondary | ICD-10-CM | POA: Diagnosis not present

## 2015-02-25 DIAGNOSIS — Z79891 Long term (current) use of opiate analgesic: Secondary | ICD-10-CM | POA: Diagnosis not present

## 2015-02-25 DIAGNOSIS — R7303 Prediabetes: Secondary | ICD-10-CM | POA: Diagnosis not present

## 2015-02-25 DIAGNOSIS — Z471 Aftercare following joint replacement surgery: Secondary | ICD-10-CM | POA: Diagnosis not present

## 2015-02-25 DIAGNOSIS — E785 Hyperlipidemia, unspecified: Secondary | ICD-10-CM | POA: Diagnosis not present

## 2015-02-25 DIAGNOSIS — Z7982 Long term (current) use of aspirin: Secondary | ICD-10-CM | POA: Diagnosis not present

## 2015-02-25 DIAGNOSIS — I69351 Hemiplegia and hemiparesis following cerebral infarction affecting right dominant side: Secondary | ICD-10-CM | POA: Diagnosis not present

## 2015-03-25 ENCOUNTER — Other Ambulatory Visit: Payer: Self-pay

## 2015-03-25 DIAGNOSIS — H35363 Drusen (degenerative) of macula, bilateral: Secondary | ICD-10-CM | POA: Diagnosis not present

## 2015-03-25 DIAGNOSIS — H43812 Vitreous degeneration, left eye: Secondary | ICD-10-CM | POA: Diagnosis not present

## 2015-03-25 DIAGNOSIS — H35033 Hypertensive retinopathy, bilateral: Secondary | ICD-10-CM | POA: Diagnosis not present

## 2015-03-25 DIAGNOSIS — H34831 Tributary (branch) retinal vein occlusion, right eye, with macular edema: Secondary | ICD-10-CM | POA: Diagnosis not present

## 2015-03-26 ENCOUNTER — Other Ambulatory Visit: Payer: Medicare Other

## 2015-03-26 DIAGNOSIS — I129 Hypertensive chronic kidney disease with stage 1 through stage 4 chronic kidney disease, or unspecified chronic kidney disease: Secondary | ICD-10-CM | POA: Diagnosis not present

## 2015-03-26 DIAGNOSIS — E1129 Type 2 diabetes mellitus with other diabetic kidney complication: Secondary | ICD-10-CM | POA: Diagnosis not present

## 2015-03-26 DIAGNOSIS — Z8673 Personal history of transient ischemic attack (TIA), and cerebral infarction without residual deficits: Secondary | ICD-10-CM

## 2015-03-26 DIAGNOSIS — E785 Hyperlipidemia, unspecified: Secondary | ICD-10-CM

## 2015-03-27 LAB — COMPREHENSIVE METABOLIC PANEL
ALT: 8 IU/L (ref 0–32)
AST: 9 IU/L (ref 0–40)
Albumin/Globulin Ratio: 1.7 (ref 1.1–2.5)
Albumin: 3.9 g/dL (ref 3.2–4.6)
Alkaline Phosphatase: 63 IU/L (ref 39–117)
BUN/Creatinine Ratio: 27 — ABNORMAL HIGH (ref 11–26)
BUN: 25 mg/dL (ref 10–36)
Bilirubin Total: 0.5 mg/dL (ref 0.0–1.2)
CO2: 25 mmol/L (ref 18–29)
Calcium: 8.9 mg/dL (ref 8.7–10.3)
Chloride: 102 mmol/L (ref 96–106)
Creatinine, Ser: 0.92 mg/dL (ref 0.57–1.00)
GFR calc Af Amer: 63 mL/min/{1.73_m2} (ref >59)
GFR calc non Af Amer: 55 mL/min/{1.73_m2} — ABNORMAL LOW (ref >59)
Globulin, Total: 2.3 g/dL (ref 1.5–4.5)
Glucose: 113 mg/dL — ABNORMAL HIGH (ref 65–99)
Potassium: 4.1 mmol/L (ref 3.5–5.2)
Sodium: 141 mmol/L (ref 134–144)
Total Protein: 6.2 g/dL (ref 6.0–8.5)

## 2015-03-27 LAB — CBC WITH DIFFERENTIAL/PLATELET
Basophils Absolute: 0 10*3/uL (ref 0.0–0.2)
Basos: 0 %
EOS (ABSOLUTE): 0.2 10*3/uL (ref 0.0–0.4)
Eos: 3 %
Hematocrit: 39.2 % (ref 34.0–46.6)
Hemoglobin: 12.4 g/dL (ref 11.1–15.9)
Immature Grans (Abs): 0 10*3/uL (ref 0.0–0.1)
Immature Granulocytes: 0 %
Lymphocytes Absolute: 1.2 10*3/uL (ref 0.7–3.1)
Lymphs: 24 %
MCH: 27.4 pg (ref 26.6–33.0)
MCHC: 31.6 g/dL (ref 31.5–35.7)
MCV: 87 fL (ref 79–97)
Monocytes Absolute: 0.3 10*3/uL (ref 0.1–0.9)
Monocytes: 7 %
Neutrophils Absolute: 3.3 10*3/uL (ref 1.4–7.0)
Neutrophils: 66 %
Platelets: 221 10*3/uL (ref 150–379)
RBC: 4.52 x10E6/uL (ref 3.77–5.28)
RDW: 14 % (ref 12.3–15.4)
WBC: 5 10*3/uL (ref 3.4–10.8)

## 2015-03-27 LAB — LIPID PANEL
Chol/HDL Ratio: 7.8 ratio units — ABNORMAL HIGH (ref 0.0–4.4)
Cholesterol, Total: 180 mg/dL (ref 100–199)
HDL: 23 mg/dL — ABNORMAL LOW (ref >39)
LDL Calculated: 112 mg/dL — ABNORMAL HIGH (ref 0–99)
Triglycerides: 225 mg/dL — ABNORMAL HIGH (ref 0–149)
VLDL Cholesterol Cal: 45 mg/dL — ABNORMAL HIGH (ref 5–40)

## 2015-03-27 LAB — HEMOGLOBIN A1C
Est. average glucose Bld gHb Est-mCnc: 120 mg/dL
Hgb A1c MFr Bld: 5.8 % — ABNORMAL HIGH (ref 4.8–5.6)

## 2015-03-29 ENCOUNTER — Encounter: Payer: Medicare Other | Admitting: Internal Medicine

## 2015-04-09 DIAGNOSIS — Z96652 Presence of left artificial knee joint: Secondary | ICD-10-CM | POA: Diagnosis not present

## 2015-04-09 DIAGNOSIS — Z471 Aftercare following joint replacement surgery: Secondary | ICD-10-CM | POA: Diagnosis not present

## 2015-04-12 ENCOUNTER — Ambulatory Visit (INDEPENDENT_AMBULATORY_CARE_PROVIDER_SITE_OTHER): Payer: Medicare Other | Admitting: Internal Medicine

## 2015-04-12 ENCOUNTER — Encounter: Payer: Self-pay | Admitting: Internal Medicine

## 2015-04-12 VITALS — BP 120/78 | HR 68 | Temp 97.4°F | Resp 20 | Ht 67.0 in | Wt 155.4 lb

## 2015-04-12 DIAGNOSIS — Z96652 Presence of left artificial knee joint: Secondary | ICD-10-CM | POA: Diagnosis not present

## 2015-04-12 DIAGNOSIS — M1712 Unilateral primary osteoarthritis, left knee: Secondary | ICD-10-CM

## 2015-04-12 DIAGNOSIS — D62 Acute posthemorrhagic anemia: Secondary | ICD-10-CM

## 2015-04-12 DIAGNOSIS — M1711 Unilateral primary osteoarthritis, right knee: Secondary | ICD-10-CM

## 2015-04-12 DIAGNOSIS — E782 Mixed hyperlipidemia: Secondary | ICD-10-CM

## 2015-04-12 DIAGNOSIS — Z0000 Encounter for general adult medical examination without abnormal findings: Secondary | ICD-10-CM

## 2015-04-12 MED ORDER — TRAMADOL HCL 50 MG PO TABS: 50.0000 mg | ORAL_TABLET | Freq: Two times a day (BID) | ORAL | Status: DC | PRN
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
## 2015-04-22 DIAGNOSIS — H35351 Cystoid macular degeneration, right eye: Secondary | ICD-10-CM | POA: Diagnosis not present

## 2015-04-22 DIAGNOSIS — H34831 Tributary (branch) retinal vein occlusion, right eye, with macular edema: Secondary | ICD-10-CM | POA: Diagnosis not present

## 2015-05-04 DIAGNOSIS — M25572 Pain in left ankle and joints of left foot: Secondary | ICD-10-CM | POA: Diagnosis not present

## 2015-05-04 DIAGNOSIS — Z96652 Presence of left artificial knee joint: Secondary | ICD-10-CM | POA: Diagnosis not present

## 2015-05-04 DIAGNOSIS — Z471 Aftercare following joint replacement surgery: Secondary | ICD-10-CM | POA: Diagnosis not present

## 2015-05-13 DIAGNOSIS — H34831 Tributary (branch) retinal vein occlusion, right eye, with macular edema: Secondary | ICD-10-CM | POA: Diagnosis not present

## 2015-05-14 ENCOUNTER — Emergency Department (HOSPITAL_COMMUNITY)
Admission: EM | Admit: 2015-05-14 | Discharge: 2015-05-14 | Disposition: A | Discharge status: Home / Self Care | Payer: Medicare Other | Attending: Emergency Medicine | Admitting: Emergency Medicine

## 2015-05-14 ENCOUNTER — Encounter (HOSPITAL_COMMUNITY): Payer: Self-pay

## 2015-05-14 ENCOUNTER — Emergency Department (HOSPITAL_COMMUNITY): Payer: Medicare Other

## 2015-05-14 DIAGNOSIS — W108XXA Fall (on) (from) other stairs and steps, initial encounter: Secondary | ICD-10-CM | POA: Diagnosis not present

## 2015-05-14 DIAGNOSIS — S0083XA Contusion of other part of head, initial encounter: Secondary | ICD-10-CM | POA: Diagnosis not present

## 2015-05-14 DIAGNOSIS — Z8673 Personal history of transient ischemic attack (TIA), and cerebral infarction without residual deficits: Secondary | ICD-10-CM | POA: Insufficient documentation

## 2015-05-14 DIAGNOSIS — Z8739 Personal history of other diseases of the musculoskeletal system and connective tissue: Secondary | ICD-10-CM | POA: Insufficient documentation

## 2015-05-14 DIAGNOSIS — R011 Cardiac murmur, unspecified: Secondary | ICD-10-CM | POA: Diagnosis not present

## 2015-05-14 DIAGNOSIS — Y9301 Activity, walking, marching and hiking: Secondary | ICD-10-CM | POA: Diagnosis not present

## 2015-05-14 DIAGNOSIS — E538 Deficiency of other specified B group vitamins: Secondary | ICD-10-CM | POA: Insufficient documentation

## 2015-05-14 DIAGNOSIS — E119 Type 2 diabetes mellitus without complications: Secondary | ICD-10-CM | POA: Diagnosis not present

## 2015-05-14 DIAGNOSIS — Z7902 Long term (current) use of antithrombotics/antiplatelets: Secondary | ICD-10-CM | POA: Diagnosis not present

## 2015-05-14 DIAGNOSIS — N183 Chronic kidney disease, stage 3 (moderate): Secondary | ICD-10-CM | POA: Insufficient documentation

## 2015-05-14 DIAGNOSIS — Z8619 Personal history of other infectious and parasitic diseases: Secondary | ICD-10-CM | POA: Insufficient documentation

## 2015-05-14 DIAGNOSIS — I129 Hypertensive chronic kidney disease with stage 1 through stage 4 chronic kidney disease, or unspecified chronic kidney disease: Secondary | ICD-10-CM | POA: Diagnosis not present

## 2015-05-14 DIAGNOSIS — Y998 Other external cause status: Secondary | ICD-10-CM | POA: Diagnosis not present

## 2015-05-14 DIAGNOSIS — Y9289 Other specified places as the place of occurrence of the external cause: Secondary | ICD-10-CM | POA: Diagnosis not present

## 2015-05-14 DIAGNOSIS — Z79899 Other long term (current) drug therapy: Secondary | ICD-10-CM | POA: Diagnosis not present

## 2015-05-14 DIAGNOSIS — E559 Vitamin D deficiency, unspecified: Secondary | ICD-10-CM | POA: Diagnosis not present

## 2015-05-14 DIAGNOSIS — Z7951 Long term (current) use of inhaled steroids: Secondary | ICD-10-CM | POA: Diagnosis not present

## 2015-05-14 DIAGNOSIS — W19XXXA Unspecified fall, initial encounter: Secondary | ICD-10-CM

## 2015-05-14 DIAGNOSIS — Z8669 Personal history of other diseases of the nervous system and sense organs: Secondary | ICD-10-CM | POA: Insufficient documentation

## 2015-05-14 DIAGNOSIS — S0990XA Unspecified injury of head, initial encounter: Secondary | ICD-10-CM | POA: Diagnosis not present

## 2015-05-14 MED ORDER — ACETAMINOPHEN 325 MG PO TABS
650.0000 mg | ORAL_TABLET | Freq: Once | ORAL | Status: AC
  Administered 2015-05-14: 650 mg via ORAL
  Filled 2015-05-14: qty 2

## 2015-05-14 NOTE — Discharge Instructions (Signed)
Continue taking her medications as prescribed. Follow-up with your primary care provider in 2-3 days to have your blood pressure rechecked. Return to the emergency department if symptoms worsen or new onset of fever, visual changes, lightheadedness, dizziness, shortness of breath, chest pain, dental pain, vomiting, numbness, tingling, weakness, syncope, seizure.

## 2015-05-14 NOTE — ED Notes (Signed)
Patient fell while at the physician's office today. Patient states she missed a step and fell hitting her head. Patient is on Plavix. Patient denies pain or blurred vision.

## 2015-05-14 NOTE — ED Provider Notes (Signed)
CSN: [id number]     Arrival date & time 05/14/15  1023 History   First MD Initiated Contact with Patient 05/14/15 1125     Chief Complaint  Patient presents with  . Fall  . Head Injury     (Consider location/radiation/quality/duration/timing/severity/associated sxs/prior Treatment) HPI   Patient is a 82 year old female with past medical history of CKD, hypertension and CVA (on Plavix) who presents to the ED status post fall, onset prior to arrival. Patient reports while she was walking up to go to physical therapy this morning she missed a step resulting in her falling and landing on her right side and hitting her right forehead on the cement ground. Denies LOC. Patient endorses having mild aching headache to her right forehead, denies vein or alleviating symptoms. Denies fever, chills, body aches, lightheadedness, dizziness, visual changes, shortness of breath, chest pain, cough, abdominal pain, nausea, vomiting, diarrhea, urinary symptoms, numbness, tingling, weakness, neck or back pain. Patient reports she lives at home alone.  Past Medical History  Diagnosis Date  . Unspecified vitamin D deficiency   . Hyperlipidemia LDL goal < 100   . Dermatophytosis of groin and perianal area   . Cervicalgia   . Insomnia, unspecified   . Chronic kidney disease, stage III (moderate)   . Other abnormal glucose   . Other B-complex deficiencies   . Other malaise and fatigue   . Senile cataract, unspecified   . Unspecified essential hypertension   . Muscle weakness (generalized)   . Tachycardia, unspecified   . Palpitations   . Undiagnosed cardiac murmurs   . Shortness of breath   . Stroke [facility])     small "stroke" - right sided weakness for a day.  . Type II or unspecified type diabetes mellitus with renal manifestations, not stated as uncontrolled     "prediabetes"-no meds.  . Osteoarthrosis, unspecified whether generalized or localized, lower leg     osteoarthritis - upper thoracic area, left  knee.   Past Surgical History  Procedure Laterality Date  . Tonsillectomy  1944  . Tubal ligation  1951  . Toe amputation  2002  . Cataract extraction, bilateral Bilateral   . Eye surgery      cataract  . Tonsillectomy      younger yrs  . Total knee arthroplasty Left 01/15/2015    Procedure: LEFT TOTAL KNEE ARTHROPLASTY;  Surgeon: Eugenia Mcalpine, MD;  Location: [facility];  Service: Orthopedics;  Laterality: Left;   Family History  Problem Relation Age of Onset  . Cerebral aneurysm Mother   . Heart attack Father    Social History  Substance Use Topics  . Smoking status: Never Smoker   . Smokeless tobacco: Never Used  . Alcohol Use: Yes     Comment: rare social every 3 or 4 months wine   OB History    No data available     Review of Systems  Skin: Positive for wound (hematoma).  Neurological: Positive for headaches.  All other systems reviewed and are negative.     Allergies  Ace inhibitors and Simvastatin  Home Medications   Prior to Admission medications   Medication Sig Start Date End Date Taking? Authorizing Provider  b complex vitamins tablet Take 1 tablet by mouth daily.   Yes Historical Provider, MD  carvedilol (COREG) 12.5 MG tablet Take 1 tablet (12.5 mg total) by mouth 2 (two) times daily with a meal. For blood pressure 11/16/14  Yes Tiffany L Reed, DO  clopidogrel (PLAVIX) 75  MG tablet Take 1 tablet (75 mg total) by mouth daily with breakfast. 11/16/14  Yes Tiffany L Reed, DO  Ergocalciferol (VITAMIN D2) 400 UNITS TABS Take 1 tablet by mouth daily.    Yes Historical Provider, MD  fluticasone (FLONASE) 50 MCG/ACT nasal spray Place 2 sprays into both nostrils daily as needed for allergies or rhinitis.    Yes Historical Provider, MD  hydrochlorothiazide (HYDRODIURIL) 25 MG tablet Take 1 tablet (25 mg total) by mouth daily as needed. Swelling Patient taking differently: Take 25 mg by mouth daily as needed (swelling/ fluid). Swelling 11/16/14  Yes Tiffany L Reed, DO    traMADol (ULTRAM) 50 MG tablet Take 1 tablet (50 mg total) by mouth 2 (two) times daily as needed. Patient taking differently: Take 50 mg by mouth 2 (two) times daily as needed for moderate pain.  04/12/15  Yes Tiffany L Reed, DO  baclofen (LIORESAL) 10 MG tablet Take 1 tablet (10 mg total) by mouth 3 (three) times daily. Patient not taking: Reported on 05/14/2015 01/15/15   Aggie Hacker L Stilwell, PA-C   BP 204/83 mmHg  Pulse 66  Temp(Src) 98.2 F (36.8 C) (Oral)  Resp 16  Ht  (1.702 m)  Wt 70.308 kg  BMI 24.27 kg/m2  SpO2 97% Physical Exam  Constitutional: She is oriented to person, place, and time. She appears well-developed and well-nourished. No distress.  HENT:  Head: Normocephalic. Head is without raccoon's eyes, without Battle's sign, without abrasion and without laceration.    Right Ear: Tympanic membrane normal. No hemotympanum.  Left Ear: Tympanic membrane normal. No hemotympanum.  Nose: Nose normal. No nasal deformity, septal deviation or nasal septal hematoma. No epistaxis. Right sinus exhibits no maxillary sinus tenderness and no frontal sinus tenderness. Left sinus exhibits no maxillary sinus tenderness and no frontal sinus tenderness.  Mouth/Throat: Uvula is midline, oropharynx is clear and moist and mucous membranes are normal. No oropharyngeal exudate.  Eyes: Conjunctivae and EOM are normal. Pupils are equal, round, and reactive to light. Right eye exhibits no discharge. Left eye exhibits no discharge. No scleral icterus.  Neck: Normal range of motion. Neck supple.  Cardiovascular: Normal rate, regular rhythm, normal heart sounds and intact distal pulses.   Pulses:      Radial pulses are 2+ on the right side, and 2+ on the left side.       Dorsalis pedis pulses are 2+ on the right side, and 2+ on the left side.  Pulmonary/Chest: Effort normal and breath sounds normal. No respiratory distress. She has no wheezes. She has no rales. She exhibits no tenderness.   Abdominal: Soft. Bowel sounds are normal. She exhibits no distension and no mass. There is no tenderness. There is no rebound and no guarding.  Musculoskeletal: Normal range of motion. She exhibits no edema or tenderness.  Lymphadenopathy:    She has no cervical adenopathy.  Neurological: She is alert and oriented to person, place, and time. She has normal strength. No cranial nerve deficit or sensory deficit. Coordination normal.  Skin: Skin is warm and dry. She is not diaphoretic.  Nursing note and vitals reviewed.   ED Course  Procedures (including critical care time) Labs Review Labs Reviewed - No data to display  Imaging Review Ct Head Wo Contrast  05/14/2015  CLINICAL DATA:  Status post fall today with a blow to the head. Initial encounter. EXAM: CT HEAD WITHOUT CONTRAST TECHNIQUE: Contiguous axial images were obtained from the base of the skull through the vertex  without intravenous contrast. COMPARISON:  Head CT scan 05/07/2013. FINDINGS: There is some cortical atrophy and chronic microvascular ischemic change. No evidence of acute intracranial abnormality including hemorrhage, infarct, mass lesion, mass effect, midline shift or abnormal extra-axial fluid collection. No hydrocephalus or pneumocephalus. The calvarium is intact. IMPRESSION: No acute abnormality. Atrophy and chronic microvascular ischemic change. Electronically Signed   By: Drusilla Kanner M.D.   On: 05/14/2015 13:26   I have personally reviewed and evaluated these images and lab results as part of my medical decision-making.   EKG Interpretation None      MDM   Final diagnoses:  Fall, initial encounter  Head injury, initial encounter    Pt presents s/p mechanical fall with reported head injury, denies LOC. Pt is on plavix. VSS. Exam revealed small hematoma noted to right forehead, no other signs of injury, CNs grossly intact, no neuro deficits. Pt given tylenol for mild headache. CT head negative. Pt able to  stand and ambulate without assistance, no ataxia noted. Discussed results and plan for d/c with pt. Prior to d/c pt's BP was 204/83. Pt denies headache, visual changes, lightheadedness, dizziness, chest pain, SOB, abdominal pain, numbness, tingling, weakness. Pt reports her BP goes up and down and will intermittently be this high at baseline. I advised pt to follow up with her PCP in 2-3 days to have her BP rechecked or to return to the ED if she begins to have any new sxs/pain.      Satira Sark [place], [place] 05/15/15 2130  Mancel Bale, MD 05/15/15 [phone]

## 2015-05-14 NOTE — ED Provider Notes (Signed)
  Face-to-face evaluation   History: Patient had a mechanical fall today injuring her forehead. She is on Plavix.  Physical exam: Alert, elderly female with contusion, forehead. She is calm, cooperative, and lucid. She was all extremities equally. Normal range of motion of the neck.    Medical screening examination/treatment/procedure(s) were conducted as a shared visit with non-physician practitioner(s) and myself.  I personally evaluated the patient during the encounter   Mancel Bale, MD 05/15/15 1517

## 2015-05-14 NOTE — ED Notes (Signed)
Patient transported to CT 

## 2015-05-14 NOTE — ED Notes (Addendum)
Pt states "I am ready to go there's nothing wrong with me."  Addendum: My BP jumps up and down like a window shade. 207/73

## 2015-05-18 DIAGNOSIS — H04123 Dry eye syndrome of bilateral lacrimal glands: Secondary | ICD-10-CM | POA: Diagnosis not present

## 2015-05-18 DIAGNOSIS — H353130 Nonexudative age-related macular degeneration, bilateral, stage unspecified: Secondary | ICD-10-CM | POA: Diagnosis not present

## 2015-06-07 ENCOUNTER — Ambulatory Visit (INDEPENDENT_AMBULATORY_CARE_PROVIDER_SITE_OTHER): Payer: Medicare Other | Admitting: Internal Medicine

## 2015-06-07 ENCOUNTER — Encounter: Payer: Self-pay | Admitting: Internal Medicine

## 2015-06-07 VITALS — BP 160/80 | HR 62 | Temp 97.5°F | Wt 152.0 lb

## 2015-06-07 DIAGNOSIS — R300 Dysuria: Secondary | ICD-10-CM | POA: Diagnosis not present

## 2015-06-07 DIAGNOSIS — N3000 Acute cystitis without hematuria: Secondary | ICD-10-CM | POA: Diagnosis not present

## 2015-06-07 LAB — POCT URINALYSIS DIPSTICK
Bilirubin, UA: NEGATIVE
Glucose, UA: NEGATIVE
Ketones, UA: NEGATIVE
Nitrite, UA: NEGATIVE
Protein, UA: 30
Spec Grav, UA: 1.015
Urobilinogen, UA: 0.2
pH, UA: 5

## 2015-06-07 MED ORDER — AMOXICILLIN 500 MG PO CAPS: 500.0000 mg | ORAL_CAPSULE | Freq: Three times a day (TID) | ORAL | Status: DC

## 2015-06-07 NOTE — Patient Instructions (Signed)

## 2015-06-07 NOTE — Progress Notes (Signed)
Patient ID: Lisa [name], female   DOB: 02-06-1922, 82 y.o.   MRN: [id number]   Location:  [facility] clinic Provider: Suzane Vanderweide L. Renato Gails, D.O., C.M.D.  Code Status: DNR Goals of Care:  Advanced Directives 05/14/2015  Does patient have an advance directive? Yes  Type of Estate agent of [place];Living will  Does patient want to make changes to advanced directive? No - Patient declined  Copy of advanced directive(s) in chart? No - copy requested  Would patient like information on creating an advanced directive? No - patient declined information     Chief Complaint  Patient presents with  . Acute Visit    UTI, burning when urinating    HPI: Patient is a 82 y.o. female seen today for an acute visit for frequency, dysuria, no suprapubic pain, doesn't tend to run fevers, no chills, also has had urgency.  Has been using pad to prevent from wetting herself.  Has tried to drink more water.  Doesn't get thirsty and doesn't think about water.    Past Medical History  Diagnosis Date  . Unspecified vitamin D deficiency   . Hyperlipidemia LDL goal < 100   . Dermatophytosis of groin and perianal area   . Cervicalgia   . Insomnia, unspecified   . Chronic kidney disease, stage III (moderate)   . Other abnormal glucose   . Other B-complex deficiencies   . Other malaise and fatigue   . Senile cataract, unspecified   . Unspecified essential hypertension   . Muscle weakness (generalized)   . Tachycardia, unspecified   . Palpitations   . Undiagnosed cardiac murmurs   . Shortness of breath   . Stroke [facility])     small "stroke" - right sided weakness for a day.  . Type II or unspecified type diabetes mellitus with renal manifestations, not stated as uncontrolled     "prediabetes"-no meds.  . Osteoarthrosis, unspecified whether generalized or localized, lower leg     osteoarthritis - upper thoracic area, left knee.    Past Surgical History  Procedure Laterality Date  .  Tonsillectomy  1944  . Tubal ligation  1951  . Toe amputation  2002  . Cataract extraction, bilateral Bilateral   . Eye surgery      cataract  . Tonsillectomy      younger yrs  . Total knee arthroplasty Left 01/15/2015    Procedure: LEFT TOTAL KNEE ARTHROPLASTY;  Surgeon: Eugenia Mcalpine, MD;  Location: [facility];  Service: Orthopedics;  Laterality: Left;    Allergies  Allergen Reactions  . Ace Inhibitors Cough  . Simvastatin Other (See Comments)    Body aches and pains      Medication List       This list is accurate as of: 06/07/15 12:15 PM.  Always use your most recent med list.               b complex vitamins tablet  Take 1 tablet by mouth daily.     carvedilol 12.5 MG tablet  Commonly known as:  COREG  Take 1 tablet (12.5 mg total) by mouth 2 (two) times daily with a meal. For blood pressure     clopidogrel 75 MG tablet  Commonly known as:  PLAVIX  Take 1 tablet (75 mg total) by mouth daily with breakfast.     fluticasone 50 MCG/ACT nasal spray  Commonly known as:  FLONASE  Place 2 sprays into both nostrils daily as needed for allergies or rhinitis.  hydrochlorothiazide 25 MG tablet  Commonly known as:  HYDRODIURIL  Take 25 mg by mouth daily.     traMADol 50 MG tablet  Commonly known as:  ULTRAM  Take 50 mg by mouth 2 (two) times daily as needed.     Vitamin D2 400 units Tabs  Take 1 tablet by mouth daily.        Review of Systems:  Review of Systems  Constitutional: Positive for malaise/fatigue. Negative for fever and chills.  HENT: Negative for congestion.   Respiratory: Negative for shortness of breath.   Cardiovascular: Negative for chest pain and palpitations.  Gastrointestinal: Negative for abdominal pain, constipation, blood in stool and melena.  Genitourinary: Positive for dysuria, urgency and frequency. Negative for hematuria and flank pain.  Musculoskeletal: Negative for myalgias, joint pain and falls.  Neurological: Negative for  weakness and headaches.       Balance poor  Endo/Heme/Allergies: Bruises/bleeds easily.  Psychiatric/Behavioral: Negative for memory loss.    Health Maintenance  Topic Date Due  . URINE MICROALBUMIN  09/10/2013  . HEMOGLOBIN A1C  09/23/2015  . INFLUENZA VACCINE  10/19/2015  . OPHTHALMOLOGY EXAM  11/09/2015  . FOOT EXAM  11/16/2015  . TETANUS/TDAP  05/21/2021  . DEXA SCAN  Addressed  . ZOSTAVAX  Completed  . PNA vac Low Risk Adult  Completed    Physical Exam: Filed Vitals:   06/07/15 1134  BP: 160/80  Pulse: 62  Temp: 97.5 F (36.4 C)  TempSrc: Oral  Weight: 152 lb (68.947 kg)  SpO2: 99%   Body mass index is 23.8 kg/(m^2). Physical Exam  Constitutional: She is oriented to person, place, and time. She appears well-developed and well-nourished. No distress.  Cardiovascular: Normal rate, regular rhythm, normal heart sounds and intact distal pulses.   Pulmonary/Chest: Effort normal and breath sounds normal. No respiratory distress.  Abdominal: Soft. Bowel sounds are normal. She exhibits no distension and no mass. There is tenderness.  Over suprapubic area  Musculoskeletal: Normal range of motion.  Neurological: She is alert and oriented to person, place, and time.  Skin: Skin is warm and dry.    Labs reviewed: Basic Metabolic Panel:  Recent Labs  11/91/4710/21/16 1055 01/16/15 0435 03/26/15 0804  NA 141 141 141  K 4.5 4.7 4.1  CL 107 110 102  CO2 26 27 25   GLUCOSE 123* 133* 113*  BUN 22* 31* 25  CREATININE 1.00 1.07* 0.92  CALCIUM 9.1 8.1* 8.9   Liver Function Tests:  Recent Labs  07/14/14 0806 03/26/15 0804  AST 13 9  ALT 12 8  ALKPHOS 78 63  BILITOT 0.6 0.5  PROT 6.3 6.2  ALBUMIN 4.1 3.9   No results for input(s): LIPASE, AMYLASE in the last 8760 hours. No results for input(s): AMMONIA in the last 8760 hours. CBC:  Recent Labs  07/14/14 0806  01/16/15 0435 01/17/15 0441 01/18/15 0517 03/26/15 0804  WBC 5.6  < > 12.6* 8.9 8.8 5.0  NEUTROABS 3.7   --   --   --   --  3.3  HGB  --   < > 9.7* 9.0* 8.6*  --   HCT 40.4  < > 29.8* 27.7* 25.0* 39.2  MCV 89  < > 90.6 92.3 90.3 87  PLT 193  < > 156 136* 144* 221  < > = values in this interval not displayed. Lipid Panel:  Recent Labs  03/26/15 0804  CHOL 180  HDL 23*  LDLCALC 112*  TRIG 225*  CHOLHDL  7.8*   Lab Results  Component Value Date   HGBA1C 5.8* 03/26/2015    Procedures since last visit: Ct Head Wo Contrast  05/14/2015  CLINICAL DATA:  Status post fall today with a blow to the head. Initial encounter. EXAM: CT HEAD WITHOUT CONTRAST TECHNIQUE: Contiguous axial images were obtained from the base of the skull through the vertex without intravenous contrast. COMPARISON:  Head CT scan 05/07/2013. FINDINGS: There is some cortical atrophy and chronic microvascular ischemic change. No evidence of acute intracranial abnormality including hemorrhage, infarct, mass lesion, mass effect, midline shift or abnormal extra-axial fluid collection. No hydrocephalus or pneumocephalus. The calvarium is intact. IMPRESSION: No acute abnormality. Atrophy and chronic microvascular ischemic change. Electronically Signed   By: Drusilla Kanner M.D.   On: 05/14/2015 13:26    Assessment/Plan 1. Dysuria - POC Urinalysis Dipstick done which was grossly positive and she is overtly symptomatic, bp also up due to pain - Urine culture sent - will start her on amoxicillin (AMOXIL) 500 MG capsule; Take 1 capsule (500 mg total) by mouth 3 (three) times daily.  Dispense: 21 capsule; Refill: 0  2. Acute cystitis without hematuria - amoxicillin (AMOXIL) 500 MG capsule; Take 1 capsule (500 mg total) by mouth 3 (three) times daily.  Dispense: 21 capsule; Refill: 0 sent in for UTI without hematuria -push po fluids and cranberry juice also -eat yogurt while on abx -let us know if not better and we will let her know final culture  Labs/tests ordered:   Orders Placed This Encounter  Procedures  . Urine culture     Order Specific Question:  Source    Answer:  clean catch  . POC Urinalysis Dipstick    Next appt:  07/05/2015 med mgt  Brittiney Dicostanzo L. Hanifah Royse, D.O. Geriatrics [facility] [address], [place] [address] Cell Phone (Mon-Fri 8am-5pm):  [phone] On Call:  [phone] & follow prompts after 5pm & weekends Office Phone:  [phone] Office Fax:  [phone]

## 2015-06-10 LAB — URINE CULTURE

## 2015-06-28 DIAGNOSIS — H34831 Tributary (branch) retinal vein occlusion, right eye, with macular edema: Secondary | ICD-10-CM | POA: Diagnosis not present

## 2015-07-05 ENCOUNTER — Encounter: Payer: Self-pay | Admitting: Internal Medicine

## 2015-07-05 ENCOUNTER — Ambulatory Visit (INDEPENDENT_AMBULATORY_CARE_PROVIDER_SITE_OTHER): Payer: Medicare Other | Admitting: Internal Medicine

## 2015-07-05 VITALS — BP 148/70 | HR 63 | Temp 97.8°F | Ht 67.0 in | Wt 151.0 lb

## 2015-07-05 DIAGNOSIS — G8929 Other chronic pain: Secondary | ICD-10-CM | POA: Diagnosis not present

## 2015-07-05 DIAGNOSIS — E782 Mixed hyperlipidemia: Secondary | ICD-10-CM

## 2015-07-05 DIAGNOSIS — I129 Hypertensive chronic kidney disease with stage 1 through stage 4 chronic kidney disease, or unspecified chronic kidney disease: Secondary | ICD-10-CM | POA: Diagnosis not present

## 2015-07-05 DIAGNOSIS — N183 Chronic kidney disease, stage 3 unspecified: Secondary | ICD-10-CM

## 2015-07-05 DIAGNOSIS — E1122 Type 2 diabetes mellitus with diabetic chronic kidney disease: Secondary | ICD-10-CM | POA: Diagnosis not present

## 2015-07-05 DIAGNOSIS — Z96652 Presence of left artificial knee joint: Secondary | ICD-10-CM | POA: Diagnosis not present

## 2015-07-05 DIAGNOSIS — M542 Cervicalgia: Secondary | ICD-10-CM

## 2015-07-05 MED ORDER — HYDROCHLOROTHIAZIDE 25 MG PO TABS: 25.0000 mg | ORAL_TABLET | Freq: Every day | ORAL | Status: DC

## 2015-07-05 MED ORDER — CARVEDILOL 12.5 MG PO TABS: 12.5000 mg | ORAL_TABLET | Freq: Two times a day (BID) | ORAL | Status: DC

## 2015-07-05 MED ORDER — CLOPIDOGREL BISULFATE 75 MG PO TABS: 75.0000 mg | ORAL_TABLET | Freq: Every day | ORAL | Status: DC

## 2015-07-05 NOTE — Progress Notes (Signed)
Patient ID: Lisa [name], female   DOB: 08-28-1921, 82 y.o.   MRN: [id number]   Location:  [facility] clinic Provider:  Morris Markham L. Renato Gails, D.O., C.M.D.  Code Status: DNR Goals of Care:  Advanced Directives 07/05/2015  Does patient have an advance directive? Yes  Type of Advance Directive Living will;Out of facility DNR (pink MOST or yellow form)  Does patient want to make changes to advanced directive? -  Copy of advanced directive(s) in chart? Yes  Would patient like information on creating an advanced directive? -  Pre-existing out of facility DNR order (yellow form or pink MOST form) Yellow form placed in chart (order not valid for inpatient use)     Chief Complaint  Patient presents with  . Medical Management of Chronic Issues    3 mth follow-up    HPI: Patient is a 82 y.o. female seen today for medical management of chronic diseases.    Had a cold, but knew it was a virus, so toughed it out.  All better now.  She was thinking she was getting a chronic cough, but, as of yesterday, it got better.    Knee is doing great.  No longer using cane.  She is getting around.  Didn't think it would ever get well, but has.  Next check up is next week for final.  Took about 6 mos.  Dr. Thomasena Edis.  Is still using tramadol--had some ankle pain on the left.  Not using even daily at this point.  Back of her neck also hurts due to arthritis.    Has her chronic dyspnea, but no change.    BP ok--less than 150/90.    Past Medical History  Diagnosis Date  . Unspecified vitamin D deficiency   . Hyperlipidemia LDL goal < 100   . Dermatophytosis of groin and perianal area   . Cervicalgia   . Insomnia, unspecified   . Chronic kidney disease, stage III (moderate)   . Other abnormal glucose   . Other B-complex deficiencies   . Other malaise and fatigue   . Senile cataract, unspecified   . Unspecified essential hypertension   . Muscle weakness (generalized)   . Tachycardia, unspecified   .  Palpitations   . Undiagnosed cardiac murmurs   . Shortness of breath   . Stroke [facility])     small "stroke" - right sided weakness for a day.  . Type II or unspecified type diabetes mellitus with renal manifestations, not stated as uncontrolled     "prediabetes"-no meds.  . Osteoarthrosis, unspecified whether generalized or localized, lower leg     osteoarthritis - upper thoracic area, left knee.    Past Surgical History  Procedure Laterality Date  . Tonsillectomy  1944  . Tubal ligation  1951  . Toe amputation  2002  . Cataract extraction, bilateral Bilateral   . Eye surgery      cataract  . Tonsillectomy      younger yrs  . Total knee arthroplasty Left 01/15/2015    Procedure: LEFT TOTAL KNEE ARTHROPLASTY;  Surgeon: Eugenia Mcalpine, MD;  Location: [facility];  Service: Orthopedics;  Laterality: Left;    Allergies  Allergen Reactions  . Ace Inhibitors Cough  . Simvastatin Other (See Comments)    Body aches and pains      Medication List       This list is accurate as of: 07/05/15 10:38 AM.  Always use your most recent med list.  b complex vitamins tablet  Take 1 tablet by mouth daily.     carvedilol 12.5 MG tablet  Commonly known as:  COREG  Take 1 tablet (12.5 mg total) by mouth 2 (two) times daily with a meal. For blood pressure     clopidogrel 75 MG tablet  Commonly known as:  PLAVIX  Take 1 tablet (75 mg total) by mouth daily with breakfast.     fluticasone 50 MCG/ACT nasal spray  Commonly known as:  FLONASE  Place 2 sprays into both nostrils daily as needed for allergies or rhinitis.     hydrochlorothiazide 25 MG tablet  Commonly known as:  HYDRODIURIL  Take 25 mg by mouth daily.     traMADol 50 MG tablet  Commonly known as:  ULTRAM  Take 50 mg by mouth 2 (two) times daily as needed.     Vitamin D2 400 units Tabs  Take 1 tablet by mouth daily.        Review of Systems:  Review of Systems  Constitutional: Negative for fever, chills and  malaise/fatigue.  HENT: Negative for congestion.   Eyes: Negative for blurred vision.  Respiratory: Negative for shortness of breath.   Cardiovascular: Positive for leg swelling. Negative for chest pain.       Off and on, uses hctz prn  Gastrointestinal: Negative for abdominal pain, constipation, blood in stool and melena.  Genitourinary: Negative for dysuria.  Musculoskeletal: Positive for neck pain. Negative for joint pain and falls.  Skin: Negative for rash.  Neurological: Negative for dizziness and weakness.  Psychiatric/Behavioral: Negative for depression and memory loss.    Health Maintenance  Topic Date Due  . URINE MICROALBUMIN  09/10/2013  . HEMOGLOBIN A1C  09/23/2015  . INFLUENZA VACCINE  10/19/2015  . OPHTHALMOLOGY EXAM  11/09/2015  . FOOT EXAM  11/16/2015  . TETANUS/TDAP  05/21/2021  . DEXA SCAN  Addressed  . ZOSTAVAX  Completed  . PNA vac Low Risk Adult  Completed    Physical Exam: Filed Vitals:   07/05/15 1030  BP: 148/70  Pulse: 63  Temp: 97.8 F (36.6 C)  TempSrc: Oral  Height: 5\' 7"  (1.702 m)  Weight: 151 lb (68.493 kg)  SpO2: 96%   Body mass index is 23.64 kg/(m^2). Physical Exam  Constitutional: She is oriented to person, place, and time. She appears well-developed and well-nourished. No distress.  Cardiovascular: Normal rate, regular rhythm, normal heart sounds and intact distal pulses.   Pulmonary/Chest: Effort normal and breath sounds normal. No respiratory distress. She has no wheezes. She has no rales.  Abdominal: Bowel sounds are normal.  Musculoskeletal: Normal range of motion. She exhibits no tenderness.  Neurological: She is alert and oriented to person, place, and time.  Skin: Skin is warm and dry.  Psychiatric: She has a normal mood and affect.    Labs reviewed: Basic Metabolic Panel:  Recent Labs  16/12/9608/21/16 1055 01/16/15 0435 03/26/15 0804  NA 141 141 141  K 4.5 4.7 4.1  CL 107 110 102  CO2 26 27 25   GLUCOSE 123* 133* 113*    BUN 22* 31* 25  CREATININE 1.00 1.07* 0.92  CALCIUM 9.1 8.1* 8.9   Liver Function Tests:  Recent Labs  07/14/14 0806 03/26/15 0804  AST 13 9  ALT 12 8  ALKPHOS 78 63  BILITOT 0.6 0.5  PROT 6.3 6.2  ALBUMIN 4.1 3.9   No results for input(s): LIPASE, AMYLASE in the last 8760 hours. No results for input(s): AMMONIA  in the last 8760 hours. CBC:  Recent Labs  07/14/14 0806  01/16/15 0435 01/17/15 0441 01/18/15 0517 03/26/15 0804  WBC 5.6  < > 12.6* 8.9 8.8 5.0  NEUTROABS 3.7  --   --   --   --  3.3  HGB  --   < > 9.7* 9.0* 8.6*  --   HCT 40.4  < > 29.8* 27.7* 25.0* 39.2  MCV 89  < > 90.6 92.3 90.3 87  PLT 193  < > 156 136* 144* 221  < > = values in this interval not displayed. Lipid Panel:  Recent Labs  03/26/15 0804  CHOL 180  HDL 23*  LDLCALC 112*  TRIG 225*  CHOLHDL 7.8*   Lab Results  Component Value Date   HGBA1C 5.8* 03/26/2015    Assessment/Plan 1. Controlled type 2 diabetes mellitus with stage 3 chronic kidney disease, without long-term current use of insulin (HCC) - cont diet and exercise with walking - refuses statin therapy and additional bp medication - cont plavix as antiplatelet - Microalbumin/Creatinine Ratio, Urine  2. Status post total left knee replacement -doing much better with this  3. Mixed hyperlipidemia - refuses statin and fibrate therapy at her age--had leg cramps -clopidogrel (PLAVIX) 75 MG tablet; Take 1 tablet (75 mg total) by mouth daily with breakfast.  Dispense: 90 tablet; Refill: 1  4. Neck pain, chronic -cont tramadol as needed for this pain  5. Renal hypertension, stage 1-4 or unspecified chronic kidney disease - urine microalbumin today if sample was adequate - carvedilol (COREG) 12.5 MG tablet; Take 1 tablet (12.5 mg total) by mouth 2 (two) times daily with a meal. For blood pressure  Dispense: 180 tablet; Refill: 1 - hydrochlorothiazide (HYDRODIURIL) 25 MG tablet; Take 1 tablet (25 mg total) by mouth daily.   Dispense: 90 tablet; Refill: 3  Labs/tests ordered:   Orders Placed This Encounter  Procedures  . Microalbumin/Creatinine Ratio, Urine   Next appt: 11/04/2015 for med mgt  Lisa [name], D.O. Geriatrics [facility] [address], [place] [address] Cell Phone (Mon-Fri 8am-5pm):  [phone] On Call:  [phone] & follow prompts after 5pm & weekends Office Phone:  [phone] Office Fax:  [phone]

## 2015-07-06 LAB — MICROALBUMIN / CREATININE URINE RATIO
Creatinine, Urine: 72.9 mg/dL
MICROALB/CREAT RATIO: 35.5 mg/g creat — ABNORMAL HIGH (ref 0.0–30.0)
Microalbumin, Urine: 25.9 ug/mL

## 2015-07-12 ENCOUNTER — Ambulatory Visit: Payer: Medicare Other | Admitting: Internal Medicine

## 2015-07-12 DIAGNOSIS — Z96652 Presence of left artificial knee joint: Secondary | ICD-10-CM | POA: Diagnosis not present

## 2015-07-12 DIAGNOSIS — Z471 Aftercare following joint replacement surgery: Secondary | ICD-10-CM | POA: Diagnosis not present

## 2015-07-26 DIAGNOSIS — H34831 Tributary (branch) retinal vein occlusion, right eye, with macular edema: Secondary | ICD-10-CM | POA: Diagnosis not present

## 2015-08-30 DIAGNOSIS — H34831 Tributary (branch) retinal vein occlusion, right eye, with macular edema: Secondary | ICD-10-CM | POA: Diagnosis not present

## 2015-10-04 DIAGNOSIS — H34831 Tributary (branch) retinal vein occlusion, right eye, with macular edema: Secondary | ICD-10-CM | POA: Diagnosis not present

## 2015-11-04 ENCOUNTER — Encounter: Payer: Self-pay | Admitting: Internal Medicine

## 2015-11-04 ENCOUNTER — Ambulatory Visit (INDEPENDENT_AMBULATORY_CARE_PROVIDER_SITE_OTHER): Payer: Medicare Other | Admitting: Internal Medicine

## 2015-11-04 VITALS — BP 176/92 | HR 64 | Temp 97.4°F | Resp 20 | Ht 67.0 in | Wt 153.0 lb

## 2015-11-04 DIAGNOSIS — N183 Chronic kidney disease, stage 3 unspecified: Secondary | ICD-10-CM

## 2015-11-04 DIAGNOSIS — R0602 Shortness of breath: Secondary | ICD-10-CM | POA: Diagnosis not present

## 2015-11-04 DIAGNOSIS — I129 Hypertensive chronic kidney disease with stage 1 through stage 4 chronic kidney disease, or unspecified chronic kidney disease: Secondary | ICD-10-CM

## 2015-11-04 DIAGNOSIS — E782 Mixed hyperlipidemia: Secondary | ICD-10-CM

## 2015-11-04 DIAGNOSIS — M1711 Unilateral primary osteoarthritis, right knee: Secondary | ICD-10-CM | POA: Diagnosis not present

## 2015-11-04 DIAGNOSIS — E1122 Type 2 diabetes mellitus with diabetic chronic kidney disease: Secondary | ICD-10-CM

## 2015-11-04 MED ORDER — CARVEDILOL 12.5 MG PO TABS
12.5000 mg | ORAL_TABLET | Freq: Two times a day (BID) | ORAL | 3 refills | Status: DC
Start: 2015-11-04 — End: 2016-04-17

## 2015-11-04 MED ORDER — CLOPIDOGREL BISULFATE 75 MG PO TABS: 75.0000 mg | ORAL_TABLET | Freq: Every day | ORAL | 3 refills | Status: DC

## 2015-11-04 MED ORDER — TRAMADOL HCL 50 MG PO TABS: 50.0000 mg | ORAL_TABLET | Freq: Two times a day (BID) | ORAL | 1 refills | Status: DC | PRN

## 2015-11-04 NOTE — Progress Notes (Signed)
[facility]  [facility] Provider:  Theone Bowell L. Renato Gailseed, D.O., C.M.D.  Code Status: DNR Goals of Care:  Advanced Directives 11/04/2015  Does patient have an advance directive? Yes  Type of Advance Directive Living will;Out of facility DNR (pink MOST or yellow form)  Does patient want to make changes to advanced directive? -  Copy of advanced directive(s) in chart? Yes  Would patient like information on creating an advanced directive? -  Pre-existing out of facility DNR order (yellow form or pink MOST form) -     Chief Complaint  Patient presents with  . Medical Management of Chronic Issues    4 month medication management    HPI: Patient is a 82 y.o. female seen today for medical management of chronic diseases.    She got a cane for her birthday from her son.  She feels better to use it.  Has the rubber base cane.    Right knee and arthritis of her neck.  Not going to have the right replaced.  Therapy was long and hard after the left.    Feels like she doesn't have strength enough to do anything.  Gets short-winded and has to sit down.  Her 80 yo sister can keep going a lot longer than she can.  No chest pain.  No palpitations.  Oxygen levels are normal.  She doesn't think it's a lot worse or want to worry about it.  [place] then to watch her great granddaughter win some gymnastics programs.    Had echo with EF 60-65% back in 2015.    Admits she does sit more than she did and thinks she is deconditioned from that to some degree.    Still dealing with occlusion in her eye--swelling has not gone down completely.  Shots keep it under control.    Past Medical History:  Diagnosis Date  . Cervicalgia   . Chronic kidney disease, stage III (moderate)   . Dermatophytosis of groin and perianal area   . Hyperlipidemia LDL goal < 100   . Insomnia, unspecified   . Muscle weakness (generalized)   . Osteoarthrosis, unspecified whether generalized or localized, lower leg      osteoarthritis - upper thoracic area, left knee.  . Other abnormal glucose   . Other B-complex deficiencies   . Other malaise and fatigue   . Palpitations   . Senile cataract, unspecified   . Shortness of breath   . Stroke [facility](HCC)    small "stroke" - right sided weakness for a day.  . Tachycardia, unspecified   . Type II or unspecified type diabetes mellitus with renal manifestations, not stated as uncontrolled    "prediabetes"-no meds.  . Undiagnosed cardiac murmurs   . Unspecified essential hypertension   . Unspecified vitamin D deficiency     Past Surgical History:  Procedure Laterality Date  . CATARACT EXTRACTION, BILATERAL Bilateral   . EYE SURGERY     cataract  . TOE AMPUTATION  2002  . TONSILLECTOMY  1944  . TONSILLECTOMY     younger yrs  . TOTAL KNEE ARTHROPLASTY Left 01/15/2015   Procedure: LEFT TOTAL KNEE ARTHROPLASTY;  Surgeon: Eugenia Mcalpineobert Collins, MD;  Location: [facility];  Service: Orthopedics;  Laterality: Left;  . TUBAL LIGATION  1951    Allergies  Allergen Reactions  . Ace Inhibitors Cough  . Simvastatin Other (See Comments)    Body aches and pains      Medication List  Accurate as of 11/04/15 10:18 AM. Always use your most recent med list.          b complex vitamins tablet Take 1 tablet by mouth daily.   carvedilol 12.5 MG tablet Commonly known as:  COREG Take 1 tablet (12.5 mg total) by mouth 2 (two) times daily with a meal. For blood pressure   clopidogrel 75 MG tablet Commonly known as:  PLAVIX Take 1 tablet (75 mg total) by mouth daily with breakfast.   fluticasone 50 MCG/ACT nasal spray Commonly known as:  FLONASE Place 2 sprays into both nostrils daily as needed for allergies or rhinitis.   hydrochlorothiazide 25 MG tablet Commonly known as:  HYDRODIURIL Take 25 mg by mouth. Take one tablet daily as needed for edema   traMADol 50 MG tablet Commonly known as:  ULTRAM Take 50 mg by mouth 2 (two) times daily as needed.   Vitamin  D2 400 units Tabs Take 1 tablet by mouth daily.       Review of Systems:  Review of Systems  Constitutional: Negative for chills and fever.  HENT: Positive for hearing loss.   Eyes: Negative for blurred vision.       Occlusion in eye--following with ophtho  Respiratory: Positive for shortness of breath. Negative for cough, sputum production and wheezing.   Cardiovascular: Negative for chest pain, palpitations and leg swelling.  Gastrointestinal: Negative for abdominal pain, blood in stool, constipation and melena.  Genitourinary: Positive for urgency. Negative for dysuria and frequency.  Musculoskeletal: Positive for joint pain. Negative for falls.  Skin: Negative for itching and rash.  Neurological: Positive for focal weakness and weakness. Negative for dizziness and loss of consciousness.  Endo/Heme/Allergies: Bruises/bleeds easily.  Psychiatric/Behavioral: Negative for depression and memory loss.    Health Maintenance  Topic Date Due  . HEMOGLOBIN A1C  09/23/2015  . INFLUENZA VACCINE  10/19/2015  . OPHTHALMOLOGY EXAM  11/09/2015  . FOOT EXAM  11/16/2015  . URINE MICROALBUMIN  07/04/2016  . TETANUS/TDAP  05/21/2021  . DEXA SCAN  Addressed  . ZOSTAVAX  Completed  . PNA vac Low Risk Adult  Completed    Physical Exam: Vitals:   11/04/15 0957  BP: (!) 176/92  Pulse: 64  Resp: 20  Temp: 97.4 F (36.3 C)  TempSrc: Oral  SpO2: 96%  Weight: 153 lb (69.4 kg)  Height: 5\' 7"  (1.702 m)   Body mass index is 23.96 kg/m. Physical Exam  Constitutional: She is oriented to person, place, and time. She appears well-developed and well-nourished. No distress.  HENT:  Head: Normocephalic and atraumatic.  Eyes:  Glasses, no visible swelling of eye to me  Neck: Neck supple.  Cardiovascular: Normal rate, regular rhythm, normal heart sounds and intact distal pulses.   Pulmonary/Chest: Effort normal and breath sounds normal. No respiratory distress.  Abdominal: Soft. Bowel sounds  are normal.  Musculoskeletal: Normal range of motion.  Slight limp, using cane  Neurological: She is alert and oriented to person, place, and time.  Skin: Skin is warm and dry.    Labs reviewed: Basic Metabolic Panel:  Recent Labs  16/10/96 1055 01/16/15 0435 03/26/15 0804  NA 141 141 141  K 4.5 4.7 4.1  CL 107 110 102  CO2 26 27 25   GLUCOSE 123* 133* 113*  BUN 22* 31* 25  CREATININE 1.00 1.07* 0.92  CALCIUM 9.1 8.1* 8.9   Liver Function Tests:  Recent Labs  03/26/15 0804  AST 9  ALT 8  ALKPHOS 63  BILITOT 0.5  PROT 6.2  ALBUMIN 3.9   No results for input(s): LIPASE, AMYLASE in the last 8760 hours. No results for input(s): AMMONIA in the last 8760 hours. CBC:  Recent Labs  01/16/15 0435 01/17/15 0441 01/18/15 0517 03/26/15 0804  WBC 12.6* 8.9 8.8 5.0  NEUTROABS  --   --   --  3.3  HGB 9.7* 9.0* 8.6*  --   HCT 29.8* 27.7* 25.0* 39.2  MCV 90.6 92.3 90.3 87  PLT 156 136* 144* 221   Lipid Panel:  Recent Labs  03/26/15 0804  CHOL 180  HDL 23*  LDLCALC 112*  TRIG 225*  CHOLHDL 7.8*   Lab Results  Component Value Date   HGBA1C 5.8 (H) 03/26/2015    Assessment/Plan 1. Essential hypertension -see renal htn -bp at goal at home, but has white coat htn here -cont same regimen and monitor--she will let me know if she has readings over 140/90 regularly at home - CBC with Differential/Platelet; Future - Comprehensive metabolic panel; Future  2. Controlled type 2 diabetes mellitus with stage 3 chronic kidney disease, without long-term current use of insulin (HCC) -sugars have been well controlled longstanding -cont diet only and some exercise (but does not follow a regimented program) -got cough with ace inhibitors - Comprehensive metabolic panel; Future - Lipid panel; Future - Hemoglobin A1c; Future  3. Primary osteoarthritis of right knee -does not plan on any more surgeries  -cont tramadol on bad days  4. Shortness of breath - had negative  cardiac workup 2 years ago - admits she is less active than she used to be and agrees with me that she's deconditioned - CBC with Differential/Platelet; Future  5. Hyperlipidemia - did not tolerate statin therapy, tries to eat a low fat diet, does not exercise much, but this was encouraged  - Lipid panel; Future  6. Renal hypertension, stage 1-4 or unspecified chronic kidney disease - cont current bp regimen, cannot take ace, renal function has been stable - carvedilol (COREG) 12.5 MG tablet; Take 1 tablet (12.5 mg total) by mouth 2 (two) times daily with a meal. For blood pressure  Dispense: 180 tablet; Refill: 3 - Comprehensive metabolic panel; Future   Wants to wait on flu shot until October  Labs/tests ordered:   Orders Placed This Encounter  Procedures  . CBC with Differential/Platelet    Standing Status:   Future    Standing Expiration Date:   07/04/2016  . Comprehensive metabolic panel    Standing Status:   Future    Standing Expiration Date:   07/04/2016    Order Specific Question:   Has the patient fasted?    Answer:   Yes  . Lipid panel    Standing Status:   Future    Standing Expiration Date:   07/04/2016    Order Specific Question:   Has the patient fasted?    Answer:   Yes  . Hemoglobin A1c    Standing Status:   Future    Standing Expiration Date:   07/04/2016    Next appt:  04/07/2016  Artice Bergerson L. Karron Goens, D.O. Geriatrics [facility] [address] [place], [place] [address] Cell Phone (Mon-Fri 8am-5pm):  [phone] On Call:  [phone] & follow prompts after 5pm & weekends Office Phone:  [phone] Office Fax:  [phone]

## 2015-11-15 DIAGNOSIS — H34831 Tributary (branch) retinal vein occlusion, right eye, with macular edema: Secondary | ICD-10-CM | POA: Diagnosis not present

## 2015-12-20 DIAGNOSIS — H34831 Tributary (branch) retinal vein occlusion, right eye, with macular edema: Secondary | ICD-10-CM | POA: Diagnosis not present

## 2015-12-27 DIAGNOSIS — Z96652 Presence of left artificial knee joint: Secondary | ICD-10-CM | POA: Diagnosis not present

## 2015-12-27 DIAGNOSIS — Z471 Aftercare following joint replacement surgery: Secondary | ICD-10-CM | POA: Diagnosis not present

## 2016-01-17 DIAGNOSIS — H34831 Tributary (branch) retinal vein occlusion, right eye, with macular edema: Secondary | ICD-10-CM | POA: Diagnosis not present

## 2016-02-28 DIAGNOSIS — H34831 Tributary (branch) retinal vein occlusion, right eye, with macular edema: Secondary | ICD-10-CM | POA: Diagnosis not present

## 2016-03-30 DIAGNOSIS — H34831 Tributary (branch) retinal vein occlusion, right eye, with macular edema: Secondary | ICD-10-CM | POA: Diagnosis not present

## 2016-04-06 ENCOUNTER — Ambulatory Visit: Payer: Medicare Other

## 2016-04-06 ENCOUNTER — Other Ambulatory Visit: Payer: Medicare Other

## 2016-04-07 ENCOUNTER — Encounter: Payer: Medicare Other | Admitting: Internal Medicine

## 2016-04-17 ENCOUNTER — Other Ambulatory Visit: Payer: Self-pay

## 2016-04-17 ENCOUNTER — Other Ambulatory Visit: Payer: Medicare Other

## 2016-04-17 ENCOUNTER — Ambulatory Visit (INDEPENDENT_AMBULATORY_CARE_PROVIDER_SITE_OTHER): Payer: Medicare Other

## 2016-04-17 VITALS — BP 174/86 | HR 71 | Temp 97.5°F | Ht 67.0 in | Wt 160.4 lb

## 2016-04-17 DIAGNOSIS — I129 Hypertensive chronic kidney disease with stage 1 through stage 4 chronic kidney disease, or unspecified chronic kidney disease: Secondary | ICD-10-CM | POA: Diagnosis not present

## 2016-04-17 DIAGNOSIS — R0602 Shortness of breath: Secondary | ICD-10-CM | POA: Diagnosis not present

## 2016-04-17 DIAGNOSIS — E782 Mixed hyperlipidemia: Secondary | ICD-10-CM | POA: Diagnosis not present

## 2016-04-17 DIAGNOSIS — N183 Chronic kidney disease, stage 3 (moderate): Secondary | ICD-10-CM

## 2016-04-17 DIAGNOSIS — E1122 Type 2 diabetes mellitus with diabetic chronic kidney disease: Secondary | ICD-10-CM | POA: Diagnosis not present

## 2016-04-17 DIAGNOSIS — Z0000 Encounter for general adult medical examination without abnormal findings: Secondary | ICD-10-CM | POA: Diagnosis not present

## 2016-04-17 LAB — COMPREHENSIVE METABOLIC PANEL
ALT: 10 U/L (ref 6–29)
AST: 12 U/L (ref 10–35)
Albumin: 4.2 g/dL (ref 3.6–5.1)
Alkaline Phosphatase: 63 U/L (ref 33–130)
BUN: 20 mg/dL (ref 7–25)
CO2: 29 mmol/L (ref 20–31)
Calcium: 9 mg/dL (ref 8.6–10.4)
Chloride: 105 mmol/L (ref 98–110)
Creat: 0.97 mg/dL — ABNORMAL HIGH (ref 0.60–0.88)
Glucose, Bld: 112 mg/dL — ABNORMAL HIGH (ref 65–99)
Potassium: 4 mmol/L (ref 3.5–5.3)
Sodium: 142 mmol/L (ref 135–146)
Total Bilirubin: 0.9 mg/dL (ref 0.2–1.2)
Total Protein: 7 g/dL (ref 6.1–8.1)

## 2016-04-17 LAB — CBC WITH DIFFERENTIAL/PLATELET
Basophils Absolute: 0 cells/uL (ref 0–200)
Basophils Relative: 0 %
Eosinophils Absolute: 168 cells/uL (ref 15–500)
Eosinophils Relative: 3 %
HCT: 43 % (ref 35.0–45.0)
Hemoglobin: 14.3 g/dL (ref 11.7–15.5)
Lymphocytes Relative: 23 %
Lymphs Abs: 1288 cells/uL (ref 850–3900)
MCH: 29.7 pg (ref 27.0–33.0)
MCHC: 33.3 g/dL (ref 32.0–36.0)
MCV: 89.4 fL (ref 80.0–100.0)
MPV: 10.1 fL (ref 7.5–12.5)
Monocytes Absolute: 224 cells/uL (ref 200–950)
Monocytes Relative: 4 %
Neutro Abs: 3920 cells/uL (ref 1500–7800)
Neutrophils Relative %: 70 %
Platelets: 197 10*3/uL (ref 140–400)
RBC: 4.81 MIL/uL (ref 3.80–5.10)
RDW: 14 % (ref 11.0–15.0)
WBC: 5.6 10*3/uL (ref 3.8–10.8)

## 2016-04-17 LAB — HEMOGLOBIN A1C
Hgb A1c MFr Bld: 5.6 % (ref <5.7)
Mean Plasma Glucose: 114 mg/dL

## 2016-04-17 LAB — LIPID PANEL
Cholesterol: 205 mg/dL — ABNORMAL HIGH (ref <200)
HDL: 26 mg/dL — ABNORMAL LOW (ref >50)
LDL Cholesterol: 128 mg/dL — ABNORMAL HIGH (ref <100)
Total CHOL/HDL Ratio: 7.9 Ratio — ABNORMAL HIGH (ref <5.0)
Triglycerides: 254 mg/dL — ABNORMAL HIGH (ref <150)
VLDL: 51 mg/dL — ABNORMAL HIGH (ref <30)

## 2016-04-17 MED ORDER — CARVEDILOL 12.5 MG PO TABS
12.5000 mg | ORAL_TABLET | Freq: Two times a day (BID) | ORAL | 3 refills | Status: DC
Start: 2016-04-17 — End: 2017-05-17

## 2016-04-17 NOTE — Progress Notes (Signed)
   I reviewed health advisor's note, was available for consultation and agree with the assessment and plan as written.  Had labs this am before seeing LPN.  Will do foot exam next week.  Will send in coreg which could be pended for me to sign.  Lisa [name] [name], D.O. Geriatrics [facility] [address] [place], [place] [address] Cell Phone (Mon-Fri 8am-5pm):  [phone] On Call:  [phone] & follow prompts after 5pm & weekends Office Phone:  [phone] Office Fax:  [phone]    Quick Notes   Health Maintenance:  Due for A1C and Foot Exam    Abnormal Screen: None; MMSE-28/30 Passed Clock Test    Patient Concerns:  Pt states she is almost of out her Coreg and will need a refill prior to her appt with you next week (04/27/16).     Nurse Concerns:  None

## 2016-04-17 NOTE — Patient Instructions (Addendum)
Ms. Stegman , Thank you for taking time to come for your Medicare Wellness Visit. I appreciate your ongoing commitment to your health goals. Please review the following plan we discussed and let me know if I can assist you in the future.   These are the goals we discussed: Goals    . <enter goal here>          Starting 04/17/16, I will maintain my current lifestyle.        This is a list of the screening recommended for you and due dates:  Health Maintenance  Topic Date Due  . Hemoglobin A1C  09/23/2015  . Eye exam for diabetics  11/09/2015  . Complete foot exam   11/16/2015  . Urine Protein Check  07/04/2016  . Tetanus Vaccine  05/21/2021  . Flu Shot  Completed  . DEXA scan (bone density measurement)  Addressed  . Shingles Vaccine  Completed  . Pneumonia vaccines  Completed  Preventive Care for Adults  A healthy lifestyle and preventive care can promote health and wellness. Preventive health guidelines for adults include the following key practices.  . A routine yearly physical is a good way to check with your health care provider about your health and preventive screening. It is a chance to share any concerns and updates on your health and to receive a thorough exam.  . Visit your dentist for a routine exam and preventive care every 6 months. Brush your teeth twice a day and floss once a day. Good oral hygiene prevents tooth decay and gum disease.  . The frequency of eye exams is based on your age, health, family medical history, use  of contact lenses, and other factors. Follow your health care provider's ecommendations for frequency of eye exams.  . Eat a healthy diet. Foods like vegetables, fruits, whole grains, low-fat dairy products, and lean protein foods contain the nutrients you need without too many calories. Decrease your intake of foods high in solid fats, added sugars, and salt. Eat the right amount of calories for you. Get information about a proper diet from your  health care provider, if necessary.  . Regular physical exercise is one of the most important things you can do for your health. Most adults should get at least 150 minutes of moderate-intensity exercise (any activity that increases your heart rate and causes you to sweat) each week. In addition, most adults need muscle-strengthening exercises on 2 or more days a week.  Silver Sneakers may be a benefit available to you. To determine eligibility, you may visit the website: www.silversneakers.com or contact program at [phone] Mon-Fri between 8AM-8PM.   . Maintain a healthy weight. The body mass index (BMI) is a screening tool to identify possible weight problems. It provides an estimate of body fat based on height and weight. Your health care provider can find your BMI and can help you achieve or maintain a healthy weight.   For adults 20 years and older: ? A BMI below 18.5 is considered underweight. ? A BMI of 18.5 to 24.9 is normal. ? A BMI of 25 to 29.9 is considered overweight. ? A BMI of 30 and above is considered obese.   . Maintain normal blood lipids and cholesterol levels by exercising and minimizing your intake of saturated fat. Eat a balanced diet with plenty of fruit and vegetables. Blood tests for lipids and cholesterol should begin at age 81 and be repeated every 5 years. If your lipid or cholesterol levels are  high, you are over 50, or you are at high risk for heart disease, you may need your cholesterol levels checked more frequently. Ongoing high lipid and cholesterol levels should be treated with medicines if diet and exercise are not working.  . If you smoke, find out from your health care provider how to quit. If you do not use tobacco, please do not start.  . If you choose to drink alcohol, please do not consume more than 2 drinks per day. One drink is considered to be 12 ounces (355 mL) of beer, 5 ounces (148 mL) of wine, or 1.5 ounces (44 mL) of liquor.  . If you are  70-67 years old, ask your health care provider if you should take aspirin to prevent strokes.  . Use sunscreen. Apply sunscreen liberally and repeatedly throughout the day. You should seek shade when your shadow is shorter than you. Protect yourself by wearing long sleeves, pants, a wide-brimmed hat, and sunglasses year round, whenever you are outdoors.  . Once a month, do a whole body skin exam, using a mirror to look at the skin on your back. Tell your health care provider of new moles, moles that have irregular borders, moles that are larger than a pencil eraser, or moles that have changed in shape or color.

## 2016-04-17 NOTE — Progress Notes (Signed)
Subjective:   Lisa [name] is a 83 y.o. female who presents for an Initial Medicare Annual Wellness Visit.  Review of Systems    Cardiac Risk Factors include: advanced age (>8655men, 70>65 women);hypertension;dyslipidemia;family history of premature cardiovascular disease;sedentary lifestyle     Objective:    Today's Vitals   04/17/16 1514  BP: (!) 174/86  Pulse: 71  Temp: 97.5 F (36.4 C)  TempSrc: Oral  SpO2: 97%  Weight: 160 lb 6.4 oz (72.8 kg)  Height: 5\' 7"  (1.702 m)   Body mass index is 25.12 kg/m.   Current Medications (verified) Outpatient Encounter Prescriptions as of 04/17/2016  Medication Sig  . b complex vitamins tablet Take 1 tablet by mouth daily.  . carvedilol (COREG) 12.5 MG tablet Take 1 tablet (12.5 mg total) by mouth 2 (two) times daily with a meal. For blood pressure  . clopidogrel (PLAVIX) 75 MG tablet Take 1 tablet (75 mg total) by mouth daily with breakfast.  . Ergocalciferol (VITAMIN D2) 400 UNITS TABS Take 1 tablet by mouth daily.   . fluticasone (FLONASE) 50 MCG/ACT nasal spray Place 2 sprays into both nostrils daily as needed for allergies or rhinitis.   . hydrochlorothiazide (HYDRODIURIL) 25 MG tablet Take 25 mg by mouth. Take one tablet daily as needed for edema  . Multiple Vitamins-Minerals (PRESERVISION AREDS 2+MULTI VIT PO) Take 2 capsules by mouth daily.  . traMADol (ULTRAM) 50 MG tablet Take 1 tablet (50 mg total) by mouth 2 (two) times daily as needed.   No facility-administered encounter medications on file as of 04/17/2016.     Allergies (verified) Ace inhibitors and Simvastatin   History: Past Medical History:  Diagnosis Date  . Cervicalgia   . Chronic kidney disease, stage III (moderate)   . Dermatophytosis of groin and perianal area   . Hyperlipidemia LDL goal < 100   . Insomnia, unspecified   . Muscle weakness (generalized)   . Osteoarthrosis, unspecified whether generalized or localized, lower leg    osteoarthritis -  upper thoracic area, left knee.  . Other abnormal glucose   . Other B-complex deficiencies   . Other malaise and fatigue   . Palpitations   . Senile cataract, unspecified   . Shortness of breath   . Stroke [facility](HCC)    small "stroke" - right sided weakness for a day.  . Tachycardia, unspecified   . Type II or unspecified type diabetes mellitus with renal manifestations, not stated as uncontrolled(250.40)    "prediabetes"-no meds.  . Undiagnosed cardiac murmurs   . Unspecified essential hypertension   . Unspecified vitamin D deficiency    Past Surgical History:  Procedure Laterality Date  . CATARACT EXTRACTION, BILATERAL Bilateral   . EYE SURGERY     cataract  . TOE AMPUTATION  2002  . TONSILLECTOMY  1944  . TONSILLECTOMY     younger yrs  . TOTAL KNEE ARTHROPLASTY Left 01/15/2015   Procedure: LEFT TOTAL KNEE ARTHROPLASTY;  Surgeon: Eugenia Mcalpineobert Collins, MD;  Location: [facility];  Service: Orthopedics;  Laterality: Left;  . TUBAL LIGATION  1951   Family History  Problem Relation Age of Onset  . Cerebral aneurysm Mother   . Heart attack Father   . Alzheimer's disease Sister   . Emphysema Sister   . Stroke Sister   . Alzheimer's disease Sister   . Arthritis Sister    Social History   Occupational History  . retired    Social History Main Topics  . Smoking status: Never  Smoker  . Smokeless tobacco: Never Used  . Alcohol use Yes     Comment: rare social every 3 or 4 months wine  . Drug use: No  . Sexual activity: No    Tobacco Counseling Counseling given: No   Activities of Daily Living In your present state of health, do you have any difficulty performing the following activities: 04/17/2016  Hearing? N  Vision? N  Difficulty concentrating or making decisions? N  Walking or climbing stairs? Y  Dressing or bathing? N  Doing errands, shopping? N  Preparing Food and eating ? N  Using the Toilet? N  In the past six months, have you accidently leaked urine? Y  Do you have  problems with loss of bowel control? N  Managing your Medications? N  Managing your Finances? N  Housekeeping or managing your Housekeeping? N  Some recent data might be hidden    Immunizations and Health Maintenance Immunization History  Administered Date(s) Administered  . Influenza Split 11/18/2008, 12/28/2009  . Influenza Whole 12/24/2012  . Influenza-Unspecified 01/13/2014, 12/19/2014, 01/15/2016  . PPD Test 01/18/2015  . Pneumococcal Conjugate-13 01/12/2014  . Pneumococcal Polysaccharide-23 05/22/2011  . Tdap 05/22/2011  . Zoster 12/20/2010   Health Maintenance Due  Topic Date Due  . HEMOGLOBIN A1C  09/23/2015  . OPHTHALMOLOGY EXAM  11/09/2015  . FOOT EXAM  11/16/2015    Patient Care Team: Kermit Balo, DO as PCP - General (Geriatric Medicine) Eugenia Mcalpine, MD as Consulting Physician (Orthopedic Surgery) Marvel Plan, MD as Consulting Physician (Neurology)  Indicate any recent Medical Services you may have received from other than [facility] providers in the past year (date may be approximate).     Assessment:   This is a routine wellness examination for Lisa [name].  Hearing/Vision screen Hearing Screening Comments: Pt has never had a hearing screen; denies any problems with hearing.  Vision Screening Comments: Pt's last eye exam was 1//18 with Dr. Luciana [name].   Dietary issues and exercise activities discussed: Current Exercise Habits: The patient does not participate in regular exercise at present, Exercise limited by: None identified  Goals    . <enter goal here>          Starting 04/17/16, I will maintain my current lifestyle.       Depression Screen PHQ 2/9 Scores 04/17/2016 07/05/2015 04/12/2015 01/12/2014 09/29/2013 09/12/2012  PHQ - 2 Score 0 0 0 0 0 0    Fall Risk Fall Risk  04/17/2016 07/05/2015 04/12/2015 04/16/2014 01/28/2014  Falls in the past year? No No No No Yes  Number falls in past yr: - - - - 1  Risk for fall due to : Impaired balance/gait - - - -     Cognitive Function: MMSE - Mini Mental State Exam 04/17/2016 04/12/2015  Not completed: - (No Data)  Orientation to time 5 5  Orientation to Place 5 5  Registration 3 3  Attention/ Calculation 4 5  Recall 3 2  Language- name 2 objects 2 2  Language- repeat 1 1  Language- follow 3 step command 3 3  Language- read & follow direction 1 1  Write a sentence 1 1  Copy design 0 0  Total score 28 28        Screening Tests Health Maintenance  Topic Date Due  . HEMOGLOBIN A1C  09/23/2015  . OPHTHALMOLOGY EXAM  11/09/2015  . FOOT EXAM  11/16/2015  . URINE MICROALBUMIN  07/04/2016  . TETANUS/TDAP  05/21/2021  . INFLUENZA VACCINE  Completed  . DEXA SCAN  Addressed  . ZOSTAVAX  Completed  . PNA vac Low Risk Adult  Completed      Plan:    I have personally reviewed and addressed the Medicare Annual Wellness questionnaire and have noted the following in the patient's chart:  A. Medical and social history B. Use of alcohol, tobacco or illicit drugs  C. Current medications and supplements D. Functional ability and status E.  Nutritional status F.  Physical activity G. Advance directives H. List of other physicians I.  Hospitalizations, surgeries, and ER visits in previous 12 months J.  Vitals K. Screenings to include hearing, vision, cognitive, depression L. Referrals and appointments - none  In addition, I have reviewed and discussed with patient certain preventive protocols, quality metrics, and best practice recommendations. A written personalized care plan for preventive services as well as general preventive health recommendations were provided to patient.  See attached scanned questionnaire for additional information.   Signed,   Nilda Calamity, LPN Health Advisor

## 2016-04-20 ENCOUNTER — Encounter: Payer: Self-pay | Admitting: *Deleted

## 2016-04-27 ENCOUNTER — Ambulatory Visit (INDEPENDENT_AMBULATORY_CARE_PROVIDER_SITE_OTHER): Payer: Medicare Other | Admitting: Internal Medicine

## 2016-04-27 ENCOUNTER — Encounter: Payer: Self-pay | Admitting: Internal Medicine

## 2016-04-27 VITALS — BP 140/88 | HR 62 | Temp 98.0°F | Ht 67.0 in | Wt 159.0 lb

## 2016-04-27 DIAGNOSIS — I10 Essential (primary) hypertension: Secondary | ICD-10-CM

## 2016-04-27 DIAGNOSIS — E1122 Type 2 diabetes mellitus with diabetic chronic kidney disease: Secondary | ICD-10-CM | POA: Diagnosis not present

## 2016-04-27 DIAGNOSIS — N183 Chronic kidney disease, stage 3 unspecified: Secondary | ICD-10-CM

## 2016-04-27 DIAGNOSIS — Z0000 Encounter for general adult medical examination without abnormal findings: Secondary | ICD-10-CM

## 2016-04-27 DIAGNOSIS — M1711 Unilateral primary osteoarthritis, right knee: Secondary | ICD-10-CM

## 2016-04-27 MED ORDER — TRAMADOL HCL 50 MG PO TABS: 50.0000 mg | ORAL_TABLET | Freq: Two times a day (BID) | ORAL | 3 refills | Status: DC | PRN

## 2016-04-27 NOTE — Progress Notes (Signed)
Provider:  Gwenith Spitziffany L. Renato Gailseed, D.O., C.M.D. Location:   [facility]   Place of Service:   clinic  Previous PCP: Bufford SpikesEED, Cullen Lahaie, DO Patient Care Team: Kermit Baloiffany L Iretha Kirley, DO as PCP - General (Geriatric Medicine) Eugenia Mcalpineobert Collins, MD as Consulting Physician (Orthopedic Surgery) Marvel PlanJindong Xu, MD as Consulting Physician (Neurology)  Extended Emergency Contact Information Primary Emergency Contact: Smith,Kathy Address: [address]          [place], [place] [address] [place] Home Phone: [phone] Relation: Daughter Secondary Emergency Contact: Tawni Carnesash,June          [place], [place] Mobile Phone: [phone] Relation: Friend  Code Status: DNR--reviewed with her again today and wishes unchanged.  Goals of Care: Advanced Directive information Advanced Directives 04/27/2016  Does Patient Have a Medical Advance Directive? Yes  Type of Estate agentAdvance Directive Healthcare Power of NickersonAttorney;Living will;Out of facility DNR (pink MOST or yellow form)  Does patient want to make changes to medical advance directive? No - Patient declined  Copy of Healthcare Power of Attorney in Chart? Yes  Would patient like information on creating a medical advance directive? -  Pre-existing out of facility DNR order (yellow form or pink MOST form) Yellow form placed in chart (order not valid for inpatient use)   Chief Complaint  Patient presents with  . Annual Exam    CPE    HPI: Patient is a 83 y.o. female seen today for an annual physical exam.  Walks with cane for security.  Carries it some.  Right knee (the nonsurgical one) is not dependable.  Furniture surfs at home.  No falls.  [place] where she tried to go down a step unassisted, but her son caught her.   Seeing as well as expected.  Still doing shots for occlusion (every 5 wks).  It has improved significantly.  Vision was 20/80 initially and now 20/25.    Is on plavix instead of aspirin.    Does not want  bone density b/c won't do anything about it anyway  Refuses cholesterol pills at 89 due to side effects and age.  Past Medical History:  Diagnosis Date  . Cervicalgia   . Chronic kidney disease, stage III (moderate)   . Dermatophytosis of groin and perianal area   . Hyperlipidemia LDL goal < 100   . Insomnia, unspecified   . Muscle weakness (generalized)   . Osteoarthrosis, unspecified whether generalized or localized, lower leg    osteoarthritis - upper thoracic area, left knee.  . Other abnormal glucose   . Other B-complex deficiencies   . Other malaise and fatigue   . Palpitations   . Senile cataract, unspecified   . Shortness of breath   . Stroke [facility](HCC)    small "stroke" - right sided weakness for a day.  . Tachycardia, unspecified   . Type II or unspecified type diabetes mellitus with renal manifestations, not stated as uncontrolled(250.40)    "prediabetes"-no meds.  . Undiagnosed cardiac murmurs   . Unspecified essential hypertension   . Unspecified vitamin D deficiency    Past Surgical History:  Procedure Laterality Date  . CATARACT EXTRACTION, BILATERAL Bilateral   . EYE SURGERY     cataract  . TOE AMPUTATION  2002  . TONSILLECTOMY  1944  . TONSILLECTOMY     younger yrs  . TOTAL KNEE ARTHROPLASTY Left 01/15/2015   Procedure: LEFT TOTAL KNEE ARTHROPLASTY;  Surgeon: Eugenia Mcalpineobert Collins, MD;  Location: [facility];  Service: Orthopedics;  Laterality: Left;  . TUBAL LIGATION  1951    reports that she has never smoked. She has never used smokeless tobacco. She reports that she drinks alcohol. She reports that she does not use drugs.  Functional Status Survey:    Family History  Problem Relation Age of Onset  . Cerebral aneurysm Mother   . Heart attack Father   . Alzheimer's disease Sister   . Emphysema Sister   . Stroke Sister   . Alzheimer's disease Sister   . Arthritis Sister     Health Maintenance  Topic Date Due  . OPHTHALMOLOGY EXAM  11/09/2015  . FOOT EXAM   11/16/2015  . URINE MICROALBUMIN  07/04/2016  . HEMOGLOBIN A1C  10/15/2016  . TETANUS/TDAP  05/21/2021  . INFLUENZA VACCINE  Completed  . DEXA SCAN  Addressed  . ZOSTAVAX  Completed  . PNA vac Low Risk Adult  Completed    Allergies  Allergen Reactions  . Ace Inhibitors Cough  . Simvastatin Other (See Comments)    Body aches and pains    Allergies as of 04/27/2016      Reactions   Ace Inhibitors Cough   Simvastatin Other (See Comments)   Body aches and pains      Medication List       Accurate as of 04/27/16  3:05 PM. Always use your most recent med list.          b complex vitamins tablet Take 1 tablet by mouth daily.   carvedilol 12.5 MG tablet Commonly known as:  COREG Take 1 tablet (12.5 mg total) by mouth 2 (two) times daily with a meal. For blood pressure   clopidogrel 75 MG tablet Commonly known as:  PLAVIX Take 1 tablet (75 mg total) by mouth daily with breakfast.   fluticasone 50 MCG/ACT nasal spray Commonly known as:  FLONASE Place 2 sprays into both nostrils daily as needed for allergies or rhinitis.   hydrochlorothiazide 25 MG tablet Commonly known as:  HYDRODIURIL Take 25 mg by mouth. Take one tablet daily as needed for edema   PRESERVISION AREDS 2+MULTI VIT PO Take 2 capsules by mouth daily.   traMADol 50 MG tablet Commonly known as:  ULTRAM Take 1 tablet (50 mg total) by mouth 2 (two) times daily as needed.   Vitamin D2 400 units Tabs Take 1 tablet by mouth daily.       Review of Systems  Constitutional: Negative for chills, fever and malaise/fatigue.  HENT: Positive for hearing loss. Negative for congestion.   Eyes: Negative for blurred vision.       Glasses  Respiratory: Negative for cough and shortness of breath.   Cardiovascular: Positive for leg swelling. Negative for chest pain and palpitations.       Mild in feet  Gastrointestinal: Negative for abdominal pain, blood in stool, constipation and melena.  Genitourinary: Negative  for dysuria.  Musculoskeletal: Positive for joint pain. Negative for falls and myalgias.  Skin: Negative for itching and rash.  Neurological: Positive for focal weakness. Negative for dizziness, loss of consciousness and weakness.       Mild residual weakness from prior stroke  Endo/Heme/Allergies: Bruises/bleeds easily.  Psychiatric/Behavioral: Negative for depression and memory loss. The patient is not nervous/anxious and does not have insomnia.     Vitals:   04/27/16 1420  BP: 140/88  Pulse: 62  Temp: 98 F (36.7 C)  TempSrc: Oral  SpO2: 97%  Weight: 159 lb (72.1 kg)  Height: 5\' 7"  (1.702 m)   Body mass index is 24.9 kg/m. Physical Exam  Constitutional: She is oriented to person, place, and time. She appears well-developed and well-nourished. No distress.  HENT:  Head: Normocephalic and atraumatic.  Right Ear: External ear normal.  Left Ear: External ear normal.  Nose: Nose normal.  Mouth/Throat: Oropharynx is clear and moist. No oropharyngeal exudate.  Eyes: Conjunctivae and EOM are normal. Pupils are equal, round, and reactive to light.  Neck: Normal range of motion. Neck supple. No JVD present. No tracheal deviation present. No thyromegaly present.  Cardiovascular: Normal rate, regular rhythm and normal heart sounds.   Pulmonary/Chest: Effort normal and breath sounds normal. No respiratory distress. She has no rales.  Abdominal: Soft. Bowel sounds are normal.  Musculoskeletal: Normal range of motion.  Slight leg weakness s/p cva plus knee tenderness; ambulates with cane  Neurological: She is alert and oriented to person, place, and time. No cranial nerve deficit.  Skin: Skin is warm and dry.  Psychiatric: She has a normal mood and affect. Her behavior is normal. Judgment and thought content normal.    Labs reviewed: Basic Metabolic Panel:  Recent Labs  40/98/11 0907  NA 142  K 4.0  CL 105  CO2 29  GLUCOSE 112*  BUN 20  CREATININE 0.97*  CALCIUM 9.0    Liver Function Tests:  Recent Labs  04/17/16 0907  AST 12  ALT 10  ALKPHOS 63  BILITOT 0.9  PROT 7.0  ALBUMIN 4.2   No results for input(s): LIPASE, AMYLASE in the last 8760 hours. No results for input(s): AMMONIA in the last 8760 hours. CBC:  Recent Labs  04/17/16 0907  WBC 5.6  NEUTROABS 3,920  HGB 14.3  HCT 43.0  MCV 89.4  PLT 197   Cardiac Enzymes: No results for input(s): CKTOTAL, CKMB, CKMBINDEX, TROPONINI in the last 8760 hours. BNP: Invalid input(s): POCBNP Lab Results  Component Value Date   HGBA1C 5.6 04/17/2016     Assessment/Plan 1. Annual physical exam -performed today, refused breast exam, refused bone density, foot exam was done  2. Essential hypertension -bp at goal with current therapy, no changes needed, no dizziness  3. Controlled type 2 diabetes mellitus with stage 3 chronic kidney disease, without long-term current use of insulin (HCC) -has been controlled for several years now despite her love of sweets -cont same regimen  4. Primary osteoarthritis of right knee -ambulates with cane, does not plan on any more surgeries  Labs/tests ordered:  No orders of the defined types were placed in this encounter.  Hermela Hardt L. Stephanne Greeley, D.O. Geriatrics [facility] [address], [place] [address] Cell Phone (Mon-Fri 8am-5pm):  [phone] On Call:  [phone] & follow prompts after 5pm & weekends Office Phone:  [phone] Office Fax:  [phone]

## 2016-05-08 DIAGNOSIS — H34831 Tributary (branch) retinal vein occlusion, right eye, with macular edema: Secondary | ICD-10-CM | POA: Diagnosis not present

## 2016-05-18 DIAGNOSIS — H353131 Nonexudative age-related macular degeneration, bilateral, early dry stage: Secondary | ICD-10-CM | POA: Diagnosis not present

## 2016-05-18 DIAGNOSIS — H04123 Dry eye syndrome of bilateral lacrimal glands: Secondary | ICD-10-CM | POA: Diagnosis not present

## 2016-06-12 DIAGNOSIS — H353111 Nonexudative age-related macular degeneration, right eye, early dry stage: Secondary | ICD-10-CM | POA: Diagnosis not present

## 2016-06-12 DIAGNOSIS — H34831 Tributary (branch) retinal vein occlusion, right eye, with macular edema: Secondary | ICD-10-CM | POA: Diagnosis not present

## 2016-07-17 DIAGNOSIS — H34831 Tributary (branch) retinal vein occlusion, right eye, with macular edema: Secondary | ICD-10-CM | POA: Diagnosis not present

## 2016-07-24 ENCOUNTER — Encounter: Payer: Self-pay | Admitting: Internal Medicine

## 2016-07-24 ENCOUNTER — Ambulatory Visit (INDEPENDENT_AMBULATORY_CARE_PROVIDER_SITE_OTHER): Payer: Medicare Other | Admitting: Internal Medicine

## 2016-07-24 VITALS — BP 140/80 | HR 64 | Temp 97.3°F | Wt 157.0 lb

## 2016-07-24 DIAGNOSIS — I10 Essential (primary) hypertension: Secondary | ICD-10-CM | POA: Diagnosis not present

## 2016-07-24 DIAGNOSIS — E1122 Type 2 diabetes mellitus with diabetic chronic kidney disease: Secondary | ICD-10-CM | POA: Diagnosis not present

## 2016-07-24 DIAGNOSIS — E538 Deficiency of other specified B group vitamins: Secondary | ICD-10-CM

## 2016-07-24 DIAGNOSIS — N183 Chronic kidney disease, stage 3 unspecified: Secondary | ICD-10-CM

## 2016-07-24 DIAGNOSIS — G8929 Other chronic pain: Secondary | ICD-10-CM

## 2016-07-24 DIAGNOSIS — M542 Cervicalgia: Secondary | ICD-10-CM | POA: Diagnosis not present

## 2016-07-24 DIAGNOSIS — R5383 Other fatigue: Secondary | ICD-10-CM | POA: Diagnosis not present

## 2016-07-24 DIAGNOSIS — R0602 Shortness of breath: Secondary | ICD-10-CM

## 2016-07-24 DIAGNOSIS — E782 Mixed hyperlipidemia: Secondary | ICD-10-CM

## 2016-07-24 MED ORDER — CLOPIDOGREL BISULFATE 75 MG PO TABS: 75.0000 mg | ORAL_TABLET | Freq: Every day | ORAL | 3 refills | Status: DC

## 2016-07-24 NOTE — Progress Notes (Signed)
Location:  [facility] clinic Provider:  Torrez Renfroe L. Renato Gails, D.O., C.M.D.  Code Status: DNR Goals of Care:  Advanced Directives 07/24/2016  Does Patient Have a Medical Advance Directive? Yes  Type of Advance Directive Out of facility DNR (pink MOST or yellow form);Living will  Does patient want to make changes to medical advance directive? -  Copy of Healthcare Power of Attorney in Chart? -  Would patient like information on creating a medical advance directive? -  Pre-existing out of facility DNR order (yellow form or pink MOST form) Yellow form placed in chart (order not valid for inpatient use)   Chief Complaint  Patient presents with  . Medical Management of Chronic Issues    follow-up    HPI: Patient is a 83 y.o. female seen today for medical management of chronic diseases.    Says she doesn't have strength left.  She is shorter and shorter of breath all along.  Echo in 2015 with normal EF and only chronic diastolic chf grade 1. No coughing.  When she starts getting tired.    She gets a pain in her midthoracic pain if she keeps on walking.  She had a fracture there before and bone spurs.   Watched her walk and tested her oxygen and it remained 97%.  She reports that this has been evaluated 3 different times from the cardiac and pulmonary perspectives and nothing found.  Labs have been normal. Reports that at one time when she saw Dr. Allena Katz before me, her b12 was quite low when she was sob, but now she is not even anemic.  No falls.    BP controlled with current regimen of coreg 12.5mg  po bid.  Uses hctz prn only for swelling of ankles which she has not needed with low sodium diet.  Hyperlipidemia:  At 79, we have agreed that she does not want this tested ongoing.    Prior CVA:  bp controlled, does not tolerate lipid meds and refuses further intervention with them. Some residual hemiparesis requiring use of cane for balance.  Past Medical History:  Diagnosis Date  . Cervicalgia     . Chronic kidney disease, stage III (moderate)   . Dermatophytosis of groin and perianal area   . Hyperlipidemia LDL goal < 100   . Insomnia, unspecified   . Muscle weakness (generalized)   . Osteoarthrosis, unspecified whether generalized or localized, lower leg    osteoarthritis - upper thoracic area, left knee.  . Other abnormal glucose   . Other B-complex deficiencies   . Other malaise and fatigue   . Palpitations   . Senile cataract, unspecified   . Shortness of breath   . Stroke [facility])    small "stroke" - right sided weakness for a day.  . Tachycardia, unspecified   . Type II or unspecified type diabetes mellitus with renal manifestations, not stated as uncontrolled(250.40)    "prediabetes"-no meds.  . Undiagnosed cardiac murmurs   . Unspecified essential hypertension   . Unspecified vitamin D deficiency     Past Surgical History:  Procedure Laterality Date  . CATARACT EXTRACTION, BILATERAL Bilateral   . EYE SURGERY     cataract  . TOE AMPUTATION  2002  . TONSILLECTOMY  1944  . TONSILLECTOMY     younger yrs  . TOTAL KNEE ARTHROPLASTY Left 01/15/2015   Procedure: LEFT TOTAL KNEE ARTHROPLASTY;  Surgeon: Eugenia Mcalpine, MD;  Location: [facility];  Service: Orthopedics;  Laterality: Left;  . TUBAL LIGATION  1951    Allergies  Allergen Reactions  . Ace Inhibitors Cough  . Simvastatin Other (See Comments)    Body aches and pains    Allergies as of 07/24/2016      Reactions   Ace Inhibitors Cough   Simvastatin Other (See Comments)   Body aches and pains      Medication List       Accurate as of 07/24/16  1:20 PM. Always use your most recent med list.          b complex vitamins tablet Take 1 tablet by mouth daily.   carvedilol 12.5 MG tablet Commonly known as:  COREG Take 1 tablet (12.5 mg total) by mouth 2 (two) times daily with a meal. For blood pressure   clopidogrel 75 MG tablet Commonly known as:  PLAVIX Take 1 tablet (75 mg total) by mouth daily with  breakfast.   fluticasone 50 MCG/ACT nasal spray Commonly known as:  FLONASE Place 2 sprays into both nostrils daily as needed for allergies or rhinitis.   hydrochlorothiazide 25 MG tablet Commonly known as:  HYDRODIURIL Take 25 mg by mouth. Take one tablet daily as needed for edema   PRESERVISION AREDS 2+MULTI VIT PO Take 2 capsules by mouth daily.   traMADol 50 MG tablet Commonly known as:  ULTRAM Take 1 tablet (50 mg total) by mouth 2 (two) times daily as needed.   Vitamin D2 400 units Tabs Take 1 tablet by mouth daily.       Review of Systems:  Review of Systems  Constitutional: Negative for chills and fever.  HENT: Negative for hearing loss.   Eyes: Negative for blurred vision.       Macular degeneration and glaucoma, gets eye injections  Respiratory: Positive for shortness of breath. Negative for cough, hemoptysis, sputum production and wheezing.   Cardiovascular: Negative for chest pain, palpitations, orthopnea, claudication, leg swelling and PND.  Gastrointestinal: Negative for abdominal pain, blood in stool, constipation, diarrhea and melena.  Genitourinary: Negative for dysuria.  Musculoskeletal: Positive for joint pain. Negative for falls.  Skin: Negative for itching and rash.  Neurological: Negative for dizziness and loss of consciousness.  Endo/Heme/Allergies: Bruises/bleeds easily.  Psychiatric/Behavioral: Negative for depression and memory loss.    Health Maintenance  Topic Date Due  . OPHTHALMOLOGY EXAM  11/09/2015  . URINE MICROALBUMIN  07/04/2016  . HEMOGLOBIN A1C  10/15/2016  . INFLUENZA VACCINE  10/18/2016  . FOOT EXAM  04/27/2017  . TETANUS/TDAP  05/21/2021  . DEXA SCAN  Addressed  . PNA vac Low Risk Adult  Completed    Physical Exam: Vitals:   07/24/16 1302  BP: 140/80  Pulse: 64  Temp: 97.3 F (36.3 C)  TempSrc: Oral  SpO2: 97%  Weight: 157 lb (71.2 kg)   Body mass index is 24.59 kg/m. Physical Exam  Constitutional: She is  oriented to person, place, and time. She appears well-developed and well-nourished. No distress.  Eyes:  glasses  Cardiovascular: Normal rate, regular rhythm, normal heart sounds and intact distal pulses.   Pulmonary/Chest: Effort normal and breath sounds normal. No respiratory distress. She has no wheezes.  Abdominal: Bowel sounds are normal.  Musculoskeletal: Normal range of motion.  Uses cane for balance  Neurological: She is alert and oriented to person, place, and time.  Skin: Skin is warm and dry.  Psychiatric: She has a normal mood and affect.    Labs reviewed: Basic Metabolic Panel:  Recent Labs  16/10/96 0907  NA 142  K 4.0  CL 105  CO2 29  GLUCOSE 112*  BUN 20  CREATININE 0.97*  CALCIUM 9.0   Liver Function Tests:  Recent Labs  04/17/16 0907  AST 12  ALT 10  ALKPHOS 63  BILITOT 0.9  PROT 7.0  ALBUMIN 4.2   No results for input(s): LIPASE, AMYLASE in the last 8760 hours. No results for input(s): AMMONIA in the last 8760 hours. CBC:  Recent Labs  04/17/16 0907  WBC 5.6  NEUTROABS 3,920  HGB 14.3  HCT 43.0  MCV 89.4  PLT 197   Lipid Panel:  Recent Labs  04/17/16 0907  CHOL 205*  HDL 26*  LDLCALC 128*  TRIG 254*  CHOLHDL 7.9*   Lab Results  Component Value Date   HGBA1C 5.6 04/17/2016   Assessment/Plan 1. Mixed hyperlipidemia -does not tolerate statin therapy -tries to watch her diet some due to high TG and slightly elevated LDL in the past--since she won't take any meds, we had agreed there is no sense checking serially  - clopidogrel (PLAVIX) 75 MG tablet; Take 1 tablet (75 mg total) by mouth daily with breakfast.  Dispense: 90 tablet; Refill: 3  2. Essential hypertension - bp well controlled on coreg and hctz prn edema - CBC with Differential/Platelet; Future - COMPLETE METABOLIC PANEL WITH GFR; Future  3. Controlled type 2 diabetes mellitus with stage 3 chronic kidney disease, without long-term current use of insulin  (HCC) -controlled to prediabetic range, not on meds, diet only  4. Fatigue, unspecified type - has had this a bit more lately, but prior workups have been unrevealing from cardiology and pulmonary including echo except grd 1 diastolic dysfunction in 2015 so she refuses further testing now outside of repeat labs before next appt - Vitamin B12; Future  5. Shortness of breath -associated with #4 - Vitamin B12; Future  6. Neck pain, chronic -cont tylenol and tramadol for this--not abusing  7. B12 deficiency - historical, no anemia at present, but due to fatigue and dyspnea, will recheck b12 - Vitamin B12; Future  Labs/tests ordered:   Orders Placed This Encounter  Procedures  . Vitamin B12    Standing Status:   Future    Standing Expiration Date:   04/26/2017  . CBC with Differential/Platelet    Standing Status:   Future    Standing Expiration Date:   04/26/2017  . COMPLETE METABOLIC PANEL WITH GFR    Standing Status:   Future    Standing Expiration Date:   04/26/2017   Next appt:  10/27/2016 med mgt   Myalynn Lingle L. Kela Baccari, D.O. Geriatrics [facility] [address] [place], [place] [address] Cell Phone (Mon-Fri 8am-5pm):  [phone] On Call:  [phone] & follow prompts after 5pm & weekends Office Phone:  [phone] Office Fax:  [phone]

## 2016-08-16 ENCOUNTER — Encounter: Payer: Self-pay | Admitting: Internal Medicine

## 2016-08-17 DIAGNOSIS — H35033 Hypertensive retinopathy, bilateral: Secondary | ICD-10-CM | POA: Diagnosis not present

## 2016-08-17 DIAGNOSIS — H34831 Tributary (branch) retinal vein occlusion, right eye, with macular edema: Secondary | ICD-10-CM | POA: Diagnosis not present

## 2016-08-17 DIAGNOSIS — H353111 Nonexudative age-related macular degeneration, right eye, early dry stage: Secondary | ICD-10-CM | POA: Diagnosis not present

## 2016-08-17 DIAGNOSIS — H35351 Cystoid macular degeneration, right eye: Secondary | ICD-10-CM | POA: Diagnosis not present

## 2016-09-18 DIAGNOSIS — H3561 Retinal hemorrhage, right eye: Secondary | ICD-10-CM | POA: Diagnosis not present

## 2016-09-18 DIAGNOSIS — H35011 Changes in retinal vascular appearance, right eye: Secondary | ICD-10-CM | POA: Diagnosis not present

## 2016-09-18 DIAGNOSIS — H35033 Hypertensive retinopathy, bilateral: Secondary | ICD-10-CM | POA: Diagnosis not present

## 2016-09-18 DIAGNOSIS — H34831 Tributary (branch) retinal vein occlusion, right eye, with macular edema: Secondary | ICD-10-CM | POA: Diagnosis not present

## 2016-10-10 DIAGNOSIS — H35033 Hypertensive retinopathy, bilateral: Secondary | ICD-10-CM | POA: Diagnosis not present

## 2016-10-10 DIAGNOSIS — H3561 Retinal hemorrhage, right eye: Secondary | ICD-10-CM | POA: Diagnosis not present

## 2016-10-10 DIAGNOSIS — H348311 Tributary (branch) retinal vein occlusion, right eye, with retinal neovascularization: Secondary | ICD-10-CM | POA: Diagnosis not present

## 2016-10-24 ENCOUNTER — Other Ambulatory Visit: Payer: Medicare Other

## 2016-10-24 DIAGNOSIS — E538 Deficiency of other specified B group vitamins: Secondary | ICD-10-CM

## 2016-10-24 DIAGNOSIS — R5383 Other fatigue: Secondary | ICD-10-CM | POA: Diagnosis not present

## 2016-10-24 DIAGNOSIS — I10 Essential (primary) hypertension: Secondary | ICD-10-CM

## 2016-10-24 DIAGNOSIS — R0602 Shortness of breath: Secondary | ICD-10-CM

## 2016-10-24 LAB — CBC WITH DIFFERENTIAL/PLATELET
Basophils Absolute: 65 cells/uL (ref 0–200)
Basophils Relative: 1 %
Eosinophils Absolute: 195 cells/uL (ref 15–500)
Eosinophils Relative: 3 %
HCT: 39.7 % (ref 35.0–45.0)
Hemoglobin: 12.8 g/dL (ref 11.7–15.5)
Lymphocytes Relative: 19 %
Lymphs Abs: 1235 cells/uL (ref 850–3900)
MCH: 29.6 pg (ref 27.0–33.0)
MCHC: 32.2 g/dL (ref 32.0–36.0)
MCV: 91.9 fL (ref 80.0–100.0)
MPV: 9.8 fL (ref 7.5–12.5)
Monocytes Absolute: 325 cells/uL (ref 200–950)
Monocytes Relative: 5 %
Neutro Abs: 4680 cells/uL (ref 1500–7800)
Neutrophils Relative %: 72 %
Platelets: 181 10*3/uL (ref 140–400)
RBC: 4.32 MIL/uL (ref 3.80–5.10)
RDW: 14 % (ref 11.0–15.0)
WBC: 6.5 10*3/uL (ref 3.8–10.8)

## 2016-10-24 LAB — COMPLETE METABOLIC PANEL WITH GFR
ALT: 8 U/L (ref 6–29)
AST: 11 U/L (ref 10–35)
Albumin: 4 g/dL (ref 3.6–5.1)
Alkaline Phosphatase: 63 U/L (ref 33–130)
BUN: 26 mg/dL — ABNORMAL HIGH (ref 7–25)
CO2: 23 mmol/L (ref 20–32)
Calcium: 9 mg/dL (ref 8.6–10.4)
Chloride: 107 mmol/L (ref 98–110)
Creat: 1.03 mg/dL — ABNORMAL HIGH (ref 0.60–0.88)
GFR, Est African American: 54 mL/min — ABNORMAL LOW (ref >=60)
GFR, Est Non African American: 47 mL/min — ABNORMAL LOW (ref >=60)
Glucose, Bld: 111 mg/dL — ABNORMAL HIGH (ref 65–99)
Potassium: 4.5 mmol/L (ref 3.5–5.3)
Sodium: 142 mmol/L (ref 135–146)
Total Bilirubin: 0.8 mg/dL (ref 0.2–1.2)
Total Protein: 6.3 g/dL (ref 6.1–8.1)

## 2016-10-25 LAB — VITAMIN B12: Vitamin B-12: 644 pg/mL (ref 200–1100)

## 2016-10-26 ENCOUNTER — Encounter: Payer: Self-pay | Admitting: *Deleted

## 2016-10-27 ENCOUNTER — Ambulatory Visit: Payer: Medicare Other | Admitting: Internal Medicine

## 2016-10-30 ENCOUNTER — Encounter: Payer: Self-pay | Admitting: Internal Medicine

## 2016-10-30 ENCOUNTER — Ambulatory Visit (INDEPENDENT_AMBULATORY_CARE_PROVIDER_SITE_OTHER): Payer: Medicare Other | Admitting: Internal Medicine

## 2016-10-30 VITALS — BP 150/80 | HR 69 | Temp 98.3°F | Wt 156.0 lb

## 2016-10-30 DIAGNOSIS — I129 Hypertensive chronic kidney disease with stage 1 through stage 4 chronic kidney disease, or unspecified chronic kidney disease: Secondary | ICD-10-CM

## 2016-10-30 DIAGNOSIS — R5383 Other fatigue: Secondary | ICD-10-CM | POA: Diagnosis not present

## 2016-10-30 DIAGNOSIS — E1122 Type 2 diabetes mellitus with diabetic chronic kidney disease: Secondary | ICD-10-CM | POA: Diagnosis not present

## 2016-10-30 DIAGNOSIS — R0602 Shortness of breath: Secondary | ICD-10-CM | POA: Diagnosis not present

## 2016-10-30 DIAGNOSIS — N183 Chronic kidney disease, stage 3 unspecified: Secondary | ICD-10-CM

## 2016-10-30 DIAGNOSIS — E782 Mixed hyperlipidemia: Secondary | ICD-10-CM

## 2016-10-30 DIAGNOSIS — G4700 Insomnia, unspecified: Secondary | ICD-10-CM | POA: Diagnosis not present

## 2016-10-30 MED ORDER — MIRTAZAPINE 7.5 MG PO TABS: 7.5000 mg | ORAL_TABLET | Freq: Every day | ORAL | 3 refills | Status: DC

## 2016-10-30 MED ORDER — CLOPIDOGREL BISULFATE 75 MG PO TABS: 75.0000 mg | ORAL_TABLET | Freq: Every day | ORAL | 3 refills | Status: DC

## 2016-10-30 NOTE — Progress Notes (Signed)
[facility] Provider:  Cordie Beazley L. Renato Gailseed, D.O., C.M.D.  Code Status: DNR Goals of Care:  Advanced Directives 10/30/2016  Does Patient Have a Medical Advance Directive? Yes  Type of Advance Directive Out of facility DNR (pink MOST or yellow form);Living will  Does patient want to make changes to medical advance directive? -  Copy of Healthcare Power of Attorney in Chart? -  Would patient like information on creating a medical advance directive? -  Pre-existing out of facility DNR order (yellow form or pink MOST form) Yellow form placed in chart (order not valid for inpatient use)     Chief Complaint  Patient presents with  . Medical Management of Chronic Issues    3mth follow-up    HPI: Patient is a 83 y.o. female seen today for medical management of chronic diseases.    Still weak and fatigued, but labs unremarkable.  Doesn't feel like doing much.    Still gets out of breath which has been the case.  Get a pain behind her right shoulder blade as she gets fatigued.  Has a fractured vertebrae with bone spurs.  Reviewed old thoracic and lumbar xrays from 06/05/2000.    Memory remains pretty good.    Still has trouble getting to sleep at night.  Goes to be about 10pm.  Sometimes 3am until she's asleep.  Varies.  Thinks it's affected by level of activity and does not sleep that well if her activity level is low.  Taking a walk about daily and that doesn't affect her that much.  Melatonin did not help one bit.    Past Medical History:  Diagnosis Date  . Cervicalgia   . Chronic kidney disease, stage III (moderate)   . Dermatophytosis of groin and perianal area   . Hyperlipidemia LDL goal < 100   . Insomnia, unspecified   . Muscle weakness (generalized)   . Osteoarthrosis, unspecified whether generalized or localized, lower leg    osteoarthritis - upper thoracic area, left knee.  . Other abnormal glucose   . Other B-complex deficiencies   . Other malaise and fatigue   .  Palpitations   . Senile cataract, unspecified   . Shortness of breath   . Stroke [facility](HCC)    small "stroke" - right sided weakness for a day.  . Tachycardia, unspecified   . Type II or unspecified type diabetes mellitus with renal manifestations, not stated as uncontrolled(250.40)    "prediabetes"-no meds.  . Undiagnosed cardiac murmurs   . Unspecified essential hypertension   . Unspecified vitamin D deficiency     Past Surgical History:  Procedure Laterality Date  . CATARACT EXTRACTION, BILATERAL Bilateral   . EYE SURGERY     cataract  . TOE AMPUTATION  2002  . TONSILLECTOMY  1944  . TONSILLECTOMY     younger yrs  . TOTAL KNEE ARTHROPLASTY Left 01/15/2015   Procedure: LEFT TOTAL KNEE ARTHROPLASTY;  Surgeon: Eugenia Mcalpineobert Collins, MD;  Location: [facility];  Service: Orthopedics;  Laterality: Left;  . TUBAL LIGATION  1951    Allergies  Allergen Reactions  . Ace Inhibitors Cough  . Simvastatin Other (See Comments)    Body aches and pains    Allergies as of 10/30/2016      Reactions   Ace Inhibitors Cough   Simvastatin Other (See Comments)   Body aches and pains      Medication List       Accurate as of 10/30/16  3:17 PM. Always  use your most recent med list.          b complex vitamins tablet Take 1 tablet by mouth daily.   carvedilol 12.5 MG tablet Commonly known as:  COREG Take 1 tablet (12.5 mg total) by mouth 2 (two) times daily with a meal. For blood pressure   clopidogrel 75 MG tablet Commonly known as:  PLAVIX Take 1 tablet (75 mg total) by mouth daily with breakfast.   fluticasone 50 MCG/ACT nasal spray Commonly known as:  FLONASE Place 2 sprays into both nostrils daily as needed for allergies or rhinitis.   hydrochlorothiazide 25 MG tablet Commonly known as:  HYDRODIURIL Take 25 mg by mouth. Take one tablet daily as needed for edema   PRESERVISION AREDS 2+MULTI VIT PO Take 2 capsules by mouth daily.   traMADol 50 MG tablet Commonly known as:   ULTRAM Take 1 tablet (50 mg total) by mouth 2 (two) times daily as needed.   Vitamin D2 400 units Tabs Take 1 tablet by mouth daily.       Review of Systems:  Review of Systems  Constitutional: Negative for chills and fever.  HENT: Positive for hearing loss. Negative for congestion.   Eyes: Negative for blurred vision.       Glasses  Respiratory: Negative for shortness of breath.   Cardiovascular: Negative for chest pain, palpitations and leg swelling.  Gastrointestinal: Negative for abdominal pain, blood in stool, constipation and melena.  Genitourinary: Negative for dysuria.  Musculoskeletal: Positive for back pain and myalgias. Negative for falls.  Skin: Negative for itching and rash.  Neurological: Negative for dizziness and loss of consciousness.  Endo/Heme/Allergies: Bruises/bleeds easily.  Psychiatric/Behavioral: Negative for depression and memory loss. The patient does not have insomnia.     Health Maintenance  Topic Date Due  . OPHTHALMOLOGY EXAM  11/09/2015  . URINE MICROALBUMIN  07/04/2016  . HEMOGLOBIN A1C  10/15/2016  . INFLUENZA VACCINE  10/18/2016  . FOOT EXAM  04/27/2017  . TETANUS/TDAP  05/21/2021  . DEXA SCAN  Addressed  . PNA vac Low Risk Adult  Completed    Physical Exam: Vitals:   10/30/16 1453  BP: (!) 150/80  Pulse: 69  Temp: 98.3 F (36.8 C)  TempSrc: Oral  SpO2: 96%  Weight: 156 lb (70.8 kg)   Body mass index is 24.43 kg/m. Physical Exam  Constitutional: She is oriented to person, place, and time. She appears well-developed and well-nourished. No distress.  Cardiovascular: Normal rate, regular rhythm, normal heart sounds and intact distal pulses.   Pulmonary/Chest: Effort normal and breath sounds normal. No respiratory distress.  Abdominal: Soft. Bowel sounds are normal.  Musculoskeletal: Normal range of motion. She exhibits tenderness.  Walks with slight limp due to hemiparesis after stroke; tender over right posterior thoracic area  and into scapula  Neurological: She is alert and oriented to person, place, and time.  Skin: Skin is warm and dry.  Psychiatric: She has a normal mood and affect.    Labs reviewed: Basic Metabolic Panel:  Recent Labs  16/10/96 0907 10/24/16 0920  NA 142 142  K 4.0 4.5  CL 105 107  CO2 29 23  GLUCOSE 112* 111*  BUN 20 26*  CREATININE 0.97* 1.03*  CALCIUM 9.0 9.0   Liver Function Tests:  Recent Labs  04/17/16 0907 10/24/16 0920  AST 12 11  ALT 10 8  ALKPHOS 63 63  BILITOT 0.9 0.8  PROT 7.0 6.3  ALBUMIN 4.2 4.0   No results  for input(s): LIPASE, AMYLASE in the last 8760 hours. No results for input(s): AMMONIA in the last 8760 hours. CBC:  Recent Labs  04/17/16 0907 10/24/16 0920  WBC 5.6 6.5  NEUTROABS 3,920 4,680  HGB 14.3 12.8  HCT 43.0 39.7  MCV 89.4 91.9  PLT 197 181   Lipid Panel:  Recent Labs  04/17/16 0907  CHOL 205*  HDL 26*  LDLCALC 128*  TRIG 254*  CHOLHDL 7.9*   Lab Results  Component Value Date   HGBA1C 5.6 04/17/2016    Assessment/Plan 1. Hypertension, renal disease, stage 1-4 or unspecified chronic kidney disease -bp satisfactory as has h/o dizziness with bp low, cont current regimen  2. Controlled type 2 diabetes mellitus with stage 3 chronic kidney disease, without long-term current use of insulin (HCC) -cont current diet and try to keep walking for exercise, cannot take ace or statin  3. Shortness of breath -ongoing, has had full workup more than once w/o explanation  4. Fatigue, unspecified type -labs unremarkable, somewhat age-dependent, but also may be due to poor sleep at hs so will manage that  5. Mixed hyperlipidemia - clopidogrel (PLAVIX) 75 MG tablet; Take 1 tablet (75 mg total) by mouth daily with breakfast.  Dispense: 90 tablet; Refill: 3  6. Insomnia, unspecified type - not a good candidate for ambien, etc due to age and fall risks  - try low dose remeron exclusively for sleep - mirtazapine (REMERON) 7.5 MG  tablet; Take 1 tablet (7.5 mg total) by mouth at bedtime.  Dispense: 90 tablet; Refill: 3  Labs/tests ordered:  No orders of the defined types were placed in this encounter.  Next appt:  03/01/2017  Tyrece Vanterpool L. Gery Sabedra, D.O. Geriatrics [facility] [address], [place] [address] Cell Phone (Mon-Fri 8am-5pm):  [phone] On Call:  [phone] & follow prompts after 5pm & weekends Office Phone:  [phone] Office Fax:  [phone]

## 2016-11-13 DIAGNOSIS — H3561 Retinal hemorrhage, right eye: Secondary | ICD-10-CM | POA: Diagnosis not present

## 2016-11-13 DIAGNOSIS — H35351 Cystoid macular degeneration, right eye: Secondary | ICD-10-CM | POA: Diagnosis not present

## 2016-11-13 DIAGNOSIS — H348311 Tributary (branch) retinal vein occlusion, right eye, with retinal neovascularization: Secondary | ICD-10-CM | POA: Diagnosis not present

## 2016-11-13 DIAGNOSIS — H34831 Tributary (branch) retinal vein occlusion, right eye, with macular edema: Secondary | ICD-10-CM | POA: Diagnosis not present

## 2016-11-24 ENCOUNTER — Ambulatory Visit: Payer: Medicare Other | Admitting: Nurse Practitioner

## 2016-12-18 DIAGNOSIS — H3561 Retinal hemorrhage, right eye: Secondary | ICD-10-CM | POA: Diagnosis not present

## 2016-12-18 DIAGNOSIS — H34831 Tributary (branch) retinal vein occlusion, right eye, with macular edema: Secondary | ICD-10-CM | POA: Diagnosis not present

## 2016-12-18 DIAGNOSIS — H35011 Changes in retinal vascular appearance, right eye: Secondary | ICD-10-CM | POA: Diagnosis not present

## 2016-12-18 DIAGNOSIS — H35351 Cystoid macular degeneration, right eye: Secondary | ICD-10-CM | POA: Diagnosis not present

## 2017-01-22 DIAGNOSIS — H34831 Tributary (branch) retinal vein occlusion, right eye, with macular edema: Secondary | ICD-10-CM | POA: Diagnosis not present

## 2017-01-22 DIAGNOSIS — H3561 Retinal hemorrhage, right eye: Secondary | ICD-10-CM | POA: Diagnosis not present

## 2017-01-22 DIAGNOSIS — H353111 Nonexudative age-related macular degeneration, right eye, early dry stage: Secondary | ICD-10-CM | POA: Diagnosis not present

## 2017-01-22 DIAGNOSIS — H35033 Hypertensive retinopathy, bilateral: Secondary | ICD-10-CM | POA: Diagnosis not present

## 2017-01-22 DIAGNOSIS — H35351 Cystoid macular degeneration, right eye: Secondary | ICD-10-CM | POA: Diagnosis not present

## 2017-03-01 ENCOUNTER — Ambulatory Visit (INDEPENDENT_AMBULATORY_CARE_PROVIDER_SITE_OTHER): Payer: Medicare Other | Admitting: Internal Medicine

## 2017-03-01 ENCOUNTER — Other Ambulatory Visit: Payer: Self-pay | Admitting: *Deleted

## 2017-03-01 ENCOUNTER — Encounter: Payer: Self-pay | Admitting: Internal Medicine

## 2017-03-01 ENCOUNTER — Other Ambulatory Visit: Payer: Self-pay | Admitting: Internal Medicine

## 2017-03-01 VITALS — BP 150/88 | HR 69 | Temp 97.7°F | Wt 158.0 lb

## 2017-03-01 DIAGNOSIS — R300 Dysuria: Secondary | ICD-10-CM

## 2017-03-01 DIAGNOSIS — R58 Hemorrhage, not elsewhere classified: Secondary | ICD-10-CM

## 2017-03-01 DIAGNOSIS — K648 Other hemorrhoids: Secondary | ICD-10-CM

## 2017-03-01 DIAGNOSIS — M1711 Unilateral primary osteoarthritis, right knee: Secondary | ICD-10-CM | POA: Diagnosis not present

## 2017-03-01 DIAGNOSIS — N3001 Acute cystitis with hematuria: Secondary | ICD-10-CM | POA: Diagnosis not present

## 2017-03-01 LAB — CBC WITH DIFFERENTIAL/PLATELET
Basophils Absolute: 40 cells/uL (ref 0–200)
Basophils Relative: 0.6 %
Eosinophils Absolute: 132 cells/uL (ref 15–500)
Eosinophils Relative: 2 %
HCT: 38 % (ref 35.0–45.0)
Hemoglobin: 12.6 g/dL (ref 11.7–15.5)
Lymphs Abs: 1267 cells/uL (ref 850–3900)
MCH: 28.6 pg (ref 27.0–33.0)
MCHC: 33.2 g/dL (ref 32.0–36.0)
MCV: 86.2 fL (ref 80.0–100.0)
MPV: 10.7 fL (ref 7.5–12.5)
Monocytes Relative: 6.5 %
Neutro Abs: 4732 cells/uL (ref 1500–7800)
Neutrophils Relative %: 71.7 %
Platelets: 214 10*3/uL (ref 140–400)
RBC: 4.41 10*6/uL (ref 3.80–5.10)
RDW: 13 % (ref 11.0–15.0)
Total Lymphocyte: 19.2 %
WBC mixed population: 429 cells/uL (ref 200–950)
WBC: 6.6 10*3/uL (ref 3.8–10.8)

## 2017-03-01 LAB — POCT URINALYSIS DIPSTICK
Bilirubin, UA: NEGATIVE
Glucose, UA: NEGATIVE
Ketones, UA: NEGATIVE
Nitrite, UA: NEGATIVE
Spec Grav, UA: 1.015 (ref 1.010–1.025)
Urobilinogen, UA: NEGATIVE E.U./dL — AB
pH, UA: 6 (ref 5.0–8.0)

## 2017-03-01 MED ORDER — TRAMADOL HCL 50 MG PO TABS: 50.0000 mg | ORAL_TABLET | Freq: Two times a day (BID) | ORAL | 3 refills | Status: DC | PRN

## 2017-03-01 MED ORDER — CIPROFLOXACIN HCL 500 MG PO TABS: 500.0000 mg | ORAL_TABLET | Freq: Two times a day (BID) | ORAL | 0 refills | Status: DC

## 2017-03-01 NOTE — Patient Instructions (Addendum)
I have sent cipro to your pharmacy for your urinary tract infection.  Drink plenty of water and cranberry juice.  Also eat yogurt while you are on the antibiotics.    Let me know if you continue to have problems with bleeding hemorrhoids.

## 2017-03-01 NOTE — Progress Notes (Signed)
[facility] Provider:  Terence Googe L. Renato Gailseed, D.O., C.M.D.  Code Status: DNR Goals of Care:  Advanced Directives 10/30/2016  Does Patient Have a Medical Advance Directive? Yes  Type of Advance Directive Out of facility DNR (pink MOST or yellow form);Living will  Does patient want to make changes to medical advance directive? -  Copy of Healthcare Power of Attorney in Chart? -  Would patient like information on creating a medical advance directive? -  Pre-existing out of facility DNR order (yellow form or pink MOST form) Yellow form placed in chart (order not valid for inpatient use)     Chief Complaint  Patient presents with  . Medical Management of Chronic Issues    4mth follow-up    HPI: Patient is a 83 y.o. female seen today for medical management of chronic diseases and c/o difficulty passing urine, dysuria and foul odor to urine.   Started a couple of days ago.    Yesterday, she found fresh blood when she wiped.  No more came after that.  No clots.  Soft normal stool.   Her husband had bleeding ulcers so she knows what black stools look like.  There was no blood in stool, just on paper and she believes she has hemorrhoids.    Had two colds this winter.  Got her flu shot in October.    No falls.    Nothing hurts besides her usual aches and pains in knees. Is out of tramadol which she uses when it gets bad enough.  [place] youngest [place] stage IV lung cancer and he's doing fine with treatments fortunately.  Has 5 treatments until they know next steps.  She says the remeron has helped her feel more relaxed when she goes to bed.  Doesn't necessarily put her to sleep itself.  She's been down to see him before his treatments started.  He's planning to come home over the holidays.  [place] and will see all but one great grandchild.    Still getting shots in her eye for her occlusion.  She's up to 20/25.    Past Medical History:  Diagnosis Date    . Cervicalgia   . Chronic kidney disease, stage III (moderate) (HCC)   . Dermatophytosis of groin and perianal area   . Hyperlipidemia LDL goal < 100   . Insomnia, unspecified   . Muscle weakness (generalized)   . Osteoarthrosis, unspecified whether generalized or localized, lower leg    osteoarthritis - upper thoracic area, left knee.  . Other abnormal glucose   . Other B-complex deficiencies   . Other malaise and fatigue   . Palpitations   . Senile cataract, unspecified   . Shortness of breath   . Stroke [facility](HCC)    small "stroke" - right sided weakness for a day.  . Tachycardia, unspecified   . Type II or unspecified type diabetes mellitus with renal manifestations, not stated as uncontrolled(250.40)    "prediabetes"-no meds.  . Undiagnosed cardiac murmurs   . Unspecified essential hypertension   . Unspecified vitamin D deficiency     Past Surgical History:  Procedure Laterality Date  . CATARACT EXTRACTION, BILATERAL Bilateral   . EYE SURGERY     cataract  . TOE AMPUTATION  2002  . TONSILLECTOMY  1944  . TONSILLECTOMY     younger yrs  . TOTAL KNEE ARTHROPLASTY Left 01/15/2015   Procedure: LEFT TOTAL KNEE ARTHROPLASTY;  Surgeon: Eugenia Mcalpineobert Collins,  MD;  Location: [facility];  Service: Orthopedics;  Laterality: Left;  . TUBAL LIGATION  1951    Allergies  Allergen Reactions  . Ace Inhibitors Cough  . Simvastatin Other (See Comments)    Body aches and pains    Outpatient Encounter Medications as of 03/01/2017  Medication Sig  . b complex vitamins tablet Take 1 tablet by mouth daily.  . carvedilol (COREG) 12.5 MG tablet Take 1 tablet (12.5 mg total) by mouth 2 (two) times daily with a meal. For blood pressure  . clopidogrel (PLAVIX) 75 MG tablet Take 1 tablet (75 mg total) by mouth daily with breakfast.  . Ergocalciferol (VITAMIN D2) 400 UNITS TABS Take 1 tablet by mouth daily.   . fluticasone (FLONASE) 50 MCG/ACT nasal spray Place 2 sprays into both nostrils daily as needed  for allergies or rhinitis.   . hydrochlorothiazide (HYDRODIURIL) 25 MG tablet Take 25 mg by mouth. Take one tablet daily as needed for edema  . mirtazapine (REMERON) 7.5 MG tablet Take 1 tablet (7.5 mg total) by mouth at bedtime.  . Multiple Vitamins-Minerals (PRESERVISION AREDS 2+MULTI VIT PO) Take 2 capsules by mouth daily.  . traMADol (ULTRAM) 50 MG tablet Take 1 tablet (50 mg total) by mouth 2 (two) times daily as needed.   No facility-administered encounter medications on file as of 03/01/2017.     Review of Systems:  Review of Systems  Constitutional: Positive for malaise/fatigue. Negative for chills and fever.  HENT: Positive for hearing loss. Negative for congestion.        Hearing aids  Eyes:       Glasses; vision improving with injections for arterial occlusion  Respiratory: Positive for shortness of breath. Negative for cough and wheezing.   Cardiovascular: Negative for chest pain, palpitations and leg swelling.  Gastrointestinal: Negative for abdominal pain, blood in stool, constipation, diarrhea and melena.       Blood on toilet paper  Genitourinary: Negative for dysuria.  Musculoskeletal: Positive for joint pain. Negative for falls and myalgias.  Skin: Negative for itching and rash.  Neurological: Negative for dizziness, loss of consciousness and weakness.  Endo/Heme/Allergies: Does not bruise/bleed easily.  Psychiatric/Behavioral: Negative for depression and memory loss. The patient has insomnia. The patient is not nervous/anxious.        Remeron helping and also calming her with son's cancer    Health Maintenance  Topic Date Due  . OPHTHALMOLOGY EXAM  11/09/2015  . URINE MICROALBUMIN  07/04/2016  . HEMOGLOBIN A1C  10/15/2016  . FOOT EXAM  04/27/2017  . TETANUS/TDAP  05/21/2021  . INFLUENZA VACCINE  Completed  . PNA vac Low Risk Adult  Completed  . DEXA SCAN  Addressed    Physical Exam: Vitals:   03/01/17 1452  BP: (!) 150/88  Pulse: 69  Temp: 97.7 F  (36.5 C)  TempSrc: Oral  SpO2: 98%  Weight: 158 lb (71.7 kg)   Body mass index is 24.75 kg/m. Physical Exam  Constitutional: She is oriented to person, place, and time. She appears well-developed and well-nourished. No distress.  HENT:  Head: Normocephalic and atraumatic.  Eyes:  glasses  Neck: Neck supple.  Cardiovascular: Normal rate, regular rhythm, normal heart sounds and intact distal pulses.  Pulmonary/Chest: Effort normal and breath sounds normal. No respiratory distress.  Abdominal: Soft. Bowel sounds are normal. She exhibits no distension and no mass. There is no tenderness. There is no guarding.  Genitourinary: Rectal exam shows guaiac positive stool.  Genitourinary Comments: Some bright  red blood in anus/rectum on exam, no palpable or visible hemorrhoids  Musculoskeletal: Normal range of motion.  Uses cane since prior stroke and knee replacement  Neurological: She is alert and oriented to person, place, and time.  Skin: Skin is warm and dry. Capillary refill takes less than 2 seconds.  Psychiatric: She has a normal mood and affect. Her behavior is normal. Judgment and thought content normal.    Labs reviewed: Basic Metabolic Panel: Recent Labs    04/17/16 0907 10/24/16 0920  NA 142 142  K 4.0 4.5  CL 105 107  CO2 29 23  GLUCOSE 112* 111*  BUN 20 26*  CREATININE 0.97* 1.03*  CALCIUM 9.0 9.0   Liver Function Tests: Recent Labs    04/17/16 0907 10/24/16 0920  AST 12 11  ALT 10 8  ALKPHOS 63 63  BILITOT 0.9 0.8  PROT 7.0 6.3  ALBUMIN 4.2 4.0   No results for input(s): LIPASE, AMYLASE in the last 8760 hours. No results for input(s): AMMONIA in the last 8760 hours. CBC: Recent Labs    04/17/16 0907 10/24/16 0920  WBC 5.6 6.5  NEUTROABS 3,920 4,680  HGB 14.3 12.8  HCT 43.0 39.7  MCV 89.4 91.9  PLT 197 181   Lipid Panel: Recent Labs    04/17/16 0907  CHOL 205*  HDL 26*  LDLCALC 128*  TRIG 254*  CHOLHDL 7.9*   Lab Results  Component  Value Date   HGBA1C 5.6 04/17/2016   Assessment/Plan 1. Dysuria and frequency - suspect UTI -POC Urinalysis Dipstick - Urine Culture - will tx due to overt symptoms, also to avoid urgent care or ED trip at holidays--will see if bacteria susceptible - ciprofloxacin (CIPRO) 500 MG tablet; Take 1 tablet (500 mg total) by mouth 2 (two) times daily.  Dispense: 14 tablet; Refill: 0  2. Primary osteoarthritis of right knee -stable, had left tka, but not right so chronic pain - traMADol (ULTRAM) 50 MG tablet; Take 1 tablet (50 mg total) by mouth 2 (two) times daily as needed.  Dispense: 60 tablet; Refill: 3  3. Blood on toilet paper - also in stool in rectal vault -pt insists she's fine and refuses further assessment beside the DRE and cbc -advised to call me if this worsens, recurs -she also is clear she does not want aggressive testing and workups overall or surgeries and DNR code status--she is [age] -may have some internal hemorrhoids I'm unable to palpate - CBC with Differential/Platelet; Future  4. Internal hemorrhoids -suspect cause of bleeding, will r/o any acute blood loss anemia which would warrant workup  5. Acute cystitis with hematuria - ciprofloxacin (CIPRO) 500 MG tablet; Take 1 tablet (500 mg total) by mouth 2 (two) times daily.  Dispense: 14 tablet; Refill: 0 -yogurt daily while on abx, hydrate (she's never good at this) and rest  Labs/tests ordered:   Orders Placed This Encounter  Procedures  . Urine Culture  . CBC with Differential/Platelet    Standing Status:   Future    Number of Occurrences:   1    Standing Expiration Date:   10/30/2017  . POC Urinalysis Dipstick   Next appt:  4mos med mgt, but call sooner if more bleeding or abdominal pain   Cumi Sanagustin L. Ginnette Gates, D.O. Geriatrics [facility] [address] [place], [place] [address] Cell Phone (Mon-Fri 8am-5pm):  [phone] On Call:  [phone] & follow prompts after 5pm &  weekends Office Phone:  [phone]  Office Fax:  [phone]

## 2017-03-02 LAB — URINE CULTURE
MICRO NUMBER:: 81404400
SPECIMEN QUALITY:: ADEQUATE

## 2017-03-05 DIAGNOSIS — H34831 Tributary (branch) retinal vein occlusion, right eye, with macular edema: Secondary | ICD-10-CM | POA: Diagnosis not present

## 2017-03-05 DIAGNOSIS — H348311 Tributary (branch) retinal vein occlusion, right eye, with retinal neovascularization: Secondary | ICD-10-CM | POA: Diagnosis not present

## 2017-03-05 DIAGNOSIS — H35351 Cystoid macular degeneration, right eye: Secondary | ICD-10-CM | POA: Diagnosis not present

## 2017-03-05 DIAGNOSIS — H353111 Nonexudative age-related macular degeneration, right eye, early dry stage: Secondary | ICD-10-CM | POA: Diagnosis not present

## 2017-03-22 ENCOUNTER — Telehealth: Payer: Self-pay | Admitting: Internal Medicine

## 2017-03-22 NOTE — Telephone Encounter (Signed)
I called the pt to schedule AWV-S on or after 04/18/17.  There was no answer and no option to leave a message. VDM (DD)

## 2017-04-03 DIAGNOSIS — H3561 Retinal hemorrhage, right eye: Secondary | ICD-10-CM | POA: Diagnosis not present

## 2017-04-03 DIAGNOSIS — H35351 Cystoid macular degeneration, right eye: Secondary | ICD-10-CM | POA: Diagnosis not present

## 2017-04-03 DIAGNOSIS — H35033 Hypertensive retinopathy, bilateral: Secondary | ICD-10-CM | POA: Diagnosis not present

## 2017-04-03 DIAGNOSIS — H34831 Tributary (branch) retinal vein occlusion, right eye, with macular edema: Secondary | ICD-10-CM | POA: Diagnosis not present

## 2017-04-13 DIAGNOSIS — H34831 Tributary (branch) retinal vein occlusion, right eye, with macular edema: Secondary | ICD-10-CM | POA: Diagnosis not present

## 2017-05-17 ENCOUNTER — Other Ambulatory Visit: Payer: Self-pay | Admitting: Internal Medicine

## 2017-05-17 DIAGNOSIS — I129 Hypertensive chronic kidney disease with stage 1 through stage 4 chronic kidney disease, or unspecified chronic kidney disease: Secondary | ICD-10-CM

## 2017-05-28 DIAGNOSIS — H353111 Nonexudative age-related macular degeneration, right eye, early dry stage: Secondary | ICD-10-CM | POA: Diagnosis not present

## 2017-05-28 DIAGNOSIS — H34831 Tributary (branch) retinal vein occlusion, right eye, with macular edema: Secondary | ICD-10-CM | POA: Diagnosis not present

## 2017-05-28 DIAGNOSIS — H3561 Retinal hemorrhage, right eye: Secondary | ICD-10-CM | POA: Diagnosis not present

## 2017-05-28 DIAGNOSIS — H35351 Cystoid macular degeneration, right eye: Secondary | ICD-10-CM | POA: Diagnosis not present

## 2017-06-12 ENCOUNTER — Encounter: Payer: Self-pay | Admitting: Nurse Practitioner

## 2017-06-12 ENCOUNTER — Ambulatory Visit (INDEPENDENT_AMBULATORY_CARE_PROVIDER_SITE_OTHER): Payer: Medicare Other | Admitting: Nurse Practitioner

## 2017-06-12 ENCOUNTER — Other Ambulatory Visit: Payer: Self-pay

## 2017-06-12 VITALS — BP 152/84 | HR 62 | Temp 97.9°F | Ht 67.0 in | Wt 152.0 lb

## 2017-06-12 DIAGNOSIS — R300 Dysuria: Secondary | ICD-10-CM

## 2017-06-12 LAB — POCT URINALYSIS DIPSTICK
Bilirubin, UA: NEGATIVE
Glucose, UA: NEGATIVE
KETONES UA: NEGATIVE
NITRITE UA: NEGATIVE
PH UA: 6 (ref 5.0–8.0)
SPEC GRAV UA: 1.02 (ref 1.010–1.025)
UROBILINOGEN UA: 0.2 U/dL

## 2017-06-12 MED ORDER — AMOXICILLIN-POT CLAVULANATE 875-125 MG PO TABS: 1.0000 | ORAL_TABLET | Freq: Two times a day (BID) | ORAL | 0 refills | Status: DC

## 2017-06-12 NOTE — Progress Notes (Signed)
Careteam: Patient Care Team: Kermit Baloeed, Tiffany L, DO as PCP - General (Geriatric Medicine) Eugenia Mcalpineollins, Robert, MD as Consulting Physician (Orthopedic Surgery) Marvel PlanXu, Jindong, MD as Consulting Physician (Neurology)  Advanced Directive information    Allergies  Allergen Reactions  . Ace Inhibitors Cough  . Simvastatin Other (See Comments)    Body aches and pains    Chief Complaint  Patient presents with  . Acute Visit    Pt is being seen due to dysuria for about a week.      HPI: Patient is a 84 y.o. female seen in the office today due to possible UTI, reports pain when she has to urination x 1 week and now a touch of pain in her vagina. Feeling of constant urination and has almost lost control of her bladder several times.  No fever or chills.  No flank pain.  Reports recurrent UTI years ago, then went a long time without. Had UTI ~3 months ago.   Review of Systems:  Review of Systems  Constitutional: Negative for chills, fever and malaise/fatigue.  Respiratory: Negative for sputum production and shortness of breath.   Cardiovascular: Negative for chest pain.  Gastrointestinal: Negative for abdominal pain, constipation, diarrhea, nausea and vomiting.  Genitourinary: Positive for dysuria, flank pain, frequency and urgency. Negative for hematuria.    Past Medical History:  Diagnosis Date  . Cervicalgia   . Chronic kidney disease, stage III (moderate) (HCC)   . Dermatophytosis of groin and perianal area   . Hyperlipidemia LDL goal < 100   . Insomnia, unspecified   . Muscle weakness (generalized)   . Osteoarthrosis, unspecified whether generalized or localized, lower leg    osteoarthritis - upper thoracic area, left knee.  . Other abnormal glucose   . Other B-complex deficiencies   . Other malaise and fatigue   . Palpitations   . Senile cataract, unspecified   . Shortness of breath   . Stroke [facility](HCC)    small "stroke" - right sided weakness for a day.  . Tachycardia,  unspecified   . Type II or unspecified type diabetes mellitus with renal manifestations, not stated as uncontrolled(250.40)    "prediabetes"-no meds.  . Undiagnosed cardiac murmurs   . Unspecified essential hypertension   . Unspecified vitamin D deficiency    Past Surgical History:  Procedure Laterality Date  . CATARACT EXTRACTION, BILATERAL Bilateral   . EYE SURGERY     cataract  . TOE AMPUTATION  2002  . TONSILLECTOMY  1944  . TONSILLECTOMY     younger yrs  . TOTAL KNEE ARTHROPLASTY Left 01/15/2015   Procedure: LEFT TOTAL KNEE ARTHROPLASTY;  Surgeon: Eugenia Mcalpineobert Collins, MD;  Location: [facility];  Service: Orthopedics;  Laterality: Left;  . TUBAL LIGATION  1951   Social History:   reports that she has never smoked. She has never used smokeless tobacco. She reports that she drinks alcohol. She reports that she does not use drugs.  Family History  Problem Relation Age of Onset  . Cerebral aneurysm Mother   . Heart attack Father   . Alzheimer's disease Sister   . Emphysema Sister   . Stroke Sister   . Alzheimer's disease Sister   . Arthritis Sister     Medications: Patient's Medications  New Prescriptions   No medications on file  Previous Medications   B COMPLEX VITAMINS TABLET    Take 1 tablet by mouth daily.   CARVEDILOL (COREG) 12.5 MG TABLET    TAKE ONE TABLET BY  MOUTH TWICE DAILY WITH MEALS FOR BLOOD PRESSURE   CLOPIDOGREL (PLAVIX) 75 MG TABLET    Take 1 tablet (75 mg total) by mouth daily with breakfast.   ERGOCALCIFEROL (VITAMIN D2) 400 UNITS TABS    Take 1 tablet by mouth daily.    FLUTICASONE (FLONASE) 50 MCG/ACT NASAL SPRAY    Place 2 sprays into both nostrils daily as needed for allergies or rhinitis.    HYDROCHLOROTHIAZIDE (HYDRODIURIL) 25 MG TABLET    Take 25 mg by mouth. Take one tablet daily as needed for edema   MIRTAZAPINE (REMERON) 7.5 MG TABLET    Take 1 tablet (7.5 mg total) by mouth at bedtime.   MULTIPLE VITAMINS-MINERALS (PRESERVISION AREDS 2+MULTI VIT PO)     Take 2 capsules by mouth daily.   TRAMADOL (ULTRAM) 50 MG TABLET    Take 1 tablet (50 mg total) by mouth 2 (two) times daily as needed.  Modified Medications   No medications on file  Discontinued Medications   CIPROFLOXACIN (CIPRO) 500 MG TABLET    Take 1 tablet (500 mg total) by mouth 2 (two) times daily.     Physical Exam:  Vitals:   06/12/17 1010  BP: (!) 152/84  Pulse: 62  Temp: 97.9 F (36.6 C)  TempSrc: Oral  SpO2: 99%  Weight: 152 lb (68.9 kg)  Height: 5\' 7"  (1.702 m)   Body mass index is 23.81 kg/m.  Physical Exam  Constitutional: She is oriented to person, place, and time. She appears well-developed and well-nourished.  Cardiovascular: Normal rate, regular rhythm and normal heart sounds.  Pulmonary/Chest: Effort normal and breath sounds normal.  Abdominal: Soft. Bowel sounds are normal. There is tenderness (suprapubic). There is no CVA tenderness.  Neurological: She is alert and oriented to person, place, and time.    Labs reviewed: Basic Metabolic Panel: Recent Labs    10/24/16 0920  NA 142  K 4.5  CL 107  CO2 23  GLUCOSE 111*  BUN 26*  CREATININE 1.03*  CALCIUM 9.0   Liver Function Tests: Recent Labs    10/24/16 0920  AST 11  ALT 8  ALKPHOS 63  BILITOT 0.8  PROT 6.3  ALBUMIN 4.0   No results for input(s): LIPASE, AMYLASE in the last 8760 hours. No results for input(s): AMMONIA in the last 8760 hours. CBC: Recent Labs    10/24/16 0920 03/01/17 1536  WBC 6.5 6.6  NEUTROABS 4,680 4,732  HGB 12.8 12.6  HCT 39.7 38.0  MCV 91.9 86.2  PLT 181 214   Lipid Panel: No results for input(s): CHOL, HDL, LDLCALC, TRIG, CHOLHDL, LDLDIRECT in the last 8760 hours. TSH: No results for input(s): TSH in the last 8760 hours. A1C: Lab Results  Component Value Date   HGBA1C 5.6 04/17/2016     Assessment/Plan 1. Dysuria - POCT urinalysis dipstick- abnormal with leukocytes and blood will send for culture  - Culture, Urine; Future -  amoxicillin-clavulanate (AUGMENTIN) 875-125 MG tablet; Take 1 tablet by mouth 2 (two) times daily.  Dispense: 14 tablet; Refill: 0 -to take with full glass of water - probiotic BID  Next appt: 07/02/2017 Janene Harvey. Biagio Borg  [facility] [phone]

## 2017-06-12 NOTE — Patient Instructions (Signed)
Augmentin twice daily for 1 week -- take with full glass of water Take with probiotic to help avoid GI upset   To increase fluid intake

## 2017-06-13 LAB — URINE CULTURE
MICRO NUMBER: 90376084
SPECIMEN QUALITY: ADEQUATE

## 2017-06-14 ENCOUNTER — Encounter: Payer: Self-pay | Admitting: Internal Medicine

## 2017-06-14 ENCOUNTER — Ambulatory Visit (INDEPENDENT_AMBULATORY_CARE_PROVIDER_SITE_OTHER): Payer: Medicare Other | Admitting: Internal Medicine

## 2017-06-14 VITALS — BP 138/80 | HR 74 | Temp 97.4°F | Ht 67.0 in | Wt 155.0 lb

## 2017-06-14 DIAGNOSIS — R300 Dysuria: Secondary | ICD-10-CM

## 2017-06-14 DIAGNOSIS — N904 Leukoplakia of vulva: Secondary | ICD-10-CM

## 2017-06-14 NOTE — Progress Notes (Signed)
Location:  [facility] clinic Provider: Mickal Meno L. Renato Gails, D.O., C.M.D.  Code Status: DNR Goals of Care:  Advanced Directives 06/14/2017  Does Patient Have a Medical Advance Directive? Yes  Type of Estate agent of [place];Living will;Out of facility DNR (pink MOST or yellow form)  Does patient want to make changes to medical advance directive? No - Patient declined  Copy of Healthcare Power of Attorney in Chart? Yes  Would patient like information on creating a medical advance directive? -  Pre-existing out of facility DNR order (yellow form or pink MOST form) Yellow form placed in chart (order not valid for inpatient use)     Chief Complaint  Patient presents with  . Acute Visit    discuss referral to urology    HPI: Patient is a 84 y.o. female seen today for an acute visit for burning when she urinates, weakness and shakiness.  She's been trying to drink more water and cranberry juice.  Started the middle of last week.  Came Monday and was started on augmentin and does not feel better.  Has almost lost control of her bladder.  When she stands, she can't control it.  Worse for the last month or so.  Had leakage before that she could handle with a light pad, but now needing large pad.  She has had 5 children and knows she is 19.  She has not seen blood in her urine.  Says she has some varicose veins in her peri-area.  Says she feels them when she stands, but does not notice a change.    Past Medical History:  Diagnosis Date  . Cervicalgia   . Chronic kidney disease, stage III (moderate) (HCC)   . Dermatophytosis of groin and perianal area   . Hyperlipidemia LDL goal < 100   . Insomnia, unspecified   . Muscle weakness (generalized)   . Osteoarthrosis, unspecified whether generalized or localized, lower leg    osteoarthritis - upper thoracic area, left knee.  . Other abnormal glucose   . Other B-complex deficiencies   . Other malaise and fatigue   . Palpitations   .  Senile cataract, unspecified   . Shortness of breath   . Stroke [facility])    small "stroke" - right sided weakness for a day.  . Tachycardia, unspecified   . Type II or unspecified type diabetes mellitus with renal manifestations, not stated as uncontrolled(250.40)    "prediabetes"-no meds.  . Undiagnosed cardiac murmurs   . Unspecified essential hypertension   . Unspecified vitamin D deficiency     Past Surgical History:  Procedure Laterality Date  . CATARACT EXTRACTION, BILATERAL Bilateral   . EYE SURGERY     cataract  . TOE AMPUTATION  2002  . TONSILLECTOMY  1944  . TONSILLECTOMY     younger yrs  . TOTAL KNEE ARTHROPLASTY Left 01/15/2015   Procedure: LEFT TOTAL KNEE ARTHROPLASTY;  Surgeon: Eugenia Mcalpine, MD;  Location: [facility];  Service: Orthopedics;  Laterality: Left;  . TUBAL LIGATION  1951    Allergies  Allergen Reactions  . Ace Inhibitors Cough  . Simvastatin Other (See Comments)    Body aches and pains    Outpatient Encounter Medications as of 06/14/2017  Medication Sig  . amoxicillin-clavulanate (AUGMENTIN) 875-125 MG tablet Take 1 tablet by mouth 2 (two) times daily.  Marland Kitchen b complex vitamins tablet Take 1 tablet by mouth daily.  . carvedilol (COREG) 12.5 MG tablet TAKE ONE TABLET BY MOUTH TWICE DAILY WITH  MEALS FOR BLOOD PRESSURE  . clopidogrel (PLAVIX) 75 MG tablet Take 1 tablet (75 mg total) by mouth daily with breakfast.  . Ergocalciferol (VITAMIN D2) 400 UNITS TABS Take 1 tablet by mouth daily.   . fluticasone (FLONASE) 50 MCG/ACT nasal spray Place 2 sprays into both nostrils daily as needed for allergies or rhinitis.   . hydrochlorothiazide (HYDRODIURIL) 25 MG tablet Take 25 mg by mouth. Take one tablet daily as needed for edema  . mirtazapine (REMERON) 7.5 MG tablet Take 1 tablet (7.5 mg total) by mouth at bedtime.  . Multiple Vitamins-Minerals (PRESERVISION AREDS 2+MULTI VIT PO) Take 2 capsules by mouth daily.  . traMADol (ULTRAM) 50 MG tablet Take 1 tablet (50 mg  total) by mouth 2 (two) times daily as needed.   No facility-administered encounter medications on file as of 06/14/2017.     Review of Systems:  Review of Systems  Constitutional: Positive for malaise/fatigue. Negative for chills and fever.  HENT: Negative for congestion.   Eyes: Negative for blurred vision.  Respiratory: Negative for shortness of breath.   Cardiovascular: Negative for chest pain and palpitations.  Gastrointestinal: Negative for abdominal pain.  Genitourinary: Positive for dysuria. Negative for flank pain, frequency, hematuria and urgency.       Leakage  Musculoskeletal: Negative for joint pain.  Neurological: Negative for dizziness and loss of consciousness.  Psychiatric/Behavioral: Negative for depression and memory loss.    Health Maintenance  Topic Date Due  . OPHTHALMOLOGY EXAM  11/09/2015  . URINE MICROALBUMIN  07/04/2016  . HEMOGLOBIN A1C  10/15/2016  . FOOT EXAM  04/27/2017  . TETANUS/TDAP  05/21/2021  . INFLUENZA VACCINE  Completed  . PNA vac Low Risk Adult  Completed  . DEXA SCAN  Addressed    Physical Exam: Vitals:   06/14/17 1405  BP: 138/80  Pulse: 74  Temp: (!) 97.4 F (36.3 C)  TempSrc: Oral  SpO2: 97%  Weight: 155 lb (70.3 kg)  Height: 5\' 7"  (1.702 m)   Body mass index is 24.28 kg/m. Physical Exam  Constitutional: She is oriented to person, place, and time. She appears well-developed and well-nourished. No distress.  Cardiovascular: Normal rate, regular rhythm and intact distal pulses.  Murmur heard. Pulmonary/Chest: Effort normal and breath sounds normal. No respiratory distress.  Abdominal: Soft. Bowel sounds are normal. She exhibits no distension. There is no tenderness.  Genitourinary: No vaginal discharge found.  Genitourinary Comments: Labia with white irregular appearance, vagina with extreme erythema, quite tender during exam  Musculoskeletal: Normal range of motion.  Neurological: She is alert and oriented to person,  place, and time.  Psychiatric: She has a normal mood and affect.    Labs reviewed: Basic Metabolic Panel: Recent Labs    10/24/16 0920  NA 142  K 4.5  CL 107  CO2 23  GLUCOSE 111*  BUN 26*  CREATININE 1.03*  CALCIUM 9.0   Liver Function Tests: Recent Labs    10/24/16 0920  AST 11  ALT 8  ALKPHOS 63  BILITOT 0.8  PROT 6.3  ALBUMIN 4.0   No results for input(s): LIPASE, AMYLASE in the last 8760 hours. No results for input(s): AMMONIA in the last 8760 hours. CBC: Recent Labs    10/24/16 0920 03/01/17 1536  WBC 6.5 6.6  NEUTROABS 4,680 4,732  HGB 12.8 12.6  HCT 39.7 38.0  MCV 91.9 86.2  PLT 181 214   Lipid Panel: No results for input(s): CHOL, HDL, LDLCALC, TRIG, CHOLHDL, LDLDIRECT in the last  8760 hours. Lab Results  Component Value Date   HGBA1C 5.6 04/17/2016    Assessment/Plan 1. Dysuria - has now had two UAs with c+s w/o growth on culture -does not appear to be typical atrophic vaginitis -appears to have lichen sclerosis, but erythema is extreme--?maligancy with her associated weakness and malaise vs simply due to irritation from urinary leakage - Ambulatory referral to Urogynecology  2. Lichen sclerosus of female genitalia - concerning appearance--will get opinion of urogyn - Ambulatory referral to Urogynecology  Labs/tests ordered:   Orders Placed This Encounter  Procedures  . Ambulatory referral to Urogynecology    Referral Priority:   Routine    Referral Type:   Consultation    Referral Reason:   Specialty Services Required    Requested Specialty:   Urology    Number of Visits Requested:   1    Next appt:  07/02/2017  Ranyah Groeneveld L. Analis Distler, D.O. Geriatrics [facility] [address], [place] [address] Cell Phone (Mon-Fri 8am-5pm):  [phone] On Call:  [phone] & follow prompts after 5pm & weekends Office Phone:  [phone] Office Fax:  [phone]

## 2017-07-02 ENCOUNTER — Encounter: Payer: Self-pay | Admitting: Internal Medicine

## 2017-07-02 ENCOUNTER — Ambulatory Visit (INDEPENDENT_AMBULATORY_CARE_PROVIDER_SITE_OTHER): Payer: Medicare Other

## 2017-07-02 ENCOUNTER — Ambulatory Visit (INDEPENDENT_AMBULATORY_CARE_PROVIDER_SITE_OTHER): Payer: Medicare Other | Admitting: Internal Medicine

## 2017-07-02 VITALS — BP 140/88 | HR 71 | Temp 97.6°F | Ht 67.0 in | Wt 156.0 lb

## 2017-07-02 VITALS — BP 168/84 | HR 71 | Temp 97.6°F | Ht 67.0 in | Wt 156.0 lb

## 2017-07-02 DIAGNOSIS — N904 Leukoplakia of vulva: Secondary | ICD-10-CM | POA: Diagnosis not present

## 2017-07-02 DIAGNOSIS — R300 Dysuria: Secondary | ICD-10-CM

## 2017-07-02 DIAGNOSIS — L570 Actinic keratosis: Secondary | ICD-10-CM | POA: Diagnosis not present

## 2017-07-02 DIAGNOSIS — Z0000 Encounter for general adult medical examination without abnormal findings: Secondary | ICD-10-CM | POA: Diagnosis not present

## 2017-07-02 DIAGNOSIS — I129 Hypertensive chronic kidney disease with stage 1 through stage 4 chronic kidney disease, or unspecified chronic kidney disease: Secondary | ICD-10-CM

## 2017-07-02 NOTE — Progress Notes (Signed)
Location:  [facility] clinic Provider:  Tiffany L. Renato Gails, D.O., C.M.D.  Code Status: DNR  Goals of Care:  Advanced Directives 07/02/2017  Does Patient Have a Medical Advance Directive? Yes  Type of Estate agent of [place];Living will;Out of facility DNR (pink MOST or yellow form)  Does patient want to make changes to medical advance directive? No - Patient declined  Copy of Healthcare Power of Attorney in Chart? Yes  Would patient like information on creating a medical advance directive? -  Pre-existing out of facility DNR order (yellow form or pink MOST form) Yellow form placed in chart (order not valid for inpatient use)     Chief Complaint  Patient presents with  . Medical Management of Chronic Issues    follow-up  . ACP    LIVING WILL, HCPOA, DNR    HPI: Patient is a 84 y.o. female seen today for medical management of chronic diseases.  Overall feeling better. She is still has some discomfort with urination, but this has improved from her visit in March. She is awaiting urologist appt, on Thursday May the 2nd at 1 o'clock. She reports it is more tolerable now. She did not do any treatments for this from the last visit to now.   She reports sleeping trouble, but this is her normal. She denies eating or swallowing difficulties. She denies bladder or bowel changes. She has some skin changes on her face-she denies seeing a dermatologist in the past. She does not have pain associated with the lesion, aside from it being unsightly to her. She denies memory changes. She is still driving. She is living alone and able to take care of all her finances and ADLs, and IADL's. She has not had any falls, does use her cane, no walker. She does not use her cane at home.    Past Medical History:  Diagnosis Date  . Cervicalgia   . Chronic kidney disease, stage III (moderate) (HCC)   . Dermatophytosis of groin and perianal area   . Hyperlipidemia LDL goal < 100   . Insomnia,  unspecified   . Muscle weakness (generalized)   . Osteoarthrosis, unspecified whether generalized or localized, lower leg    osteoarthritis - upper thoracic area, left knee.  . Other abnormal glucose   . Other B-complex deficiencies   . Other malaise and fatigue   . Palpitations   . Senile cataract, unspecified   . Shortness of breath   . Stroke [facility])    small "stroke" - right sided weakness for a day.  . Tachycardia, unspecified   . Type II or unspecified type diabetes mellitus with renal manifestations, not stated as uncontrolled(250.40)    "prediabetes"-no meds.  . Undiagnosed cardiac murmurs   . Unspecified essential hypertension   . Unspecified vitamin D deficiency     Past Surgical History:  Procedure Laterality Date  . CATARACT EXTRACTION, BILATERAL Bilateral   . EYE SURGERY     cataract  . TOE AMPUTATION  2002  . TONSILLECTOMY  1944  . TONSILLECTOMY     younger yrs  . TOTAL KNEE ARTHROPLASTY Left 01/15/2015   Procedure: LEFT TOTAL KNEE ARTHROPLASTY;  Surgeon: Eugenia Mcalpine, MD;  Location: [facility];  Service: Orthopedics;  Laterality: Left;  . TUBAL LIGATION  1951    Allergies  Allergen Reactions  . Ace Inhibitors Cough  . Lisinopril Cough  . Simvastatin Other (See Comments)    Body aches and pains    Outpatient Encounter Medications  as of 07/02/2017  Medication Sig  . b complex vitamins tablet Take 1 tablet by mouth daily.  . carvedilol (COREG) 12.5 MG tablet TAKE ONE TABLET BY MOUTH TWICE DAILY WITH MEALS FOR BLOOD PRESSURE  . clopidogrel (PLAVIX) 75 MG tablet Take 1 tablet (75 mg total) by mouth daily with breakfast.  . Ergocalciferol (VITAMIN D2) 400 UNITS TABS Take 1 tablet by mouth daily.   . fluticasone (FLONASE) 50 MCG/ACT nasal spray Place 2 sprays into both nostrils daily as needed for allergies or rhinitis.   . hydrochlorothiazide (HYDRODIURIL) 25 MG tablet Take 25 mg by mouth. Take one tablet daily as needed for edema  . mirtazapine (REMERON) 7.5 MG  tablet Take 1 tablet (7.5 mg total) by mouth at bedtime.  . Multiple Vitamins-Minerals (PRESERVISION AREDS 2+MULTI VIT PO) Take 2 capsules by mouth daily.  . traMADol (ULTRAM) 50 MG tablet Take 1 tablet (50 mg total) by mouth 2 (two) times daily as needed.   No facility-administered encounter medications on file as of 07/02/2017.     Review of Systems:  Review of Systems  Constitutional: Negative for chills, fever and malaise/fatigue.  Eyes:       [place] every 3 months  Groat every 2 years  Respiratory: Positive for shortness of breath. Negative for cough.   Cardiovascular: Negative for chest pain, palpitations and leg swelling.  Gastrointestinal: Negative.   Genitourinary: Negative.   Musculoskeletal: Negative for falls.       Uses a cane   Neurological: Negative for dizziness and headaches.  Psychiatric/Behavioral: Negative for depression and memory loss. The patient has insomnia. The patient is not nervous/anxious.     Health Maintenance  Topic Date Due  . OPHTHALMOLOGY EXAM  11/09/2015  . URINE MICROALBUMIN  07/04/2016  . HEMOGLOBIN A1C  10/15/2016  . FOOT EXAM  04/27/2017  . INFLUENZA VACCINE  10/18/2017  . TETANUS/TDAP  05/21/2021  . PNA vac Low Risk Adult  Completed  . DEXA SCAN  Addressed    Physical Exam: Vitals:   07/02/17 1411  BP: (!) 168/84  Pulse: 71  Temp: 97.6 F (36.4 C)  TempSrc: Oral  SpO2: 96%  Weight: 156 lb (70.8 kg)  Height: 5\' 7"  (1.702 m)   Body mass index is 24.43 kg/m. Physical Exam  Constitutional: She is oriented to person, place, and time. She appears well-developed and well-nourished.  HENT:  Head: Normocephalic.  Neck: Normal range of motion. Neck supple. No thyromegaly present.  Cardiovascular: Normal rate, regular rhythm and intact distal pulses.  Murmur heard. Pulmonary/Chest: Effort normal and breath sounds normal.  Abdominal: Soft. Bowel sounds are normal.  Musculoskeletal: Normal range of motion.  Lymphadenopathy:     She has no cervical adenopathy.  Neurological: She is alert and oriented to person, place, and time.  Skin: Skin is warm and dry. Capillary refill takes less than 2 seconds.     Actinic keratosis of left cheek   Psychiatric: She has a normal mood and affect. Her behavior is normal. Judgment and thought content normal.  Vitals reviewed.   Labs reviewed: Basic Metabolic Panel: Recent Labs    10/24/16 0920  NA 142  K 4.5  CL 107  CO2 23  GLUCOSE 111*  BUN 26*  CREATININE 1.03*  CALCIUM 9.0   Liver Function Tests: Recent Labs    10/24/16 0920  AST 11  ALT 8  ALKPHOS 63  BILITOT 0.8  PROT 6.3  ALBUMIN 4.0   No results for input(s): LIPASE, AMYLASE  in the last 8760 hours. No results for input(s): AMMONIA in the last 8760 hours. CBC: Recent Labs    10/24/16 0920 03/01/17 1536  WBC 6.5 6.6  NEUTROABS 4,680 4,732  HGB 12.8 12.6  HCT 39.7 38.0  MCV 91.9 86.2  PLT 181 214   Lipid Panel: No results for input(s): CHOL, HDL, LDLCALC, TRIG, CHOLHDL, LDLDIRECT in the last 8760 hours. Lab Results  Component Value Date   HGBA1C 5.6 04/17/2016    Procedures since last visit: No results found.  Assessment/Plan  1. Dysuria She continues to have pain with voiding, but overall she reports some important. Her previous UA was positive for multiple bacteria. She was sit up with urology, but on second examination, it might be more beneficial for her to see a GYN to assess the skin changes previously seen. As the closes Urogyn is [place] and she is not driving that far away.   2. Lichen sclerosus of female genitalia Still concerned in thoughts of the skin changes. See 1, will refer to GYN and cancel Urologist appt at this time.  3. Hypertension, renal disease, stage 1-4 or unspecified chronic kidney disease BP at home is comparable to this BP reading in the office. She gets dizzy and feels bad when it is lower than 130/70. Will continue her current treatments at this time.    4. Actinic keratosis of left cheek Starting of the actinic changes. Declines derm referral for removal. Will continue to monitor at this time.    Labs/tests ordered:   No orders of the defined types were placed in this encounter.   Next appt:  Visit date not found   Rosine Door, RN, DNP Student  Geriatrics [facility] [phone], [place] [address] Cell Phone (Mon-Fri 8am-5pm):  [phone] On Call:  [phone] & follow prompts after 5pm & weekends Office Phone:  [phone] Office Fax:  [phone]

## 2017-07-02 NOTE — Patient Instructions (Signed)
We'll have you see a gynecologist rather than the urologist because I think your burning is coming from the irritation of your labia and the skin changes there that I saw last time.

## 2017-07-02 NOTE — Progress Notes (Addendum)
Subjective:   Lisa [name] is a 84 y.o. female who presents for Medicare Annual (Subsequent) preventive examination.  Last AWV-04/17/2016    Objective:     Vitals: BP (!) 168/84 (BP Location: Left Arm, Patient Position: Sitting)   Pulse 71   Temp 97.6 F (36.4 C) (Oral)   Ht 5\' 7"  (1.702 m)   Wt 156 lb (70.8 kg)   SpO2 96%   BMI 24.43 kg/m   Body mass index is 24.43 kg/m.  Advanced Directives 07/02/2017 06/14/2017 10/30/2016 07/24/2016 04/27/2016 04/17/2016 11/04/2015  Does Patient Have a Medical Advance Directive? Yes Yes Yes Yes Yes Yes Yes  Type of Estate agent of [place];Living will;Out of facility DNR (pink MOST or yellow form) Healthcare Power of [place];Living will;Out of facility DNR (pink MOST or yellow form) Out of facility DNR (pink MOST or yellow form);Living will Out of facility DNR (pink MOST or yellow form);Living will Healthcare Power of [place];Living will;Out of facility DNR (pink MOST or yellow form) Healthcare Power of Attorney Living will;Out of facility DNR (pink MOST or yellow form)  Does patient want to make changes to medical advance directive? No - Patient declined No - Patient declined - - No - Patient declined - -  Copy of Healthcare Power of Attorney in Chart? Yes Yes - - Yes Yes Yes  Would patient like information on creating a medical advance directive? - - - - - - -  Pre-existing out of facility DNR order (yellow form or pink MOST form) Yellow form placed in chart (order not valid for inpatient use) Yellow form placed in chart (order not valid for inpatient use) Yellow form placed in chart (order not valid for inpatient use) Yellow form placed in chart (order not valid for inpatient use) Yellow form placed in chart (order not valid for inpatient use) - -    Tobacco Social History   Tobacco Use  Smoking Status Never Smoker  Smokeless Tobacco Never Used     Counseling given: Not Answered   Clinical Intake:  Pre-visit  preparation completed: No  Pain : No/denies pain     Nutritional Risks: None Diabetes: No  How often do you need to have someone help you when you read instructions, pamphlets, or other written materials from your doctor or pharmacy?: 1 - Never What is the last grade level you completed in school?: HS  Interpreter Needed?: No  Information entered by :: Tyron Russell, RN  Past Medical History:  Diagnosis Date  . Cervicalgia   . Chronic kidney disease, stage III (moderate) (HCC)   . Dermatophytosis of groin and perianal area   . Hyperlipidemia LDL goal < 100   . Insomnia, unspecified   . Muscle weakness (generalized)   . Osteoarthrosis, unspecified whether generalized or localized, lower leg    osteoarthritis - upper thoracic area, left knee.  . Other abnormal glucose   . Other B-complex deficiencies   . Other malaise and fatigue   . Palpitations   . Senile cataract, unspecified   . Shortness of breath   . Stroke [facility])    small "stroke" - right sided weakness for a day.  . Tachycardia, unspecified   . Type II or unspecified type diabetes mellitus with renal manifestations, not stated as uncontrolled(250.40)    "prediabetes"-no meds.  . Undiagnosed cardiac murmurs   . Unspecified essential hypertension   . Unspecified vitamin D deficiency    Past Surgical History:  Procedure Laterality Date  . CATARACT EXTRACTION,  BILATERAL Bilateral   . EYE SURGERY     cataract  . TOE AMPUTATION  2002  . TONSILLECTOMY  1944  . TONSILLECTOMY     younger yrs  . TOTAL KNEE ARTHROPLASTY Left 01/15/2015   Procedure: LEFT TOTAL KNEE ARTHROPLASTY;  Surgeon: Eugenia Mcalpine, MD;  Location: [facility];  Service: Orthopedics;  Laterality: Left;  . TUBAL LIGATION  1951   Family History  Problem Relation Age of Onset  . Cerebral aneurysm Mother   . Heart attack Father   . Alzheimer's disease Sister   . Emphysema Sister   . Stroke Sister   . Alzheimer's disease Sister   . Arthritis Sister      Social History   Socioeconomic History  . Marital status: Widowed    Spouse name: Not on file  . Number of children: 4  . Years of education: 12th  . Highest education level: Not on file  Occupational History  . Occupation: retired  Engineer, production  . Financial resource strain: Not hard at all  . Food insecurity:    Worry: Never true    Inability: Never true  . Transportation needs:    Medical: No    Non-medical: No  Tobacco Use  . Smoking status: Never Smoker  . Smokeless tobacco: Never Used  Substance and Sexual Activity  . Alcohol use: Yes    Comment: rare social every 3 or 4 months wine  . Drug use: No  . Sexual activity: Never  Lifestyle  . Physical activity:    Days per week: 6 days    Minutes per session: 10 min  . Stress: Not at all  Relationships  . Social connections:    Talks on phone: More than three times a week    Gets together: More than three times a week    Attends religious service: More than 4 times per year    Active member of club or organization: No    Attends meetings of clubs or organizations: Never    Relationship status: Widowed  Other Topics Concern  . Not on file  Social History Narrative   Patient lives at home alone   Patient drinks coffee  And coke     Outpatient Encounter Medications as of 07/02/2017  Medication Sig  . b complex vitamins tablet Take 1 tablet by mouth daily.  . carvedilol (COREG) 12.5 MG tablet TAKE ONE TABLET BY MOUTH TWICE DAILY WITH MEALS FOR BLOOD PRESSURE  . clopidogrel (PLAVIX) 75 MG tablet Take 1 tablet (75 mg total) by mouth daily with breakfast.  . Ergocalciferol (VITAMIN D2) 400 UNITS TABS Take 1 tablet by mouth daily.   . fluticasone (FLONASE) 50 MCG/ACT nasal spray Place 2 sprays into both nostrils daily as needed for allergies or rhinitis.   . hydrochlorothiazide (HYDRODIURIL) 25 MG tablet Take 25 mg by mouth. Take one tablet daily as needed for edema  . mirtazapine (REMERON) 7.5 MG tablet Take 1 tablet  (7.5 mg total) by mouth at bedtime.  . Multiple Vitamins-Minerals (PRESERVISION AREDS 2+MULTI VIT PO) Take 2 capsules by mouth daily.  . traMADol (ULTRAM) 50 MG tablet Take 1 tablet (50 mg total) by mouth 2 (two) times daily as needed.  . [DISCONTINUED] amoxicillin-clavulanate (AUGMENTIN) 875-125 MG tablet Take 1 tablet by mouth 2 (two) times daily.   No facility-administered encounter medications on file as of 07/02/2017.     Activities of Daily Living In your present state of health, do you have any difficulty performing the  following activities: 07/02/2017  Hearing? N  Vision? N  Difficulty concentrating or making decisions? Y  Walking or climbing stairs? Y  Dressing or bathing? N  Doing errands, shopping? N  Preparing Food and eating ? N  Using the Toilet? N  In the past six months, have you accidently leaked urine? Y  Do you have problems with loss of bowel control? N  Managing your Medications? N  Managing your Finances? N  Housekeeping or managing your Housekeeping? N  Some recent data might be hidden    Patient Care Team: Kermit Baloeed, Tiffany L, DO as PCP - General (Geriatric Medicine) Eugenia Mcalpineollins, Robert, MD as Consulting Physician (Orthopedic Surgery) Marvel PlanXu, Jindong, MD as Consulting Physician (Neurology)    Assessment:   This is a routine wellness examination for Nicholos JohnsKathleen.  Exercise Activities and Dietary recommendations Current Exercise Habits: The patient does not participate in regular exercise at present, Exercise limited by: None identified  Goals    None      Fall Risk Fall Risk  07/02/2017 06/12/2017 03/01/2017 10/30/2016 07/24/2016  Falls in the past year? No No No No No  Number falls in past yr: - - - - -  Risk for fall due to : - - - - -   Is the patient's home free of loose throw rugs in walkways, pet beds, electrical cords, etc?   yes      Grab bars in the bathroom? yes      Handrails on the stairs?   yes      Adequate lighting?   yes  Depression Screen PHQ 2/9  Scores 07/02/2017 03/01/2017 10/30/2016 07/24/2016  PHQ - 2 Score 0 0 0 0     Cognitive Function MMSE - Mini Mental State Exam 07/02/2017 04/17/2016 04/12/2015  Not completed: - - (No Data)  Orientation to time 5 5 5   Orientation to Place 5 5 5   Registration 3 3 3   Attention/ Calculation 4 4 5   Recall 1 3 2   Language- name 2 objects 2 2 2   Language- repeat 1 1 1   Language- follow 3 step command 3 3 3   Language- read & follow direction 1 1 1   Write a sentence 1 1 1   Copy design 1 0 0  Total score 27 28 28         Immunization History  Administered Date(s) Administered  . Influenza Split 11/18/2008, 12/28/2009  . Influenza Whole 12/24/2012  . Influenza,inj,Quad PF,6+ Mos 12/21/2016  . Influenza-Unspecified 01/13/2014, 12/19/2014, 01/15/2016  . PPD Test 01/18/2015  . Pneumococcal Conjugate-13 01/12/2014  . Pneumococcal Polysaccharide-23 05/22/2011  . Tdap 05/22/2011  . Zoster 12/20/2010    Qualifies for Shingles Vaccine? Yes, educated and declined  Screening Tests Health Maintenance  Topic Date Due  . OPHTHALMOLOGY EXAM  11/09/2015  . URINE MICROALBUMIN  07/04/2016  . HEMOGLOBIN A1C  10/15/2016  . FOOT EXAM  04/27/2017  . INFLUENZA VACCINE  10/18/2017  . TETANUS/TDAP  05/21/2021  . PNA vac Low Risk Adult  Completed  . DEXA SCAN  Addressed    Cancer Screenings: Lung: Low Dose CT Chest recommended if Age 54-80 years, 30 pack-year currently smoking OR have quit w/in 15years. Patient does not qualify. Breast:  Up to date on Mammogram? Yes   Up to date of Bone Density/Dexa? Yes Colorectal: up to date  Additional Screenings:  Hepatitis C Screening: declined Last eye exam March 2019 per patient     Plan:    I have personally reviewed and addressed the  Medicare Annual Wellness questionnaire and have noted the following in the patient's chart:  A. Medical and social history B. Use of alcohol, tobacco or illicit drugs  C. Current medications and supplements D. Functional  ability and status E.  Nutritional status F.  Physical activity G. Advance directives H. List of other physicians I.  Hospitalizations, surgeries, and ER visits in previous 12 months J.  Vitals K. Screenings to include hearing, vision, cognitive, depression L. Referrals and appointments - none  In addition, I have reviewed and discussed with patient certain preventive protocols, quality metrics, and best practice recommendations. A written personalized care plan for preventive services as well as general preventive health recommendations were provided to patient.  See attached scanned questionnaire for additional information.   Signed,   Tyron Russell, RN Nurse Health Advisor  Patient Concerns: L spot on face that she would like to be checked out

## 2017-07-02 NOTE — Patient Instructions (Signed)
Lisa [name] , Thank you for taking time to come for your Medicare Wellness Visit. I appreciate your ongoing commitment to your health goals. Please review the following plan we discussed and let me know if I can assist you in the future.   Screening recommendations/referrals: Colonoscopy excluded, you are over age 82 Mammogram excluded, you are over age 82 Bone Density up to date Recommended yearly ophthalmology/optometry visit for glaucoma screening and checkup Recommended yearly dental visit for hygiene and checkup  Vaccinations: Influenza vaccine up to date, due 2019 fall season Pneumococcal vaccine up to date,completed Tdap vaccine up to date, due 05/21/2021 Shingles vaccine due, declined    Advanced directives: in chart  Conditions/risks identified: none  Next appointment: Tyron RussellSara Scarlet Abad, RN 07/05/2018 @ 9:15am   Preventive Care 65 Years and Older, Female Preventive care refers to lifestyle choices and visits with your health care provider that can promote health and wellness. What does preventive care include?  A yearly physical exam. This is also called an annual well check.  Dental exams once or twice a year.  Routine eye exams. Ask your health care provider how often you should have your eyes checked.  Personal lifestyle choices, including:  Daily care of your teeth and gums.  Regular physical activity.  Eating a healthy diet.  Avoiding tobacco and drug use.  Limiting alcohol use.  Practicing safe sex.  Taking low-dose aspirin every day.  Taking vitamin and mineral supplements as recommended by your health care provider. What happens during an annual well check? The services and screenings done by your health care provider during your annual well check will depend on your age, overall health, lifestyle risk factors, and family history of disease. Counseling  Your health care provider may ask you questions about your:  Alcohol use.  Tobacco use.  Drug  use.  Emotional well-being.  Home and relationship well-being.  Sexual activity.  Eating habits.  History of falls.  Memory and ability to understand (cognition).  Work and work Astronomerenvironment.  Reproductive health. Screening  You may have the following tests or measurements:  Height, weight, and BMI.  Blood pressure.  Lipid and cholesterol levels. These may be checked every 5 years, or more frequently if you are over 82 years old.  Skin check.  Lung cancer screening. You may have this screening every year starting at age 82 if you have a 30-pack-year history of smoking and currently smoke or have quit within the past 15 years.  Fecal occult blood test (FOBT) of the stool. You may have this test every year starting at age 82.  Flexible sigmoidoscopy or colonoscopy. You may have a sigmoidoscopy every 5 years or a colonoscopy every 10 years starting at age 82.  Hepatitis C blood test.  Hepatitis B blood test.  Sexually transmitted disease (STD) testing.  Diabetes screening. This is done by checking your blood sugar (glucose) after you have not eaten for a while (fasting). You may have this done every 1-3 years.  Bone density scan. This is done to screen for osteoporosis. You may have this done starting at age 82.  Mammogram. This may be done every 1-2 years. Talk to your health care provider about how often you should have regular mammograms. Talk with your health care provider about your test results, treatment options, and if necessary, the need for more tests. Vaccines  Your health care provider may recommend certain vaccines, such as:  Influenza vaccine. This is recommended every year.  Tetanus, diphtheria, and  acellular pertussis (Tdap, Td) vaccine. You may need a Td booster every 10 years.  Zoster vaccine. You may need this after age 58.  Pneumococcal 13-valent conjugate (PCV13) vaccine. One dose is recommended after age 59.  Pneumococcal polysaccharide  (PPSV23) vaccine. One dose is recommended after age 13. Talk to your health care provider about which screenings and vaccines you need and how often you need them. This information is not intended to replace advice given to you by your health care provider. Make sure you discuss any questions you have with your health care provider. Document Released: 04/02/2015 Document Revised: 11/24/2015 Document Reviewed: 01/05/2015 Elsevier Interactive Patient Education  2017 [place] Prevention in the Home Falls can cause injuries. They can happen to people of all ages. There are many things you can do to make your home safe and to help prevent falls. What can I do on the outside of my home?  Regularly fix the edges of walkways and driveways and fix any cracks.  Remove anything that might make you trip as you walk through a door, such as a raised step or threshold.  Trim any bushes or trees on the path to your home.  Use bright outdoor lighting.  Clear any walking paths of anything that might make someone trip, such as rocks or tools.  Regularly check to see if handrails are loose or broken. Make sure that both sides of any steps have handrails.  Any raised decks and porches should have guardrails on the edges.  Have any leaves, snow, or ice cleared regularly.  Use sand or salt on walking paths during winter.  Clean up any spills in your garage right away. This includes oil or grease spills. What can I do in the bathroom?  Use night lights.  Install grab bars by the toilet and in the tub and shower. Do not use towel bars as grab bars.  Use non-skid mats or decals in the tub or shower.  If you need to sit down in the shower, use a plastic, non-slip stool.  Keep the floor dry. Clean up any water that spills on the floor as soon as it happens.  Remove soap buildup in the tub or shower regularly.  Attach bath mats securely with double-sided non-slip rug tape.  Do not have  throw rugs and other things on the floor that can make you trip. What can I do in the bedroom?  Use night lights.  Make sure that you have a light by your bed that is easy to reach.  Do not use any sheets or blankets that are too big for your bed. They should not hang down onto the floor.  Have a firm chair that has side arms. You can use this for support while you get dressed.  Do not have throw rugs and other things on the floor that can make you trip. What can I do in the kitchen?  Clean up any spills right away.  Avoid walking on wet floors.  Keep items that you use a lot in easy-to-reach places.  If you need to reach something above you, use a strong step stool that has a grab bar.  Keep electrical cords out of the way.  Do not use floor polish or wax that makes floors slippery. If you must use wax, use non-skid floor wax.  Do not have throw rugs and other things on the floor that can make you trip. What can I do with my stairs?  Do not leave any items on the stairs.  Make sure that there are handrails on both sides of the stairs and use them. Fix handrails that are broken or loose. Make sure that handrails are as long as the stairways.  Check any carpeting to make sure that it is firmly attached to the stairs. Fix any carpet that is loose or worn.  Avoid having throw rugs at the top or bottom of the stairs. If you do have throw rugs, attach them to the floor with carpet tape.  Make sure that you have a light switch at the top of the stairs and the bottom of the stairs. If you do not have them, ask someone to add them for you. What else can I do to help prevent falls?  Wear shoes that:  Do not have high heels.  Have rubber bottoms.  Are comfortable and fit you well.  Are closed at the toe. Do not wear sandals.  If you use a stepladder:  Make sure that it is fully opened. Do not climb a closed stepladder.  Make sure that both sides of the stepladder are  locked into place.  Ask someone to hold it for you, if possible.  Clearly mark and make sure that you can see:  Any grab bars or handrails.  First and last steps.  Where the edge of each step is.  Use tools that help you move around (mobility aids) if they are needed. These include:  Canes.  Walkers.  Scooters.  Crutches.  Turn on the lights when you go into a dark area. Replace any light bulbs as soon as they burn out.  Set up your furniture so you have a clear path. Avoid moving your furniture around.  If any of your floors are uneven, fix them.  If there are any pets around you, be aware of where they are.  Review your medicines with your doctor. Some medicines can make you feel dizzy. This can increase your chance of falling. Ask your doctor what other things that you can do to help prevent falls. This information is not intended to replace advice given to you by your health care provider. Make sure you discuss any questions you have with your health care provider. Document Released: 12/31/2008 Document Revised: 08/12/2015 Document Reviewed: 04/10/2014 Elsevier Interactive Patient Education  2017 [employer].

## 2017-07-03 ENCOUNTER — Other Ambulatory Visit: Payer: Self-pay | Admitting: *Deleted

## 2017-07-03 DIAGNOSIS — M1711 Unilateral primary osteoarthritis, right knee: Secondary | ICD-10-CM

## 2017-07-03 MED ORDER — TRAMADOL HCL 50 MG PO TABS: 50.0000 mg | ORAL_TABLET | Freq: Two times a day (BID) | ORAL | 3 refills | Status: DC | PRN

## 2017-07-03 NOTE — Telephone Encounter (Signed)
Patient called and stated that now she uses [facility] and needs Rx sent to them. Would like 90 day supply is this ok.  Pended Rx and sent to Dr. Renato Gailseed to approve

## 2017-07-04 ENCOUNTER — Other Ambulatory Visit: Payer: Self-pay | Admitting: *Deleted

## 2017-07-04 DIAGNOSIS — I129 Hypertensive chronic kidney disease with stage 1 through stage 4 chronic kidney disease, or unspecified chronic kidney disease: Secondary | ICD-10-CM

## 2017-07-04 DIAGNOSIS — G4700 Insomnia, unspecified: Secondary | ICD-10-CM

## 2017-07-04 DIAGNOSIS — E782 Mixed hyperlipidemia: Secondary | ICD-10-CM

## 2017-07-04 MED ORDER — MIRTAZAPINE 7.5 MG PO TABS: 7.5000 mg | ORAL_TABLET | Freq: Every day | ORAL | 1 refills | Status: DC

## 2017-07-04 MED ORDER — TRAMADOL HCL 50 MG PO TABS: 50.0000 mg | ORAL_TABLET | Freq: Two times a day (BID) | ORAL | 3 refills | Status: DC | PRN

## 2017-07-04 MED ORDER — CARVEDILOL 12.5 MG PO TABS: ORAL_TABLET | ORAL | 1 refills | Status: DC

## 2017-07-04 MED ORDER — CLOPIDOGREL BISULFATE 75 MG PO TABS: 75.0000 mg | ORAL_TABLET | Freq: Every day | ORAL | 1 refills | Status: DC

## 2017-07-04 NOTE — Addendum Note (Signed)
Addended by: Nelda SevereMAY, ANITA A on: 07/04/2017 09:16 AM   Modules accepted: Orders

## 2017-07-04 NOTE — Telephone Encounter (Signed)
[facility] 

## 2017-07-04 NOTE — Telephone Encounter (Addendum)
Received fax from [facility] that stated to support safe use of opioid medications, [facility] will no longer dispense more than a 30 day supply at one time.   Rx reduced from #180 to #60

## 2017-07-17 ENCOUNTER — Encounter (HOSPITAL_COMMUNITY): Payer: Self-pay | Admitting: Neurology

## 2017-07-17 ENCOUNTER — Observation Stay (HOSPITAL_COMMUNITY)
Admission: EM | Admit: 2017-07-17 | Discharge: 2017-07-18 | Disposition: A | Discharge status: Home / Self Care | Payer: Medicare Other | Attending: Internal Medicine | Admitting: Internal Medicine

## 2017-07-17 ENCOUNTER — Observation Stay (HOSPITAL_COMMUNITY): Payer: Medicare Other

## 2017-07-17 DIAGNOSIS — I4510 Unspecified right bundle-branch block: Secondary | ICD-10-CM | POA: Diagnosis not present

## 2017-07-17 DIAGNOSIS — Z961 Presence of intraocular lens: Secondary | ICD-10-CM | POA: Diagnosis not present

## 2017-07-17 DIAGNOSIS — Z7902 Long term (current) use of antithrombotics/antiplatelets: Secondary | ICD-10-CM | POA: Diagnosis not present

## 2017-07-17 DIAGNOSIS — Z89429 Acquired absence of other toe(s), unspecified side: Secondary | ICD-10-CM | POA: Diagnosis not present

## 2017-07-17 DIAGNOSIS — I129 Hypertensive chronic kidney disease with stage 1 through stage 4 chronic kidney disease, or unspecified chronic kidney disease: Secondary | ICD-10-CM

## 2017-07-17 DIAGNOSIS — Z96652 Presence of left artificial knee joint: Secondary | ICD-10-CM | POA: Diagnosis not present

## 2017-07-17 DIAGNOSIS — I255 Ischemic cardiomyopathy: Secondary | ICD-10-CM | POA: Diagnosis not present

## 2017-07-17 DIAGNOSIS — R55 Syncope and collapse: Secondary | ICD-10-CM | POA: Diagnosis not present

## 2017-07-17 DIAGNOSIS — N183 Chronic kidney disease, stage 3 unspecified: Secondary | ICD-10-CM | POA: Diagnosis present

## 2017-07-17 DIAGNOSIS — Z9841 Cataract extraction status, right eye: Secondary | ICD-10-CM | POA: Insufficient documentation

## 2017-07-17 DIAGNOSIS — R002 Palpitations: Secondary | ICD-10-CM | POA: Insufficient documentation

## 2017-07-17 DIAGNOSIS — Z79899 Other long term (current) drug therapy: Secondary | ICD-10-CM | POA: Diagnosis not present

## 2017-07-17 DIAGNOSIS — Z8673 Personal history of transient ischemic attack (TIA), and cerebral infarction without residual deficits: Secondary | ICD-10-CM | POA: Insufficient documentation

## 2017-07-17 DIAGNOSIS — E559 Vitamin D deficiency, unspecified: Secondary | ICD-10-CM | POA: Diagnosis not present

## 2017-07-17 DIAGNOSIS — I6523 Occlusion and stenosis of bilateral carotid arteries: Secondary | ICD-10-CM | POA: Insufficient documentation

## 2017-07-17 DIAGNOSIS — Z7951 Long term (current) use of inhaled steroids: Secondary | ICD-10-CM | POA: Insufficient documentation

## 2017-07-17 DIAGNOSIS — E1122 Type 2 diabetes mellitus with diabetic chronic kidney disease: Secondary | ICD-10-CM | POA: Insufficient documentation

## 2017-07-17 DIAGNOSIS — R031 Nonspecific low blood-pressure reading: Secondary | ICD-10-CM | POA: Diagnosis not present

## 2017-07-17 DIAGNOSIS — Z8679 Personal history of other diseases of the circulatory system: Secondary | ICD-10-CM | POA: Diagnosis not present

## 2017-07-17 DIAGNOSIS — Z888 Allergy status to other drugs, medicaments and biological substances status: Secondary | ICD-10-CM | POA: Insufficient documentation

## 2017-07-17 DIAGNOSIS — Z9842 Cataract extraction status, left eye: Secondary | ICD-10-CM | POA: Insufficient documentation

## 2017-07-17 DIAGNOSIS — E785 Hyperlipidemia, unspecified: Secondary | ICD-10-CM | POA: Diagnosis not present

## 2017-07-17 DIAGNOSIS — D649 Anemia, unspecified: Secondary | ICD-10-CM | POA: Insufficient documentation

## 2017-07-17 LAB — CBC
HCT: 37.5 % (ref 36.0–46.0)
HEMATOCRIT: 36 % (ref 36.0–46.0)
HEMOGLOBIN: 11.5 g/dL — AB (ref 12.0–15.0)
Hemoglobin: 11.9 g/dL — ABNORMAL LOW (ref 12.0–15.0)
MCH: 29 pg (ref 26.0–34.0)
MCH: 28.7 pg (ref 26.0–34.0)
MCHC: 31.9 g/dL (ref 30.0–36.0)
MCHC: 31.7 g/dL (ref 30.0–36.0)
MCV: 90.7 fL (ref 78.0–100.0)
MCV: 90.4 fL (ref 78.0–100.0)
PLATELETS: 178 10*3/uL (ref 150–400)
Platelets: 175 10*3/uL (ref 150–400)
RBC: 3.97 MIL/uL (ref 3.87–5.11)
RBC: 4.15 MIL/uL (ref 3.87–5.11)
RDW: 14.2 % (ref 11.5–15.5)
RDW: 14.1 % (ref 11.5–15.5)
WBC: 7.3 10*3/uL (ref 4.0–10.5)
WBC: 7.1 10*3/uL (ref 4.0–10.5)

## 2017-07-17 LAB — I-STAT TROPONIN, ED: Troponin i, poc: 0.02 ng/mL (ref 0.00–0.08)

## 2017-07-17 LAB — BASIC METABOLIC PANEL
ANION GAP: 6 (ref 5–15)
BUN: 30 mg/dL — ABNORMAL HIGH (ref 6–20)
CHLORIDE: 109 mmol/L (ref 101–111)
CO2: 24 mmol/L (ref 22–32)
Calcium: 8.5 mg/dL — ABNORMAL LOW (ref 8.9–10.3)
Creatinine, Ser: 1.16 mg/dL — ABNORMAL HIGH (ref 0.44–1.00)
GFR calc non Af Amer: 39 mL/min — ABNORMAL LOW (ref >60)
GFR, EST AFRICAN AMERICAN: 46 mL/min — AB (ref >60)
GLUCOSE: 193 mg/dL — AB (ref 65–99)
Potassium: 4.3 mmol/L (ref 3.5–5.1)
Sodium: 139 mmol/L (ref 135–145)

## 2017-07-17 LAB — CBG MONITORING, ED: Glucose-Capillary: 179 mg/dL — ABNORMAL HIGH (ref 65–99)

## 2017-07-17 LAB — CREATININE, SERUM
CREATININE: 0.92 mg/dL (ref 0.44–1.00)
GFR, EST NON AFRICAN AMERICAN: 52 mL/min — AB (ref >60)

## 2017-07-17 LAB — TSH: TSH: 1.486 u[IU]/mL (ref 0.350–4.500)

## 2017-07-17 LAB — MAGNESIUM: MAGNESIUM: 2.1 mg/dL (ref 1.7–2.4)

## 2017-07-17 MED ORDER — FLUTICASONE PROPIONATE 50 MCG/ACT NA SUSP
2.0000 | Freq: Every day | NASAL | Status: DC | PRN
  Filled 2017-07-17: qty 16

## 2017-07-17 MED ORDER — HYDRALAZINE HCL 20 MG/ML IJ SOLN
5.0000 mg | Freq: Four times a day (QID) | INTRAMUSCULAR | Status: DC | PRN
  Administered 2017-07-17: 5 mg via INTRAVENOUS
  Filled 2017-07-17: qty 1

## 2017-07-17 MED ORDER — B COMPLEX-C PO TABS
1.0000 | ORAL_TABLET | Freq: Every day | ORAL | Status: DC
  Administered 2017-07-18: 1 via ORAL
  Filled 2017-07-17 (×3): qty 1

## 2017-07-17 MED ORDER — HYDROCHLOROTHIAZIDE 25 MG PO TABS
25.0000 mg | ORAL_TABLET | Freq: Every day | ORAL | Status: DC
  Administered 2017-07-17: 25 mg via ORAL
  Filled 2017-07-17: qty 1

## 2017-07-17 MED ORDER — SODIUM CHLORIDE 0.9 % IV BOLUS
1000.0000 mL | Freq: Once | INTRAVENOUS | Status: AC
  Administered 2017-07-17: 1000 mL via INTRAVENOUS

## 2017-07-17 MED ORDER — CARVEDILOL 6.25 MG PO TABS
6.2500 mg | ORAL_TABLET | Freq: Two times a day (BID) | ORAL | Status: DC
  Administered 2017-07-17 – 2017-07-18 (×3): 6.25 mg via ORAL
  Filled 2017-07-17 (×4): qty 1

## 2017-07-17 MED ORDER — CHOLECALCIFEROL 10 MCG (400 UNIT) PO TABS
400.0000 [IU] | ORAL_TABLET | Freq: Every day | ORAL | Status: DC
  Administered 2017-07-18: 400 [IU] via ORAL
  Filled 2017-07-17: qty 1

## 2017-07-17 MED ORDER — MIRTAZAPINE 15 MG PO TABS
7.5000 mg | ORAL_TABLET | Freq: Every day | ORAL | Status: DC
  Filled 2017-07-17 (×2): qty 1

## 2017-07-17 MED ORDER — SODIUM CHLORIDE 0.9% FLUSH
3.0000 mL | Freq: Two times a day (BID) | INTRAVENOUS | Status: DC
  Administered 2017-07-17 – 2017-07-18 (×2): 3 mL via INTRAVENOUS

## 2017-07-17 MED ORDER — ENOXAPARIN SODIUM 40 MG/0.4ML ~~LOC~~ SOLN
40.0000 mg | SUBCUTANEOUS | Status: DC
  Filled 2017-07-17 (×2): qty 0.4

## 2017-07-17 MED ORDER — PROSIGHT PO TABS
ORAL_TABLET | Freq: Every day | ORAL | Status: DC
  Administered 2017-07-18: 1 via ORAL
  Filled 2017-07-17 (×2): qty 1

## 2017-07-17 MED ORDER — CLOPIDOGREL BISULFATE 75 MG PO TABS
75.0000 mg | ORAL_TABLET | Freq: Every day | ORAL | Status: DC
  Administered 2017-07-18: 75 mg via ORAL
  Filled 2017-07-17: qty 1

## 2017-07-17 NOTE — ED Provider Notes (Signed)
[facility] EMERGENCY DEPARTMENT Provider Note   CSN: [id number] Arrival date & time: 07/17/17  1320     History   Chief Complaint Chief Complaint  Patient presents with  . Loss of Consciousness    HPI Lisa [name] is a 84 y.o. female.  84 yo F with a chief complaint of a syncopal event.  The patient was eating lunch and then passed out in her chair.  There was a retired paramedic nearby who came over and felt that she did not have pulses and was not breathing and so they laid her back flat and she spontaneously started breathing again.  Pulses were palpated.  The patient denied chest pain or shortness of breath.  She is amnestic to the events where she was passed out but no prolonged amnesia.  Denies any injury from the loss of consciousness.  She denies recent illness.  Denies dark stool or blood in her stool.  The history is provided by the patient.  Loss of Consciousness   This is a new problem. The current episode started less than 1 hour ago. The problem occurs constantly. The problem has been resolved. She lost consciousness for a period of 1 to 5 minutes. Associated with: eating. Pertinent negatives include chest pain, congestion, dizziness, fever, headaches, nausea, palpitations and vomiting. She has tried nothing for the symptoms. The treatment provided no relief.    Past Medical History:  Diagnosis Date  . Cervicalgia   . Chronic kidney disease, stage III (moderate) (HCC)   . Dermatophytosis of groin and perianal area   . Hyperlipidemia LDL goal < 100   . Insomnia, unspecified   . Muscle weakness (generalized)   . Osteoarthrosis, unspecified whether generalized or localized, lower leg    osteoarthritis - upper thoracic area, left knee.  . Other abnormal glucose   . Other B-complex deficiencies   . Other malaise and fatigue   . Palpitations   . Senile cataract, unspecified   . Shortness of breath   . Stroke [facility])    small "stroke" - right  sided weakness for a day.  . Tachycardia, unspecified   . Type II or unspecified type diabetes mellitus with renal manifestations, not stated as uncontrolled(250.40)    "prediabetes"-no meds.  . Undiagnosed cardiac murmurs   . Unspecified essential hypertension   . Unspecified vitamin D deficiency     Patient Active Problem List   Diagnosis Date Noted  . Syncope 07/17/2017  . Renal hypertension 07/05/2015  . Neck pain, chronic 07/05/2015  . Status post total left knee replacement 07/05/2015  . Controlled type 2 diabetes mellitus with stage 3 chronic kidney disease, without long-term current use of insulin (HCC) 07/05/2015  . Primary osteoarthritis of left knee 01/15/2015  . S/P knee replacement 01/15/2015  . Dry skin 01/28/2014  . HLD (hyperlipidemia) 01/06/2014  . Essential hypertension 01/06/2014  . B12 deficiency 09/29/2013  . Mixed hyperlipidemia 09/29/2013  . Hypertension, renal disease, stage 1-4 or unspecified chronic kidney disease 06/26/2013  . Seasonal allergies 06/26/2013  . CVA (cerebral vascular accident) (HCC) 04/09/2013  . Stroke (HCC) 04/09/2013  . Insomnia 09/14/2012  . Hyperlipidemia   . Osteoarthritis of right knee   . Undiagnosed cardiac murmurs   . Shortness of breath   . Type 2 diabetes mellitus with renal manifestations, controlled (HCC)   . Chronic kidney disease, stage III (moderate) (HCC)   . Cervicalgia   . HYPERTENSION 11/30/2008  . Dyspnea 11/30/2008  . History of  cardiovascular disorder 11/30/2008    Past Surgical History:  Procedure Laterality Date  . CATARACT EXTRACTION, BILATERAL Bilateral   . EYE SURGERY     cataract  . TOE AMPUTATION  2002  . TONSILLECTOMY  1944  . TONSILLECTOMY     younger yrs  . TOTAL KNEE ARTHROPLASTY Left 01/15/2015   Procedure: LEFT TOTAL KNEE ARTHROPLASTY;  Surgeon: Eugenia Mcalpine, MD;  Location: [facility];  Service: Orthopedics;  Laterality: Left;  . TUBAL LIGATION  1951     OB History   None      Home  Medications    Prior to Admission medications   Medication Sig Start Date End Date Taking? Authorizing Provider  b complex vitamins tablet Take 1 tablet by mouth daily.   Yes [provider]  carvedilol (COREG) 12.5 MG tablet Take one tablet by mouth twice daily with meals for blood pressure 07/04/17  Yes Reed, Tiffany L, DO  clopidogrel (PLAVIX) 75 MG tablet Take 1 tablet (75 mg total) by mouth daily with breakfast. 07/04/17  Yes Reed, Tiffany L, DO  Ergocalciferol (VITAMIN D2) 400 UNITS TABS Take 1 tablet by mouth daily.    Yes [provider]  fluticasone (FLONASE) 50 MCG/ACT nasal spray Place 2 sprays into both nostrils daily as needed for allergies or rhinitis.    Yes [provider]  hydrochlorothiazide (HYDRODIURIL) 25 MG tablet Take 25 mg by mouth. Take one tablet daily as needed for edema   Yes [provider]  mirtazapine (REMERON) 7.5 MG tablet Take 1 tablet (7.5 mg total) by mouth at bedtime. 07/04/17  Yes Reed, Tiffany L, DO  Multiple Vitamins-Minerals (PRESERVISION AREDS 2+MULTI VIT PO) Take 2 capsules by mouth daily.   Yes [provider]  traMADol (ULTRAM) 50 MG tablet Take 1 tablet (50 mg total) by mouth 2 (two) times daily as needed. 07/04/17  Yes Reed, Tiffany L, DO    Family History Family History  Problem Relation Age of Onset  . Cerebral aneurysm Mother   . Heart attack Father   . Alzheimer's disease Sister   . Emphysema Sister   . Stroke Sister   . Alzheimer's disease Sister   . Arthritis Sister     Social History Social History   Tobacco Use  . Smoking status: Never Smoker  . Smokeless tobacco: Never Used  Substance Use Topics  . Alcohol use: Yes    Comment: rare social every 3 or 4 months wine  . Drug use: No     Allergies   Ace inhibitors; Lisinopril; and Simvastatin   Review of Systems Review of Systems  Constitutional: Negative for chills and fever.  HENT: Negative for congestion and rhinorrhea.     Eyes: Negative for redness and visual disturbance.  Respiratory: Negative for shortness of breath and wheezing.   Cardiovascular: Positive for syncope. Negative for chest pain and palpitations.  Gastrointestinal: Negative for nausea and vomiting.  Genitourinary: Negative for dysuria and urgency.  Musculoskeletal: Negative for arthralgias and myalgias.  Skin: Negative for pallor and wound.  Neurological: Positive for syncope. Negative for dizziness and headaches.     Physical Exam Updated Vital Signs BP (!) 131/57 (BP Location: Right Arm)   Pulse 61   Temp 97.9 F (36.6 C) (Oral)   Resp 17   Ht  (1.702 m)   Wt 70.8 kg (156 lb)   SpO2 98%   BMI 24.43 kg/m   Physical Exam  Constitutional: She is oriented to person, place, and  time. She appears well-developed and well-nourished. No distress.  HENT:  Head: Normocephalic and atraumatic.  Eyes: Pupils are equal, round, and reactive to light. EOM are normal.  Neck: Normal range of motion. Neck supple.  Cardiovascular: Normal rate and regular rhythm. Exam reveals no gallop and no friction rub.  No murmur heard. Pulmonary/Chest: Effort normal. She has no wheezes. She has no rales.  Abdominal: Soft. She exhibits no distension and no mass. There is no tenderness. There is no guarding.  Musculoskeletal: She exhibits no edema or tenderness.  Neurological: She is alert and oriented to person, place, and time.  Skin: Skin is warm and dry. She is not diaphoretic.  Psychiatric: She has a normal mood and affect. Her behavior is normal.  Nursing note and vitals reviewed.    ED Treatments / Results  Labs (all labs ordered are listed, but only abnormal results are displayed) Labs Reviewed  BASIC METABOLIC PANEL - Abnormal; Notable for the following components:      Result Value   Glucose, Bld 193 (*)    BUN 30 (*)    Creatinine, Ser 1.16 (*)    Calcium 8.5 (*)    GFR calc non Af Amer 39 (*)    GFR calc Af Amer 46 (*)    All  other components within normal limits  CBC - Abnormal; Notable for the following components:   Hemoglobin 11.5 (*)    All other components within normal limits  CBG MONITORING, ED - Abnormal; Notable for the following components:   Glucose-Capillary 179 (*)    All other components within normal limits  URINALYSIS, ROUTINE W REFLEX MICROSCOPIC  I-STAT TROPONIN, ED    EKG EKG Interpretation  Date/Time:  Tuesday July 17 2017 13:46:10 EDT Ventricular Rate:  74 PR Interval:    QRS Duration: 140 QT Interval:  424 QTC Calculation: 471 R Axis:   33 Text Interpretation:  Sinus rhythm Borderline prolonged PR interval Right bundle branch block Abnrm T, consider ischemia, anterolateral lds flipped t wave in lateral leads seen on prior Otherwise no significant change Confirmed by Melene Plan [phone]) on 07/17/2017 2:19:45 PM   Radiology No results found.  Procedures Procedures (including critical care time)  Medications Ordered in ED Medications  sodium chloride 0.9 % bolus 1,000 mL (1,000 mLs Intravenous New Bag/Given 07/17/17 1443)     Initial Impression / Assessment and Plan / ED Course  I have reviewed the triage vital signs and the nursing notes.  Pertinent labs & imaging results that were available during my care of the patient were reviewed by me and considered in my medical decision making (see chart for details).     84 yo F with a chief complaint of syncope.  Most likely this is vasovagal in nature however her friends who witnessed the event are adamant that the patient completely lost pulses for 1 to 2 minutes.  This would make this story concerning for a primary arrhythmia. Will discuss with hospitalist.   The patients results and plan were reviewed and discussed.   Any x-rays performed were independently reviewed by myself.   Differential diagnosis were considered with the presenting HPI.  Medications  sodium chloride 0.9 % bolus 1,000 mL (1,000 mLs Intravenous New  Bag/Given 07/17/17 1443)    Vitals:   07/17/17 1332 07/17/17 1340  BP:  (!) 131/57  Pulse:  61  Resp:  17  Temp:  97.9 F (36.6 C)  TempSrc:  Oral  SpO2:  98%  Weight:  70.8 kg (156 lb)   Height:  (1.702 m)     Final diagnoses:  Syncope and collapse    Admission/ observation were discussed with the admitting physician, patient and/or family and they are comfortable with the plan.    Final Clinical Impressions(s) / ED Diagnoses   Final diagnoses:  Syncope and collapse    ED Discharge Orders    None       Melene Plan, DO 07/17/17 1653

## 2017-07-17 NOTE — ED Notes (Signed)
Pharmacy to be called to retime some of patient meds. Pt states takes vitamins in the morning when she wakes up

## 2017-07-17 NOTE — H&P (Signed)
History and Physical    Lisa [name] GEX:[id number] DOB: [birth year] DOA: 07/17/2017  Referring MD/NP/PA: Melene Plan  PCP: Kermit Balo, DO   Outpatient Specialists: None   Patient coming from: SNF  Chief Complaint: Passing out  HPI: Lisa [name] is a 84 y.o. female with medical history significant of patient is here from skilled nursing facility where she leaves. She was up early eating lunch today when she suddenly passed out in her chair. Her daughter was there and they retired paramedic was also there. At the time of the episode she was said to have no pulse and not breathing for almost 2 minutes. Patient was placed flat on her back. Before CPR will be started patient came to her sinuses. She was completely back to normal at that point. Patient has no recollection of what happened at the time. Daughter denied any foaming at the mouth. No loss of bladder function. Denied any tonic-clonic contractions.   ED Course: Patient was seen in the ER and evaluated. Her glucose was not 193 with a creatinine of 1.16. EKG showed no new abnormalities. Urinalysis still pending. Head CT was not done.  Review of Systems: As per HPI otherwise 10 point review of systems negative.    Past Medical History:  Diagnosis Date  . Cervicalgia   . Chronic kidney disease, stage III (moderate) (HCC)   . Dermatophytosis of groin and perianal area   . Hyperlipidemia LDL goal < 100   . Insomnia, unspecified   . Muscle weakness (generalized)   . Osteoarthrosis, unspecified whether generalized or localized, lower leg    osteoarthritis - upper thoracic area, left knee.  . Other abnormal glucose   . Other B-complex deficiencies   . Other malaise and fatigue   . Palpitations   . Senile cataract, unspecified   . Shortness of breath   . Stroke [facility])    small "stroke" - right sided weakness for a day.  . Tachycardia, unspecified   . Type II or unspecified type diabetes mellitus with renal  manifestations, not stated as uncontrolled(250.40)    "prediabetes"-no meds.  . Undiagnosed cardiac murmurs   . Unspecified essential hypertension   . Unspecified vitamin D deficiency     Past Surgical History:  Procedure Laterality Date  . CATARACT EXTRACTION, BILATERAL Bilateral   . EYE SURGERY     cataract  . TOE AMPUTATION  2002  . TONSILLECTOMY  1944  . TONSILLECTOMY     younger yrs  . TOTAL KNEE ARTHROPLASTY Left 01/15/2015   Procedure: LEFT TOTAL KNEE ARTHROPLASTY;  Surgeon: Eugenia Mcalpine, MD;  Location: [facility];  Service: Orthopedics;  Laterality: Left;  . TUBAL LIGATION  1951     reports that she has never smoked. She has never used smokeless tobacco. She reports that she drinks alcohol. She reports that she does not use drugs.  Allergies  Allergen Reactions  . Ace Inhibitors Cough  . Lisinopril Cough  . Simvastatin Other (See Comments)    Body aches and pains    Family History  Problem Relation Age of Onset  . Cerebral aneurysm Mother   . Heart attack Father   . Alzheimer's disease Sister   . Emphysema Sister   . Stroke Sister   . Alzheimer's disease Sister   . Arthritis Sister     Prior to Admission medications   Medication Sig Start Date End Date Taking? Authorizing Provider  b complex vitamins tablet Take 1 tablet by mouth daily.  Yes [provider]  carvedilol (COREG) 12.5 MG tablet Take one tablet by mouth twice daily with meals for blood pressure 07/04/17  Yes Reed, Tiffany L, DO  clopidogrel (PLAVIX) 75 MG tablet Take 1 tablet (75 mg total) by mouth daily with breakfast. 07/04/17  Yes Reed, Tiffany L, DO  Ergocalciferol (VITAMIN D2) 400 UNITS TABS Take 1 tablet by mouth daily.    Yes [provider]  fluticasone (FLONASE) 50 MCG/ACT nasal spray Place 2 sprays into both nostrils daily as needed for allergies or rhinitis.    Yes [provider]  hydrochlorothiazide (HYDRODIURIL) 25 MG tablet Take 25 mg by mouth. Take one  tablet daily as needed for edema   Yes [provider]  mirtazapine (REMERON) 7.5 MG tablet Take 1 tablet (7.5 mg total) by mouth at bedtime. 07/04/17  Yes Reed, Tiffany L, DO  Multiple Vitamins-Minerals (PRESERVISION AREDS 2+MULTI VIT PO) Take 2 capsules by mouth daily.   Yes [provider]  traMADol (ULTRAM) 50 MG tablet Take 1 tablet (50 mg total) by mouth 2 (two) times daily as needed. 07/04/17  Yes Kermit Balo, DO    Physical Exam: Vitals:   07/17/17 1332 07/17/17 1340  BP:  (!) 131/57  Pulse:  61  Resp:  17  Temp:  97.9 F (36.6 C)  TempSrc:  Oral  SpO2:  98%  Weight: 70.8 kg (156 lb)   Height:  (1.702 m)       Constitutional: NAD, calm, comfortable Vitals:   07/17/17 1332 07/17/17 1340  BP:  (!) 131/57  Pulse:  61  Resp:  17  Temp:  97.9 F (36.6 C)  TempSrc:  Oral  SpO2:  98%  Weight: 70.8 kg (156 lb)   Height:  (1.702 m)    Eyes: PERRL, lids and conjunctivae normal ENMT: Mucous membranes are moist. Posterior pharynx clear of any exudate or lesions.Normal dentition.  Neck: normal, supple, no masses, no thyromegaly Respiratory: clear to auscultation bilaterally, no wheezing, no crackles. Normal respiratory effort. No accessory muscle use.  Cardiovascular: Regular rate and rhythm, no murmurs / rubs / gallops. No extremity edema. 2+ pedal pulses. No carotid bruits.  Abdomen: no tenderness, no masses palpated. No hepatosplenomegaly. Bowel sounds positive.  Musculoskeletal: no clubbing / cyanosis. No joint deformity upper and lower extremities. Good ROM, no contractures. Normal muscle tone.  Skin: no rashes, lesions, ulcers. No induration Neurologic: CN 2-12 grossly intact. Sensation intact, DTR normal. Strength 5/5 in all 4.  Psychiatric: Normal judgment and insight. Alert and oriented x 3. Normal mood.   Labs on Admission: I have personally reviewed following labs and imaging studies  CBC: Recent Labs  Lab 07/17/17 1355  WBC 7.3    HGB 11.5*  HCT 36.0  MCV 90.7  PLT 175   Basic Metabolic Panel: Recent Labs  Lab 07/17/17 1355  NA 139  K 4.3  CL 109  CO2 24  GLUCOSE 193*  BUN 30*  CREATININE 1.16*  CALCIUM 8.5*   GFR: Estimated Creatinine Clearance: 29.5 mL/min (A) (by C-G formula based on SCr of 1.16 mg/dL (H)). Liver Function Tests: No results for input(s): AST, ALT, ALKPHOS, BILITOT, PROT, ALBUMIN in the last 168 hours. No results for input(s): LIPASE, AMYLASE in the last 168 hours. No results for input(s): AMMONIA in the last 168 hours. Coagulation Profile: No results for input(s): INR, PROTIME in the last 168 hours. Cardiac Enzymes: No results for input(s): CKTOTAL, CKMB, CKMBINDEX, TROPONINI in the last  168 hours. BNP (last 3 results) No results for input(s): PROBNP in the last 8760 hours. HbA1C: No results for input(s): HGBA1C in the last 72 hours. CBG: Recent Labs  Lab 07/17/17 1351  GLUCAP 179*   Lipid Profile: No results for input(s): CHOL, HDL, LDLCALC, TRIG, CHOLHDL, LDLDIRECT in the last 72 hours. Thyroid Function Tests: No results for input(s): TSH, T4TOTAL, FREET4, T3FREE, THYROIDAB in the last 72 hours. Anemia Panel: No results for input(s): VITAMINB12, FOLATE, FERRITIN, TIBC, IRON, RETICCTPCT in the last 72 hours. Urine analysis:    Component Value Date/Time   COLORURINE YELLOW 01/08/2015 1005   APPEARANCEUR CLOUDY (A) 01/08/2015 1005   LABSPEC 1.017 01/08/2015 1005   PHURINE 6.0 01/08/2015 1005   GLUCOSEU NEGATIVE 01/08/2015 1005   HGBUR TRACE (A) 01/08/2015 1005   BILIRUBINUR neg 06/12/2017 1019   KETONESUR NEGATIVE 01/08/2015 1005   PROTEINUR 2000 ++++ 06/12/2017 1019   PROTEINUR NEGATIVE 01/08/2015 1005   UROBILINOGEN 0.2 06/12/2017 1019   UROBILINOGEN 0.2 01/08/2015 1005   NITRITE neg 06/12/2017 1019   NITRITE NEGATIVE 01/08/2015 1005   LEUKOCYTESUR Large (3+) (A) 06/12/2017 1019   Sepsis Labs: (procalcitonin:4,lacticidven:4) )No results found for  this or any previous visit (from the past 240 hour(s)).   Radiological Exams on Admission: No results found.  EKG: Independently reviewed. Showing sinus rhythm with a rate of 67. No new changes.  Assessment/Plan Principal Problem:   Syncope Active Problems:   History of cardiovascular disorder   Chronic kidney disease, stage III (moderate) (HCC)   Hyperlipidemia   Controlled type 2 diabetes mellitus with stage 3 chronic kidney disease, without long-term current use of insulin (HCC)   #1 syncope versus seizure: Patient's fall could be related to cardiac syncope versus arrhythmia versus CVA. She has history of CVA in the past and will be the cause of another CVA or seizure from that. Patient also has extensive cardiac history with previous history of tachycardia. Other possibilities are vasovagal syncope but less likely. We will check MRI of the brain, carotid Dopplers as well as echocardiogram. Orthostatics will be monitored also. Physical therapy and occupational therapy consultations.  #2 chronic kidney disease stage III: BUN/creatinine appears to be at baseline. Continue monitoring the hospital  #3 diabetes type 2: Appears well controlled. Sliding scale insulin and home regimen will be continued with.  #4 history of cardiac disease: Patient has a multiple different cardiac conditions over the years but overall has remained stable. She's had episodes of tachycardia before. Noted committed ischemic cardiomyopathy however. We will cycle her enzymes as part of her syncopal workup.   DVT prophylaxis: Lovenox  Code Status: full  Family Communication: Daughter at bedside Disposition Plan: To SNF Consults called: None  Admission status: Observation   Severity of Illness: The appropriate patient status for this patient is OBSERVATION. Observation status is judged to be reasonable and necessary in order to provide the required intensity of service to ensure the patient's safety. The  patient's presenting symptoms, physical exam findings, and initial radiographic and laboratory data in the context of their medical condition is felt to place them at decreased risk for further clinical deterioration. Furthermore, it is anticipated that the patient will be medically stable for discharge from the hospital within 2 midnights of admission. The following factors support the patient status of observation.   " The patient's presenting symptoms include Passing out. " The physical exam findings include None at this point. " The initial radiographic and laboratory data are Normal.  Lonia Blood MD [facility] Pager [phone]  If 7PM-7AM, please contact night-coverage [email or web address] Password [email or web address]  07/17/2017, 4:11 PM

## 2017-07-17 NOTE — ED Notes (Signed)
Report taken from Chris, RN .

## 2017-07-17 NOTE — ED Notes (Signed)
Pt states having difficultly going to sleep, requesting PRN meds. Stated have took benadryl and ativan in the past  To sleep and ease anxiety.

## 2017-07-17 NOTE — ED Notes (Signed)
Paged admitting Lisa Hawthorne, MD regarding BP 193/93 and urinalysis pended since 1335 to assess need for bladder scan (which isn't readily available on 5C-ED Overflow) and in/out cath.

## 2017-07-17 NOTE — ED Triage Notes (Signed)
Per ems- pt was at [employer] eating lunch, finished her lunch, daughter says her color changed she got white, and she slumped over, couldn't get her to response. A bystander reports she was apneic for 30 sec, the syncope lasted 2 mins. She was lowered to the floor, then started breathing again, then started to come around. When ems arrived she was a x 4. Orthostatics supine 112/60, standing 110/56. She denies any complaints. EKG unremarkable. CBG 211. BP 152/76 most recent. 20 L. AC

## 2017-07-17 NOTE — ED Notes (Signed)
Report attempted 

## 2017-07-18 ENCOUNTER — Observation Stay (HOSPITAL_COMMUNITY): Payer: Medicare Other

## 2017-07-18 ENCOUNTER — Observation Stay (HOSPITAL_BASED_OUTPATIENT_CLINIC_OR_DEPARTMENT_OTHER): Payer: Medicare Other

## 2017-07-18 DIAGNOSIS — R55 Syncope and collapse: Secondary | ICD-10-CM | POA: Diagnosis not present

## 2017-07-18 DIAGNOSIS — I951 Orthostatic hypotension: Secondary | ICD-10-CM

## 2017-07-18 DIAGNOSIS — N183 Chronic kidney disease, stage 3 (moderate): Secondary | ICD-10-CM | POA: Diagnosis not present

## 2017-07-18 LAB — ECHOCARDIOGRAM COMPLETE
Height: 67 in
WEIGHTICAEL: 2440 [oz_av]

## 2017-07-18 LAB — BASIC METABOLIC PANEL
ANION GAP: 11 (ref 5–15)
BUN: 23 mg/dL — ABNORMAL HIGH (ref 6–20)
CALCIUM: 8.9 mg/dL (ref 8.9–10.3)
CO2: 25 mmol/L (ref 22–32)
CREATININE: 0.84 mg/dL (ref 0.44–1.00)
Chloride: 105 mmol/L (ref 101–111)
GFR calc non Af Amer: 58 mL/min — ABNORMAL LOW (ref >60)
Glucose, Bld: 108 mg/dL — ABNORMAL HIGH (ref 65–99)
Potassium: 3.6 mmol/L (ref 3.5–5.1)
SODIUM: 141 mmol/L (ref 135–145)

## 2017-07-18 LAB — URINALYSIS, ROUTINE W REFLEX MICROSCOPIC
Bilirubin Urine: NEGATIVE
GLUCOSE, UA: NEGATIVE mg/dL
HGB URINE DIPSTICK: NEGATIVE
KETONES UR: NEGATIVE mg/dL
LEUKOCYTES UA: NEGATIVE
Nitrite: NEGATIVE
PROTEIN: NEGATIVE mg/dL
Specific Gravity, Urine: 1.006 (ref 1.005–1.030)
pH: 7 (ref 5.0–8.0)

## 2017-07-18 LAB — CBC
HCT: 35.9 % — ABNORMAL LOW (ref 36.0–46.0)
HEMOGLOBIN: 11.6 g/dL — AB (ref 12.0–15.0)
MCH: 29 pg (ref 26.0–34.0)
MCHC: 32.3 g/dL (ref 30.0–36.0)
MCV: 89.8 fL (ref 78.0–100.0)
PLATELETS: 176 10*3/uL (ref 150–400)
RBC: 4 MIL/uL (ref 3.87–5.11)
RDW: 14.3 % (ref 11.5–15.5)
WBC: 7.8 10*3/uL (ref 4.0–10.5)

## 2017-07-18 LAB — GLUCOSE, CAPILLARY: GLUCOSE-CAPILLARY: 118 mg/dL — AB (ref 65–99)

## 2017-07-18 MED ORDER — CARVEDILOL 6.25 MG PO TABS: 6.2500 mg | ORAL_TABLET | Freq: Two times a day (BID) | ORAL | 0 refills | Status: DC

## 2017-07-18 MED ORDER — PERFLUTREN LIPID MICROSPHERE
1.0000 mL | INTRAVENOUS | Status: AC | PRN
  Administered 2017-07-18: 2 mL via INTRAVENOUS
  Filled 2017-07-18: qty 10

## 2017-07-18 NOTE — Discharge Instructions (Addendum)
Please get your medications reviewed and adjusted by your Primary MD. ° °Please request your Primary MD to go over all Hospital Tests and Procedure/Radiological results at the follow up, please get all Hospital records sent to your Prim MD by signing hospital release before you go home. ° °If you had Pneumonia of Lung problems at the Hospital: °Please get a 2 view Chest X ray done in 6-8 weeks after hospital discharge or sooner if instructed by your Primary MD. ° °If you have Congestive Heart Failure: °Please call your Cardiologist or Primary MD anytime you have any of the following symptoms:  °1) 3 pound weight gain in 24 hours or 5 pounds in 1 week  °2) shortness of breath, with or without a dry hacking cough  °3) swelling in the hands, feet or stomach  °4) if you have to sleep on extra pillows at night in order to breathe ° °Follow cardiac low salt diet and 1.5 lit/day fluid restriction. ° °If you have diabetes °Accuchecks 4 times/day, Once in AM empty stomach and then before each meal. °Log in all results and show them to your primary doctor at your next visit. °If any glucose reading is under 80 or above 300 call your primary MD immediately. ° °If you have Seizure/Convulsions/Epilepsy: °Please do not drive, operate heavy machinery, participate in activities at heights or participate in high speed sports until you have seen by Primary MD or a Neurologist and advised to do so again. ° °If you had Gastrointestinal Bleeding: °Please ask your Primary MD to check a complete blood count within one week of discharge or at your next visit. Your endoscopic/colonoscopic biopsies that are pending at the time of discharge, will also need to followed by your Primary MD. ° °Get Medicines reviewed and adjusted. °Please take all your medications with you for your next visit with your Primary MD ° °Please request your Primary MD to go over all hospital tests and procedure/radiological results at the follow up, please ask your  Primary MD to get all Hospital records sent to his/her office. ° °If you experience worsening of your admission symptoms, develop shortness of breath, life threatening emergency, suicidal or homicidal thoughts you must seek medical attention immediately by calling 911 or calling your MD immediately  if symptoms less severe. ° °You must read complete instructions/literature along with all the possible adverse reactions/side effects for all the Medicines you take and that have been prescribed to you. Take any new Medicines after you have completely understood and accpet all the possible adverse reactions/side effects.  ° °Do not drive or operate heavy machinery when taking Pain medications.  ° °Do not take more than prescribed Pain, Sleep and Anxiety Medications ° °Special Instructions: If you have smoked or chewed Tobacco  in the last 2 yrs please stop smoking, stop any regular Alcohol  and or any Recreational drug use. ° °Wear Seat belts while driving. ° °Please note °You were cared for by a hospitalist during your hospital stay. If you have any questions about your discharge medications or the care you received while you were in the hospital after you are discharged, you can call the unit and asked to speak with the hospitalist on call if the hospitalist that took care of you is not available. Once you are discharged, your primary care physician will handle any further medical issues. Please note that NO REFILLS for any discharge medications will be authorized once you are discharged, as it is imperative that you   return to your primary care physician (or establish a relationship with a primary care physician if you do not have one) for your aftercare needs so that they can reassess your need for medications and monitor your lab values. ° °You can reach the hospitalist office at phone [phone] or fax [phone] °  °If you do not have a primary care physician, you can call [phone] for a physician  referral. ° ° ° ° °Syncope °Syncope is when you temporarily lose consciousness. Syncope may also be called fainting or passing out. It is caused by a sudden decrease in blood flow to the brain. Even though most causes of syncope are not dangerous, syncope can be a sign of a serious medical problem. Signs that you may be about to faint include: °· Feeling dizzy or light-headed. °· Feeling nauseous. °· Seeing all white or all black in your field of vision. °· Having cold, clammy skin. ° °If you fainted, get medical help right away.Call your local emergency services (911 in the [place] Do not drive yourself to the hospital. °Follow these instructions at home: °Pay attention to any changes in your symptoms. Take these actions to help with your condition: °· Have someone stay with you until you feel stable. °· Do not drive, use machinery, or play sports until your health care provider says it is okay. °· Keep all follow-up visits as told by your health care provider. This is important. °· If you start to feel like you might faint, lie down right away and raise (elevate) your feet above the level of your heart. Breathe deeply and steadily. Wait until all of the symptoms have passed. °· Drink enough fluid to keep your urine clear or pale yellow. °· If you are taking blood pressure or heart medicine, get up slowly and take several minutes to sit and then stand. This can reduce dizziness. °· Take over-the-counter and prescription medicines only as told by your health care provider. ° °Get help right away if: °· You have a severe headache. °· You have unusual pain in your chest, abdomen, or back. °· You are bleeding from your mouth or rectum, or you have black or tarry stool. °· You have a very fast or irregular heartbeat (palpitations). °· You have pain with breathing. °· You faint once or repeatedly. °· You have a seizure. °· You are confused. °· You have trouble walking. °· You have severe weakness. °· You have vision  problems. °These symptoms may represent a serious problem that is an emergency. Do not wait to see if your symptoms will go away. Get medical help right away. Call your local emergency services (911 in the [place] Do not drive yourself to the hospital. °This information is not intended to replace advice given to you by your health care provider. Make sure you discuss any questions you have with your health care provider. °Document Released: 03/06/2005 Document Revised: 08/12/2015 Document Reviewed: 11/18/2014 °Elsevier Interactive Patient Education © 2018 [employer] ° ° ° °Orthostatic Hypotension °Orthostatic hypotension is a sudden drop in blood pressure that happens when you quickly change positions, such as when you get up from a seated or lying position. Blood pressure is a measurement of how strongly, or weakly, your blood is pressing against the walls of your arteries. Arteries are blood vessels that carry blood from your heart throughout your body. When blood pressure is too low, you may not get enough blood to your brain or to the rest of your organs. This   can cause weakness, light-headedness, rapid heartbeat, and fainting. This can last for just a few seconds or for up to a few minutes. °Orthostatic hypotension is usually not a serious problem. However, if it happens frequently or gets worse, it may be a sign of something more serious. °What are the causes? °This condition may be caused by: °· Sudden changes in posture, such as standing up quickly after you have been sitting or lying down. °· Blood loss. °· Loss of body fluids (dehydration). °· Heart problems. °· Hormone (endocrine) problems. °· Pregnancy. °· Severe infection. °· Lack of certain nutrients. °· Severe allergic reactions (anaphylaxis). °· Certain medicines, such as blood pressure medicine or medicines that make the body lose excess fluids (diuretics). Sometimes, this condition can be caused by not taking medicine as directed, such as taking  too much of a certain medicine. ° °What increases the risk? °Certain factors can make you more likely to develop orthostatic hypotension, including: °· Age. Risk increases as you get older. °· Conditions that affect the heart or the central nervous system. °· Taking certain medicines, such as blood pressure medicine or diuretics. °· Being pregnant. ° °What are the signs or symptoms? °Symptoms of this condition may include: °· Weakness. °· Light-headedness. °· Dizziness. °· Blurred vision. °· Fatigue. °· Rapid heartbeat. °· Fainting, in severe cases. ° °How is this diagnosed? °This condition is diagnosed based on: °· Your medical history. °· Your symptoms. °· Your blood pressure measurement. Your health care provider will check your blood pressure when you are: °? Lying down. °? Sitting. °? Standing. ° °A blood pressure reading is recorded as two numbers, such as "120 over 80" (or 120/80). The first ("top") number is called the systolic pressure. It is a measure of the pressure in your arteries as your heart beats. The second ("bottom") number is called the diastolic pressure. It is a measure of the pressure in your arteries when your heart relaxes between beats. Blood pressure is measured in a unit called mm Hg. Healthy blood pressure for adults is 120/80. If your blood pressure is below 90/60, you may be diagnosed with hypotension. °Other information or tests that may be used to diagnose orthostatic hypotension include: °· Your other vital signs, such as your heart rate and temperature. °· Blood tests. °· Tilt table test. For this test, you will be safely secured to a table that moves you from a lying position to an upright position. Your heart rhythm and blood pressure will be monitored during the test. ° °How is this treated? °Treatment for this condition may include: °· Changing your diet. This may involve eating more salt (sodium) or drinking more water. °· Taking medicines to raise your blood  pressure. °· Changing the dosage of certain medicines you are taking that might be lowering your blood pressure. °· Wearing compression stockings. These stockings help to prevent blood clots and reduce swelling in your legs. ° °In some cases, you may need to go to the hospital for: °· Fluid replacement. This means you will receive fluids through an IV tube. °· Blood replacement. This means you will receive donated blood through an IV tube (transfusion). °· Treating an infection or heart problems, if this applies. °· Monitoring. You may need to be monitored while medicines that you are taking wear off. ° °Follow these instructions at home: °Eating and drinking ° °· Drink enough fluid to keep your urine clear or pale yellow. °· Eat a healthy diet and follow instructions from your   health care provider about eating or drinking restrictions. A healthy diet includes: °? Fresh fruits and vegetables. °? Whole grains. °? Lean meats. °? Low-fat dairy products. °· Eat extra salt only as directed. Do not add extra salt to your diet unless your health care provider told you to do that. °· Eat frequent, small meals. °· Avoid standing up suddenly after eating. °Medicines °· Take over-the-counter and prescription medicines only as told by your health care provider. °? Follow instructions from your health care provider about changing the dosage of your current medicines, if this applies. °? Do not stop or adjust any of your medicines on your own. °General instructions °· Wear compression stockings as told by your health care provider. °· Get up slowly from lying down or sitting positions. This gives your blood pressure a chance to adjust. °· Avoid hot showers and excessive heat as directed by your health care provider. °· Return to your normal activities as told by your health care provider. Ask your health care provider what activities are safe for you. °· Do not use any products that contain nicotine or tobacco, such as cigarettes  and e-cigarettes. If you need help quitting, ask your health care provider. °· Keep all follow-up visits as told by your health care provider. This is important. °Contact a health care provider if: °· You vomit. °· You have diarrhea. °· You have a fever for more than 2-3 days. °· You feel more thirsty than usual. °· You feel weak and tired. °Get help right away if: °· You have chest pain. °· You have a fast or irregular heartbeat. °· You develop numbness in any part of your body. °· You cannot move your arms or your legs. °· You have trouble speaking. °· You become sweaty or feel lightheaded. °· You faint. °· You feel short of breath. °· You have trouble staying awake. °· You feel confused. °This information is not intended to replace advice given to you by your health care provider. Make sure you discuss any questions you have with your health care provider. °Document Released: 02/24/2002 Document Revised: 11/23/2015 Document Reviewed: 08/27/2015 °Elsevier Interactive Patient Education © 2018 [employer] ° °

## 2017-07-18 NOTE — Progress Notes (Signed)
Bilateral carotid duplex completed. Preliminary results - 1% to 39% ICA stenosis. Greater than 50% ECA stenosis bilaterally at the origin. Right vertebral appears occluded. Left vertebral flow is antegrade. [employer], RVS 07/18/2017 10:09 AM

## 2017-07-18 NOTE — Progress Notes (Signed)
PT Cancellation Note  Patient Details Name: Lisa [name] MRN: [id number] DOB: [birth year]   Cancelled Treatment:    Reason Eval/Treat Not Completed: Patient at procedure or test/unavailable. Patient off floor at Echo lab. Will follow up.  Laurina Bustle, PT, DPT Acute Rehabilitation Services  Pager: [phone]     Vanetta Mulders 07/18/2017, 10:20 AM

## 2017-07-18 NOTE — Discharge Summary (Signed)
Physician Discharge Summary  Lisa [name] ZOX:[id number] DOB: 06-06-1921  PCP: Kermit Balo, DO  Admit date: 07/17/2017 Discharge date: 07/18/2017  Recommendations for Outpatient Follow-up:  1. Dr. Bufford Spikes, PCP in 5 days.  Home Health: PT Equipment/Devices: None  Discharge Condition: Improved and stable. CODE STATUS: Full Diet recommendation: Healthy diet.  Discharge Diagnoses:  Principal Problem:   Syncope Active Problems:   History of cardiovascular disorder   Chronic kidney disease, stage III (moderate) (HCC)   Hyperlipidemia   Controlled type 2 diabetes mellitus with stage 3 chronic kidney disease, without long-term current use of insulin (HCC)   Brief Summary: 84 year old very pleasant female patient, lives alone in her apartment and independent, uses a cane when she goes out of her apartment, drives, PMH of stage III chronic kidney disease, HLD, stroke, prediabetes, HTN, palpitations/tachycardia, presented to [facility] ED on 4/30 after a syncopal episode at a restaurant.  She was admitted for observation and evaluation.  Assessment and plan:  1. Syncope: Suspect secondary to orthostatic hypotension +/- vasovagal.  Patient was in usual state of health on morning of admission.  She had a good breakfast.  Her daughter picked her up and they went shopping in [place] and subsequently went to lunch at [employer] and [employer].  Patient, her daughter and a friend were together.  Patient finished eating her salad, bread roll and some past.  This was around 12:30 PM-1 PM.  Patient yawned a couple times and then according to her daughter became "pale like a ghost".  Daughter and friend asked patient if she was doing okay and she replied in the affirmative.  However patient was noted to become progressively unwell and started leaning to the right.  She then became unresponsive and her head flopped forward.  Daughter sought assistance from 2 workers at [employer] and  retired paramedic came by as well.  He assisted with extending her neck to open her airways and she made a funny sound.  She apparently was not breathing and he was unable to find a pulse.  They then laid her down on the ground and were about to start CPR when patient woke up and progressively improved after that.  She denied preceding chest pain, palpitations, dizziness or lightheadedness.  No asymmetric limb weakness, tingling, facial asymmetry noted by family or patient.  Patient denies prior such episodes.  She was apparently unresponsive for about a minute.  No seizure-like activity or bowel or bladder incontinence were reported.  She was monitored on telemetry which showed sinus rhythm and occasional sinus bradycardia in the high 50s without arrhythmia.  EKG showed sinus rhythm, RBBB (not new) and no acute changes.  POC troponin x1: Negative.  MRI of brain showed no acute intracranial process.  Carotid Dopplers revealed 1-39% bilateral ICA stenosis.  TTE showed LVEF 60-65% and grade 1 diastolic dysfunction.  Orthostatic blood pressure checks were positive in the ED on night of admission (BP dropped from 187/82 lying to 158/75 standing) and again this morning with PT (158/68 lying to 88/53 standing) however patient was asymptomatic on ambulating with PT and reported no dizziness, lightheadedness, chest pain or dyspnea.  Although blood pressures on night of admission were significantly elevated, her supine blood pressures today are only mildly uncontrolled in the 150s-160s despite being placed on reduced dose of carvedilol from 12.5 to 6.25 mg twice daily.  Patient reports that she has not taken her thiazide diuretics in months which she used to use as needed  for left leg chronic edema.  Patient denies recent GI illness or exposure to prolonged heat.  Suspect that she had syncope likely related to orthostatic hypotension.  Clinically she appears euvolemic.  Patient and daughter were counseled extensively  regarding preventative measures to be taken to avoid orthostatic hypotension including adequate hydration, gradual change in positions when attempting to get up from lying, bilateral lower extremity compression stockings.  Patient was also advised that she should not drive for 6 months or until cleared by her physician during outpatient follow-up.  Patient states that she has a upcoming flight trip mid May and asking if she can make the trip for her grandson's graduation.  I advised her that she probably could go but will defer final clearance to her PCP during follow-up next week.  Obviously if she has recurrent episodes of syncope, she will need further evaluation including cardiology consultation and placement of a heart monitor.  TSH normal at 1.486.  Low index of suspicion for seizure or CVA.  PT evaluated and recommended home health PT.  Patient's daughter plans to stay with her mother for the next week to week and a half and provide 24/7 supervision and assistance as needed. 2. Orthostatic hypotension: Management as indicated above. 3. Essential hypertension: Carvedilol was reduced from 12.5-6.25 twice daily.  Follow-up with PCP closely next week and consider adjusting medications as needed.  Discontinued HCTZ which she was not taking anyway. 4. Normocytic anemia: Likely chronic disease.  Stable. 5. Prediabetes: A1c was 5.6 on 04/17/2016. 6. Stage III chronic kidney disease: Creatinine normal/stable. 7. Palpitations/tachycardia: Patient reports no recent change in any of her medications.  As stated above, carvedilol dose was reduced during this hospital admission.  Outpatient follow-up. 8. History of CVA: Continue Plavix.   Consultations:  None  Procedures:  As above   Discharge Instructions  Discharge Instructions    Call MD for:   Complete by:  As directed    Passing out or feeling like passing out.   Call MD for:  extreme fatigue   Complete by:  As directed    Call MD for:   persistant dizziness or light-headedness   Complete by:  As directed    Diet - low sodium heart healthy   Complete by:  As directed    Discharge instructions   Complete by:  As directed    Do not drive for 6 months or until advised by your physician during outpatient office visit.   Increase activity slowly   Complete by:  As directed        Medication List    STOP taking these medications   hydrochlorothiazide 25 MG tablet Commonly known as:  HYDRODIURIL     TAKE these medications   b complex vitamins tablet Take 1 tablet by mouth daily.   carvedilol 6.25 MG tablet Commonly known as:  COREG Take 1 tablet (6.25 mg total) by mouth 2 (two) times daily with a meal. What changed:    medication strength  how much to take  how to take this  when to take this  additional instructions   clopidogrel 75 MG tablet Commonly known as:  PLAVIX Take 1 tablet (75 mg total) by mouth daily with breakfast.   fluticasone 50 MCG/ACT nasal spray Commonly known as:  FLONASE Place 2 sprays into both nostrils daily as needed for allergies or rhinitis.   mirtazapine 7.5 MG tablet Commonly known as:  REMERON Take 1 tablet (7.5 mg total) by mouth at bedtime.  PRESERVISION AREDS 2+MULTI VIT PO Take 2 capsules by mouth daily.   traMADol 50 MG tablet Commonly known as:  ULTRAM Take 1 tablet (50 mg total) by mouth 2 (two) times daily as needed.   Vitamin D2 400 units Tabs Take 1 tablet by mouth daily.      Follow-up Information    Reed, Tiffany L, DO. Schedule an appointment as soon as possible for a visit in 5 day(s).   Specialty:  Geriatric Medicine Contact information: [address] [place] [address] [phone]          Allergies  Allergen Reactions  . Ace Inhibitors Cough  . Lisinopril Cough  . Simvastatin Other (See Comments)    Body aches and pains      Procedures/Studies: Mr Brain Wo Contrast  Result Date: 07/17/2017 CLINICAL DATA:  Syncope episode  at lunch. History of stroke, hypertension, diabetes. EXAM: MRI HEAD WITHOUT CONTRAST TECHNIQUE: Multiplanar, multiecho pulse sequences of the brain and surrounding structures were obtained without intravenous contrast. COMPARISON:  CT HEAD May 14, 2015 and MRI of the head April 10, 2013 FINDINGS: INTRACRANIAL CONTENTS: No reduced diffusion to suggest acute ischemia. No susceptibility artifact to suggest hemorrhage. Old small LEFT basal ganglia/corona radiata infarct. Old small LEFT cerebellar infarct. Punctate T2 hyperintensities bilateral basal ganglia and thalami seen with chronic small vessel ischemic disease. The ventricles and sulci are normal for patient's age. Patchy supratentorial pontine white matter FLAIR T2 hyperintensities. No suspicious parenchymal signal, masses, mass effect. No abnormal extra-axial fluid collections. No extra-axial masses. VASCULAR: Normal major intracranial vascular flow voids present at skull base. SKULL AND UPPER CERVICAL SPINE: No abnormal sellar expansion. Heterogeneous calvarial bone marrow signal. Craniocervical junction maintained. SINUSES/ORBITS: Trace paranasal sinus mucosal thickening. Mastoid air cells are well aerated.The included ocular globes and orbital contents are non-suspicious. Status post bilateral ocular lens implants. OTHER: None. IMPRESSION: 1. No acute intracranial process. 2. Old small LEFT basal ganglia/corona radiata and LEFT cerebellar infarcts. 3. Mild-to-moderate chronic small vessel ischemic disease. Electronically Signed   By: Awilda Metro M.D.   On: 07/17/2017 20:17      Subjective: Patient interviewed and examined along with her daughter at bedside.  PT in the room as well.  Patient states that she feels "fine" and denies any recurrence of symptoms.  No dizziness, lightheadedness, chest pain, dyspnea or palpitations reported.  Denies prior such syncopal episodes.  Discharge Exam:  Vitals:   07/18/17 1317 07/18/17 1409 07/18/17  1412 07/18/17 1723  BP: 129/71 (!) 97/50 (!) 73/44 (!) 162/83  Pulse: 71 76 80 86  Resp: 18   19  Temp: 97.9 F (36.6 C)   97.9 F (36.6 C)  TempSrc: Oral   Oral  SpO2: 97%   97%  Weight:      Height:        General: Pleasant elderly female, moderately built and nourished, lying comfortably propped up in bed.  Oral mucosa moist. Cardiovascular: S1 & S2 heard, RRR, S1/S2 +. No murmurs, rubs, gallops or clicks. No JVD or pedal edema.  Telemetry personally reviewed: Sinus rhythm.  Occasional sinus bradycardia in the high 50s. Respiratory: Clear to auscultation without wheezing, rhonchi or crackles. No increased work of breathing. Abdominal:  Non distended, non tender & soft. No organomegaly or masses appreciated. Normal bowel sounds heard. CNS: Alert and oriented. No focal deficits. Extremities: no edema, no cyanosis    The results of significant diagnostics from this hospitalization (including imaging, microbiology, ancillary and laboratory) are listed  below for reference.      Labs: CBC: Recent Labs  Lab 07/17/17 1355 07/17/17 1900 07/18/17 0520  WBC 7.3 7.1 7.8  HGB 11.5* 11.9* 11.6*  HCT 36.0 37.5 35.9*  MCV 90.7 90.4 89.8  PLT 175 178 176   Basic Metabolic Panel: Recent Labs  Lab 07/17/17 1355 07/17/17 1900 07/18/17 0520  NA 139  --  141  K 4.3  --  3.6  CL 109  --  105  CO2 24  --  25  GLUCOSE 193*  --  108*  BUN 30*  --  23*  CREATININE 1.16* 0.92 0.84  CALCIUM 8.5*  --  8.9  MG  --  2.1  --     CBG: Recent Labs  Lab 07/17/17 1351 07/18/17 0759  GLUCAP 179* 118*   Thyroid function studies Recent Labs    07/17/17 1900  TSH 1.486    Urinalysis    Component Value Date/Time   COLORURINE STRAW (A) 07/18/2017 0127   APPEARANCEUR CLEAR 07/18/2017 0127   LABSPEC 1.006 07/18/2017 0127   PHURINE 7.0 07/18/2017 0127   GLUCOSEU NEGATIVE 07/18/2017 0127   HGBUR NEGATIVE 07/18/2017 0127   BILIRUBINUR NEGATIVE 07/18/2017 0127   BILIRUBINUR neg  06/12/2017 1019   KETONESUR NEGATIVE 07/18/2017 0127   PROTEINUR NEGATIVE 07/18/2017 0127   UROBILINOGEN 0.2 06/12/2017 1019   UROBILINOGEN 0.2 01/08/2015 1005   NITRITE NEGATIVE 07/18/2017 0127   LEUKOCYTESUR NEGATIVE 07/18/2017 0127    Discussed in detail and multiple times with patient's daughter.  Updated care and answered questions.  Time coordinating discharge: 45 minutes  SIGNED:  Marcellus Scott, MD, FACP, [facility]. [facility] Pager [phone]  If 7PM-7AM, please contact night-coverage [email or web address] Password [email or web address] 07/18/2017, 5:59 PM

## 2017-07-18 NOTE — Evaluation (Signed)
Physical Therapy Evaluation Patient Details Name: Lisa [name] MRN: [id number] DOB: Jan 28, 1922 Today's Date: 07/18/2017   History of Present Illness  Pt. is a 84 y.o. F with significant PMH of stroke, type II diabetes mellitus, HTN, L TKA, admitted with syncopal episode.  Clinical Impression  Patient functional mobility presenting close to baseline. Able to ambulate 75 feet with no assistive device and supervision for dynamic balance. Patient reporting, "I walk slow and not very far normally." At this time, patient main deficit is orthostatic hypotension (see below). No subjective report of dizziness. Ordered TED hose for patient. No further acute PT needs at this time.  All education has been completed and the patient has no further questions. See below for any follow-up Physical Therapy or equipment needs. PT is signing off. Thank you for this referral.   Supine: 158/68 Sitting: 135/66 Standing: 88/53    Follow Up Recommendations Home health PT;Supervision/Assistance - 24 hour    Equipment Recommendations  None recommended by PT    Recommendations for Other Services       Precautions / Restrictions Precautions Precautions: Fall Precaution Comments: watch BP Restrictions Weight Bearing Restrictions: No      Mobility  Bed Mobility Overal bed mobility: Modified Independent                Transfers Overall transfer level: Modified independent Equipment used: None                Ambulation/Gait Ambulation/Gait assistance: Min guard Ambulation Distance (Feet): 75 Feet Assistive device: None Gait Pattern/deviations: Step-through pattern;Decreased stride length Gait velocity: decreased Gait velocity interpretation: <1.31 ft/sec, indicative of household ambulator General Gait Details: Decreased stride length, foot clearance, and reciprocal arm swing.   Stairs            Wheelchair Mobility    Modified Rankin (Stroke Patients Only)        Balance Overall balance assessment: Needs assistance Sitting-balance support: No upper extremity supported;Feet supported Sitting balance-Leahy Scale: Good     Standing balance support: No upper extremity supported;During functional activity Standing balance-Leahy Scale: Good Standing balance comment: Needs supervision for dynamic balance                             Pertinent Vitals/Pain Pain Assessment: No/denies pain    Home Living Family/patient expects to be discharged to:: Private residence Living Arrangements: Alone Available Help at Discharge: Family;Available 24 hours/day((for first 2 weeks)) Type of Home: House Home Access: Stairs to enter Entrance Stairs-Rails: Can reach both Entrance Stairs-Number of Steps: 2 Home Layout: One level Home Equipment: Shower seat;Grab bars - tub/shower;Cane - single point      Prior Function Level of Independence: Independent with assistive device(s)         Comments: Uses cane for community ambulation. Independent with cooking; housekeeper comes 2x/week. Enjoys going to church. [employer]        Extremity/Trunk Assessment   Upper Extremity Assessment Upper Extremity Assessment: Overall WFL for tasks assessed    Lower Extremity Assessment Lower Extremity Assessment: Overall WFL for tasks assessed       Communication   Communication: No difficulties  Cognition Arousal/Alertness: Awake/alert Behavior During Therapy: WFL for tasks assessed/performed Overall Cognitive Status: Within Functional Limits for tasks assessed  General Comments General comments (skin integrity, edema, etc.): Patient daughter present    Exercises     Assessment/Plan    PT Assessment All further PT needs can be met in the next venue of care  PT Problem List Decreased balance;Decreased mobility       PT Treatment Interventions      PT Goals (Current goals  can be found in the Care Plan section)  Acute Rehab PT Goals Patient Stated Goal: Be independent PT Goal Formulation: With patient Time For Goal Achievement: 08/01/17 Potential to Achieve Goals: Good    Frequency     Barriers to discharge        Co-evaluation               AM-PAC PT "6 Clicks" Daily Activity  Outcome Measure Difficulty turning over in bed (including adjusting bedclothes, sheets and blankets)?: None Difficulty moving from lying on back to sitting on the side of the bed? : A Little Difficulty sitting down on and standing up from a chair with arms (e.g., wheelchair, bedside commode, etc,.)?: A Little Help needed moving to and from a bed to chair (including a wheelchair)?: A Little Help needed walking in hospital room?: A Little Help needed climbing 3-5 steps with a railing? : A Little 6 Click Score: 19    End of Session Equipment Utilized During Treatment: Gait belt Activity Tolerance: Patient tolerated treatment well Patient left: in chair;with call bell/phone within reach;with family/visitor present Nurse Communication: Mobility status PT Visit Diagnosis: Unsteadiness on feet (R26.81);Dizziness and giddiness (R42)    Time: 1140-1200 PT Time Calculation (min) (ACUTE ONLY): 20 min   Charges:   PT Evaluation $PT Eval Moderate Complexity: 1 Mod     PT G Codes:        Ellamae Sia, PT, DPT Acute Rehabilitation Services  Pager: [phone]   Willy Eddy 07/18/2017, 1:20 PM

## 2017-07-18 NOTE — Progress Notes (Signed)
Echocardiogram 2D Echocardiogram with Definity has been performed.  Lisa [name] A 07/18/2017, 11:08 AM

## 2017-07-18 NOTE — Progress Notes (Signed)
I came to do the echo but the patient states she don't see the need for it as she has already had three normal echocardiograms in her life. She may change her mind after she talks to the doctor later.

## 2017-07-20 ENCOUNTER — Telehealth: Payer: Self-pay

## 2017-07-20 NOTE — Telephone Encounter (Signed)
Transition Care Management Follow-Up Telephone Call   Date discharged and where: [facility] on 07/18/2017  How have you been since you were released from the hospital? No lightheaded or dizziness since being home. No other syncope episodes.  Any patient concerns? None  Items Reviewed:   Meds: Y, reduced carvedilol dose and discontinued HCTZ as directed  Allergies: Y  Dietary Changes Reviewed: Y  Functional Questionnaire:  Independent-I Dependent-D  ADLs:   Dressing- I    Eating- I   Maintaining continence- I   Transferring- I w/ assist of cane   Transportation- I   Meal Prep- I   Managing Meds- I  Confirmed importance and Date/Time of follow-up visits scheduled: Yes, Dr. Renato Gails 07/23/2017 @ 3pm   Confirmed with patient if condition worsens to call PCP or go to the Emergency Dept. Patient was given office number and encouraged to call back with questions or concerns: Yes

## 2017-07-23 ENCOUNTER — Ambulatory Visit (INDEPENDENT_AMBULATORY_CARE_PROVIDER_SITE_OTHER): Payer: Medicare Other | Admitting: Internal Medicine

## 2017-07-23 ENCOUNTER — Other Ambulatory Visit: Payer: Self-pay

## 2017-07-23 ENCOUNTER — Encounter: Payer: Self-pay | Admitting: Internal Medicine

## 2017-07-23 VITALS — BP 150/80 | HR 68 | Temp 98.4°F | Ht 67.0 in | Wt 155.0 lb

## 2017-07-23 DIAGNOSIS — R2689 Other abnormalities of gait and mobility: Secondary | ICD-10-CM

## 2017-07-23 DIAGNOSIS — R55 Syncope and collapse: Secondary | ICD-10-CM

## 2017-07-23 DIAGNOSIS — I129 Hypertensive chronic kidney disease with stage 1 through stage 4 chronic kidney disease, or unspecified chronic kidney disease: Secondary | ICD-10-CM

## 2017-07-23 DIAGNOSIS — Z8673 Personal history of transient ischemic attack (TIA), and cerebral infarction without residual deficits: Secondary | ICD-10-CM | POA: Diagnosis not present

## 2017-07-23 DIAGNOSIS — R300 Dysuria: Secondary | ICD-10-CM

## 2017-07-23 DIAGNOSIS — I951 Orthostatic hypotension: Secondary | ICD-10-CM | POA: Diagnosis not present

## 2017-07-23 DIAGNOSIS — N904 Leukoplakia of vulva: Secondary | ICD-10-CM | POA: Diagnosis not present

## 2017-07-23 NOTE — Patient Instructions (Signed)
Have fun in [facility]!   No driving for 6 months.   I'll be on the lookout for the report from the gynecologist tomorrow.

## 2017-07-23 NOTE — Progress Notes (Signed)
Location:  [facility] clinic Provider: Dyasia Firestine L. Renato Gails, D.O., C.M.D.  Code Status: DNR Goals of Care:  Advanced Directives 07/23/2017  Does Patient Have a Medical Advance Directive? Yes  Type of Estate agent of [place];Living will;Out of facility DNR (pink MOST or yellow form)  Does patient want to make changes to medical advance directive? No - Patient declined  Copy of Healthcare Power of Attorney in Chart? Yes  Would patient like information on creating a medical advance directive? -  Pre-existing out of facility DNR order (yellow form or pink MOST form) Yellow form placed in chart (order not valid for inpatient use)   Chief Complaint  Patient presents with  . Transitions Of Care     07/18/2017 (29 hours)    HPI: Patient is a 84 y.o. female seen today for hospital follow-up s/p admission from 07/17/17-07/18/17 for syncopal episode.    She was sitting eating lunch with a friend but before she knew it, she woke up on the floor.  A paramedic had helped her to the floor.  He was getting ready to do CPR but she came to.  He said she was not breathing b/c she had her head bent over.  She made a weird sound and came to.  He thought she was out 30s-1 min.   A pulse could not be felt at first either.   She'd been very very pale.  There was a weird color over her forehead and nose.  Her friend said she looked tired.  She told she was leaning right.  She said I know it.  She said I'm weak and then she was out.    She is on half her dose of coreg and ordered compression hose.  HCTZ was stopped completely.   BP was high at first here today, but improved to 140/70 on recheck.    Past Medical History:  Diagnosis Date  . Cervicalgia   . Chronic kidney disease, stage III (moderate) (HCC)   . Dermatophytosis of groin and perianal area   . Hyperlipidemia LDL goal < 100   . Insomnia, unspecified   . Muscle weakness (generalized)   . Osteoarthrosis, unspecified whether generalized or  localized, lower leg    osteoarthritis - upper thoracic area, left knee.  . Other abnormal glucose   . Other B-complex deficiencies   . Other malaise and fatigue   . Palpitations   . Senile cataract, unspecified   . Shortness of breath   . Stroke [facility])    small "stroke" - right sided weakness for a day.  . Tachycardia, unspecified   . Type II or unspecified type diabetes mellitus with renal manifestations, not stated as uncontrolled(250.40)    "prediabetes"-no meds.  . Undiagnosed cardiac murmurs   . Unspecified essential hypertension   . Unspecified vitamin D deficiency     Past Surgical History:  Procedure Laterality Date  . CATARACT EXTRACTION, BILATERAL Bilateral   . EYE SURGERY     cataract  . TOE AMPUTATION  2002  . TONSILLECTOMY  1944  . TONSILLECTOMY     younger yrs  . TOTAL KNEE ARTHROPLASTY Left 01/15/2015   Procedure: LEFT TOTAL KNEE ARTHROPLASTY;  Surgeon: Eugenia Mcalpine, MD;  Location: [facility];  Service: Orthopedics;  Laterality: Left;  . TUBAL LIGATION  1951    Allergies  Allergen Reactions  . Ace Inhibitors Cough  . Lisinopril Cough  . Simvastatin Other (See Comments)    Body aches and pains  Outpatient Encounter Medications as of 07/23/2017  Medication Sig  . b complex vitamins tablet Take 1 tablet by mouth daily.  . carvedilol (COREG) 6.25 MG tablet Take 1 tablet (6.25 mg total) by mouth 2 (two) times daily with a meal.  . clopidogrel (PLAVIX) 75 MG tablet Take 1 tablet (75 mg total) by mouth daily with breakfast.  . Ergocalciferol (VITAMIN D2) 400 UNITS TABS Take 1 tablet by mouth daily.   . fluticasone (FLONASE) 50 MCG/ACT nasal spray Place 2 sprays into both nostrils daily as needed for allergies or rhinitis.   . mirtazapine (REMERON) 7.5 MG tablet Take 1 tablet (7.5 mg total) by mouth at bedtime.  . Multiple Vitamins-Minerals (PRESERVISION AREDS 2+MULTI VIT PO) Take 2 capsules by mouth daily.  . traMADol (ULTRAM) 50 MG tablet Take 1 tablet (50 mg  total) by mouth 2 (two) times daily as needed.   No facility-administered encounter medications on file as of 07/23/2017.     Review of Systems:  Review of Systems  Constitutional: Negative for chills, fever and malaise/fatigue.  HENT: Positive for hearing loss. Negative for congestion.   Eyes: Negative for blurred vision.       Glasses  Respiratory: Negative for cough and shortness of breath.   Cardiovascular: Negative for chest pain, palpitations and leg swelling.  Gastrointestinal: Negative for abdominal pain, blood in stool, constipation, heartburn and melena.  Genitourinary: Positive for dysuria. Negative for frequency, hematuria and urgency.       Some incontinence  Musculoskeletal: Negative for falls and joint pain.  Neurological: Positive for loss of consciousness. Negative for dizziness and headaches.       Syncope  Endo/Heme/Allergies: Bruises/bleeds easily.  Psychiatric/Behavioral: Negative for depression and memory loss. The patient is not nervous/anxious and does not have insomnia.     Health Maintenance  Topic Date Due  . OPHTHALMOLOGY EXAM  11/09/2015  . URINE MICROALBUMIN  07/04/2016  . HEMOGLOBIN A1C  10/15/2016  . FOOT EXAM  04/27/2017  . INFLUENZA VACCINE  10/18/2017  . TETANUS/TDAP  05/21/2021  . PNA vac Low Risk Adult  Completed  . DEXA SCAN  Addressed    Physical Exam: Vitals:   07/23/17 1527 07/23/17 1531 07/23/17 1538  BP: (!) 160/80 140/70 (!) 150/80  Pulse: 72 76 68  Temp: 98.4 F (36.9 C)    TempSrc: Oral    SpO2: 97%    Weight: 155 lb (70.3 kg)    Height:  (1.702 m)     Body mass index is 24.28 kg/m. Physical Exam  Constitutional: She is oriented to person, place, and time. She appears well-developed and well-nourished. No distress.  Cardiovascular: Normal rate, regular rhythm, normal heart sounds and intact distal pulses.  Pulmonary/Chest: Effort normal and breath sounds normal. No respiratory distress.  Abdominal: Bowel sounds are  normal.  Musculoskeletal: Normal range of motion.  Using cane for balance since stroke  Neurological: She is alert and oriented to person, place, and time.  Skin: Skin is warm and dry.  Psychiatric: She has a normal mood and affect. Her behavior is normal. Judgment and thought content normal.    Labs reviewed: Basic Metabolic Panel: Recent Labs    10/24/16 0920 07/17/17 1355 07/17/17 1900 07/18/17 0520  NA 142 139  --  141  K 4.5 4.3  --  3.6  CL 107 109  --  105  CO2 23 24  --  25  GLUCOSE 111* 193*  --  108*  BUN 26* 30*  --  23*  CREATININE 1.03* 1.16* 0.92 0.84  CALCIUM 9.0 8.5*  --  8.9  MG  --   --  2.1  --   TSH  --   --  1.486  --    Liver Function Tests: Recent Labs    10/24/16 0920  AST 11  ALT 8  ALKPHOS 63  BILITOT 0.8  PROT 6.3  ALBUMIN 4.0   No results for input(s): LIPASE, AMYLASE in the last 8760 hours. No results for input(s): AMMONIA in the last 8760 hours. CBC: Recent Labs    10/24/16 0920 03/01/17 1536 07/17/17 1355 07/17/17 1900 07/18/17 0520  WBC 6.5 6.6 7.3 7.1 7.8  NEUTROABS 4,680 4,732  --   --   --   HGB 12.8 12.6 11.5* 11.9* 11.6*  HCT 39.7 38.0 36.0 37.5 35.9*  MCV 91.9 86.2 90.7 90.4 89.8  PLT 181 214 175 178 176   Lipid Panel: No results for input(s): CHOL, HDL, LDLCALC, TRIG, CHOLHDL, LDLDIRECT in the last 8760 hours. Lab Results  Component Value Date   HGBA1C 5.6 04/17/2016    Procedures since last visit: Mr Brain Wo Contrast  Result Date: 07/17/2017 CLINICAL DATA:  Syncope episode at lunch. History of stroke, hypertension, diabetes. EXAM: MRI HEAD WITHOUT CONTRAST TECHNIQUE: Multiplanar, multiecho pulse sequences of the brain and surrounding structures were obtained without intravenous contrast. COMPARISON:  CT HEAD May 14, 2015 and MRI of the head April 10, 2013 FINDINGS: INTRACRANIAL CONTENTS: No reduced diffusion to suggest acute ischemia. No susceptibility artifact to suggest hemorrhage. Old small LEFT basal  ganglia/corona radiata infarct. Old small LEFT cerebellar infarct. Punctate T2 hyperintensities bilateral basal ganglia and thalami seen with chronic small vessel ischemic disease. The ventricles and sulci are normal for patient's age. Patchy supratentorial pontine white matter FLAIR T2 hyperintensities. No suspicious parenchymal signal, masses, mass effect. No abnormal extra-axial fluid collections. No extra-axial masses. VASCULAR: Normal major intracranial vascular flow voids present at skull base. SKULL AND UPPER CERVICAL SPINE: No abnormal sellar expansion. Heterogeneous calvarial bone marrow signal. Craniocervical junction maintained. SINUSES/ORBITS: Trace paranasal sinus mucosal thickening. Mastoid air cells are well aerated.The included ocular globes and orbital contents are non-suspicious. Status post bilateral ocular lens implants. OTHER: None. IMPRESSION: 1. No acute intracranial process. 2. Old small LEFT basal ganglia/corona radiata and LEFT cerebellar infarcts. 3. Mild-to-moderate chronic small vessel ischemic disease. Electronically Signed   By: Awilda Metro M.D.   On: 07/17/2017 20:17    Assessment/Plan 1. Syncope, unspecified syncope type -felt to be due to orthostatic hypotension so coreg was reduced, hctz stopped and compression hose started -she is doing fine -ok to travel as she feels perfectly fine and did leading up to the event -important to note that she is very clear about her DNR status -no driving x 6 mos at least  2. Orthostatic hypotension - agree with med changes, will need to have some mild permissive hypertension to prevent this and drink plenty of water - Ambulatory referral to Home Health  3. Lichen sclerosus of female genitalia -has appt tomorrow with gyn due to concerning severe erythema and tenderness of the area also  4. Hypertension, renal disease, stage 1-4 or unspecified chronic kidney disease -bp borderline, but when lower, she gets orthostatic so  cont regimen as in #1  5. Balance problem - will order home health PT for when she returns from [facility] - Ambulatory referral to Home Health  6. History of stroke - cause of balance problems, see#5 - Ambulatory referral  to Home Health  Labs/tests ordered:   Orders Placed This Encounter  Procedures  . Ambulatory referral to Home Health    Referral Priority:   Routine    Referral Type:   Home Health Care    Referral Reason:   Specialty Services Required    Requested Specialty:   Home Health Services    Number of Visits Requested:   1    Next appt:  12/03/2017  Julieann Drummonds L. Taiten Brawn, D.O. Geriatrics [facility] [address], [place] [address] Cell Phone (Mon-Fri 8am-5pm):  [phone] On Call:  [phone] & follow prompts after 5pm & weekends Office Phone:  [phone] Office Fax:  [phone]

## 2017-07-24 DIAGNOSIS — N762 Acute vulvitis: Secondary | ICD-10-CM | POA: Diagnosis not present

## 2017-07-25 ENCOUNTER — Other Ambulatory Visit: Payer: Self-pay | Admitting: *Deleted

## 2017-07-25 DIAGNOSIS — I129 Hypertensive chronic kidney disease with stage 1 through stage 4 chronic kidney disease, or unspecified chronic kidney disease: Secondary | ICD-10-CM

## 2017-07-25 MED ORDER — CARVEDILOL 6.25 MG PO TABS
6.2500 mg | ORAL_TABLET | Freq: Two times a day (BID) | ORAL | 1 refills | Status: DC
Start: 2017-07-25 — End: 2017-11-05

## 2017-07-25 NOTE — Telephone Encounter (Signed)
[facility] 

## 2017-08-16 ENCOUNTER — Other Ambulatory Visit: Payer: Self-pay

## 2017-08-16 DIAGNOSIS — R2689 Other abnormalities of gait and mobility: Secondary | ICD-10-CM

## 2017-08-16 DIAGNOSIS — Z8673 Personal history of transient ischemic attack (TIA), and cerebral infarction without residual deficits: Secondary | ICD-10-CM

## 2017-08-16 DIAGNOSIS — M1711 Unilateral primary osteoarthritis, right knee: Secondary | ICD-10-CM

## 2017-08-20 DIAGNOSIS — L900 Lichen sclerosus et atrophicus: Secondary | ICD-10-CM | POA: Diagnosis not present

## 2017-08-21 ENCOUNTER — Telehealth: Payer: Self-pay

## 2017-08-21 NOTE — Telephone Encounter (Signed)
Noted.  Ok if pt is notified.

## 2017-08-21 NOTE — Telephone Encounter (Signed)
Lisa [name] with [facility] called to let provider know that there will be a delayed start of care for patient. Patient has requested that PT begin this coming Friday 08/24/17.

## 2017-08-24 DIAGNOSIS — Z89429 Acquired absence of other toe(s), unspecified side: Secondary | ICD-10-CM | POA: Diagnosis not present

## 2017-08-24 DIAGNOSIS — Z9181 History of falling: Secondary | ICD-10-CM | POA: Diagnosis not present

## 2017-08-24 DIAGNOSIS — M542 Cervicalgia: Secondary | ICD-10-CM | POA: Diagnosis not present

## 2017-08-24 DIAGNOSIS — I129 Hypertensive chronic kidney disease with stage 1 through stage 4 chronic kidney disease, or unspecified chronic kidney disease: Secondary | ICD-10-CM | POA: Diagnosis not present

## 2017-08-24 DIAGNOSIS — Z8673 Personal history of transient ischemic attack (TIA), and cerebral infarction without residual deficits: Secondary | ICD-10-CM | POA: Diagnosis not present

## 2017-08-24 DIAGNOSIS — M1711 Unilateral primary osteoarthritis, right knee: Secondary | ICD-10-CM | POA: Diagnosis not present

## 2017-08-24 DIAGNOSIS — E1122 Type 2 diabetes mellitus with diabetic chronic kidney disease: Secondary | ICD-10-CM | POA: Diagnosis not present

## 2017-08-24 DIAGNOSIS — N183 Chronic kidney disease, stage 3 (moderate): Secondary | ICD-10-CM | POA: Diagnosis not present

## 2017-08-27 DIAGNOSIS — H43811 Vitreous degeneration, right eye: Secondary | ICD-10-CM | POA: Diagnosis not present

## 2017-08-27 DIAGNOSIS — M1711 Unilateral primary osteoarthritis, right knee: Secondary | ICD-10-CM | POA: Diagnosis not present

## 2017-08-27 DIAGNOSIS — Z8673 Personal history of transient ischemic attack (TIA), and cerebral infarction without residual deficits: Secondary | ICD-10-CM | POA: Diagnosis not present

## 2017-08-27 DIAGNOSIS — N183 Chronic kidney disease, stage 3 (moderate): Secondary | ICD-10-CM | POA: Diagnosis not present

## 2017-08-27 DIAGNOSIS — H34831 Tributary (branch) retinal vein occlusion, right eye, with macular edema: Secondary | ICD-10-CM | POA: Diagnosis not present

## 2017-08-27 DIAGNOSIS — E1122 Type 2 diabetes mellitus with diabetic chronic kidney disease: Secondary | ICD-10-CM | POA: Diagnosis not present

## 2017-08-27 DIAGNOSIS — Z9181 History of falling: Secondary | ICD-10-CM | POA: Diagnosis not present

## 2017-08-27 DIAGNOSIS — M542 Cervicalgia: Secondary | ICD-10-CM | POA: Diagnosis not present

## 2017-08-27 DIAGNOSIS — I129 Hypertensive chronic kidney disease with stage 1 through stage 4 chronic kidney disease, or unspecified chronic kidney disease: Secondary | ICD-10-CM | POA: Diagnosis not present

## 2017-08-27 DIAGNOSIS — H3561 Retinal hemorrhage, right eye: Secondary | ICD-10-CM | POA: Diagnosis not present

## 2017-08-27 DIAGNOSIS — H348311 Tributary (branch) retinal vein occlusion, right eye, with retinal neovascularization: Secondary | ICD-10-CM | POA: Diagnosis not present

## 2017-08-27 DIAGNOSIS — H35351 Cystoid macular degeneration, right eye: Secondary | ICD-10-CM | POA: Diagnosis not present

## 2017-08-27 DIAGNOSIS — Z89429 Acquired absence of other toe(s), unspecified side: Secondary | ICD-10-CM | POA: Diagnosis not present

## 2017-08-31 DIAGNOSIS — Z8673 Personal history of transient ischemic attack (TIA), and cerebral infarction without residual deficits: Secondary | ICD-10-CM | POA: Diagnosis not present

## 2017-08-31 DIAGNOSIS — M542 Cervicalgia: Secondary | ICD-10-CM | POA: Diagnosis not present

## 2017-08-31 DIAGNOSIS — I129 Hypertensive chronic kidney disease with stage 1 through stage 4 chronic kidney disease, or unspecified chronic kidney disease: Secondary | ICD-10-CM | POA: Diagnosis not present

## 2017-08-31 DIAGNOSIS — M1711 Unilateral primary osteoarthritis, right knee: Secondary | ICD-10-CM | POA: Diagnosis not present

## 2017-08-31 DIAGNOSIS — E1122 Type 2 diabetes mellitus with diabetic chronic kidney disease: Secondary | ICD-10-CM | POA: Diagnosis not present

## 2017-08-31 DIAGNOSIS — Z9181 History of falling: Secondary | ICD-10-CM | POA: Diagnosis not present

## 2017-08-31 DIAGNOSIS — Z89429 Acquired absence of other toe(s), unspecified side: Secondary | ICD-10-CM | POA: Diagnosis not present

## 2017-08-31 DIAGNOSIS — N183 Chronic kidney disease, stage 3 (moderate): Secondary | ICD-10-CM | POA: Diagnosis not present

## 2017-09-03 DIAGNOSIS — Z89429 Acquired absence of other toe(s), unspecified side: Secondary | ICD-10-CM | POA: Diagnosis not present

## 2017-09-03 DIAGNOSIS — Z8673 Personal history of transient ischemic attack (TIA), and cerebral infarction without residual deficits: Secondary | ICD-10-CM | POA: Diagnosis not present

## 2017-09-03 DIAGNOSIS — E1122 Type 2 diabetes mellitus with diabetic chronic kidney disease: Secondary | ICD-10-CM | POA: Diagnosis not present

## 2017-09-03 DIAGNOSIS — M1711 Unilateral primary osteoarthritis, right knee: Secondary | ICD-10-CM | POA: Diagnosis not present

## 2017-09-03 DIAGNOSIS — I129 Hypertensive chronic kidney disease with stage 1 through stage 4 chronic kidney disease, or unspecified chronic kidney disease: Secondary | ICD-10-CM | POA: Diagnosis not present

## 2017-09-03 DIAGNOSIS — Z9181 History of falling: Secondary | ICD-10-CM | POA: Diagnosis not present

## 2017-09-03 DIAGNOSIS — M542 Cervicalgia: Secondary | ICD-10-CM | POA: Diagnosis not present

## 2017-09-03 DIAGNOSIS — N183 Chronic kidney disease, stage 3 (moderate): Secondary | ICD-10-CM | POA: Diagnosis not present

## 2017-09-06 DIAGNOSIS — M542 Cervicalgia: Secondary | ICD-10-CM | POA: Diagnosis not present

## 2017-09-06 DIAGNOSIS — Z8673 Personal history of transient ischemic attack (TIA), and cerebral infarction without residual deficits: Secondary | ICD-10-CM | POA: Diagnosis not present

## 2017-09-06 DIAGNOSIS — Z89429 Acquired absence of other toe(s), unspecified side: Secondary | ICD-10-CM | POA: Diagnosis not present

## 2017-09-06 DIAGNOSIS — E1122 Type 2 diabetes mellitus with diabetic chronic kidney disease: Secondary | ICD-10-CM | POA: Diagnosis not present

## 2017-09-06 DIAGNOSIS — Z9181 History of falling: Secondary | ICD-10-CM | POA: Diagnosis not present

## 2017-09-06 DIAGNOSIS — I129 Hypertensive chronic kidney disease with stage 1 through stage 4 chronic kidney disease, or unspecified chronic kidney disease: Secondary | ICD-10-CM | POA: Diagnosis not present

## 2017-09-06 DIAGNOSIS — M1711 Unilateral primary osteoarthritis, right knee: Secondary | ICD-10-CM | POA: Diagnosis not present

## 2017-09-06 DIAGNOSIS — N183 Chronic kidney disease, stage 3 (moderate): Secondary | ICD-10-CM | POA: Diagnosis not present

## 2017-09-11 DIAGNOSIS — E1122 Type 2 diabetes mellitus with diabetic chronic kidney disease: Secondary | ICD-10-CM | POA: Diagnosis not present

## 2017-09-11 DIAGNOSIS — Z8673 Personal history of transient ischemic attack (TIA), and cerebral infarction without residual deficits: Secondary | ICD-10-CM | POA: Diagnosis not present

## 2017-09-11 DIAGNOSIS — M542 Cervicalgia: Secondary | ICD-10-CM | POA: Diagnosis not present

## 2017-09-11 DIAGNOSIS — Z9181 History of falling: Secondary | ICD-10-CM | POA: Diagnosis not present

## 2017-09-11 DIAGNOSIS — N183 Chronic kidney disease, stage 3 (moderate): Secondary | ICD-10-CM | POA: Diagnosis not present

## 2017-09-11 DIAGNOSIS — Z89429 Acquired absence of other toe(s), unspecified side: Secondary | ICD-10-CM | POA: Diagnosis not present

## 2017-09-11 DIAGNOSIS — M1711 Unilateral primary osteoarthritis, right knee: Secondary | ICD-10-CM | POA: Diagnosis not present

## 2017-09-11 DIAGNOSIS — I129 Hypertensive chronic kidney disease with stage 1 through stage 4 chronic kidney disease, or unspecified chronic kidney disease: Secondary | ICD-10-CM | POA: Diagnosis not present

## 2017-09-13 DIAGNOSIS — E1122 Type 2 diabetes mellitus with diabetic chronic kidney disease: Secondary | ICD-10-CM | POA: Diagnosis not present

## 2017-09-13 DIAGNOSIS — M1711 Unilateral primary osteoarthritis, right knee: Secondary | ICD-10-CM | POA: Diagnosis not present

## 2017-09-13 DIAGNOSIS — N183 Chronic kidney disease, stage 3 (moderate): Secondary | ICD-10-CM | POA: Diagnosis not present

## 2017-09-13 DIAGNOSIS — M542 Cervicalgia: Secondary | ICD-10-CM | POA: Diagnosis not present

## 2017-09-13 DIAGNOSIS — Z89429 Acquired absence of other toe(s), unspecified side: Secondary | ICD-10-CM | POA: Diagnosis not present

## 2017-09-13 DIAGNOSIS — Z8673 Personal history of transient ischemic attack (TIA), and cerebral infarction without residual deficits: Secondary | ICD-10-CM | POA: Diagnosis not present

## 2017-09-13 DIAGNOSIS — Z9181 History of falling: Secondary | ICD-10-CM | POA: Diagnosis not present

## 2017-09-13 DIAGNOSIS — I129 Hypertensive chronic kidney disease with stage 1 through stage 4 chronic kidney disease, or unspecified chronic kidney disease: Secondary | ICD-10-CM | POA: Diagnosis not present

## 2017-09-17 DIAGNOSIS — Z89429 Acquired absence of other toe(s), unspecified side: Secondary | ICD-10-CM | POA: Diagnosis not present

## 2017-09-17 DIAGNOSIS — E1122 Type 2 diabetes mellitus with diabetic chronic kidney disease: Secondary | ICD-10-CM | POA: Diagnosis not present

## 2017-09-17 DIAGNOSIS — M1711 Unilateral primary osteoarthritis, right knee: Secondary | ICD-10-CM | POA: Diagnosis not present

## 2017-09-17 DIAGNOSIS — M542 Cervicalgia: Secondary | ICD-10-CM | POA: Diagnosis not present

## 2017-09-17 DIAGNOSIS — Z8673 Personal history of transient ischemic attack (TIA), and cerebral infarction without residual deficits: Secondary | ICD-10-CM | POA: Diagnosis not present

## 2017-09-17 DIAGNOSIS — Z9181 History of falling: Secondary | ICD-10-CM | POA: Diagnosis not present

## 2017-09-17 DIAGNOSIS — N183 Chronic kidney disease, stage 3 (moderate): Secondary | ICD-10-CM | POA: Diagnosis not present

## 2017-09-17 DIAGNOSIS — I129 Hypertensive chronic kidney disease with stage 1 through stage 4 chronic kidney disease, or unspecified chronic kidney disease: Secondary | ICD-10-CM | POA: Diagnosis not present

## 2017-09-21 DIAGNOSIS — Z9181 History of falling: Secondary | ICD-10-CM | POA: Diagnosis not present

## 2017-09-21 DIAGNOSIS — M1711 Unilateral primary osteoarthritis, right knee: Secondary | ICD-10-CM | POA: Diagnosis not present

## 2017-09-21 DIAGNOSIS — Z8673 Personal history of transient ischemic attack (TIA), and cerebral infarction without residual deficits: Secondary | ICD-10-CM | POA: Diagnosis not present

## 2017-09-21 DIAGNOSIS — I129 Hypertensive chronic kidney disease with stage 1 through stage 4 chronic kidney disease, or unspecified chronic kidney disease: Secondary | ICD-10-CM | POA: Diagnosis not present

## 2017-09-21 DIAGNOSIS — N183 Chronic kidney disease, stage 3 (moderate): Secondary | ICD-10-CM | POA: Diagnosis not present

## 2017-09-21 DIAGNOSIS — M542 Cervicalgia: Secondary | ICD-10-CM | POA: Diagnosis not present

## 2017-09-21 DIAGNOSIS — E1122 Type 2 diabetes mellitus with diabetic chronic kidney disease: Secondary | ICD-10-CM | POA: Diagnosis not present

## 2017-09-21 DIAGNOSIS — Z89429 Acquired absence of other toe(s), unspecified side: Secondary | ICD-10-CM | POA: Diagnosis not present

## 2017-09-25 DIAGNOSIS — M1711 Unilateral primary osteoarthritis, right knee: Secondary | ICD-10-CM | POA: Diagnosis not present

## 2017-09-25 DIAGNOSIS — Z9181 History of falling: Secondary | ICD-10-CM | POA: Diagnosis not present

## 2017-09-25 DIAGNOSIS — Z8673 Personal history of transient ischemic attack (TIA), and cerebral infarction without residual deficits: Secondary | ICD-10-CM | POA: Diagnosis not present

## 2017-09-25 DIAGNOSIS — M542 Cervicalgia: Secondary | ICD-10-CM | POA: Diagnosis not present

## 2017-09-25 DIAGNOSIS — I129 Hypertensive chronic kidney disease with stage 1 through stage 4 chronic kidney disease, or unspecified chronic kidney disease: Secondary | ICD-10-CM | POA: Diagnosis not present

## 2017-09-25 DIAGNOSIS — E1122 Type 2 diabetes mellitus with diabetic chronic kidney disease: Secondary | ICD-10-CM | POA: Diagnosis not present

## 2017-09-25 DIAGNOSIS — N183 Chronic kidney disease, stage 3 (moderate): Secondary | ICD-10-CM | POA: Diagnosis not present

## 2017-09-25 DIAGNOSIS — Z89429 Acquired absence of other toe(s), unspecified side: Secondary | ICD-10-CM | POA: Diagnosis not present

## 2017-10-02 DIAGNOSIS — Z9181 History of falling: Secondary | ICD-10-CM | POA: Diagnosis not present

## 2017-10-02 DIAGNOSIS — E1122 Type 2 diabetes mellitus with diabetic chronic kidney disease: Secondary | ICD-10-CM | POA: Diagnosis not present

## 2017-10-02 DIAGNOSIS — N183 Chronic kidney disease, stage 3 (moderate): Secondary | ICD-10-CM | POA: Diagnosis not present

## 2017-10-02 DIAGNOSIS — Z89429 Acquired absence of other toe(s), unspecified side: Secondary | ICD-10-CM | POA: Diagnosis not present

## 2017-10-02 DIAGNOSIS — I129 Hypertensive chronic kidney disease with stage 1 through stage 4 chronic kidney disease, or unspecified chronic kidney disease: Secondary | ICD-10-CM | POA: Diagnosis not present

## 2017-10-02 DIAGNOSIS — Z8673 Personal history of transient ischemic attack (TIA), and cerebral infarction without residual deficits: Secondary | ICD-10-CM | POA: Diagnosis not present

## 2017-10-02 DIAGNOSIS — M542 Cervicalgia: Secondary | ICD-10-CM | POA: Diagnosis not present

## 2017-10-02 DIAGNOSIS — M1711 Unilateral primary osteoarthritis, right knee: Secondary | ICD-10-CM | POA: Diagnosis not present

## 2017-10-08 DIAGNOSIS — H353111 Nonexudative age-related macular degeneration, right eye, early dry stage: Secondary | ICD-10-CM | POA: Diagnosis not present

## 2017-10-08 DIAGNOSIS — H3561 Retinal hemorrhage, right eye: Secondary | ICD-10-CM | POA: Diagnosis not present

## 2017-10-08 DIAGNOSIS — H35351 Cystoid macular degeneration, right eye: Secondary | ICD-10-CM | POA: Diagnosis not present

## 2017-10-08 DIAGNOSIS — H34831 Tributary (branch) retinal vein occlusion, right eye, with macular edema: Secondary | ICD-10-CM | POA: Diagnosis not present

## 2017-10-08 DIAGNOSIS — H35033 Hypertensive retinopathy, bilateral: Secondary | ICD-10-CM | POA: Diagnosis not present

## 2017-11-05 ENCOUNTER — Other Ambulatory Visit: Payer: Self-pay | Admitting: Internal Medicine

## 2017-11-05 DIAGNOSIS — E782 Mixed hyperlipidemia: Secondary | ICD-10-CM

## 2017-11-05 DIAGNOSIS — I129 Hypertensive chronic kidney disease with stage 1 through stage 4 chronic kidney disease, or unspecified chronic kidney disease: Secondary | ICD-10-CM

## 2017-11-05 DIAGNOSIS — G4700 Insomnia, unspecified: Secondary | ICD-10-CM

## 2017-11-12 DIAGNOSIS — H34831 Tributary (branch) retinal vein occlusion, right eye, with macular edema: Secondary | ICD-10-CM | POA: Diagnosis not present

## 2017-11-12 DIAGNOSIS — H3561 Retinal hemorrhage, right eye: Secondary | ICD-10-CM | POA: Diagnosis not present

## 2017-11-12 DIAGNOSIS — H35033 Hypertensive retinopathy, bilateral: Secondary | ICD-10-CM | POA: Diagnosis not present

## 2017-11-12 DIAGNOSIS — H35351 Cystoid macular degeneration, right eye: Secondary | ICD-10-CM | POA: Diagnosis not present

## 2017-12-03 ENCOUNTER — Encounter: Payer: Self-pay | Admitting: Internal Medicine

## 2017-12-03 ENCOUNTER — Ambulatory Visit (INDEPENDENT_AMBULATORY_CARE_PROVIDER_SITE_OTHER): Payer: Medicare Other | Admitting: Internal Medicine

## 2017-12-03 VITALS — BP 140/80 | HR 70 | Temp 97.7°F | Ht 67.0 in | Wt 153.0 lb

## 2017-12-03 DIAGNOSIS — M1711 Unilateral primary osteoarthritis, right knee: Secondary | ICD-10-CM | POA: Diagnosis not present

## 2017-12-03 DIAGNOSIS — R2689 Other abnormalities of gait and mobility: Secondary | ICD-10-CM | POA: Diagnosis not present

## 2017-12-03 DIAGNOSIS — D649 Anemia, unspecified: Secondary | ICD-10-CM | POA: Insufficient documentation

## 2017-12-03 DIAGNOSIS — H34831 Tributary (branch) retinal vein occlusion, right eye, with macular edema: Secondary | ICD-10-CM | POA: Diagnosis not present

## 2017-12-03 DIAGNOSIS — N904 Leukoplakia of vulva: Secondary | ICD-10-CM | POA: Insufficient documentation

## 2017-12-03 DIAGNOSIS — E538 Deficiency of other specified B group vitamins: Secondary | ICD-10-CM | POA: Diagnosis not present

## 2017-12-03 DIAGNOSIS — I129 Hypertensive chronic kidney disease with stage 1 through stage 4 chronic kidney disease, or unspecified chronic kidney disease: Secondary | ICD-10-CM

## 2017-12-03 DIAGNOSIS — R5383 Other fatigue: Secondary | ICD-10-CM | POA: Diagnosis not present

## 2017-12-03 DIAGNOSIS — Z23 Encounter for immunization: Secondary | ICD-10-CM

## 2017-12-03 NOTE — Progress Notes (Signed)
Location:  [facility] clinic Provider:  Patric Buckhalter L. Renato Gails, D.O., C.M.D.  Code Status: DNR Goals of Care:  Advanced Directives 07/23/2017  Does Patient Have a Medical Advance Directive? Yes  Type of Estate agent of [place];Living will;Out of facility DNR (pink MOST or yellow form)  Does patient want to make changes to medical advance directive? No - Patient declined  Copy of Healthcare Power of Attorney in Chart? Yes  Would patient like information on creating a medical advance directive? -  Pre-existing out of facility DNR order (yellow form or pink MOST form) Yellow form placed in chart (order not valid for inpatient use)     Chief Complaint  Patient presents with  . Medical Management of Chronic Issues    follow-up    HPI: Patient is a 84 y.o. female seen today for medical management of chronic diseases.    Last visit with Dr. Marcelle Overlie was 08/20/17.   She applied a cream which "cured" her vulvar dysplasia from lichen sclerosis.  No more bleeding down there or problems at all at this point.  She refused the surgical option.    Had her home health therapy.  Reports balance is much better.    She did very well when in [place] seeing her grandson Buyer, retail.  He is going to bring her grandbaby, the first boy at Christmas.    She is getting weaker and weaker especially early in the morning.  Gets a little better with time.  Denies aches or pains.  Rarely uses her tramadol.  Only when she hurts.  That's more than it used to be--right knee and back of neck.    Takes B complex, but may be straight B12.    Weight is down 2 lbs, but has been in 150s for a year and 1/2.    Can't sleep w/o her remeron.  Had been down due to one son's lung cancer.  Spirits doing ok now--morale much better with knowing she'll see her grandbaby at Christmas  No difficulty with allergies this year--has not had to use flonase.    Past Medical History:  Diagnosis Date  . Cervicalgia   . Chronic  kidney disease, stage III (moderate) (HCC)   . Dermatophytosis of groin and perianal area   . Hyperlipidemia LDL goal < 100   . Insomnia, unspecified   . Muscle weakness (generalized)   . Osteoarthrosis, unspecified whether generalized or localized, lower leg    osteoarthritis - upper thoracic area, left knee.  . Other abnormal glucose   . Other B-complex deficiencies   . Other malaise and fatigue   . Palpitations   . Senile cataract, unspecified   . Shortness of breath   . Stroke [facility])    small "stroke" - right sided weakness for a day.  . Tachycardia, unspecified   . Type II or unspecified type diabetes mellitus with renal manifestations, not stated as uncontrolled(250.40)    "prediabetes"-no meds.  . Undiagnosed cardiac murmurs   . Unspecified essential hypertension   . Unspecified vitamin D deficiency     Past Surgical History:  Procedure Laterality Date  . CATARACT EXTRACTION, BILATERAL Bilateral   . EYE SURGERY     cataract  . TOE AMPUTATION  2002  . TONSILLECTOMY  1944  . TONSILLECTOMY     younger yrs  . TOTAL KNEE ARTHROPLASTY Left 01/15/2015   Procedure: LEFT TOTAL KNEE ARTHROPLASTY;  Surgeon: Eugenia Mcalpine, MD;  Location: [facility];  Service: Orthopedics;  Laterality: Left;  .  TUBAL LIGATION  1951    Allergies  Allergen Reactions  . Ace Inhibitors Cough  . Lisinopril Cough  . Simvastatin Other (See Comments)    Body aches and pains    Outpatient Encounter Medications as of 12/03/2017  Medication Sig  . b complex vitamins tablet Take 1 tablet by mouth daily.  . carvedilol (COREG) 6.25 MG tablet TAKE 1 TABLET BY MOUTH TWO  TIMES DAILY WITH A MEAL  . clopidogrel (PLAVIX) 75 MG tablet TAKE 1 TABLET BY MOUTH  DAILY WITH BREAKFAST  . Ergocalciferol (VITAMIN D2) 400 UNITS TABS Take 1 tablet by mouth daily.   . fluticasone (FLONASE) 50 MCG/ACT nasal spray Place 2 sprays into both nostrils daily as needed for allergies or rhinitis.   . mirtazapine (REMERON) 7.5 MG  tablet TAKE 1 TABLET BY MOUTH AT  BEDTIME  . Multiple Vitamins-Minerals (PRESERVISION AREDS 2+MULTI VIT PO) Take 2 capsules by mouth daily.  . traMADol (ULTRAM) 50 MG tablet Take 1 tablet (50 mg total) by mouth 2 (two) times daily as needed.   No facility-administered encounter medications on file as of 12/03/2017.     Review of Systems:  Review of Systems  Constitutional: Positive for malaise/fatigue and weight loss. Negative for chills and fever.  HENT: Positive for hearing loss. Negative for congestion.   Eyes: Positive for blurred vision.  Respiratory: Negative for cough and shortness of breath.   Cardiovascular: Negative for chest pain, palpitations and leg swelling.  Gastrointestinal: Negative for abdominal pain, blood in stool, constipation and melena.  Genitourinary: Negative for dysuria, frequency, hematuria and urgency.  Musculoskeletal: Positive for joint pain. Negative for falls.       Uses cane  Neurological: Negative for dizziness and loss of consciousness.  Endo/Heme/Allergies: Bruises/bleeds easily.  Psychiatric/Behavioral: Negative for depression and memory loss. The patient is not nervous/anxious and does not have insomnia.     Health Maintenance  Topic Date Due  . OPHTHALMOLOGY EXAM  11/09/2015  . URINE MICROALBUMIN  07/04/2016  . HEMOGLOBIN A1C  10/15/2016  . FOOT EXAM  04/27/2017  . INFLUENZA VACCINE  10/18/2017  . TETANUS/TDAP  05/21/2021  . PNA vac Low Risk Adult  Completed  . DEXA SCAN  Addressed    Physical Exam: Vitals:   12/03/17 0832  BP: 140/80  Pulse: 70  Temp: 97.7 F (36.5 C)  TempSrc: Oral  SpO2: 97%  Weight: 153 lb (69.4 kg)  Height: 5\' 7"  (1.702 m)   Body mass index is 23.96 kg/m. Physical Exam  Constitutional: She is oriented to person, place, and time. No distress.  Increased frailty  HENT:  Head: Normocephalic and atraumatic.  Cardiovascular: Normal rate, regular rhythm, normal heart sounds and intact distal pulses.    Pulmonary/Chest: Effort normal and breath sounds normal. No respiratory distress.  Abdominal: Bowel sounds are normal. She exhibits no distension.  Musculoskeletal: Normal range of motion.  Steadier now with cane (post PT)  Neurological: She is alert and oriented to person, place, and time. No cranial nerve deficit.  Skin: Skin is warm and dry. Capillary refill takes less than 2 seconds.  Psychiatric: She has a normal mood and affect.    Labs reviewed: Basic Metabolic Panel: Recent Labs    07/17/17 1355 07/17/17 1900 07/18/17 0520  NA 139  --  141  K 4.3  --  3.6  CL 109  --  105  CO2 24  --  25  GLUCOSE 193*  --  108*  BUN 30*  --  23*  CREATININE 1.16* 0.92 0.84  CALCIUM 8.5*  --  8.9  MG  --  2.1  --   TSH  --  1.486  --    Liver Function Tests: No results for input(s): AST, ALT, ALKPHOS, BILITOT, PROT, ALBUMIN in the last 8760 hours. No results for input(s): LIPASE, AMYLASE in the last 8760 hours. No results for input(s): AMMONIA in the last 8760 hours. CBC: Recent Labs    03/01/17 1536 07/17/17 1355 07/17/17 1900 07/18/17 0520  WBC 6.6 7.3 7.1 7.8  NEUTROABS 4,732  --   --   --   HGB 12.6 11.5* 11.9* 11.6*  HCT 38.0 36.0 37.5 35.9*  MCV 86.2 90.7 90.4 89.8  PLT 214 175 178 176   Lipid Panel: No results for input(s): CHOL, HDL, LDLCALC, TRIG, CHOLHDL, LDLDIRECT in the last 8760 hours. Lab Results  Component Value Date   HGBA1C 5.6 04/17/2016    Assessment/Plan 1. Primary osteoarthritis of right knee -cont rare tramadol use for severe pain on bad days (also for her cervical OA)  2. Need for influenza vaccination - Flu vaccine HIGH DOSE PF (Fluzone High dose) given  3. Lichen sclerosus of female genitalia - pt reports resolution with use of steroid cream--had vin 2-3 vulvar dysplasia and refused surgical intervention due to her age and increasing frailty - CBC with Differential/Platelet - COMPLETE METABOLIC PANEL WITH GFR  4. Balance  problem -improved with PT at home -cont use of cane  5. Hypertension, renal disease, stage 1-4 or unspecified chronic kidney disease -bp at goal for her age and h/o orthostatic hypotension and syncope when on more medication for bp  6. B12 deficiency - has been taking a b12 or B complex vitamin for years (she's not sure which it is) - check level due to increased fatigue and weakness -Vitamin B12  7. Anemia, unspecified type - was notable on her labs when hospitalized -may be related to her dysplasia of her vulvar region vs chronic disease at her age vs B12 deficiency if supplement did not contain adequate b12, will also r/o iron deficiency - CBC with Differential/Platelet - Vitamin B12 - Iron, TIBC and Ferritin Panel  8. Other fatigue And generalized weakness -biggest concerns she has at this point  Labs/tests ordered:   Orders Placed This Encounter  Procedures  . Flu vaccine HIGH DOSE PF (Fluzone High dose)  . CBC with Differential/Platelet  . COMPLETE METABOLIC PANEL WITH GFR  . Vitamin B12  . Iron, TIBC and Ferritin Panel   Next appt:  4 mos med mgt  Nakhia Levitan L. Ervan Heber, D.O. Geriatrics [facility] [address] [place], [place] [address] Cell Phone (Mon-Fri 8am-5pm):  [phone] On Call:  [phone] & follow prompts after 5pm & weekends Office Phone:  [phone] Office Fax:  [phone]

## 2017-12-04 LAB — COMPLETE METABOLIC PANEL WITH GFR
AG Ratio: 2 (calc) (ref 1.0–2.5)
ALT: 9 U/L (ref 6–29)
AST: 11 U/L (ref 10–35)
Albumin: 4.3 g/dL (ref 3.6–5.1)
Alkaline phosphatase (APISO): 60 U/L (ref 33–130)
BUN/Creatinine Ratio: 34 (calc) — ABNORMAL HIGH (ref 6–22)
BUN: 36 mg/dL — ABNORMAL HIGH (ref 7–25)
CO2: 28 mmol/L (ref 20–32)
Calcium: 9.6 mg/dL (ref 8.6–10.4)
Chloride: 108 mmol/L (ref 98–110)
Creat: 1.07 mg/dL — ABNORMAL HIGH (ref 0.60–0.88)
GFR, Est African American: 51 mL/min/{1.73_m2} — ABNORMAL LOW (ref >=60)
GFR, Est Non African American: 44 mL/min/{1.73_m2} — ABNORMAL LOW (ref >=60)
Globulin: 2.2 g/dL (calc) (ref 1.9–3.7)
Glucose, Bld: 93 mg/dL (ref 65–139)
Potassium: 4.3 mmol/L (ref 3.5–5.3)
Sodium: 145 mmol/L (ref 135–146)
Total Bilirubin: 0.5 mg/dL (ref 0.2–1.2)
Total Protein: 6.5 g/dL (ref 6.1–8.1)

## 2017-12-04 LAB — IRON,TIBC AND FERRITIN PANEL
%SAT: 26 % (calc) (ref 16–45)
Ferritin: 103 ng/mL (ref 16–288)
Iron: 63 ug/dL (ref 45–160)
TIBC: 243 mcg/dL (calc) — ABNORMAL LOW (ref 250–450)

## 2017-12-04 LAB — CBC WITH DIFFERENTIAL/PLATELET
Basophils Absolute: 39 cells/uL (ref 0–200)
Basophils Relative: 0.5 %
Eosinophils Absolute: 226 cells/uL (ref 15–500)
Eosinophils Relative: 2.9 %
HCT: 37.6 % (ref 35.0–45.0)
Hemoglobin: 12.7 g/dL (ref 11.7–15.5)
Lymphs Abs: 1357 cells/uL (ref 850–3900)
MCH: 29.8 pg (ref 27.0–33.0)
MCHC: 33.8 g/dL (ref 32.0–36.0)
MCV: 88.3 fL (ref 80.0–100.0)
MPV: 10.4 fL (ref 7.5–12.5)
Monocytes Relative: 5.1 %
Neutro Abs: 5780 cells/uL (ref 1500–7800)
Neutrophils Relative %: 74.1 %
Platelets: 209 10*3/uL (ref 140–400)
RBC: 4.26 10*6/uL (ref 3.80–5.10)
RDW: 13 % (ref 11.0–15.0)
Total Lymphocyte: 17.4 %
WBC mixed population: 398 cells/uL (ref 200–950)
WBC: 7.8 10*3/uL (ref 3.8–10.8)

## 2017-12-04 LAB — VITAMIN B12: Vitamin B-12: 830 pg/mL (ref 200–1100)

## 2017-12-06 ENCOUNTER — Encounter: Payer: Self-pay | Admitting: *Deleted

## 2018-01-07 DIAGNOSIS — H43811 Vitreous degeneration, right eye: Secondary | ICD-10-CM | POA: Diagnosis not present

## 2018-01-07 DIAGNOSIS — H35351 Cystoid macular degeneration, right eye: Secondary | ICD-10-CM | POA: Diagnosis not present

## 2018-01-07 DIAGNOSIS — H34831 Tributary (branch) retinal vein occlusion, right eye, with macular edema: Secondary | ICD-10-CM | POA: Diagnosis not present

## 2018-01-14 ENCOUNTER — Other Ambulatory Visit: Payer: Self-pay | Admitting: Internal Medicine

## 2018-01-14 DIAGNOSIS — M1711 Unilateral primary osteoarthritis, right knee: Secondary | ICD-10-CM

## 2018-01-14 NOTE — Telephone Encounter (Signed)
Last filled 09/18/2017 Database checked and verified Patient wants this sent to [facility]

## 2018-01-23 DIAGNOSIS — H353131 Nonexudative age-related macular degeneration, bilateral, early dry stage: Secondary | ICD-10-CM | POA: Diagnosis not present

## 2018-01-23 DIAGNOSIS — H04123 Dry eye syndrome of bilateral lacrimal glands: Secondary | ICD-10-CM | POA: Diagnosis not present

## 2018-01-23 DIAGNOSIS — Z961 Presence of intraocular lens: Secondary | ICD-10-CM | POA: Diagnosis not present

## 2018-01-23 DIAGNOSIS — H348312 Tributary (branch) retinal vein occlusion, right eye, stable: Secondary | ICD-10-CM | POA: Diagnosis not present

## 2018-02-11 DIAGNOSIS — H35351 Cystoid macular degeneration, right eye: Secondary | ICD-10-CM | POA: Diagnosis not present

## 2018-02-11 DIAGNOSIS — H43811 Vitreous degeneration, right eye: Secondary | ICD-10-CM | POA: Diagnosis not present

## 2018-02-11 DIAGNOSIS — H34831 Tributary (branch) retinal vein occlusion, right eye, with macular edema: Secondary | ICD-10-CM | POA: Diagnosis not present

## 2018-02-28 DIAGNOSIS — I10 Essential (primary) hypertension: Secondary | ICD-10-CM | POA: Diagnosis not present

## 2018-02-28 DIAGNOSIS — N390 Urinary tract infection, site not specified: Secondary | ICD-10-CM | POA: Diagnosis not present

## 2018-02-28 DIAGNOSIS — R011 Cardiac murmur, unspecified: Secondary | ICD-10-CM | POA: Diagnosis not present

## 2018-04-08 ENCOUNTER — Ambulatory Visit: Payer: Medicare Other | Admitting: Internal Medicine

## 2018-04-08 DIAGNOSIS — H3561 Retinal hemorrhage, right eye: Secondary | ICD-10-CM | POA: Diagnosis not present

## 2018-04-08 DIAGNOSIS — H43811 Vitreous degeneration, right eye: Secondary | ICD-10-CM | POA: Diagnosis not present

## 2018-04-08 DIAGNOSIS — H34831 Tributary (branch) retinal vein occlusion, right eye, with macular edema: Secondary | ICD-10-CM | POA: Diagnosis not present

## 2018-04-08 DIAGNOSIS — H35351 Cystoid macular degeneration, right eye: Secondary | ICD-10-CM | POA: Diagnosis not present

## 2018-04-15 ENCOUNTER — Ambulatory Visit
Admission: RE | Admit: 2018-04-15 | Discharge: 2018-04-15 | Disposition: A | Discharge status: Home / Self Care | Payer: Medicare Other | Source: Ambulatory Visit | Attending: Internal Medicine | Admitting: Internal Medicine

## 2018-04-15 ENCOUNTER — Encounter: Payer: Self-pay | Admitting: Internal Medicine

## 2018-04-15 ENCOUNTER — Ambulatory Visit (INDEPENDENT_AMBULATORY_CARE_PROVIDER_SITE_OTHER): Payer: Medicare Other | Admitting: Internal Medicine

## 2018-04-15 VITALS — BP 180/82 | HR 62 | Temp 97.9°F | Ht 67.0 in | Wt 150.0 lb

## 2018-04-15 DIAGNOSIS — R0989 Other specified symptoms and signs involving the circulatory and respiratory systems: Secondary | ICD-10-CM | POA: Diagnosis not present

## 2018-04-15 DIAGNOSIS — J069 Acute upper respiratory infection, unspecified: Secondary | ICD-10-CM

## 2018-04-15 DIAGNOSIS — E1122 Type 2 diabetes mellitus with diabetic chronic kidney disease: Secondary | ICD-10-CM

## 2018-04-15 DIAGNOSIS — R5383 Other fatigue: Secondary | ICD-10-CM

## 2018-04-15 DIAGNOSIS — N183 Chronic kidney disease, stage 3 unspecified: Secondary | ICD-10-CM

## 2018-04-15 DIAGNOSIS — I129 Hypertensive chronic kidney disease with stage 1 through stage 4 chronic kidney disease, or unspecified chronic kidney disease: Secondary | ICD-10-CM | POA: Diagnosis not present

## 2018-04-15 DIAGNOSIS — R05 Cough: Secondary | ICD-10-CM | POA: Diagnosis not present

## 2018-04-15 NOTE — Progress Notes (Signed)
[facility]  [facility] Provider:  Johathan Province L. Renato Gailseed, D.O., C.M.D.  Code Status: DNR Goals of Care:  Advanced Directives 07/23/2017  Does Patient Have a Medical Advance Directive? Yes  Type of Estate agentAdvance Directive Healthcare Power of [place];Living will;Out of facility DNR (pink MOST or yellow form)  Does patient want to make changes to medical advance directive? No - Patient declined  Copy of Healthcare Power of Attorney in Chart? Yes  Would patient like information on creating a medical advance directive? -  Pre-existing out of facility DNR order (yellow form or pink MOST form) Yellow form placed in chart (order not valid for inpatient use)   Chief Complaint  Patient presents with  . Medical Management of Chronic Issues    4mth follow-up, cough and chest congestion    HPI: Patient is a 85 y.o. female seen today for medical management of chronic diseases and acute visit for cough and chest congestion.  Began Thursday.  No temp.  Yellow mucus.  Not green yet.  No headaches or myalgias.  Has chronic neck OA.  Weak as can be.  She's eating soup.  Drinking fluids.  She has not tried to do anything to test for dyspnea on exertion.  Does not have the energy.    She lost her youngest son, Jillyn HiddenGary, 03/22/2018,  from lung cancer.  Then another lady family member died.  She did get to see her great grandbabies over the holidays.  She's still doing what she has to do one day at a time.    Therapy for balance helped a lot in the summer.  Using her cane.    She's been sitting in her recliner.  She does walk around a little--goes to the bathroom and fixes meals, answers the door.    No further issues down there outside of one UTI where she got abx for 3 days and it resolved.  It was on a weekend.    BP was high coming in today.  She didn't eat anything so didn't take her meds.  Past Medical History:  Diagnosis Date  . Cervicalgia   . Chronic kidney disease, stage III (moderate) (HCC)   . Dermatophytosis  of groin and perianal area   . Hyperlipidemia LDL goal < 100   . Insomnia, unspecified   . Muscle weakness (generalized)   . Osteoarthrosis, unspecified whether generalized or localized, lower leg    osteoarthritis - upper thoracic area, left knee.  . Other abnormal glucose   . Other B-complex deficiencies   . Other malaise and fatigue   . Palpitations   . Senile cataract, unspecified   . Shortness of breath   . Stroke [facility](HCC)    small "stroke" - right sided weakness for a day.  . Tachycardia, unspecified   . Type II or unspecified type diabetes mellitus with renal manifestations, not stated as uncontrolled(250.40)    "prediabetes"-no meds.  . Undiagnosed cardiac murmurs   . Unspecified essential hypertension   . Unspecified vitamin D deficiency     Past Surgical History:  Procedure Laterality Date  . CATARACT EXTRACTION, BILATERAL Bilateral   . EYE SURGERY     cataract  . TOE AMPUTATION  2002  . TONSILLECTOMY  1944  . TONSILLECTOMY     younger yrs  . TOTAL KNEE ARTHROPLASTY Left 01/15/2015   Procedure: LEFT TOTAL KNEE ARTHROPLASTY;  Surgeon: Eugenia Mcalpineobert Collins, MD;  Location: [facility];  Service: Orthopedics;  Laterality: Left;  . TUBAL LIGATION  1951  Allergies  Allergen Reactions  . Ace Inhibitors Cough  . Lisinopril Cough  . Simvastatin Other (See Comments)    Body aches and pains    Outpatient Encounter Medications as of 04/15/2018  Medication Sig  . b complex vitamins tablet Take 1 tablet by mouth daily.  . carvedilol (COREG) 6.25 MG tablet TAKE 1 TABLET BY MOUTH TWO  TIMES DAILY WITH A MEAL  . clopidogrel (PLAVIX) 75 MG tablet TAKE 1 TABLET BY MOUTH  DAILY WITH BREAKFAST  . Ergocalciferol (VITAMIN D2) 400 UNITS TABS Take 1 tablet by mouth daily.   . fluticasone (FLONASE) 50 MCG/ACT nasal spray Place 2 sprays into both nostrils daily as needed for allergies or rhinitis.   . mirtazapine (REMERON) 7.5 MG tablet TAKE 1 TABLET BY MOUTH AT  BEDTIME  . Multiple  Vitamins-Minerals (PRESERVISION AREDS 2+MULTI VIT PO) Take 2 capsules by mouth daily.  . traMADol (ULTRAM) 50 MG tablet Take 1 tablet (50 mg total) by mouth every 12 (twelve) hours as needed.  . [DISCONTINUED] traMADol (ULTRAM) 50 MG tablet Take 1 tablet (50 mg total) by mouth 2 (two) times daily as needed.   No facility-administered encounter medications on file as of 04/15/2018.     Review of Systems:  Review of Systems  Constitutional: Positive for malaise/fatigue. Negative for chills and fever.  HENT: Positive for congestion. Negative for ear pain, sinus pain and sore throat.   Eyes: Negative for blurred vision.  Respiratory: Positive for shortness of breath. Negative for cough.   Cardiovascular: Negative for chest pain, palpitations and leg swelling.  Gastrointestinal: Negative for diarrhea, heartburn, nausea and vomiting.  Genitourinary: Negative for dysuria.  Musculoskeletal: Positive for myalgias and neck pain. Negative for back pain, falls and joint pain.  Skin: Negative for itching and rash.  Neurological: Negative for loss of consciousness and headaches.  Endo/Heme/Allergies: Does not bruise/bleed easily.  Psychiatric/Behavioral: Negative for depression and memory loss. The patient is not nervous/anxious and does not have insomnia.        Grieving loss of son and other relative    Health Maintenance  Topic Date Due  . OPHTHALMOLOGY EXAM  11/09/2015  . URINE MICROALBUMIN  07/04/2016  . HEMOGLOBIN A1C  10/15/2016  . FOOT EXAM  04/27/2017  . TETANUS/TDAP  05/21/2021  . INFLUENZA VACCINE  Completed  . PNA vac Low Risk Adult  Completed  . DEXA SCAN  Addressed    Physical Exam: Vitals:   04/15/18 1156  BP: (!) 180/82  Pulse: 62  Temp: 97.9 F (36.6 C)  TempSrc: Oral  SpO2: 97%  Weight: 150 lb (68 kg)  Height: 5\' 7"  (1.702 m)   Body mass index is 23.49 kg/m. Physical Exam Constitutional:      General: She is not in acute distress.    Appearance: Normal  appearance. She is not toxic-appearing.  HENT:     Head: Normocephalic and atraumatic.     Right Ear: Tympanic membrane, ear canal and external ear normal.     Left Ear: Tympanic membrane, ear canal and external ear normal.     Nose: Congestion present. No rhinorrhea.     Mouth/Throat:     Mouth: Mucous membranes are moist.     Pharynx: Oropharynx is clear. No oropharyngeal exudate.  Eyes:     Conjunctiva/sclera: Conjunctivae normal.  Cardiovascular:     Rate and Rhythm: Normal rate and regular rhythm.     Pulses: Normal pulses.     Heart sounds: Normal heart sounds.  Pulmonary:     Effort: Pulmonary effort is normal.     Comments: Right base rhonchi Musculoskeletal: Normal range of motion.     Comments: Ambulates with 4 pronged cane  Skin:    General: Skin is warm and dry.     Capillary Refill: Capillary refill takes less than 2 seconds.     Coloration: Skin is pale.  Neurological:     General: No focal deficit present.     Mental Status: She is alert and oriented to person, place, and time.  Psychiatric:        Mood and Affect: Mood normal.        Behavior: Behavior normal.        Thought Content: Thought content normal.        Judgment: Judgment normal.     Labs reviewed: Basic Metabolic Panel: Recent Labs    07/17/17 1355 07/17/17 1900 07/18/17 0520 12/03/17 0915  NA 139  --  141 145  K 4.3  --  3.6 4.3  CL 109  --  105 108  CO2 24  --  25 28  GLUCOSE 193*  --  108* 93  BUN 30*  --  23* 36*  CREATININE 1.16* 0.92 0.84 1.07*  CALCIUM 8.5*  --  8.9 9.6  MG  --  2.1  --   --   TSH  --  1.486  --   --    Liver Function Tests: Recent Labs    12/03/17 0915  AST 11  ALT 9  BILITOT 0.5  PROT 6.5   No results for input(s): LIPASE, AMYLASE in the last 8760 hours. No results for input(s): AMMONIA in the last 8760 hours. CBC: Recent Labs    07/17/17 1900 07/18/17 0520 12/03/17 0915  WBC 7.1 7.8 7.8  NEUTROABS  --   --  5,780  HGB 11.9* 11.6* 12.7  HCT  37.5 35.9* 37.6  MCV 90.4 89.8 88.3  PLT 178 176 209   Lipid Panel: No results for input(s): CHOL, HDL, LDLCALC, TRIG, CHOLHDL, LDLDIRECT in the last 8760 hours. Lab Results  Component Value Date   HGBA1C 5.6 04/17/2016    Procedures since last visit: No results found.  Assessment/Plan 1. Rhonchi at right lung base -r/o pna - DG Chest 2 View; Future  2. URI, acute - cont conservative measures, due to #1 and malaise, decreased intake, check xray - DG Chest 2 View; Future  3. Hypertension, renal disease, stage 1-4 or unspecified chronic kidney disease -bp controlled typically with current therapy--no meds this am and has cold  4. Other fatigue -due to malaise and decreased intake plus right base rhonchi, check xray to r/o pneumonia - DG Chest 2 View; Future  5. Controlled type 2 diabetes mellitus with stage 3 chronic kidney disease, without long-term current use of insulin (HCC) -encouraged hydration today with cold  Labs/tests ordered:  @ORDERS @ Next appt:  07/05/2018   Cecilee Rosner L. Endora Teresi, D.O. Geriatrics [facility] [address], [place] [address] Cell Phone (Mon-Fri 8am-5pm):  [phone] On Call:  [phone] & follow prompts after 5pm & weekends Office Phone:  [phone] Office Fax:  [phone]

## 2018-05-20 DIAGNOSIS — H3561 Retinal hemorrhage, right eye: Secondary | ICD-10-CM | POA: Diagnosis not present

## 2018-05-20 DIAGNOSIS — H43811 Vitreous degeneration, right eye: Secondary | ICD-10-CM | POA: Diagnosis not present

## 2018-05-20 DIAGNOSIS — H35351 Cystoid macular degeneration, right eye: Secondary | ICD-10-CM | POA: Diagnosis not present

## 2018-05-20 DIAGNOSIS — H34831 Tributary (branch) retinal vein occlusion, right eye, with macular edema: Secondary | ICD-10-CM | POA: Diagnosis not present

## 2018-06-17 ENCOUNTER — Ambulatory Visit (INDEPENDENT_AMBULATORY_CARE_PROVIDER_SITE_OTHER): Payer: Medicare Other | Admitting: Internal Medicine

## 2018-06-17 ENCOUNTER — Other Ambulatory Visit: Payer: Self-pay

## 2018-06-17 ENCOUNTER — Encounter: Payer: Self-pay | Admitting: Internal Medicine

## 2018-06-17 ENCOUNTER — Other Ambulatory Visit: Payer: Self-pay | Admitting: Internal Medicine

## 2018-06-17 VITALS — BP 138/70 | HR 81 | Temp 97.7°F | Ht 67.0 in | Wt 146.0 lb

## 2018-06-17 DIAGNOSIS — G4700 Insomnia, unspecified: Secondary | ICD-10-CM

## 2018-06-17 DIAGNOSIS — E782 Mixed hyperlipidemia: Secondary | ICD-10-CM

## 2018-06-17 DIAGNOSIS — N904 Leukoplakia of vulva: Secondary | ICD-10-CM

## 2018-06-17 DIAGNOSIS — N3001 Acute cystitis with hematuria: Secondary | ICD-10-CM

## 2018-06-17 DIAGNOSIS — E1122 Type 2 diabetes mellitus with diabetic chronic kidney disease: Secondary | ICD-10-CM

## 2018-06-17 DIAGNOSIS — N183 Chronic kidney disease, stage 3 unspecified: Secondary | ICD-10-CM

## 2018-06-17 DIAGNOSIS — R300 Dysuria: Secondary | ICD-10-CM | POA: Diagnosis not present

## 2018-06-17 LAB — POCT URINALYSIS DIPSTICK
Bilirubin, UA: NEGATIVE
Glucose, UA: NEGATIVE
Ketones, UA: NEGATIVE
Nitrite, UA: NEGATIVE
Protein, UA: POSITIVE — AB
Spec Grav, UA: 1.02 (ref 1.010–1.025)
Urobilinogen, UA: 0.2 E.U./dL
pH, UA: 6 (ref 5.0–8.0)

## 2018-06-17 MED ORDER — CIPROFLOXACIN HCL 500 MG PO TABS: 500.0000 mg | ORAL_TABLET | Freq: Two times a day (BID) | ORAL | 0 refills | Status: DC

## 2018-06-17 NOTE — Progress Notes (Signed)
Location:  [facility] clinic Provider:  Rubina Basinski L. Renato Gails, D.O., C.M.D.  Code Status: DNR Goals of Care:  Advanced Directives 07/23/2017  Does Patient Have a Medical Advance Directive? Yes  Type of Estate agent of [place];Living will;Out of facility DNR (pink MOST or yellow form)  Does patient want to make changes to medical advance directive? No - Patient declined  Copy of Healthcare Power of Attorney in Chart? Yes  Would patient like information on creating a medical advance directive? -  Pre-existing out of facility DNR order (yellow form or pink MOST form) Yellow form placed in chart (order not valid for inpatient use)     Chief Complaint  Patient presents with  . Acute Visit    UTI    HPI: Patient is a 85 y.o. female seen today for an acute visit for suspected UTI.  Says this is the worst one ever.  It started a little over a week ago.  She admits it's been a long time.  She tried, azo, cranberry and water.  She's had terrible pain over the weekend.  Mostly hurts all of the time some.  It eases some.  She also has terrible dysuria.  No fever.  She says this may be up for her.  No flank pain.  Urine has been cloudy and foul-smelling.  She's going every 2 hours at night and less clear during the day.  It is urgent to go.  It also takes a while after going to feel better.  She did use the cream that her gyn gave her also.  She is eating less than usual also.  She's lost 4 lbs.  Appetite not good.  Has tried to eat something small three times per day.  Past Medical History:  Diagnosis Date  . Cervicalgia   . Chronic kidney disease, stage III (moderate) (HCC)   . Dermatophytosis of groin and perianal area   . Hyperlipidemia LDL goal < 100   . Insomnia, unspecified   . Muscle weakness (generalized)   . Osteoarthrosis, unspecified whether generalized or localized, lower leg    osteoarthritis - upper thoracic area, left knee.  . Other abnormal glucose   . Other  B-complex deficiencies   . Other malaise and fatigue   . Palpitations   . Senile cataract, unspecified   . Shortness of breath   . Stroke [facility])    small "stroke" - right sided weakness for a day.  . Tachycardia, unspecified   . Type II or unspecified type diabetes mellitus with renal manifestations, not stated as uncontrolled(250.40)    "prediabetes"-no meds.  . Undiagnosed cardiac murmurs   . Unspecified essential hypertension   . Unspecified vitamin D deficiency     Past Surgical History:  Procedure Laterality Date  . CATARACT EXTRACTION, BILATERAL Bilateral   . EYE SURGERY     cataract  . TOE AMPUTATION  2002  . TONSILLECTOMY  1944  . TONSILLECTOMY     younger yrs  . TOTAL KNEE ARTHROPLASTY Left 01/15/2015   Procedure: LEFT TOTAL KNEE ARTHROPLASTY;  Surgeon: Eugenia Mcalpine, MD;  Location: [facility];  Service: Orthopedics;  Laterality: Left;  . TUBAL LIGATION  1951    Allergies  Allergen Reactions  . Ace Inhibitors Cough  . Lisinopril Cough  . Simvastatin Other (See Comments)    Body aches and pains    Outpatient Encounter Medications as of 06/17/2018  Medication Sig  . b complex vitamins tablet Take 1 tablet by mouth daily.  Marland Kitchen  carvedilol (COREG) 6.25 MG tablet TAKE 1 TABLET BY MOUTH TWO  TIMES DAILY WITH A MEAL  . clopidogrel (PLAVIX) 75 MG tablet TAKE 1 TABLET BY MOUTH  DAILY WITH BREAKFAST  . Ergocalciferol (VITAMIN D2) 400 UNITS TABS Take 1 tablet by mouth daily.   . fluticasone (FLONASE) 50 MCG/ACT nasal spray Place 2 sprays into both nostrils daily as needed for allergies or rhinitis.   . mirtazapine (REMERON) 7.5 MG tablet TAKE 1 TABLET BY MOUTH AT  BEDTIME  . Multiple Vitamins-Minerals (PRESERVISION AREDS 2+MULTI VIT PO) Take 2 capsules by mouth daily.  . traMADol (ULTRAM) 50 MG tablet Take 1 tablet (50 mg total) by mouth every 12 (twelve) hours as needed.   No facility-administered encounter medications on file as of 06/17/2018.     Review of Systems:  Review  of Systems  Constitutional: Positive for weight loss. Negative for chills, fever and malaise/fatigue.       Poor appetite  HENT: Positive for hearing loss.   Eyes: Negative for blurred vision.  Respiratory: Negative for cough and shortness of breath.   Cardiovascular: Negative for chest pain, palpitations and leg swelling.  Gastrointestinal: Negative for abdominal pain, blood in stool, constipation, diarrhea and melena.  Genitourinary: Positive for dysuria, frequency, hematuria and urgency. Negative for flank pain.  Musculoskeletal: Positive for joint pain.  Skin: Negative for itching and rash.  Neurological: Negative for dizziness and loss of consciousness.    Health Maintenance  Topic Date Due  . OPHTHALMOLOGY EXAM  11/09/2015  . URINE MICROALBUMIN  07/04/2016  . HEMOGLOBIN A1C  10/15/2016  . FOOT EXAM  04/27/2017  . TETANUS/TDAP  05/21/2021  . INFLUENZA VACCINE  Completed  . PNA vac Low Risk Adult  Completed  . DEXA SCAN  Addressed    Physical Exam: Vitals:   06/17/18 0956  BP: 138/70  Pulse: 81  Temp: 97.7 F (36.5 C)  TempSrc: Oral  SpO2: 97%  Weight: 146 lb (66.2 kg)  Height: 5\' 7"  (1.702 m)   Body mass index is 22.87 kg/m. Physical Exam Vitals signs reviewed.  Constitutional:      Appearance: Normal appearance.  HENT:     Head: Normocephalic and atraumatic.  Cardiovascular:     Rate and Rhythm: Normal rate and regular rhythm.     Pulses: Normal pulses.     Heart sounds: Normal heart sounds.  Pulmonary:     Effort: Pulmonary effort is normal.     Breath sounds: Normal breath sounds.  Abdominal:     General: Abdomen is flat. Bowel sounds are normal. There is no distension.     Palpations: Abdomen is soft. There is no mass.     Tenderness: There is no abdominal tenderness. There is no guarding or rebound.  Musculoskeletal: Normal range of motion.  Skin:    General: Skin is warm and dry.     Capillary Refill: Capillary refill takes less than 2 seconds.    Neurological:     General: No focal deficit present.     Mental Status: She is alert and oriented to person, place, and time.  Psychiatric:        Mood and Affect: Mood normal.     Labs reviewed: Basic Metabolic Panel: Recent Labs    07/17/17 1355 07/17/17 1900 07/18/17 0520 12/03/17 0915  NA 139  --  141 145  K 4.3  --  3.6 4.3  CL 109  --  105 108  CO2 24  --  25 28  GLUCOSE 193*  --  108* 93  BUN 30*  --  23* 36*  CREATININE 1.16* 0.92 0.84 1.07*  CALCIUM 8.5*  --  8.9 9.6  MG  --  2.1  --   --   TSH  --  1.486  --   --    Liver Function Tests: Recent Labs    12/03/17 0915  AST 11  ALT 9  BILITOT 0.5  PROT 6.5   No results for input(s): LIPASE, AMYLASE in the last 8760 hours. No results for input(s): AMMONIA in the last 8760 hours. CBC: Recent Labs    07/17/17 1900 07/18/17 0520 12/03/17 0915  WBC 7.1 7.8 7.8  NEUTROABS  --   --  5,780  HGB 11.9* 11.6* 12.7  HCT 37.5 35.9* 37.6  MCV 90.4 89.8 88.3  PLT 178 176 209   Lipid Panel: No results for input(s): CHOL, HDL, LDLCALC, TRIG, CHOLHDL, LDLDIRECT in the last 8760 hours. Lab Results  Component Value Date   HGBA1C 5.6 04/17/2016    Procedures since last visit: No results found.  Assessment/Plan 1. Dysuria - definitely consistent with UTI -will tx with cipro -has already done all the conservative interventions - POC Urinalysis Dipstick - Urine Culture  2. Controlled type 2 diabetes mellitus with stage 3 chronic kidney disease, without long-term current use of insulin (HCC) -has been under good control for years w/o meds  3. Lichen sclerosus of female genitalia -continue cream from gyn for this -if pain on the external genitalia persists after she completes abx treatment, she may need to check in with her gyn  4. Acute cystitis with hematuria -will treat with cipro bid for 10 days -push fluids (in instructions)  Labs/tests ordered:   Orders Placed This Encounter  Procedures  . Urine  Culture  . POC Urinalysis Dipstick   Next appt:  07/05/2018  Xoey Warmoth L. Lewis Grivas, D.O. Geriatrics [facility] [address], [place] [address] Cell Phone (Mon-Fri 8am-5pm):  [phone] On Call:  [phone] & follow prompts after 5pm & weekends Office Phone:  [phone] Office Fax:  [phone]

## 2018-06-17 NOTE — Patient Instructions (Addendum)
Try to drink 8 8oz glasses of water and or cranberry juice for the next three days.   Then return to at least 6 glasses of water or juice daily thereafter. Pick up your cipro prescription.   You may take tylenol to help your discomfort.

## 2018-06-19 LAB — URINE CULTURE
MICRO NUMBER:: 363416
SPECIMEN QUALITY:: ADEQUATE

## 2018-07-01 DIAGNOSIS — H43812 Vitreous degeneration, left eye: Secondary | ICD-10-CM | POA: Diagnosis not present

## 2018-07-01 DIAGNOSIS — H3561 Retinal hemorrhage, right eye: Secondary | ICD-10-CM | POA: Diagnosis not present

## 2018-07-01 DIAGNOSIS — H34831 Tributary (branch) retinal vein occlusion, right eye, with macular edema: Secondary | ICD-10-CM | POA: Diagnosis not present

## 2018-07-01 DIAGNOSIS — H35351 Cystoid macular degeneration, right eye: Secondary | ICD-10-CM | POA: Diagnosis not present

## 2018-07-01 DIAGNOSIS — H43811 Vitreous degeneration, right eye: Secondary | ICD-10-CM | POA: Diagnosis not present

## 2018-07-05 ENCOUNTER — Other Ambulatory Visit: Payer: Self-pay

## 2018-07-05 ENCOUNTER — Ambulatory Visit (INDEPENDENT_AMBULATORY_CARE_PROVIDER_SITE_OTHER): Payer: Medicare Other | Admitting: Nurse Practitioner

## 2018-07-05 ENCOUNTER — Encounter: Payer: Self-pay | Admitting: Nurse Practitioner

## 2018-07-05 ENCOUNTER — Ambulatory Visit: Payer: Self-pay

## 2018-07-05 DIAGNOSIS — Z0000 Encounter for general adult medical examination without abnormal findings: Secondary | ICD-10-CM | POA: Diagnosis not present

## 2018-07-05 NOTE — Progress Notes (Signed)
Subjective:   Lisa [name] is a 85 y.o. female who presents for Medicare Annual (Subsequent) preventive examination.  Review of Systems:   Cardiac Risk Factors include: advanced age (>2men, >5 women);diabetes mellitus     Objective:     Vitals: There were no vitals taken for this visit.  There is no height or weight on file to calculate BMI.  Advanced Directives 07/05/2018 07/23/2017 07/18/2017 07/02/2017 06/14/2017 10/30/2016 07/24/2016  Does Patient Have a Medical Advance Directive? Yes Yes Yes Yes Yes Yes Yes  Type of Estate agent of [place];Living will;Out of facility DNR (pink MOST or yellow form) Healthcare Power of [place];Living will;Out of facility DNR (pink MOST or yellow form) Healthcare Power of [place];Living will;Out of facility DNR (pink MOST or yellow form) Healthcare Power of [place];Living will;Out of facility DNR (pink MOST or yellow form) Healthcare Power of [place];Living will;Out of facility DNR (pink MOST or yellow form) Out of facility DNR (pink MOST or yellow form);Living will Out of facility DNR (pink MOST or yellow form);Living will  Does patient want to make changes to medical advance directive? No - Patient declined No - Patient declined No - Patient declined No - Patient declined No - Patient declined - -  Copy of Healthcare Power of Attorney in Chart? Yes - validated most recent copy scanned in chart (See row information) Yes Yes Yes Yes - -  Would patient like information on creating a medical advance directive? - - - - - - -  Pre-existing out of facility DNR order (yellow form or pink MOST form) - Yellow form placed in chart (order not valid for inpatient use) - Yellow form placed in chart (order not valid for inpatient use) Yellow form placed in chart (order not valid for inpatient use) Yellow form placed in chart (order not valid for inpatient use) Yellow form placed in chart (order not valid for inpatient use)    Tobacco Social  History   Tobacco Use  Smoking Status Never Smoker  Smokeless Tobacco Never Used     Counseling given: Not Answered   Clinical Intake:  Pre-visit preparation completed: Yes  Pain : No/denies pain     BMI - recorded: 22.87 Nutritional Status: BMI of 19-24  Normal Nutritional Risks: None Diabetes: Yes CBG done?: No Did pt. bring in CBG monitor from home?: No  How often do you need to have someone help you when you read instructions, pamphlets, or other written materials from your doctor or pharmacy?: 1 - Never What is the last grade level you completed in school?: completed high school  Interpreter Needed?: No     Past Medical History:  Diagnosis Date  . Cervicalgia   . Chronic kidney disease, stage III (moderate) (HCC)   . Dermatophytosis of groin and perianal area   . Hyperlipidemia LDL goal < 100   . Insomnia, unspecified   . Muscle weakness (generalized)   . Osteoarthrosis, unspecified whether generalized or localized, lower leg    osteoarthritis - upper thoracic area, left knee.  . Other abnormal glucose   . Other B-complex deficiencies   . Other malaise and fatigue   . Palpitations   . Senile cataract, unspecified   . Shortness of breath   . Stroke [facility])    small "stroke" - right sided weakness for a day.  . Tachycardia, unspecified   . Type II or unspecified type diabetes mellitus with renal manifestations, not stated as uncontrolled(250.40)    "prediabetes"-no meds.  Marland Kitchen  Undiagnosed cardiac murmurs   . Unspecified essential hypertension   . Unspecified vitamin D deficiency    Past Surgical History:  Procedure Laterality Date  . CATARACT EXTRACTION, BILATERAL Bilateral   . EYE SURGERY     cataract  . TOE AMPUTATION  2002  . TONSILLECTOMY  1944  . TONSILLECTOMY     younger yrs  . TOTAL KNEE ARTHROPLASTY Left 01/15/2015   Procedure: LEFT TOTAL KNEE ARTHROPLASTY;  Surgeon: Eugenia Mcalpine, MD;  Location: [facility];  Service: Orthopedics;  Laterality:  Left;  . TUBAL LIGATION  1951   Family History  Problem Relation Age of Onset  . Cerebral aneurysm Mother   . Heart attack Father   . Alzheimer's disease Sister   . Emphysema Sister   . Stroke Sister   . Alzheimer's disease Sister   . Arthritis Sister   . Stroke Sister        Age 48   . Lung cancer Son    Social History   Socioeconomic History  . Marital status: Widowed    Spouse name: Not on file  . Number of children: 4  . Years of education: 12th  . Highest education level: Not on file  Occupational History  . Occupation: retired  Engineer, production  . Financial resource strain: Not hard at all  . Food insecurity:    Worry: Never true    Inability: Never true  . Transportation needs:    Medical: No    Non-medical: No  Tobacco Use  . Smoking status: Never Smoker  . Smokeless tobacco: Never Used  Substance and Sexual Activity  . Alcohol use: Yes    Comment: rare social every 3 or 4 months wine  . Drug use: No  . Sexual activity: Never  Lifestyle  . Physical activity:    Days per week: 6 days    Minutes per session: 10 min  . Stress: Not at all  Relationships  . Social connections:    Talks on phone: More than three times a week    Gets together: More than three times a week    Attends religious service: More than 4 times per year    Active member of club or organization: No    Attends meetings of clubs or organizations: Never    Relationship status: Widowed  Other Topics Concern  . Not on file  Social History Narrative   Patient lives at home alone   Patient drinks coffee  And coke     Outpatient Encounter Medications as of 07/05/2018  Medication Sig  . b complex vitamins tablet Take 1 tablet by mouth daily.  . carvedilol (COREG) 6.25 MG tablet TAKE 1 TABLET BY MOUTH TWO  TIMES DAILY WITH A MEAL  . clopidogrel (PLAVIX) 75 MG tablet TAKE 1 TABLET BY MOUTH  DAILY WITH BREAKFAST  . Ergocalciferol (VITAMIN D2) 400 UNITS TABS Take 1 tablet by mouth daily.   .  fluticasone (FLONASE) 50 MCG/ACT nasal spray Place 2 sprays into both nostrils daily as needed for allergies or rhinitis.   . mirtazapine (REMERON) 7.5 MG tablet TAKE 1 TABLET BY MOUTH AT  BEDTIME  . Multiple Vitamins-Minerals (PRESERVISION AREDS 2+MULTI VIT PO) Take 2 capsules by mouth daily.  . traMADol (ULTRAM) 50 MG tablet Take 1 tablet (50 mg total) by mouth every 12 (twelve) hours as needed.  . [DISCONTINUED] ciprofloxacin (CIPRO) 500 MG tablet Take 1 tablet (500 mg total) by mouth 2 (two) times daily.   No facility-administered  encounter medications on file as of 07/05/2018.     Activities of Daily Living In your present state of health, do you have any difficulty performing the following activities: 07/05/2018 07/18/2017  Hearing? N N  Vision? N N  Difficulty concentrating or making decisions? N N  Walking or climbing stairs? N Y  Dressing or bathing? N N  Doing errands, shopping? N N  Preparing Food and eating ? N -  Using the Toilet? N -  In the past six months, have you accidently leaked urine? Y -  Comment wears a pad -  Do you have problems with loss of bowel control? N -  Managing your Medications? N -  Managing your Finances? N -  Housekeeping or managing your Housekeeping? N -  Some recent data might be hidden    Patient Care Team: Kermit Baloeed, Tiffany L, DO as PCP - General (Geriatric Medicine) Eugenia Mcalpineollins, Robert, MD as Consulting Physician (Orthopedic Surgery) Marvel PlanXu, Jindong, MD as Consulting Physician (Neurology) Sallye LatGroat, Christopher, MD as Consulting Physician (Ophthalmology)    Assessment:   This is a routine wellness examination for Lisa JohnsKathleen.  Exercise Activities and Dietary recommendations Current Exercise Habits: Home exercise routine, Type of exercise: walking, Time (Minutes): 10, Frequency (Times/Week): 7, Weekly Exercise (Minutes/Week): 70, Intensity: Mild  Goals    . <enter goal here>     Starting 04/17/16, I will maintain my current lifestyle.        Fall Risk  Fall Risk  07/05/2018 04/15/2018 12/03/2017 07/23/2017 07/02/2017  Falls in the past year? 0 0 No No No  Number falls in past yr: 0 0 - - -  Injury with Fall? 0 0 - - -  Risk for fall due to : - - - - -   Is the patient's home free of loose throw rugs in walkways, pet beds, electrical cords, etc?   yes      Grab bars in the bathroom? yes      Handrails on the stairs?   yes      Adequate lighting?   yes  Timed Get Up and Go performed: na  Depression Screen PHQ 2/9 Scores 07/05/2018 04/15/2018 12/03/2017 07/23/2017  PHQ - 2 Score 0 0 0 0     Cognitive Function MMSE - Mini Mental State Exam 07/02/2017 04/17/2016 04/12/2015  Not completed: - - (No Data)  Orientation to time 5 5 5   Orientation to Place 5 5 5   Registration 3 3 3   Attention/ Calculation 4 4 5   Recall 1 3 2   Language- name 2 objects 2 2 2   Language- repeat 1 1 1   Language- follow 3 step command 3 3 3   Language- read & follow direction 1 1 1   Write a sentence 1 1 1   Copy design 1 0 0  Total score 27 28 28      6CIT Screen 07/05/2018  What Year? 0 points  What month? 0 points  What time? 0 points  Count back from 20 0 points  Months in reverse 0 points  Repeat phrase 0 points  Total Score 0    Immunization History  Administered Date(s) Administered  . Influenza Split 11/18/2008, 12/28/2009  . Influenza Whole 12/24/2012  . Influenza, High Dose Seasonal PF 12/03/2017  . Influenza,inj,Quad PF,6+ Mos 12/21/2016  . Influenza-Unspecified 01/13/2014, 12/19/2014, 01/15/2016  . PPD Test 01/18/2015  . Pneumococcal Conjugate-13 01/12/2014  . Pneumococcal Polysaccharide-23 05/22/2011  . Tdap 05/22/2011  . Zoster 12/20/2010    Qualifies for Shingles Vaccine?yes  Screening Tests Health Maintenance  Topic Date Due  . OPHTHALMOLOGY EXAM  11/09/2015  . URINE MICROALBUMIN  07/04/2016  . HEMOGLOBIN A1C  10/15/2016  . FOOT EXAM  09/04/2018 (Originally 04/27/2017)  . INFLUENZA VACCINE  10/19/2018  . TETANUS/TDAP  05/21/2021  .  PNA vac Low Risk Adult  Completed  . DEXA SCAN  Addressed    Cancer Screenings: Lung: Low Dose CT Chest recommended if Age 25-80 years, 30 pack-year currently smoking OR have quit w/in 15years. Patient does not qualify. Breast:  Up to date on Mammogram? No   Up to date of Bone Density/Dexa? No Colorectal: aged out  Additional Screenings: : Hepatitis C Screening: na     Plan:      I have personally reviewed and noted the following in the patient's chart:   . Medical and social history . Use of alcohol, tobacco or illicit drugs  . Current medications and supplements . Functional ability and status . Nutritional status . Physical activity . Advanced directives . List of other physicians . Hospitalizations, surgeries, and ER visits in previous 12 months . Vitals . Screenings to include cognitive, depression, and falls . Referrals and appointments  In addition, I have reviewed and discussed with patient certain preventive protocols, quality metrics, and best practice recommendations. A written personalized care plan for preventive services as well as general preventive health recommendations were provided to patient.     Sharon Seller, NP  07/05/2018

## 2018-07-05 NOTE — Patient Instructions (Signed)
[name] , Thank you for taking time to come for your Medicare Wellness Visit. I appreciate your ongoing commitment to your health goals. Please review the following plan we discussed and let me know if I can assist you in the future.   Screening recommendations/referrals: Colonoscopy aged out Mammogram aged out Bone Density declined Recommended yearly ophthalmology/optometry visit for glaucoma screening and checkup Recommended yearly dental visit for hygiene and checkup  Vaccinations: Influenza vaccine up to date- next needed October 2020 Pneumococcal vaccine up to date Tdap vaccine up to date Shingles vaccine has had zoster, consider shingrix.   Advanced directives: on file.   Conditions/risks identified: none.  Next appointment: 1 year.    Preventive Care 22 Years and Older, Female Preventive care refers to lifestyle choices and visits with your health care provider that can promote health and wellness. What does preventive care include?  A yearly physical exam. This is also called an annual well check.  Dental exams once or twice a year.  Routine eye exams. Ask your health care provider how often you should have your eyes checked.  Personal lifestyle choices, including:  Daily care of your teeth and gums.  Regular physical activity.  Eating a healthy diet.  Avoiding tobacco and drug use.  Limiting alcohol use.  Practicing safe sex.  Taking low-dose aspirin every day.  Taking vitamin and mineral supplements as recommended by your health care provider. What happens during an annual well check? The services and screenings done by your health care provider during your annual well check will depend on your age, overall health, lifestyle risk factors, and family history of disease. Counseling  Your health care provider may ask you questions about your:  Alcohol use.  Tobacco use.  Drug use.  Emotional well-being.  Home and relationship well-being.   Sexual activity.  Eating habits.  History of falls.  Memory and ability to understand (cognition).  Work and work Astronomer.  Reproductive health. Screening  You may have the following tests or measurements:  Height, weight, and BMI.  Blood pressure.  Lipid and cholesterol levels. These may be checked every 5 years, or more frequently if you are over 46 years old.  Skin check.  Lung cancer screening. You may have this screening every year starting at age 81 if you have a 30-pack-year history of smoking and currently smoke or have quit within the past 15 years.  Fecal occult blood test (FOBT) of the stool. You may have this test every year starting at age 53.  Flexible sigmoidoscopy or colonoscopy. You may have a sigmoidoscopy every 5 years or a colonoscopy every 10 years starting at age 86.  Hepatitis C blood test.  Hepatitis B blood test.  Sexually transmitted disease (STD) testing.  Diabetes screening. This is done by checking your blood sugar (glucose) after you have not eaten for a while (fasting). You may have this done every 1-3 years.  Bone density scan. This is done to screen for osteoporosis. You may have this done starting at age 78.  Mammogram. This may be done every 1-2 years. Talk to your health care provider about how often you should have regular mammograms. Talk with your health care provider about your test results, treatment options, and if necessary, the need for more tests. Vaccines  Your health care provider may recommend certain vaccines, such as:  Influenza vaccine. This is recommended every year.  Tetanus, diphtheria, and acellular pertussis (Tdap, Td) vaccine. You may need a Td booster every  10 years.  Zoster vaccine. You may need this after age 46.  Pneumococcal 13-valent conjugate (PCV13) vaccine. One dose is recommended after age 75.  Pneumococcal polysaccharide (PPSV23) vaccine. One dose is recommended after age 48. Talk to your  health care provider about which screenings and vaccines you need and how often you need them. This information is not intended to replace advice given to you by your health care provider. Make sure you discuss any questions you have with your health care provider. Document Released: 04/02/2015 Document Revised: 11/24/2015 Document Reviewed: 01/05/2015 Elsevier Interactive Patient Education  2017 [place] Prevention in the Home Falls can cause injuries. They can happen to people of all ages. There are many things you can do to make your home safe and to help prevent falls. What can I do on the outside of my home?  Regularly fix the edges of walkways and driveways and fix any cracks.  Remove anything that might make you trip as you walk through a door, such as a raised step or threshold.  Trim any bushes or trees on the path to your home.  Use bright outdoor lighting.  Clear any walking paths of anything that might make someone trip, such as rocks or tools.  Regularly check to see if handrails are loose or broken. Make sure that both sides of any steps have handrails.  Any raised decks and porches should have guardrails on the edges.  Have any leaves, snow, or ice cleared regularly.  Use sand or salt on walking paths during winter.  Clean up any spills in your garage right away. This includes oil or grease spills. What can I do in the bathroom?  Use night lights.  Install grab bars by the toilet and in the tub and shower. Do not use towel bars as grab bars.  Use non-skid mats or decals in the tub or shower.  If you need to sit down in the shower, use a plastic, non-slip stool.  Keep the floor dry. Clean up any water that spills on the floor as soon as it happens.  Remove soap buildup in the tub or shower regularly.  Attach bath mats securely with double-sided non-slip rug tape.  Do not have throw rugs and other things on the floor that can make you trip. What  can I do in the bedroom?  Use night lights.  Make sure that you have a light by your bed that is easy to reach.  Do not use any sheets or blankets that are too big for your bed. They should not hang down onto the floor.  Have a firm chair that has side arms. You can use this for support while you get dressed.  Do not have throw rugs and other things on the floor that can make you trip. What can I do in the kitchen?  Clean up any spills right away.  Avoid walking on wet floors.  Keep items that you use a lot in easy-to-reach places.  If you need to reach something above you, use a strong step stool that has a grab bar.  Keep electrical cords out of the way.  Do not use floor polish or wax that makes floors slippery. If you must use wax, use non-skid floor wax.  Do not have throw rugs and other things on the floor that can make you trip. What can I do with my stairs?  Do not leave any items on the stairs.  Make sure  that there are handrails on both sides of the stairs and use them. Fix handrails that are broken or loose. Make sure that handrails are as long as the stairways.  Check any carpeting to make sure that it is firmly attached to the stairs. Fix any carpet that is loose or worn.  Avoid having throw rugs at the top or bottom of the stairs. If you do have throw rugs, attach them to the floor with carpet tape.  Make sure that you have a light switch at the top of the stairs and the bottom of the stairs. If you do not have them, ask someone to add them for you. What else can I do to help prevent falls?  Wear shoes that:  Do not have high heels.  Have rubber bottoms.  Are comfortable and fit you well.  Are closed at the toe. Do not wear sandals.  If you use a stepladder:  Make sure that it is fully opened. Do not climb a closed stepladder.  Make sure that both sides of the stepladder are locked into place.  Ask someone to hold it for you, if possible.   Clearly mark and make sure that you can see:  Any grab bars or handrails.  First and last steps.  Where the edge of each step is.  Use tools that help you move around (mobility aids) if they are needed. These include:  Canes.  Walkers.  Scooters.  Crutches.  Turn on the lights when you go into a dark area. Replace any light bulbs as soon as they burn out.  Set up your furniture so you have a clear path. Avoid moving your furniture around.  If any of your floors are uneven, fix them.  If there are any pets around you, be aware of where they are.  Review your medicines with your doctor. Some medicines can make you feel dizzy. This can increase your chance of falling. Ask your doctor what other things that you can do to help prevent falls. This information is not intended to replace advice given to you by your health care provider. Make sure you discuss any questions you have with your health care provider. Document Released: 12/31/2008 Document Revised: 08/12/2015 Document Reviewed: 04/10/2014 Elsevier Interactive Patient Education  2017 [employer].

## 2018-07-05 NOTE — Progress Notes (Signed)
This service is provided via telemedicine  No vital signs collected/recorded due to the encounter was a telemedicine visit.   Location of patient (ex: home, work):  Home  Patient consents to a telephone visit:  Yes  Location of the provider (ex: office, home): [employer], Office   Name of any referring provider:  Kermit Balo, DO  Names of all persons participating in the telemedicine service and their role in the encounter:  S.Chrae B/CMA, Abbey Chatters, NP, and Patient   Time spent on call:  10 min with Medical Assistant

## 2018-07-14 ENCOUNTER — Encounter: Payer: Self-pay | Admitting: Internal Medicine

## 2018-07-29 ENCOUNTER — Other Ambulatory Visit: Payer: Self-pay | Admitting: Internal Medicine

## 2018-07-29 DIAGNOSIS — I129 Hypertensive chronic kidney disease with stage 1 through stage 4 chronic kidney disease, or unspecified chronic kidney disease: Secondary | ICD-10-CM

## 2018-08-19 DIAGNOSIS — H43811 Vitreous degeneration, right eye: Secondary | ICD-10-CM | POA: Diagnosis not present

## 2018-08-19 DIAGNOSIS — H35351 Cystoid macular degeneration, right eye: Secondary | ICD-10-CM | POA: Diagnosis not present

## 2018-08-19 DIAGNOSIS — H34831 Tributary (branch) retinal vein occlusion, right eye, with macular edema: Secondary | ICD-10-CM | POA: Diagnosis not present

## 2018-08-19 DIAGNOSIS — H3561 Retinal hemorrhage, right eye: Secondary | ICD-10-CM | POA: Diagnosis not present

## 2018-08-22 ENCOUNTER — Encounter: Payer: Self-pay | Admitting: Internal Medicine

## 2018-08-22 ENCOUNTER — Ambulatory Visit (INDEPENDENT_AMBULATORY_CARE_PROVIDER_SITE_OTHER): Payer: Medicare Other | Admitting: Internal Medicine

## 2018-08-22 ENCOUNTER — Other Ambulatory Visit: Payer: Self-pay

## 2018-08-22 ENCOUNTER — Ambulatory Visit: Payer: Medicare Other | Admitting: Internal Medicine

## 2018-08-22 DIAGNOSIS — R2689 Other abnormalities of gait and mobility: Secondary | ICD-10-CM | POA: Diagnosis not present

## 2018-08-22 DIAGNOSIS — I129 Hypertensive chronic kidney disease with stage 1 through stage 4 chronic kidney disease, or unspecified chronic kidney disease: Secondary | ICD-10-CM | POA: Diagnosis not present

## 2018-08-22 DIAGNOSIS — F5101 Primary insomnia: Secondary | ICD-10-CM

## 2018-08-22 DIAGNOSIS — R5383 Other fatigue: Secondary | ICD-10-CM | POA: Diagnosis not present

## 2018-08-22 DIAGNOSIS — N904 Leukoplakia of vulva: Secondary | ICD-10-CM

## 2018-08-22 DIAGNOSIS — E1122 Type 2 diabetes mellitus with diabetic chronic kidney disease: Secondary | ICD-10-CM | POA: Diagnosis not present

## 2018-08-22 DIAGNOSIS — N183 Chronic kidney disease, stage 3 unspecified: Secondary | ICD-10-CM

## 2018-08-22 NOTE — Progress Notes (Signed)
Patient ID: Lisa [name], female   DOB: 29-Mar-1921, 85 y.o.   MRN: [id number] This service is provided via telemedicine  No vital signs collected/recorded due to the encounter was a telemedicine visit.   Location of patient (ex: home, work):  HOME  Patient consents to a telephone visit:  YES  Location of the provider (ex: office, home):  OFFICE  Name of any referring provider:  Stepanie Graver DO  Names of all persons participating in the telemedicine service and their role in the encounter:  PATIENT, Lisa [name], CMA, Lisa Cicalese, DO  Time spent on call:  3:31    Provider:  Dyneisha Murchison L. Renato Gails, D.O., C.M.D.  Code Status: DNR Goals of Care:  Advanced Directives 07/05/2018  Does Patient Have a Medical Advance Directive? Yes  Type of Estate agent of [place];Living will;Out of facility DNR (pink MOST or yellow form)  Does patient want to make changes to medical advance directive? No - Patient declined  Copy of Healthcare Power of Attorney in Chart? Yes - validated most recent copy scanned in chart (See row information)  Would patient like information on creating a medical advance directive? -  Pre-existing out of facility DNR order (yellow form or pink MOST form) -     Chief Complaint  Patient presents with  . Medical Management of Chronic Issues    follow-up    HPI: Patient is a 85 y.o. female seen today for medical management of chronic diseases.    Her bladder symptoms have resolved.    She has nothing different from what it was except when she gets up in the morning, she can hardly go at all.  She has no energy and can't move around.  She eats ready-prepared breakfasts now b/c too tired to eat it if she makes it.  Gets a lot better by noon time.    Right knee that needed to be replaced, but wasn't gives her some trouble, but not a lot of times.  When it was raining bad, she had a hard time getting around.  She did trip over something--no  injury, not even sore.  She did get two black eyes and hit her nose.  MMSE 27/30 in April.    She has never slept well, but no different than it's been for years.  Appetite is alright.  She lost her appetite with the uti, but it came back.  She manages to cook her own lunch and supper.  Will once in a while have her daughter or someone in to fix a meal with her.    She has not needed her flonase.    Past Medical History:  Diagnosis Date  . Cervicalgia   . Chronic kidney disease, stage III (moderate) (HCC)   . Dermatophytosis of groin and perianal area   . Hyperlipidemia LDL goal < 100   . Insomnia, unspecified   . Muscle weakness (generalized)   . Osteoarthrosis, unspecified whether generalized or localized, lower leg    osteoarthritis - upper thoracic area, left knee.  . Other abnormal glucose   . Other B-complex deficiencies   . Other malaise and fatigue   . Palpitations   . Senile cataract, unspecified   . Shortness of breath   . Stroke [facility])    small "stroke" - right sided weakness for a day.  . Tachycardia, unspecified   . Type II or unspecified type diabetes mellitus with renal manifestations, not stated as uncontrolled(250.40)    "prediabetes"-no meds.  Mar[place] Kitchen  Undiagnosed cardiac murmurs   . Unspecified essential hypertension   . Unspecified vitamin D deficiency     Past Surgical History:  Procedure Laterality Date  . CATARACT EXTRACTION, BILATERAL Bilateral   . EYE SURGERY     cataract  . TOE AMPUTATION  2002  . TONSILLECTOMY  1944  . TONSILLECTOMY     younger yrs  . TOTAL KNEE ARTHROPLASTY Left 01/15/2015   Procedure: LEFT TOTAL KNEE ARTHROPLASTY;  Surgeon: Eugenia Mcalpine, MD;  Location: [facility];  Service: Orthopedics;  Laterality: Left;  . TUBAL LIGATION  1951    Allergies  Allergen Reactions  . Ace Inhibitors Cough  . Lisinopril Cough  . Simvastatin Other (See Comments)    Body aches and pains    Outpatient Encounter Medications as of 08/22/2018   Medication Sig  . b complex vitamins tablet Take 1 tablet by mouth daily.  . carvedilol (COREG) 6.25 MG tablet TAKE 1 TABLET BY MOUTH TWO  TIMES DAILY WITH A MEAL  . clopidogrel (PLAVIX) 75 MG tablet TAKE 1 TABLET BY MOUTH  DAILY WITH BREAKFAST  . Ergocalciferol (VITAMIN D2) 400 UNITS TABS Take 1 tablet by mouth daily.   . fluticasone (FLONASE) 50 MCG/ACT nasal spray Place 2 sprays into both nostrils daily as needed for allergies or rhinitis.   . mirtazapine (REMERON) 7.5 MG tablet TAKE 1 TABLET BY MOUTH AT  BEDTIME  . Multiple Vitamins-Minerals (PRESERVISION AREDS 2+MULTI VIT PO) Take 2 capsules by mouth daily.  . traMADol (ULTRAM) 50 MG tablet Take 1 tablet (50 mg total) by mouth every 12 (twelve) hours as needed.   No facility-administered encounter medications on file as of 08/22/2018.     Review of Systems:  Review of Systems  Constitutional: Positive for malaise/fatigue. Negative for chills and fever.  HENT: Positive for hearing loss.   Eyes: Negative for blurred vision.  Respiratory: Positive for shortness of breath. Negative for cough and wheezing.   Cardiovascular: Negative for chest pain and leg swelling.  Gastrointestinal: Negative for abdominal pain, blood in stool, constipation, diarrhea and melena.  Genitourinary: Positive for urgency. Negative for dysuria.  Musculoskeletal: Positive for falls and joint pain.  Skin: Negative for itching and rash.  Neurological: Negative for dizziness, loss of consciousness and headaches.  Endo/Heme/Allergies: Bruises/bleeds easily.  Psychiatric/Behavioral: Negative for depression and memory loss. The patient has insomnia. The patient is not nervous/anxious.     Health Maintenance  Topic Date Due  . OPHTHALMOLOGY EXAM  11/09/2015  . URINE MICROALBUMIN  07/04/2016  . HEMOGLOBIN A1C  10/15/2016  . FOOT EXAM  09/04/2018 (Originally 04/27/2017)  . INFLUENZA VACCINE  10/19/2018  . TETANUS/TDAP  05/21/2021  . PNA vac Low Risk Adult   Completed  . DEXA SCAN  Addressed    Physical Exam: Could not be performed as visit non face-to-face via phone   Labs reviewed: Basic Metabolic Panel: Recent Labs    12/03/17 0915  NA 145  K 4.3  CL 108  CO2 28  GLUCOSE 93  BUN 36*  CREATININE 1.07*  CALCIUM 9.6   Liver Function Tests: Recent Labs    12/03/17 0915  AST 11  ALT 9  BILITOT 0.5  PROT 6.5   No results for input(s): LIPASE, AMYLASE in the last 8760 hours. No results for input(s): AMMONIA in the last 8760 hours. CBC: Recent Labs    12/03/17 0915  WBC 7.8  NEUTROABS 5,780  HGB 12.7  HCT 37.6  MCV 88.3  PLT 209  Lipid Panel: No results for input(s): CHOL, HDL, LDLCALC, TRIG, CHOLHDL, LDLDIRECT in the last 8760 hours. Lab Results  Component Value Date   HGBA1C 5.6 04/17/2016    Procedures since last visit: No results found.  Assessment/Plan 1. Controlled type 2 diabetes mellitus with stage 3 chronic kidney disease, without long-term current use of insulin (HCC) -Avoid nephrotoxic agents like nsaids, dose adjust renally excreted meds, hydrate. -has been very well controlled despite her liking of sweets  2. Lichen sclerosus of female genitalia -no changes here, urinary symptoms currently improved after last UTI tx--push fluids; has cream for this prn  3. Hypertension, renal disease, stage 1-4 or unspecified chronic kidney disease -bp is controlled, cont same regimen  4. Balance problem -ongoing, had a mechanical fall recently fortunately w/o major injury -cont to move slowly and use cane preferably  5. Other fatigue -due to poor sleep, age and increasing frailty  6.  Insomnia -cont remeron therapy  Labs/tests ordered:  No orders of the defined types were placed in this encounter.  Next appt:  12/26/2018  Non face-to-face time spent on televisit:  27 mins  Karla Pavone L. Alexsa Flaum, D.O. Geriatrics [facility] [address], [place] [address] Cell  Phone (Mon-Fri 8am-5pm):  [phone] On Call:  [phone] & follow prompts after 5pm & weekends Office Phone:  [phone] Office Fax:  [phone]

## 2018-08-27 ENCOUNTER — Other Ambulatory Visit: Payer: Self-pay | Admitting: *Deleted

## 2018-08-27 DIAGNOSIS — M1711 Unilateral primary osteoarthritis, right knee: Secondary | ICD-10-CM

## 2018-08-27 MED ORDER — TRAMADOL HCL 50 MG PO TABS: 50.0000 mg | ORAL_TABLET | Freq: Two times a day (BID) | ORAL | 1 refills | Status: DC | PRN

## 2018-08-27 NOTE — Telephone Encounter (Signed)
Received fax refill request from [facility] Verified LR: 05/28/2018

## 2018-09-30 DIAGNOSIS — H3561 Retinal hemorrhage, right eye: Secondary | ICD-10-CM | POA: Diagnosis not present

## 2018-09-30 DIAGNOSIS — H35033 Hypertensive retinopathy, bilateral: Secondary | ICD-10-CM | POA: Diagnosis not present

## 2018-09-30 DIAGNOSIS — H35351 Cystoid macular degeneration, right eye: Secondary | ICD-10-CM | POA: Diagnosis not present

## 2018-09-30 DIAGNOSIS — H34831 Tributary (branch) retinal vein occlusion, right eye, with macular edema: Secondary | ICD-10-CM | POA: Diagnosis not present

## 2018-10-03 ENCOUNTER — Telehealth: Payer: Self-pay | Admitting: Internal Medicine

## 2018-10-03 NOTE — Telephone Encounter (Signed)
Unfortunately, I don't think it's safe for her to go without a mask to the grocery store or [employer].  I know it's hard to stay socially isolated also.  I'd like her to wear a mask if she does go out.  If she does not, she's running a higher risk of acquiring covid which would likely lead to her death.

## 2018-10-03 NOTE — Telephone Encounter (Signed)
Patient states she needs to speak with Dr. Mariea Clonts regarding her breathing and the difficulties of wearing a mask.  She did not want to schedule an appointment.

## 2018-10-03 NOTE — Telephone Encounter (Signed)
Spoke with patient and she states she has SOB as a condition and when she puts the mask on it feels worse. Per pt she doesn't want to wear a mask to the grocery store or [employer]. I asked pt if she could order her groceries? and maybe she can have her books delivered? Pt states she would like to go as much as possible. I advised to pt she's in the high risk group. Pt requested me to send the message to Dr.Reed and let her decide.

## 2018-10-04 NOTE — Telephone Encounter (Signed)
Spoke with and tried to tell her what Dr. Mariea Clonts said and pt said if she can't breathe then she will die anyway and disconnected the call.

## 2018-11-11 DIAGNOSIS — H34831 Tributary (branch) retinal vein occlusion, right eye, with macular edema: Secondary | ICD-10-CM | POA: Diagnosis not present

## 2018-11-11 DIAGNOSIS — H3561 Retinal hemorrhage, right eye: Secondary | ICD-10-CM | POA: Diagnosis not present

## 2018-11-11 DIAGNOSIS — H35351 Cystoid macular degeneration, right eye: Secondary | ICD-10-CM | POA: Diagnosis not present

## 2018-11-11 DIAGNOSIS — H348311 Tributary (branch) retinal vein occlusion, right eye, with retinal neovascularization: Secondary | ICD-10-CM | POA: Diagnosis not present

## 2018-11-26 ENCOUNTER — Other Ambulatory Visit: Payer: Self-pay | Admitting: Internal Medicine

## 2018-11-26 DIAGNOSIS — G4700 Insomnia, unspecified: Secondary | ICD-10-CM

## 2018-11-26 DIAGNOSIS — E782 Mixed hyperlipidemia: Secondary | ICD-10-CM

## 2018-12-23 DIAGNOSIS — H3561 Retinal hemorrhage, right eye: Secondary | ICD-10-CM | POA: Diagnosis not present

## 2018-12-23 DIAGNOSIS — H353111 Nonexudative age-related macular degeneration, right eye, early dry stage: Secondary | ICD-10-CM | POA: Diagnosis not present

## 2018-12-23 DIAGNOSIS — H34831 Tributary (branch) retinal vein occlusion, right eye, with macular edema: Secondary | ICD-10-CM | POA: Diagnosis not present

## 2018-12-23 DIAGNOSIS — H35351 Cystoid macular degeneration, right eye: Secondary | ICD-10-CM | POA: Diagnosis not present

## 2018-12-26 ENCOUNTER — Other Ambulatory Visit: Payer: Self-pay

## 2018-12-26 ENCOUNTER — Ambulatory Visit (INDEPENDENT_AMBULATORY_CARE_PROVIDER_SITE_OTHER): Payer: Medicare Other | Admitting: Internal Medicine

## 2018-12-26 ENCOUNTER — Encounter: Payer: Self-pay | Admitting: Internal Medicine

## 2018-12-26 VITALS — BP 134/90 | HR 80 | Temp 97.7°F | Ht 67.0 in | Wt 145.0 lb

## 2018-12-26 DIAGNOSIS — R350 Frequency of micturition: Secondary | ICD-10-CM

## 2018-12-26 DIAGNOSIS — I10 Essential (primary) hypertension: Secondary | ICD-10-CM | POA: Diagnosis not present

## 2018-12-26 DIAGNOSIS — E1122 Type 2 diabetes mellitus with diabetic chronic kidney disease: Secondary | ICD-10-CM

## 2018-12-26 DIAGNOSIS — Z23 Encounter for immunization: Secondary | ICD-10-CM

## 2018-12-26 DIAGNOSIS — N904 Leukoplakia of vulva: Secondary | ICD-10-CM | POA: Diagnosis not present

## 2018-12-26 DIAGNOSIS — Z7189 Other specified counseling: Secondary | ICD-10-CM

## 2018-12-26 DIAGNOSIS — N183 Chronic kidney disease, stage 3 unspecified: Secondary | ICD-10-CM | POA: Diagnosis not present

## 2018-12-26 MED ORDER — CEPHALEXIN 500 MG PO CAPS: 500.0000 mg | ORAL_CAPSULE | Freq: Two times a day (BID) | ORAL | 0 refills | Status: DC

## 2018-12-26 NOTE — Progress Notes (Signed)
Location:  [facility] clinic Provider:  Emmett Arntz L. Mariea Clonts, D.O., C.M.D.  Code Status: DNR Goals of Care:  Advanced Directives 07/05/2018  Does Patient Have a Medical Advance Directive? Yes  Type of Paramedic of [place];Living will;Out of facility DNR (pink MOST or yellow form)  Does patient want to make changes to medical advance directive? No - Patient declined  Copy of [place] in Chart? Yes - validated most recent copy scanned in chart (See row information)  Would patient like information on creating a medical advance directive? -  Pre-existing out of facility DNR order (yellow form or pink MOST form) -     Chief Complaint  Patient presents with  . Medical Management of Chronic Issues    4  month follow-up   . Immunizations    Flu vaccine today   . Quality Metric Gaps    Foot exam, MALB, and A1c due. Per patient eye exam up to date, Dr.Rankin and Dr.Groat(primary)(see's every 2 years)     HPI: Patient is a 85 y.o. female seen today for medical management of chronic diseases.  Pt not diabetic, but, unfortunately, though it was noted that she was prediabetic, when chart was moved into epic, abstracter put in that she was diabetic and then wrote prediabetic behind it.  hba1c has not reached 6.5.    She's drinking as much water as I've told her and she's been up hourly.  This has been for 2-3 weeks.  She only goes a little each time.  Occasionally it hurts to go.  No suprapubic discomfort.  No fever.  It feels so much like a UTI, she's not sure if it's that or the lichen sclerosis condition with gynecology.  She has the cream to use.  She uses it in spells when it acts up.   It feels swollen down there again when she stands up.  It's not comfortable.  She attempted to pass urine for a sample today, but could only make a few drops which was inadequate for testing.  Past Medical History:  Diagnosis Date  . Cervicalgia   . Chronic kidney disease,  stage III (moderate)   . Dermatophytosis of groin and perianal area   . Hyperlipidemia LDL goal < 100   . Insomnia, unspecified   . Muscle weakness (generalized)   . Osteoarthrosis, unspecified whether generalized or localized, lower leg    osteoarthritis - upper thoracic area, left knee.  . Other abnormal glucose   . Other B-complex deficiencies   . Other malaise and fatigue   . Palpitations   . Senile cataract, unspecified   . Shortness of breath   . Stroke [facility])    small "stroke" - right sided weakness for a day.  . Tachycardia, unspecified   . Undiagnosed cardiac murmurs   . Unspecified essential hypertension   . Unspecified vitamin D deficiency     Past Surgical History:  Procedure Laterality Date  . CATARACT EXTRACTION, BILATERAL Bilateral   . EYE SURGERY     cataract  . TOE AMPUTATION  2002  . TONSILLECTOMY  1944  . TONSILLECTOMY     younger yrs  . TOTAL KNEE ARTHROPLASTY Left 01/15/2015   Procedure: LEFT TOTAL KNEE ARTHROPLASTY;  Surgeon: Sydnee Cabal, MD;  Location: [facility];  Service: Orthopedics;  Laterality: Left;  . TUBAL LIGATION  1951    Allergies  Allergen Reactions  . Ace Inhibitors Cough  . Lisinopril Cough  . Simvastatin Other (See Comments)  Body aches and pains    Outpatient Encounter Medications as of 12/26/2018  Medication Sig  . b complex vitamins tablet Take 1 tablet by mouth daily.  . carvedilol (COREG) 6.25 MG tablet TAKE 1 TABLET BY MOUTH TWO  TIMES DAILY WITH A MEAL  . clopidogrel (PLAVIX) 75 MG tablet TAKE 1 TABLET BY MOUTH  DAILY WITH BREAKFAST  . Ergocalciferol (VITAMIN D2) 400 UNITS TABS Take 1 tablet by mouth daily.   . fluticasone (FLONASE) 50 MCG/ACT nasal spray Place 2 sprays into both nostrils daily as needed for allergies or rhinitis.   . mirtazapine (REMERON) 7.5 MG tablet TAKE 1 TABLET BY MOUTH AT  BEDTIME  . Multiple Vitamins-Minerals (PRESERVISION AREDS 2+MULTI VIT PO) Take 2 capsules by mouth daily.  . traMADol (ULTRAM)  50 MG tablet Take 1 tablet (50 mg total) by mouth every 12 (twelve) hours as needed.  . [DISCONTINUED] cephALEXin (KEFLEX) 500 MG capsule Take 1 capsule (500 mg total) by mouth 2 (two) times daily.   No facility-administered encounter medications on file as of 12/26/2018.     Review of Systems:  Review of Systems  Constitutional: Negative for chills, fever and malaise/fatigue.  Eyes: Negative for blurred vision.  Respiratory: Negative for cough and shortness of breath.   Cardiovascular: Negative for chest pain, palpitations and leg swelling.  Gastrointestinal: Negative for abdominal pain, blood in stool, constipation, diarrhea and melena.  Genitourinary: Positive for dysuria, frequency and urgency. Negative for flank pain and hematuria.  Musculoskeletal: Positive for joint pain. Negative for falls.  Skin: Negative for itching and rash.  Neurological: Negative for dizziness and loss of consciousness.  Endo/Heme/Allergies: Bruises/bleeds easily.  Psychiatric/Behavioral: Negative for depression and memory loss. The patient is not nervous/anxious and does not have insomnia.     Health Maintenance  Topic Date Due  . TETANUS/TDAP  05/21/2021  . INFLUENZA VACCINE  Completed  . PNA vac Low Risk Adult  Completed  . DEXA SCAN  Addressed    Physical Exam: Vitals:   12/26/18 0936  BP: 134/90  Pulse: 80  Temp: 97.7 F (36.5 C)  TempSrc: Temporal  SpO2: 97%  Weight: 145 lb (65.8 kg)  Height: 5\' 7"  (1.702 m)   Body mass index is 22.71 kg/m. Physical Exam Vitals signs reviewed.  Constitutional:      General: She is not in acute distress.    Appearance: Normal appearance. She is not ill-appearing or toxic-appearing.  HENT:     Head: Normocephalic and atraumatic.  Cardiovascular:     Rate and Rhythm: Normal rate and regular rhythm.     Pulses: Normal pulses.     Heart sounds: Normal heart sounds.  Pulmonary:     Effort: Pulmonary effort is normal.     Breath sounds: Normal breath  sounds. No wheezing, rhonchi or rales.  Abdominal:     General: Bowel sounds are normal. There is no distension.     Tenderness: There is no abdominal tenderness. There is no right CVA tenderness, left CVA tenderness, guarding or rebound.  Genitourinary:    Vagina: No vaginal discharge.     Comments: Small area of purple discoloration of labia minora; does not appear like it did when her lichen sclerosis was flaring previously Musculoskeletal: Normal range of motion.     Right lower leg: No edema.     Left lower leg: No edema.  Skin:    General: Skin is warm and dry.  Neurological:     General: No focal deficit present.  Mental Status: She is alert and oriented to person, place, and time.     Labs reviewed: Basic Metabolic Panel: Recent Labs    12/26/18 1100  NA 141  K 5.1  CL 105  CO2 29  GLUCOSE 108  BUN 31*  CREATININE 1.28*  CALCIUM 9.2   Liver Function Tests: No results for input(s): AST, ALT, ALKPHOS, BILITOT, PROT, ALBUMIN in the last 8760 hours. No results for input(s): LIPASE, AMYLASE in the last 8760 hours. No results for input(s): AMMONIA in the last 8760 hours. CBC: No results for input(s): WBC, NEUTROABS, HGB, HCT, MCV, PLT in the last 8760 hours. Lipid Panel: No results for input(s): CHOL, HDL, LDLCALC, TRIG, CHOLHDL, LDLDIRECT in the last 8760 hours. Lab Results  Component Value Date   HGBA1C 5.7 (H) 12/26/2018   Assessment/Plan 1. Urinary frequency -much worse lately and keeping her up at night -will treat for UTI--was unable to make adequate urine for UA c+s though she tried -treated with keflex for UTI -was already pushing fluids  2. Lichen sclerosus of female genitalia -does not appear in flare -uses steroid cream as prescribed by gyn when it does flare up -had only a small purple area and not much white appearing tissue--certainly not like before  3. CKD3 - unfortunately, documentation from abstracting her chart when we moved to epic  indicated diabetes when she was only prediabetic all along -f/u labs - Hemoglobin A1c - Basic metabolic panel  4. Need for influenza vaccination - Flu Vaccine QUAD High Dose(Fluad) was given  5. Essential hypertension -bp has been well controlled, no dizziness  6. ACP (advance care planning) - Do not attempt resuscitation (DNR)--just reentered expired order--wishes have not changed  Labs/tests ordered:   Lab Orders     Hemoglobin A1c     Basic metabolic panel  Next appt:  05/01/2019    Laverle Pillard L. Thailan Sava, D.O. Geriatrics [facility] [address] [place], [place] [address] Cell Phone (Mon-Fri 8am-5pm):  [phone] On Call:  [phone] & follow prompts after 5pm & weekends Office Phone:  [phone] Office Fax:  [phone]

## 2018-12-26 NOTE — Patient Instructions (Signed)
Try using a barrier cream to apply to your entire vaginal/rectal area to protect from the urine.  You may use this in the morning and after bathing before bed.

## 2018-12-27 ENCOUNTER — Telehealth: Payer: Self-pay

## 2018-12-27 DIAGNOSIS — R350 Frequency of micturition: Secondary | ICD-10-CM

## 2018-12-27 LAB — BASIC METABOLIC PANEL
BUN/Creatinine Ratio: 24 (calc) — ABNORMAL HIGH (ref 6–22)
BUN: 31 mg/dL — ABNORMAL HIGH (ref 7–25)
CO2: 29 mmol/L (ref 20–32)
Calcium: 9.2 mg/dL (ref 8.6–10.4)
Chloride: 105 mmol/L (ref 98–110)
Creat: 1.28 mg/dL — ABNORMAL HIGH (ref 0.60–0.88)
Glucose, Bld: 108 mg/dL (ref 65–139)
Potassium: 5.1 mmol/L (ref 3.5–5.3)
Sodium: 141 mmol/L (ref 135–146)

## 2018-12-27 LAB — HEMOGLOBIN A1C
Hgb A1c MFr Bld: 5.7 % of total Hgb — ABNORMAL HIGH (ref <5.7)
Mean Plasma Glucose: 117 (calc)
eAG (mmol/L): 6.5 (calc)

## 2018-12-27 MED ORDER — CEPHALEXIN 500 MG PO CAPS: 500.0000 mg | ORAL_CAPSULE | Freq: Two times a day (BID) | ORAL | 0 refills | Status: DC

## 2018-12-27 NOTE — Telephone Encounter (Signed)
-----   Message from Gayland Curry, DO sent at 12/27/2018  7:56 AM EDT ----- Sugar trended up just a little bit.  Kidney function declined a bit--suspect this is related to her current urine infection and should improve with treatment.  She told me she was drinking plenty of water.

## 2018-12-27 NOTE — Telephone Encounter (Signed)
I called patient to to discuss labs results, patient verbalized understanding, and states her local pharmacy had not received her antibiotic yet. I reviewed rx for Keflex and it appeared to be sent to the mail order pharmacy in error.  Patient aware I will send to local pharmacy and call mail order to have them void rx. Patient agreed this would be an appropriate action..  I called [facility] and rx was already being processed and per representative there was no way to stop processing for it already left the warehouse.  Per [facility] representative the local pharmacy will need to get an override to process rx since it was already processed through them and the patient will need to send the unopen package back to [facility] once received.  I called [employer] [facility]), spoke with Shelton Silvas and informed her of interaction with [facility]. Shelton Silvas attempted to override with no success and had to place me on a brief hold to see if the pharmacist could override the filled to soon message (since recently filled with [facility]). Once call was resumed Shelton Silvas states she will call to see if she can get rx override and call me back if we need to do anything further.  I called patient to inform her of all the above interactions. Patient was understanding and will await notification from [facility] that rx is ready for pick-up.

## 2019-01-01 ENCOUNTER — Encounter: Payer: Self-pay | Admitting: Internal Medicine

## 2019-01-04 ENCOUNTER — Encounter (HOSPITAL_COMMUNITY): Payer: Self-pay | Admitting: Emergency Medicine

## 2019-01-04 ENCOUNTER — Emergency Department (HOSPITAL_COMMUNITY)
Admission: EM | Admit: 2019-01-04 | Discharge: 2019-01-04 | Disposition: A | Discharge status: Home / Self Care | Payer: Medicare Other | Attending: Emergency Medicine | Admitting: Emergency Medicine

## 2019-01-04 ENCOUNTER — Other Ambulatory Visit: Payer: Self-pay

## 2019-01-04 ENCOUNTER — Emergency Department (HOSPITAL_COMMUNITY): Payer: Medicare Other

## 2019-01-04 DIAGNOSIS — Z96652 Presence of left artificial knee joint: Secondary | ICD-10-CM | POA: Diagnosis not present

## 2019-01-04 DIAGNOSIS — N183 Chronic kidney disease, stage 3 unspecified: Secondary | ICD-10-CM | POA: Diagnosis not present

## 2019-01-04 DIAGNOSIS — R55 Syncope and collapse: Secondary | ICD-10-CM

## 2019-01-04 DIAGNOSIS — Z79899 Other long term (current) drug therapy: Secondary | ICD-10-CM | POA: Diagnosis not present

## 2019-01-04 DIAGNOSIS — I4510 Unspecified right bundle-branch block: Secondary | ICD-10-CM | POA: Diagnosis not present

## 2019-01-04 DIAGNOSIS — R0902 Hypoxemia: Secondary | ICD-10-CM | POA: Diagnosis not present

## 2019-01-04 DIAGNOSIS — I959 Hypotension, unspecified: Secondary | ICD-10-CM | POA: Diagnosis not present

## 2019-01-04 DIAGNOSIS — I129 Hypertensive chronic kidney disease with stage 1 through stage 4 chronic kidney disease, or unspecified chronic kidney disease: Secondary | ICD-10-CM | POA: Insufficient documentation

## 2019-01-04 LAB — COMPREHENSIVE METABOLIC PANEL
ALT: 32 U/L (ref 0–44)
AST: 21 U/L (ref 15–41)
Albumin: 4.1 g/dL (ref 3.5–5.0)
Alkaline Phosphatase: 100 U/L (ref 38–126)
Anion gap: 10 (ref 5–15)
BUN: 24 mg/dL — ABNORMAL HIGH (ref 8–23)
CO2: 24 mmol/L (ref 22–32)
Calcium: 9 mg/dL (ref 8.9–10.3)
Chloride: 104 mmol/L (ref 98–111)
Creatinine, Ser: 1.23 mg/dL — ABNORMAL HIGH (ref 0.44–1.00)
GFR calc Af Amer: 43 mL/min — ABNORMAL LOW (ref >60)
GFR calc non Af Amer: 37 mL/min — ABNORMAL LOW (ref >60)
Glucose, Bld: 128 mg/dL — ABNORMAL HIGH (ref 70–99)
Potassium: 4.3 mmol/L (ref 3.5–5.1)
Sodium: 138 mmol/L (ref 135–145)
Total Bilirubin: 0.9 mg/dL (ref 0.3–1.2)
Total Protein: 7.1 g/dL (ref 6.5–8.1)

## 2019-01-04 LAB — URINALYSIS, ROUTINE W REFLEX MICROSCOPIC
Bilirubin Urine: NEGATIVE
Glucose, UA: 50 mg/dL — AB
Ketones, ur: 5 mg/dL — AB
Nitrite: NEGATIVE
Protein, ur: 100 mg/dL — AB
Specific Gravity, Urine: 1.014 (ref 1.005–1.030)
pH: 6 (ref 5.0–8.0)

## 2019-01-04 LAB — CBC WITH DIFFERENTIAL/PLATELET
Abs Immature Granulocytes: 0.11 10*3/uL — ABNORMAL HIGH (ref 0.00–0.07)
Basophils Absolute: 0.1 10*3/uL (ref 0.0–0.1)
Basophils Relative: 0 %
Eosinophils Absolute: 0.1 10*3/uL (ref 0.0–0.5)
Eosinophils Relative: 1 %
HCT: 41.7 % (ref 36.0–46.0)
Hemoglobin: 13.3 g/dL (ref 12.0–15.0)
Immature Granulocytes: 1 %
Lymphocytes Relative: 12 %
Lymphs Abs: 1.4 10*3/uL (ref 0.7–4.0)
MCH: 30.4 pg (ref 26.0–34.0)
MCHC: 31.9 g/dL (ref 30.0–36.0)
MCV: 95.4 fL (ref 80.0–100.0)
Monocytes Absolute: 0.7 10*3/uL (ref 0.1–1.0)
Monocytes Relative: 6 %
Neutro Abs: 9.5 10*3/uL — ABNORMAL HIGH (ref 1.7–7.7)
Neutrophils Relative %: 80 %
Platelets: 276 10*3/uL (ref 150–400)
RBC: 4.37 MIL/uL (ref 3.87–5.11)
RDW: 13.5 % (ref 11.5–15.5)
WBC: 11.8 10*3/uL — ABNORMAL HIGH (ref 4.0–10.5)
nRBC: 0 % (ref 0.0–0.2)

## 2019-01-04 LAB — CBG MONITORING, ED: Glucose-Capillary: 111 mg/dL — ABNORMAL HIGH (ref 70–99)

## 2019-01-04 MED ORDER — SODIUM CHLORIDE 0.9 % IV SOLN
1.0000 g | Freq: Once | INTRAVENOUS | Status: AC
  Administered 2019-01-04: 1 g via INTRAVENOUS
  Filled 2019-01-04: qty 10

## 2019-01-04 MED ORDER — CEPHALEXIN 250 MG PO CAPS: 250.0000 mg | ORAL_CAPSULE | Freq: Three times a day (TID) | ORAL | 0 refills | Status: DC

## 2019-01-04 MED ORDER — SODIUM CHLORIDE 0.9 % IV SOLN
INTRAVENOUS | Status: DC
  Administered 2019-01-04: 14:00:00 via INTRAVENOUS

## 2019-01-04 MED ORDER — SODIUM CHLORIDE 0.9 % IV BOLUS
500.0000 mL | Freq: Once | INTRAVENOUS | Status: AC
  Administered 2019-01-04: 500 mL via INTRAVENOUS

## 2019-01-04 NOTE — Discharge Instructions (Signed)
Return to the ED if you have any recurrent symptoms or change your mind about being admitted to the hospital, follow up with your doctor next week, make sure to stay hydrated

## 2019-01-04 NOTE — ED Triage Notes (Signed)
Patient arrived by EMS from home. Pts family reported that she had a near syncopal episode.  EMS reported that she had one syncopal episode.   Pt stated that she had nausea and vomiting last night.   Pt has a hx of orthostatic hypotension.

## 2019-01-04 NOTE — ED Notes (Signed)
Urine culture sent down to lab with urinalysis. 

## 2019-01-04 NOTE — ED Provider Notes (Signed)
[facility]-EMERGENCY DEPT Provider Note   CSN: [id number] Arrival date & time: 01/04/19  1319     History   Chief Complaint Chief Complaint  Patient presents with  . Loss of Consciousness    HPI Lisa [name] is a 85 y.o. female.     HPI Patient presents to the emergency room for evaluation of a syncopal episode.  Patient states this past week she was diagnosed with a urinary tract infection.  She is not sure that has gotten much better.  Last evening she did have some episodes of nausea and vomiting for some reason.  She was not having any abdominal pain and this morning those symptoms resolved and she was feeling better.  She was able to drink water and have some toast.  She had friends come over to show her some pictures.  Patient does not recall exactly what happened but she apparently had a near syncopal episode.  Patient's friends called 911.  When EMS arrived the patient told them she was feeling fine and she has had this happen to her before.  She did not want to come to the hospital.  After EMS left the patient apparently had a second episode.  They were then able to convince her to come to the emergency room.  Patient states she feels fine now.  She has a history of having orthostatic hypotension.  She thinks that is what is going on now.  She is not having any trouble with headache or chest pain.  She is not having any numbness or weakness.  No trouble with her speech or vision.  She denies any abdominal pain.  No fevers or chills. Past Medical History:  Diagnosis Date  . Cervicalgia   . Chronic kidney disease, stage III (moderate)   . Dermatophytosis of groin and perianal area   . Hyperlipidemia LDL goal < 100   . Insomnia, unspecified   . Muscle weakness (generalized)   . Osteoarthrosis, unspecified whether generalized or localized, lower leg    osteoarthritis - upper thoracic area, left knee.  . Other abnormal glucose   . Other B-complex  deficiencies   . Other malaise and fatigue   . Palpitations   . Senile cataract, unspecified   . Shortness of breath   . Stroke [facility](HCC)    small "stroke" - right sided weakness for a day.  . Tachycardia, unspecified   . Undiagnosed cardiac murmurs   . Unspecified essential hypertension   . Unspecified vitamin D deficiency     Patient Active Problem List   Diagnosis Date Noted  . Other fatigue 12/03/2017  . Anemia 12/03/2017  . Balance problem 12/03/2017  . Lichen sclerosus of female genitalia 12/03/2017  . Syncope 07/17/2017  . Renal hypertension 07/05/2015  . Neck pain, chronic 07/05/2015  . Status post total left knee replacement 07/05/2015  . Primary osteoarthritis of left knee 01/15/2015  . S/P knee replacement 01/15/2015  . Dry skin 01/28/2014  . HLD (hyperlipidemia) 01/06/2014  . Essential hypertension 01/06/2014  . B12 deficiency 09/29/2013  . Mixed hyperlipidemia 09/29/2013  . Hypertension, renal disease, stage 1-4 or unspecified chronic kidney disease 06/26/2013  . Seasonal allergies 06/26/2013  . CVA (cerebral vascular accident) (HCC) 04/09/2013  . Stroke (HCC) 04/09/2013  . Insomnia 09/14/2012  . Hyperlipidemia   . Osteoarthritis of right knee   . Undiagnosed cardiac murmurs   . Shortness of breath   . Chronic kidney disease, stage III (moderate) (HCC)   .  Cervicalgia   . HYPERTENSION 11/30/2008  . Dyspnea 11/30/2008  . History of cardiovascular disorder 11/30/2008    Past Surgical History:  Procedure Laterality Date  . CATARACT EXTRACTION, BILATERAL Bilateral   . EYE SURGERY     cataract  . TOE AMPUTATION  2002  . TONSILLECTOMY  1944  . TONSILLECTOMY     younger yrs  . TOTAL KNEE ARTHROPLASTY Left 01/15/2015   Procedure: LEFT TOTAL KNEE ARTHROPLASTY;  Surgeon: Eugenia Mcalpine, MD;  Location: [facility];  Service: Orthopedics;  Laterality: Left;  . TUBAL LIGATION  1951     OB History   No obstetric history on file.      Home Medications    Prior  to Admission medications   Medication Sig Start Date End Date Taking? Authorizing Provider  carvedilol (COREG) 6.25 MG tablet TAKE 1 TABLET BY MOUTH TWO  TIMES DAILY WITH A MEAL Patient taking differently: Take 6.25 mg by mouth 2 (two) times daily with a meal.  07/29/18  Yes Reed, Tiffany L, DO  clopidogrel (PLAVIX) 75 MG tablet TAKE 1 TABLET BY MOUTH  DAILY WITH BREAKFAST Patient taking differently: Take 75 mg by mouth daily.  11/26/18  Yes Reed, Tiffany L, DO  Cyanocobalamin (B-12 PO) Take 1 tablet by mouth daily.   Yes [provider]  diphenhydramine-acetaminophen (TYLENOL PM) 25-500 MG TABS tablet Take 1 tablet by mouth at bedtime.   Yes [provider]  mirtazapine (REMERON) 7.5 MG tablet TAKE 1 TABLET BY MOUTH AT  BEDTIME Patient taking differently: Take 7.5 mg by mouth at bedtime.  11/26/18  Yes Reed, Tiffany L, DO  Multiple Vitamins-Minerals (PRESERVISION AREDS 2+MULTI VIT PO) Take 2 capsules by mouth daily.   Yes [provider]  traMADol (ULTRAM) 50 MG tablet Take 1 tablet (50 mg total) by mouth every 12 (twelve) hours as needed. Patient taking differently: Take 50 mg by mouth every 12 (twelve) hours as needed for moderate pain.  08/27/18  Yes Reed, Tiffany L, DO  VITAMIN D PO Take 1 tablet by mouth daily.   Yes [provider]  cephALEXin (KEFLEX) 250 MG capsule Take 1 capsule (250 mg total) by mouth 3 (three) times daily. 01/04/19   Linwood Dibbles, MD    Family History Family History  Problem Relation Age of Onset  . Cerebral aneurysm Mother   . Heart attack Father   . Alzheimer's disease Sister   . Emphysema Sister   . Stroke Sister   . Alzheimer's disease Sister   . Arthritis Sister   . Stroke Sister        Age 17   . Lung cancer Son     Social History Social History   Tobacco Use  . Smoking status: Never Smoker  . Smokeless tobacco: Never Used  Substance Use Topics  . Alcohol use: Yes    Comment: rare social every 3 or 4 months wine  .  Drug use: No     Allergies   Ace inhibitors, Lisinopril, and Simvastatin   Review of Systems Review of Systems  All other systems reviewed and are negative.    Physical Exam Updated Vital Signs BP (!) 154/83 (BP Location: Right Arm)   Pulse 85   Temp 97.9 F (36.6 C) (Oral)   Resp (!) 22   Ht 1.702 m (5\' 7" )   Wt 65.8 kg   SpO2 98%   BMI 22.72 kg/m   Physical Exam Vitals signs and nursing note reviewed.  Constitutional:  General: She is not in acute distress.    Appearance: She is well-developed.  HENT:     Head: Normocephalic and atraumatic.     Right Ear: External ear normal.     Left Ear: External ear normal.  Eyes:     General: No scleral icterus.       Right eye: No discharge.        Left eye: No discharge.     Conjunctiva/sclera: Conjunctivae normal.  Neck:     Musculoskeletal: Neck supple.     Trachea: No tracheal deviation.  Cardiovascular:     Rate and Rhythm: Normal rate and regular rhythm.  Pulmonary:     Effort: Pulmonary effort is normal. No respiratory distress.     Breath sounds: Normal breath sounds. No stridor. No wheezing or rales.  Abdominal:     General: Bowel sounds are normal. There is no distension.     Palpations: Abdomen is soft.     Tenderness: There is no abdominal tenderness. There is no guarding or rebound.  Musculoskeletal:        General: No tenderness.  Skin:    General: Skin is warm and dry.     Findings: No rash.  Neurological:     Mental Status: She is alert and oriented to person, place, and time.     Cranial Nerves: No cranial nerve deficit (no facial droop, extraocular movements intact, no slurred speech).     Sensory: No sensory deficit.     Motor: No abnormal muscle tone or seizure activity.     Coordination: Coordination normal.     Comments: 5/5 strength bilateral upper and lower extremities, sensation intact in all extremities, no visual field cuts, no left or right sided neglect,, no nystagmus noted        ED Treatments / Results  Labs (all labs ordered are listed, but only abnormal results are displayed) Labs Reviewed  CBC WITH DIFFERENTIAL/PLATELET - Abnormal; Notable for the following components:      Result Value   WBC 11.8 (*)    Neutro Abs 9.5 (*)    Abs Immature Granulocytes 0.11 (*)    All other components within normal limits  COMPREHENSIVE METABOLIC PANEL - Abnormal; Notable for the following components:   Glucose, Bld 128 (*)    BUN 24 (*)    Creatinine, Ser 1.23 (*)    GFR calc non Af Amer 37 (*)    GFR calc Af Amer 43 (*)    All other components within normal limits  URINALYSIS, ROUTINE W REFLEX MICROSCOPIC - Abnormal; Notable for the following components:   APPearance CLOUDY (*)    Glucose, UA 50 (*)    Hgb urine dipstick SMALL (*)    Ketones, ur 5 (*)    Protein, ur 100 (*)    Leukocytes,Ua SMALL (*)    Bacteria, UA RARE (*)    All other components within normal limits  CBG MONITORING, ED - Abnormal; Notable for the following components:   Glucose-Capillary 111 (*)    All other components within normal limits  URINE CULTURE    EKG EKG Interpretation  Date/Time:  Saturday January 04 2019 13:35:01 EDT Ventricular Rate:  78 PR Interval:    QRS Duration: 138 QT Interval:  421 QTC Calculation: 480 R Axis:   -3 Text Interpretation:  Sinus rhythm Right bundle branch block No significant change since last tracing Confirmed by Dorie Rank [phone]) on 01/04/2019 1:38:46 PM   Radiology Dg Chest [facility]  1 View  Result Date: 01/04/2019 CLINICAL DATA:  Syncope. EXAM: PORTABLE CHEST 1 VIEW COMPARISON:  04/15/2018 FINDINGS: Cardiomegaly again noted. There is no evidence of focal airspace disease, pulmonary edema, suspicious pulmonary nodule/mass, pleural effusion, or pneumothorax. No acute bony abnormalities are identified. IMPRESSION: Cardiomegaly without evidence of acute cardiopulmonary disease. Electronically Signed   By: Harmon Pier M.D.   On: 01/04/2019 14:39     Procedures Procedures (including critical care time)  Medications Ordered in ED Medications  sodium chloride 0.9 % bolus 500 mL (500 mLs Intravenous New Bag/Given 01/04/19 1409)    And  0.9 %  sodium chloride infusion ( Intravenous New Bag/Given 01/04/19 1409)  cefTRIAXone (ROCEPHIN) 1 g in sodium chloride 0.9 % 100 mL IVPB (has no administration in time range)     Initial Impression / Assessment and Plan / ED Course  I have reviewed the triage vital signs and the nursing notes.  Pertinent labs & imaging results that were available during my care of the patient were reviewed by me and considered in my medical decision making (see chart for details).  Clinical Course as of Jan 04 1540  Sat Jan 04, 2019  1520 Patient's laboratory tests do show an elevated white blood cell count.  Urinalysis does suggest possible persistent urinary tract infection although she does have squamous cell epithelial contamination.   [JK]  1521 Creatinine slightly elevated but appears stable from recent values.   [JK]  1521 Patient has been able to walk around the emergency room without difficulty.   [JK]    Clinical Course User Index [JK] Linwood Dibbles, MD     Pt presented with syncope.  Pt states she has a history of orthostatic hypotension.  Pt had recent nausea and vomiting last evening.   No vomiting in the ED.  No abdominal pain.    NO fever, doubt sepsis. Pt is feeling well in the ED.  She has been able to walk around without difficulty.  Considering her age and syncopal episodes I recommended admission to the hospital.  Pt does not want to be admitted.  SHe feels fine and would like to go home.  Pt cautioned to return if she has any recurrent symptoms or if she changes her mind.     Final Clinical Impressions(s) / ED Diagnoses   Final diagnoses:  Syncope and collapse    ED Discharge Orders         Ordered    cephALEXin (KEFLEX) 250 MG capsule  3 times daily     01/04/19 1537            Linwood Dibbles, MD 01/04/19 1541

## 2019-01-06 ENCOUNTER — Other Ambulatory Visit: Payer: Self-pay | Admitting: Internal Medicine

## 2019-01-06 DIAGNOSIS — Z66 Do not resuscitate: Secondary | ICD-10-CM

## 2019-01-06 LAB — URINE CULTURE

## 2019-01-27 DIAGNOSIS — H353111 Nonexudative age-related macular degeneration, right eye, early dry stage: Secondary | ICD-10-CM | POA: Diagnosis not present

## 2019-01-27 DIAGNOSIS — H34831 Tributary (branch) retinal vein occlusion, right eye, with macular edema: Secondary | ICD-10-CM | POA: Diagnosis not present

## 2019-01-27 DIAGNOSIS — H353222 Exudative age-related macular degeneration, left eye, with inactive choroidal neovascularization: Secondary | ICD-10-CM | POA: Diagnosis not present

## 2019-01-27 DIAGNOSIS — H35351 Cystoid macular degeneration, right eye: Secondary | ICD-10-CM | POA: Diagnosis not present

## 2019-01-27 DIAGNOSIS — H3561 Retinal hemorrhage, right eye: Secondary | ICD-10-CM | POA: Diagnosis not present

## 2019-02-03 ENCOUNTER — Encounter: Payer: Self-pay | Admitting: Internal Medicine

## 2019-02-03 ENCOUNTER — Ambulatory Visit (INDEPENDENT_AMBULATORY_CARE_PROVIDER_SITE_OTHER): Payer: Medicare Other | Admitting: Internal Medicine

## 2019-02-03 ENCOUNTER — Other Ambulatory Visit: Payer: Self-pay

## 2019-02-03 VITALS — BP 130/90 | HR 80 | Temp 97.8°F | Resp 20 | Ht 67.0 in | Wt 146.4 lb

## 2019-02-03 DIAGNOSIS — N3000 Acute cystitis without hematuria: Secondary | ICD-10-CM

## 2019-02-03 DIAGNOSIS — I951 Orthostatic hypotension: Secondary | ICD-10-CM

## 2019-02-03 MED ORDER — PHENAZOPYRIDINE HCL 200 MG PO TABS: 200.0000 mg | ORAL_TABLET | Freq: Three times a day (TID) | ORAL | 0 refills | Status: DC | PRN

## 2019-02-03 NOTE — Progress Notes (Signed)
Location:  [facility]   Place of Service:     Provider: Dr. Hollace Kinnier  Code Status: DNR Goals of Care:  Advanced Directives 02/03/2019  Does Patient Have a Medical Advance Directive? Yes  Type of Advance Directive -  Does patient want to make changes to medical advance directive? -  Copy of [place] in Chart? -  Would patient like information on creating a medical advance directive? -  Pre-existing out of facility DNR order (yellow form or pink MOST form) -     Chief Complaint  Patient presents with  . Acute Visit    Recurring UTI    HPI: Patient is a 85 y.o. female seen today for an acute visit for recurrent UTI. She was treated in the office 12/26/2018 for UTI and sent home with keflex. Since then, she was seen by the [facility] for syncope on 01/04/2019. A urine culture was done during this encounter and PO Keflex was given at discharge. Urine culture results suggested a recollection of sample be redone.   Since her ED visit, she has not has any episodes of syncope. She claims this occurs when she gets up fast. She is aware she needs to change positions slowly. She is taking her carvedilol daily.    Today she presents with symptoms of burning urination, frequency, and bladder pressure. The frequency has also made her fatigued to the point where she is not sleeping well at night.   Her lichen sclerosus is stable at this time and she denies increased discomfort. She is using her steroid cream a few times a week.       Past Medical History:  Diagnosis Date  . Cervicalgia   . Chronic kidney disease, stage III (moderate)   . Dermatophytosis of groin and perianal area   . Hyperlipidemia LDL goal < 100   . Insomnia, unspecified   . Muscle weakness (generalized)   . Osteoarthrosis, unspecified whether generalized or localized, lower leg    osteoarthritis - upper thoracic area, left knee.  . Other  abnormal glucose   . Other B-complex deficiencies   . Other malaise and fatigue   . Palpitations   . Senile cataract, unspecified   . Shortness of breath   . Stroke [facility])    small "stroke" - right sided weakness for a day.  . Tachycardia, unspecified   . Undiagnosed cardiac murmurs   . Unspecified essential hypertension   . Unspecified vitamin D deficiency     Past Surgical History:  Procedure Laterality Date  . CATARACT EXTRACTION, BILATERAL Bilateral   . EYE SURGERY     cataract  . TOE AMPUTATION  2002  . TONSILLECTOMY  1944  . TONSILLECTOMY     younger yrs  . TOTAL KNEE ARTHROPLASTY Left 01/15/2015   Procedure: LEFT TOTAL KNEE ARTHROPLASTY;  Surgeon: Sydnee Cabal, MD;  Location: [facility];  Service: Orthopedics;  Laterality: Left;  . TUBAL LIGATION  1951    Allergies  Allergen Reactions  . Ace Inhibitors Cough  . Lisinopril Cough  . Simvastatin Other (See Comments)    Body aches and pains    Outpatient Encounter Medications as of 02/03/2019  Medication Sig  . carvedilol (COREG) 6.25 MG tablet TAKE 1 TABLET BY MOUTH TWO  TIMES DAILY WITH A MEAL  . cephALEXin (KEFLEX) 250 MG capsule Take 1 capsule (250 mg total) by mouth 3 (three) times daily.  . clopidogrel (PLAVIX) 75 MG  tablet TAKE 1 TABLET BY MOUTH  DAILY WITH BREAKFAST  . Cyanocobalamin (B-12 PO) Take 1 tablet by mouth daily.  . diphenhydramine-acetaminophen (TYLENOL PM) 25-500 MG TABS tablet Take 1 tablet by mouth at bedtime.  . mirtazapine (REMERON) 7.5 MG tablet TAKE 1 TABLET BY MOUTH AT  BEDTIME  . Multiple Vitamins-Minerals (PRESERVISION AREDS 2+MULTI VIT PO) Take 2 capsules by mouth daily.  . traMADol (ULTRAM) 50 MG tablet Take 1 tablet (50 mg total) by mouth every 12 (twelve) hours as needed.  Marland Kitchen. VITAMIN D PO Take 1 tablet by mouth daily.   No facility-administered encounter medications on file as of 02/03/2019.     Review of Systems:  Review of Systems  Constitutional: Positive for activity change.  Negative for appetite change and fever.  Respiratory: Negative for cough, shortness of breath and wheezing.   Cardiovascular: Negative for chest pain and palpitations.  Genitourinary: Positive for dysuria and frequency. Negative for flank pain, hematuria and vaginal bleeding.  Psychiatric/Behavioral: Positive for sleep disturbance. Negative for dysphoric mood. The patient is not nervous/anxious.     Health Maintenance  Topic Date Due  . TETANUS/TDAP  05/21/2021  . INFLUENZA VACCINE  Completed  . PNA vac Low Risk Adult  Completed  . DEXA SCAN  Addressed    Physical Exam: Vitals:   02/03/19 0827  BP: 130/90  Pulse: 80  Resp: 20  Temp: 97.8 F (36.6 C)  TempSrc: Oral  SpO2: 98%  Weight: 146 lb 6.4 oz (66.4 kg)  Height: 5\' 7"  (1.702 m)   Body mass index is 22.93 kg/m. Physical Exam Vitals signs reviewed.  Constitutional:      Appearance: Normal appearance. She is normal weight.  Cardiovascular:     Rate and Rhythm: Normal rate and regular rhythm.     Pulses: Normal pulses.     Heart sounds: Normal heart sounds. No murmur.  Pulmonary:     Effort: Pulmonary effort is normal. No respiratory distress.     Breath sounds: Normal breath sounds. No wheezing.  Abdominal:     General: Abdomen is flat.     Palpations: Abdomen is soft.     Tenderness: There is no abdominal tenderness.  Skin:    General: Skin is warm and dry.     Capillary Refill: Capillary refill takes less than 2 seconds.  Neurological:     General: No focal deficit present.     Mental Status: She is alert and oriented to person, place, and time. Mental status is at baseline.  Psychiatric:        Mood and Affect: Mood normal.        Behavior: Behavior normal.        Thought Content: Thought content normal.        Judgment: Judgment normal.     Labs reviewed: Basic Metabolic Panel: Recent Labs    12/26/18 1100 01/04/19 1355  NA 141 138  K 5.1 4.3  CL 105 104  CO2 29 24  GLUCOSE 108 128*  BUN 31*  24*  CREATININE 1.28* 1.23*  CALCIUM 9.2 9.0   Liver Function Tests: Recent Labs    01/04/19 1355  AST 21  ALT 32  ALKPHOS 100  BILITOT 0.9  PROT 7.1  ALBUMIN 4.1   No results for input(s): LIPASE, AMYLASE in the last 8760 hours. No results for input(s): AMMONIA in the last 8760 hours. CBC: Recent Labs    01/04/19 1355  WBC 11.8*  NEUTROABS 9.5*  HGB 13.3  HCT 41.7  MCV 95.4  PLT 276   Lipid Panel: No results for input(s): CHOL, HDL, LDLCALC, TRIG, CHOLHDL, LDLDIRECT in the last 8760 hours. Lab Results  Component Value Date   HGBA1C 5.7 (H) 12/26/2018    Procedures since last visit: Dg Chest Port 1 View  Result Date: 01/04/2019 CLINICAL DATA:  Syncope. EXAM: PORTABLE CHEST 1 VIEW COMPARISON:  04/15/2018 FINDINGS: Cardiomegaly again noted. There is no evidence of focal airspace disease, pulmonary edema, suspicious pulmonary nodule/mass, pleural effusion, or pneumothorax. No acute bony abnormalities are identified. IMPRESSION: Cardiomegaly without evidence of acute cardiopulmonary disease. Electronically Signed   By: Harmon Pier M.D.   On: 01/04/2019 14:39    Assessment/Plan 1. Acute recurrent cystitis - symptoms have occurred for over a month and treated with keflex with no success - repeat U/A and urine culture - Phenazopyridium HCL 200 mg oral three times a day PRN for 4 days - encourage cranberry juice daily - stay hydrated  - change pads a few times a day  2. Orthostatic hypotension -  no new episodes since ED visit - continue to ambulate with a cane - change positions slowly when getting up - stay hydrated   3. Syncope due to orthostatic hypotension - same as above      Labs/tests ordered:  Urine culture- today Next appt:  05/01/2019

## 2019-02-03 NOTE — Patient Instructions (Signed)
We will call you when your culture result comes back. Continue to hydrate with water and cranberry juice. Get up very slowly/change positions slowly.

## 2019-02-05 LAB — URINALYSIS, ROUTINE W REFLEX MICROSCOPIC
Bilirubin Urine: NEGATIVE
Glucose, UA: NEGATIVE
Hgb urine dipstick: NEGATIVE
Hyaline Cast: NONE SEEN /LPF
Ketones, ur: NEGATIVE
Nitrite: NEGATIVE
Specific Gravity, Urine: 1.02 (ref 1.001–1.03)
WBC, UA: >=60 /HPF — AB (ref 0–5)
pH: 6 (ref 5.0–8.0)

## 2019-02-05 LAB — URINE CULTURE
MICRO NUMBER:: 1106314
SPECIMEN QUALITY:: ADEQUATE

## 2019-02-07 ENCOUNTER — Encounter: Payer: Self-pay | Admitting: Family

## 2019-02-07 ENCOUNTER — Other Ambulatory Visit: Payer: Self-pay

## 2019-02-07 ENCOUNTER — Ambulatory Visit (INDEPENDENT_AMBULATORY_CARE_PROVIDER_SITE_OTHER): Payer: Medicare Other | Admitting: Family

## 2019-02-07 VITALS — BP 130/78 | HR 92 | Temp 97.3°F | Ht 67.0 in | Wt 145.4 lb

## 2019-02-07 DIAGNOSIS — N904 Leukoplakia of vulva: Secondary | ICD-10-CM

## 2019-02-07 DIAGNOSIS — R399 Unspecified symptoms and signs involving the genitourinary system: Secondary | ICD-10-CM

## 2019-02-07 DIAGNOSIS — N3000 Acute cystitis without hematuria: Secondary | ICD-10-CM | POA: Diagnosis not present

## 2019-02-07 NOTE — Progress Notes (Signed)
Provider:   FNP-C  Kermit Baloeed, Tiffany L, DO  Patient Care Team: Kermit Baloeed, Tiffany L, DO as PCP - General (Geriatric Medicine) Eugenia Mcalpineollins, Robert, MD as Consulting Physician (Orthopedic Surgery) Marvel PlanXu, Jindong, MD as Consulting Physician (Neurology) Sallye LatGroat, Christopher, MD as Consulting Physician (Ophthalmology) Luciana Axeankin, Alford HighlandGary A, MD as Consulting Physician (Ophthalmology)  Extended Emergency Contact Information Primary Emergency Contact: Smith,Kathy Address: [address]          [place], [place] [address] [place] Home Phone: [phone] Relation: Daughter Secondary Emergency Contact: Tawni Carnesash,June          [place], [place] Mobile Phone: [phone] Relation: Friend  Code Status:  DNR Goals of care: Advanced Directive information Advanced Directives 02/03/2019  Does Patient Have a Medical Advance Directive? Yes  Type of Advance Directive -  Does patient want to make changes to medical advance directive? -  Copy of Healthcare Power of Attorney in Chart? -  Would patient like information on creating a medical advance directive? -  Pre-existing out of facility DNR order (yellow form or pink MOST form) -     Chief Complaint  Patient presents with  . Acute Visit    Patient states she does not have a catheter in place, but had collected urine monday and it was contaminated. Patient states she has UTI     HPI:  Pt is a 85 y.o. female seen today for an acute visit for in and out catheter for urine specimen.Per Dr.Reed patient had urine culture collected in the ED and at the facility but both specimen showed contamination.Patient states continues to have urinary frequency,urgency and burning with urination.Also complains of lower abdominal tenderness.she denies any fever,chills,nausea,vomiting or back pain. She states drinks regular cranberry juice and about 6 glasses of water. She [facility] had [facility] of [facility]states tries to  get up slowly.   Past Medical History:  Diagnosis Date  . Cervicalgia   . Chronic kidney disease, stage III (moderate)   . Dermatophytosis of groin and perianal area   . Hyperlipidemia LDL goal < 100   . Insomnia, unspecified   . Muscle weakness (generalized)   . Osteoarthrosis, unspecified whether generalized or localized, lower leg    osteoarthritis - upper thoracic area, left knee.  . Other abnormal glucose   . Other B-complex deficiencies   . Other malaise and fatigue   . Palpitations   . Senile cataract, unspecified   . Shortness of breath   . Stroke [facility](HCC)    small "stroke" - right sided weakness for a day.  . Tachycardia, unspecified   . Undiagnosed cardiac murmurs   . Unspecified essential hypertension   . Unspecified vitamin D deficiency    Past Surgical History:  Procedure Laterality Date  . CATARACT EXTRACTION, BILATERAL Bilateral   . EYE SURGERY     cataract  . TOE AMPUTATION  2002  . TONSILLECTOMY  1944  . TONSILLECTOMY     younger yrs  . TOTAL KNEE ARTHROPLASTY Left 01/15/2015   Procedure: LEFT TOTAL KNEE ARTHROPLASTY;  Surgeon: Eugenia Mcalpineobert Collins, MD;  Location: [facility];  Service: Orthopedics;  Laterality: Left;  . TUBAL LIGATION  1951    Allergies  Allergen Reactions  . Ace Inhibitors Cough  . Lisinopril Cough  . Simvastatin Other (See Comments)    Body aches and pains    Outpatient Encounter Medications as of 02/07/2019  Medication Sig  . carvedilol (COREG) 6.25 MG tablet TAKE 1  TABLET BY MOUTH TWO  TIMES DAILY WITH A MEAL  . clopidogrel (PLAVIX) 75 MG tablet TAKE 1 TABLET BY MOUTH  DAILY WITH BREAKFAST  . Cyanocobalamin (B-12 PO) Take 1 tablet by mouth daily.  . diphenhydramine-acetaminophen (TYLENOL PM) 25-500 MG TABS tablet Take 1 tablet by mouth at bedtime.  . mirtazapine (REMERON) 7.5 MG tablet TAKE 1 TABLET BY MOUTH AT  BEDTIME  . Multiple Vitamins-Minerals (PRESERVISION AREDS 2+MULTI VIT PO) Take 2 capsules by mouth daily.  . phenazopyridine  (PYRIDIUM) 200 MG tablet Take 1 tablet (200 mg total) by mouth 3 (three) times daily as needed for pain.  . traMADol (ULTRAM) 50 MG tablet Take 1 tablet (50 mg total) by mouth every 12 (twelve) hours as needed.  Marland Kitchen VITAMIN D PO Take 1 tablet by mouth daily.  . [DISCONTINUED] cephALEXin (KEFLEX) 250 MG capsule Take 1 capsule (250 mg total) by mouth 3 (three) times daily.   No facility-administered encounter medications on file as of 02/07/2019.     Review of Systems  Constitutional: Positive for chills. Negative for fatigue and fever.       Appetite not good but getting there   Respiratory: Negative for cough, chest tightness, shortness of breath and wheezing.   Cardiovascular: Negative for chest pain, palpitations and leg swelling.  Gastrointestinal: Negative for abdominal distention, constipation, diarrhea, nausea and vomiting.       Lower abdominal pain   Genitourinary: Positive for dysuria, frequency and urgency. Negative for hematuria.  Musculoskeletal: Positive for gait problem. Negative for back pain.  Skin: Negative for color change, pallor and rash.  Neurological: Negative for dizziness and headaches.       No episodes of syncope since ED visit   Psychiatric/Behavioral: The patient is not nervous/anxious.     Immunization History  Administered Date(s) Administered  . Fluad Quad(high Dose 65+) 12/26/2018  . Influenza Split 11/18/2008, 12/28/2009  . Influenza Whole 12/24/2012  . Influenza, High Dose Seasonal PF 12/03/2017  . Influenza,inj,Quad PF,6+ Mos 12/21/2016  . Influenza-Unspecified 01/13/2014, 12/19/2014, 01/15/2016  . PPD Test 01/18/2015  . Pneumococcal Conjugate-13 01/12/2014  . Pneumococcal Polysaccharide-23 05/22/2011  . Tdap 05/22/2011  . Zoster 12/20/2010   Pertinent  Health Maintenance Due  Topic Date Due  . INFLUENZA VACCINE  Completed  . PNA vac Low Risk Adult  Completed  . DEXA SCAN  Addressed   Fall Risk  02/07/2019 02/03/2019 12/26/2018 08/22/2018  07/05/2018  Falls in the past year? 0 0 0 0 0  Number falls in past yr: 0 - 0 0 0  Injury with Fall? 0 - 0 0 0  Risk for fall due to : - - - - -    Vitals:   02/07/19 0908  BP: 130/78  Pulse: 92  Temp: (!) 97.3 F (36.3 C)  TempSrc: Temporal  SpO2: 96%  Weight: 145 lb 6.4 oz (66 kg)  Height: 5\' 7"  (1.702 m)   Body mass index is 22.77 kg/m. Physical Exam Vitals signs reviewed.  Constitutional:      General: She is not in acute distress.    Appearance: She is normal weight. She is not ill-appearing.  HENT:     Head: Normocephalic.  Eyes:     General: No scleral icterus.       Right eye: No discharge.        Left eye: No discharge.     Conjunctiva/sclera: Conjunctivae normal.     Pupils: Pupils are equal, round, and reactive to light.  Cardiovascular:  Rate and Rhythm: Normal rate and regular rhythm.     Pulses: Normal pulses.     Heart sounds: Normal heart sounds. No murmur. No friction rub. No gallop.   Pulmonary:     Effort: Pulmonary effort is normal. No respiratory distress.     Breath sounds: Normal breath sounds. No wheezing, rhonchi or rales.  Chest:     Chest wall: No tenderness.  Abdominal:     General: Bowel sounds are normal. There is no distension.     Palpations: Abdomen is soft.     Tenderness: There is no right CVA tenderness, left CVA tenderness, guarding or rebound.     Comments: suprapubic slight tenderness to palpation.   Genitourinary:    General: Normal vulva.     Comments: In and out catheter done with patient on lithotomy position.urine specimen obtain via sterile procedure.she tolerated procedure well. Amber urine with whitish sediment obtained and send to lab  Musculoskeletal:        General: No swelling or tenderness.     Right lower leg: No edema.     Left lower leg: No edema.     Comments: Unsteady gait uses a cane   Skin:    General: Skin is warm and dry.     Coloration: Skin is not pale.     Findings: No bruising, erythema or  rash.  Neurological:     Mental Status: She is alert.  Psychiatric:        Mood and Affect: Mood normal.        Speech: Speech normal.        Behavior: Behavior normal.        Thought Content: Thought content normal.        Judgment: Judgment normal.    Labs reviewed: Recent Labs    12/26/18 1100 01/04/19 1355  NA 141 138  K 5.1 4.3  CL 105 104  CO2 29 24  GLUCOSE 108 128*  BUN 31* 24*  CREATININE 1.28* 1.23*  CALCIUM 9.2 9.0   Recent Labs    01/04/19 1355  AST 21  ALT 32  ALKPHOS 100  BILITOT 0.9  PROT 7.1  ALBUMIN 4.1   Recent Labs    01/04/19 1355  WBC 11.8*  NEUTROABS 9.5*  HGB 13.3  HCT 41.7  MCV 95.4  PLT 276   Lab Results  Component Value Date   TSH 1.486 07/17/2017   Lab Results  Component Value Date   HGBA1C 5.7 (H) 12/26/2018   Lab Results  Component Value Date   CHOL 205 (H) 04/17/2016   HDL 26 (L) 04/17/2016   LDLCALC 128 (H) 04/17/2016   TRIG 254 (H) 04/17/2016   CHOLHDL 7.9 (H) 04/17/2016    Significant Diagnostic Results in last 30 days:  No results found.  Assessment/Plan  Symptoms of urinary tract infection Afebrile.Has had urine frequency,urgecy,dysuria and lower abdominal tenderness.In and out catheter done with patient on lithotomy position.urine specimen obtain via sterile procedure.she tolerated procedure well. Amber urine with whitish sediment obtained and send to lab  - Culture, Urine - Urinalysis with Reflex Microscopic  Family/ staff Communication: Reviewed plan of care with patient.  Labs/tests ordered:  - Culture, Urine - Urinalysis with Reflex Microscopic  Nelda Bucks , NP

## 2019-02-07 NOTE — Patient Instructions (Signed)
Urinary Tract Infection, Adult A urinary tract infection (UTI) is an infection of any part of the urinary tract. The urinary tract includes:  The kidneys.  The ureters.  The bladder.  The urethra. These organs make, store, and get rid of pee (urine) in the body. What are the causes? This is caused by germs (bacteria) in your genital area. These germs grow and cause swelling (inflammation) of your urinary tract. What increases the risk? You are more likely to develop this condition if:  You have a small, thin tube (catheter) to drain pee.  You cannot control when you pee or poop (incontinence).  You are female, and: ? You use these methods to prevent pregnancy: ? A medicine that kills sperm (spermicide). ? A device that blocks sperm (diaphragm). ? You have low levels of a female hormone (estrogen). ? You are pregnant.  You have genes that add to your risk.  You are sexually active.  You take antibiotic medicines.  You have trouble peeing because of: ? A prostate that is bigger than normal, if you are female. ? A blockage in the part of your body that drains pee from the bladder (urethra). ? A kidney stone. ? A nerve condition that affects your bladder (neurogenic bladder). ? Not getting enough to drink. ? Not peeing often enough.  You have other conditions, such as: ? Diabetes. ? A weak disease-fighting system (immune system). ? Sickle cell disease. ? Gout. ? Injury of the spine. What are the signs or symptoms? Symptoms of this condition include:  Needing to pee right away (urgently).  Peeing often.  Peeing small amounts often.  Pain or burning when peeing.  Blood in the pee.  Pee that smells bad or not like normal.  Trouble peeing.  Pee that is cloudy.  Fluid coming from the vagina, if you are female.  Pain in the belly or lower back. Other symptoms include:  Throwing up (vomiting).  No urge to eat.  Feeling mixed up (confused).  Being tired  and grouchy (irritable).  A fever.  Watery poop (diarrhea). How is this treated? This condition may be treated with:  Antibiotic medicine.  Other medicines.  Drinking enough water. Follow these instructions at home:  Medicines  Take over-the-counter and prescription medicines only as told by your doctor.  If you were prescribed an antibiotic medicine, take it as told by your doctor. Do not stop taking it even if you start to feel better. General instructions  Make sure you: ? Pee until your bladder is empty. ? Do not hold pee for a long time. ? Empty your bladder after sex. ? Wipe from front to back after pooping if you are a female. Use each tissue one time when you wipe.  Drink enough fluid to keep your pee pale yellow.  Keep all follow-up visits as told by your doctor. This is important. Contact a doctor if:  You do not get better after 1-2 days.  Your symptoms go away and then come back. Get help right away if:  You have very bad back pain.  You have very bad pain in your lower belly.  You have a fever.  You are sick to your stomach (nauseous).  You are throwing up. Summary  A urinary tract infection (UTI) is an infection of any part of the urinary tract.  This condition is caused by germs in your genital area.  There are many risk factors for a UTI. These include having a small, thin   tube to drain pee and not being able to control when you pee or poop.  Treatment includes antibiotic medicines for germs.  Drink enough fluid to keep your pee pale yellow. This information is not intended to replace advice given to you by your health care provider. Make sure you discuss any questions you have with your health care provider. Document Released: 08/23/2007 Document Revised: 02/21/2018 Document Reviewed: 09/13/2017 Elsevier Patient Education  2020 Elsevier Inc.  

## 2019-02-08 LAB — URINALYSIS, ROUTINE W REFLEX MICROSCOPIC
Bilirubin Urine: NEGATIVE
Glucose, UA: NEGATIVE
Hgb urine dipstick: NEGATIVE
Hyaline Cast: NONE SEEN /LPF
Ketones, ur: NEGATIVE
Nitrite: POSITIVE — AB
Protein, ur: NEGATIVE
Specific Gravity, Urine: 1.016 (ref 1.001–1.03)
Squamous Epithelial / HPF: NONE SEEN /HPF (ref <=5)
pH: 6.5 (ref 5.0–8.0)

## 2019-02-09 LAB — URINE CULTURE
MICRO NUMBER:: 1127148
Result:: NO GROWTH
SPECIMEN QUALITY:: ADEQUATE

## 2019-02-10 NOTE — Addendum Note (Signed)
Addended by: Gayland Curry on: 02/10/2019 02:47 PM   Modules accepted: Orders

## 2019-02-24 ENCOUNTER — Other Ambulatory Visit: Payer: Self-pay

## 2019-02-24 ENCOUNTER — Encounter: Payer: Self-pay | Admitting: Internal Medicine

## 2019-02-24 ENCOUNTER — Ambulatory Visit (INDEPENDENT_AMBULATORY_CARE_PROVIDER_SITE_OTHER): Payer: Medicare Other | Admitting: Internal Medicine

## 2019-02-24 VITALS — BP 140/82 | HR 73 | Temp 97.1°F | Ht 67.0 in | Wt 139.8 lb

## 2019-02-24 DIAGNOSIS — N904 Leukoplakia of vulva: Secondary | ICD-10-CM | POA: Diagnosis not present

## 2019-02-24 DIAGNOSIS — N390 Urinary tract infection, site not specified: Secondary | ICD-10-CM | POA: Diagnosis not present

## 2019-02-24 NOTE — Patient Instructions (Addendum)
Use your lichen sclerosis cream for 2 wks, then stop again, repeat after another 2 weeks if symptomatic.    Switch to cotton panties and wear loose clothing on the bottom half.    We are still trying to get you into urology.    Try myrbetriq 25mg  daily for your overactive bladder symptoms.

## 2019-02-24 NOTE — Progress Notes (Signed)
Location:  [facility] clinic  Provider: Dr. Bufford Spikes   Advanced Directives 02/24/2019  Does Patient Have a Medical Advance Directive? Yes  Type of Estate agent of [place];Out of facility DNR (pink MOST or yellow form)  Does patient want to make changes to medical advance directive? No - Patient declined  Copy of Healthcare Power of Attorney in Chart? Yes - validated most recent copy scanned in chart (See row information)  Would patient like information on creating a medical advance directive? -  Pre-existing out of facility DNR order (yellow form or pink MOST form) -     Chief Complaint  Patient presents with  . Acute Visit    Patient complains of symptoms of UTI - burning, pain, frequency, nocturia - but produces only drops. Daughter, Olegario Messier, accompanies patient.    HPI: Patient is a 85 y.o. female seen today for medical management of recurrent urinary tract infection.   In the past year, she has had multiple visits related to cystitis and recurrent UTI. She has been treated with antibiotics and a 4 day course of pyridium within the past month.  Two days ago, her symptoms of urinary burning, frequency and bladder pressure returned. She has been trying to schedule a appointment to see urology without success. She denies any fever or cold-like symptoms.   She is drinking only water and cranberry juice to help prevent urinary symptoms.   She is also having increased urinary frequency and urinating at least three times an hour. The frequency has made it hard for her to sleep at night.   Does not wear cotton panties. Will change sanitary pad every time it becomes wet. Does not wear tight clothing  She cannot tell how her lichen sclerosus is doing. She has used her steroid cream last week for about two days.   The lichen sclerosus and recurrent urinary symptoms make it hard for the patient to tell if symptoms are internal or external.   She expresses frustration  and would like to resolve this problem as soon as possible.       Past Medical History:  Diagnosis Date  . Cervicalgia   . Chronic kidney disease, stage III (moderate)   . Dermatophytosis of groin and perianal area   . Hyperlipidemia LDL goal < 100   . Insomnia, unspecified   . Muscle weakness (generalized)   . Osteoarthrosis, unspecified whether generalized or localized, lower leg    osteoarthritis - upper thoracic area, left knee.  . Other abnormal glucose   . Other B-complex deficiencies   . Other malaise and fatigue   . Palpitations   . Senile cataract, unspecified   . Shortness of breath   . Stroke [facility])    small "stroke" - right sided weakness for a day.  . Tachycardia, unspecified   . Undiagnosed cardiac murmurs   . Unspecified essential hypertension   . Unspecified vitamin D deficiency     Past Surgical History:  Procedure Laterality Date  . CATARACT EXTRACTION, BILATERAL Bilateral   . EYE SURGERY     cataract  . TOE AMPUTATION  2002  . TONSILLECTOMY  1944  . TONSILLECTOMY     younger yrs  . TOTAL KNEE ARTHROPLASTY Left 01/15/2015   Procedure: LEFT TOTAL KNEE ARTHROPLASTY;  Surgeon: Eugenia Mcalpine, MD;  Location: [facility];  Service: Orthopedics;  Laterality: Left;  . TUBAL LIGATION  1951    Allergies  Allergen Reactions  . Ace Inhibitors Cough  . Lisinopril Cough  .  Simvastatin Other (See Comments)    Body aches and pains    Outpatient Encounter Medications as of 02/24/2019  Medication Sig  . carvedilol (COREG) 6.25 MG tablet TAKE 1 TABLET BY MOUTH TWO  TIMES DAILY WITH A MEAL  . clopidogrel (PLAVIX) 75 MG tablet TAKE 1 TABLET BY MOUTH  DAILY WITH BREAKFAST  . Cyanocobalamin (B-12 PO) Take 1 tablet by mouth daily.  . diphenhydramine-acetaminophen (TYLENOL PM) 25-500 MG TABS tablet Take 1 tablet by mouth at bedtime.  . mirtazapine (REMERON) 7.5 MG tablet TAKE 1 TABLET BY MOUTH AT  BEDTIME  . Multiple Vitamins-Minerals (PRESERVISION AREDS 2+MULTI VIT PO)  Take 2 capsules by mouth 2 (two) times daily.   . traMADol (ULTRAM) 50 MG tablet Take 1 tablet (50 mg total) by mouth every 12 (twelve) hours as needed.  Marland Kitchen. VITAMIN D PO Take 1 tablet by mouth daily.  . [DISCONTINUED] phenazopyridine (PYRIDIUM) 200 MG tablet Take 1 tablet (200 mg total) by mouth 3 (three) times daily as needed for pain.   No facility-administered encounter medications on file as of 02/24/2019.     Review of Systems:  Review of Systems  Constitutional: Negative for activity change, appetite change and fever.  Genitourinary: Positive for frequency. Negative for dysuria and hematuria.       Urinary burning  Psychiatric/Behavioral: Positive for sleep disturbance. Negative for confusion.    Health Maintenance  Topic Date Due  . TETANUS/TDAP  05/21/2021  . INFLUENZA VACCINE  Completed  . PNA vac Low Risk Adult  Completed  . DEXA SCAN  Addressed    Physical Exam: Vitals:   02/24/19 1148  BP: 140/82  Pulse: 73  Temp: (!) 97.1 F (36.2 C)  TempSrc: Oral  SpO2: 93%  Weight: 139 lb 12.8 oz (63.4 kg)  Height: 5\' 7"  (1.702 m)   Body mass index is 21.9 kg/m. Physical Exam Vitals signs reviewed.  Constitutional:      Appearance: Normal appearance. She is normal weight.  Cardiovascular:     Rate and Rhythm: Normal rate and regular rhythm.     Pulses: Normal pulses.     Heart sounds: Normal heart sounds. No murmur.  Pulmonary:     Effort: Pulmonary effort is normal. No respiratory distress.     Breath sounds: Normal breath sounds. No wheezing.  Abdominal:     General: Abdomen is flat. Bowel sounds are normal.  Neurological:     Mental Status: She is alert.     Labs reviewed: Basic Metabolic Panel: Recent Labs    12/26/18 1100 01/04/19 1355  NA 141 138  K 5.1 4.3  CL 105 104  CO2 29 24  GLUCOSE 108 128*  BUN 31* 24*  CREATININE 1.28* 1.23*  CALCIUM 9.2 9.0   Liver Function Tests: Recent Labs    01/04/19 1355  AST 21  ALT 32  ALKPHOS 100   BILITOT 0.9  PROT 7.1  ALBUMIN 4.1   No results for input(s): LIPASE, AMYLASE in the last 8760 hours. No results for input(s): AMMONIA in the last 8760 hours. CBC: Recent Labs    01/04/19 1355  WBC 11.8*  NEUTROABS 9.5*  HGB 13.3  HCT 41.7  MCV 95.4  PLT 276   Lipid Panel: No results for input(s): CHOL, HDL, LDLCALC, TRIG, CHOLHDL, LDLDIRECT in the last 8760 hours. Lab Results  Component Value Date   HGBA1C 5.7 (H) 12/26/2018    Procedures since last visit: No results found.  Assessment/Plan 1. Recurrent urinary tract infection -  Ambulatory referral to Urology - will trial Myrbetriq 25 mg daily for 2 weeks to help with urinary frequency - contact PCP if myrbetriq improves urinary frequency after 2 weeks - avoid symthetic fiber panties and switch to cotton panties - continue to promote fluids - continue daily cranberry juice  2. Lichen sclerosus of female genitalia - suspect burning is from cystitis or lichen sclerosus - Ambulatory referral to Urology - pulse dose clobatesol cream for two weeks on and then 2 weeks off   Labs/tests ordered:  Urology referral Next appt:  05/01/2019

## 2019-02-25 ENCOUNTER — Other Ambulatory Visit: Payer: Self-pay | Admitting: *Deleted

## 2019-02-25 DIAGNOSIS — I129 Hypertensive chronic kidney disease with stage 1 through stage 4 chronic kidney disease, or unspecified chronic kidney disease: Secondary | ICD-10-CM

## 2019-02-25 MED ORDER — CARVEDILOL 6.25 MG PO TABS: ORAL_TABLET | ORAL | 1 refills | Status: DC

## 2019-02-25 NOTE — Telephone Encounter (Signed)
[facility]

## 2019-02-27 DIAGNOSIS — R350 Frequency of micturition: Secondary | ICD-10-CM | POA: Diagnosis not present

## 2019-02-27 DIAGNOSIS — R300 Dysuria: Secondary | ICD-10-CM | POA: Diagnosis not present

## 2019-02-28 DIAGNOSIS — R300 Dysuria: Secondary | ICD-10-CM | POA: Diagnosis not present

## 2019-03-20 DIAGNOSIS — R350 Frequency of micturition: Secondary | ICD-10-CM | POA: Diagnosis not present

## 2019-03-20 DIAGNOSIS — R300 Dysuria: Secondary | ICD-10-CM | POA: Diagnosis not present

## 2019-03-27 DIAGNOSIS — R300 Dysuria: Secondary | ICD-10-CM | POA: Diagnosis not present

## 2019-03-27 DIAGNOSIS — R3915 Urgency of urination: Secondary | ICD-10-CM | POA: Diagnosis not present

## 2019-03-27 DIAGNOSIS — L900 Lichen sclerosus et atrophicus: Secondary | ICD-10-CM | POA: Diagnosis not present

## 2019-04-07 ENCOUNTER — Other Ambulatory Visit: Payer: Self-pay | Admitting: *Deleted

## 2019-04-07 DIAGNOSIS — H34831 Tributary (branch) retinal vein occlusion, right eye, with macular edema: Secondary | ICD-10-CM | POA: Diagnosis not present

## 2019-04-07 DIAGNOSIS — M1711 Unilateral primary osteoarthritis, right knee: Secondary | ICD-10-CM

## 2019-04-07 DIAGNOSIS — H353111 Nonexudative age-related macular degeneration, right eye, early dry stage: Secondary | ICD-10-CM | POA: Diagnosis not present

## 2019-04-07 DIAGNOSIS — H35351 Cystoid macular degeneration, right eye: Secondary | ICD-10-CM | POA: Diagnosis not present

## 2019-04-07 MED ORDER — TRAMADOL HCL 50 MG PO TABS: 50.0000 mg | ORAL_TABLET | Freq: Two times a day (BID) | ORAL | 1 refills | Status: DC | PRN

## 2019-04-07 NOTE — Telephone Encounter (Signed)
Patient requested refill [place] Database Verified LR: 01/16/2019 Pended Rx and sent to Dr. Renato Gails for approval.

## 2019-04-10 ENCOUNTER — Ambulatory Visit: Payer: Self-pay

## 2019-04-10 DIAGNOSIS — R3915 Urgency of urination: Secondary | ICD-10-CM | POA: Diagnosis not present

## 2019-04-10 DIAGNOSIS — R350 Frequency of micturition: Secondary | ICD-10-CM | POA: Diagnosis not present

## 2019-04-10 DIAGNOSIS — R109 Unspecified abdominal pain: Secondary | ICD-10-CM | POA: Diagnosis not present

## 2019-04-10 DIAGNOSIS — R300 Dysuria: Secondary | ICD-10-CM | POA: Diagnosis not present

## 2019-04-11 DIAGNOSIS — I700 Atherosclerosis of aorta: Secondary | ICD-10-CM | POA: Diagnosis not present

## 2019-04-11 DIAGNOSIS — R109 Unspecified abdominal pain: Secondary | ICD-10-CM | POA: Diagnosis not present

## 2019-04-11 DIAGNOSIS — E278 Other specified disorders of adrenal gland: Secondary | ICD-10-CM | POA: Diagnosis not present

## 2019-04-11 DIAGNOSIS — I77811 Abdominal aortic ectasia: Secondary | ICD-10-CM | POA: Diagnosis not present

## 2019-04-11 DIAGNOSIS — I2510 Atherosclerotic heart disease of native coronary artery without angina pectoris: Secondary | ICD-10-CM | POA: Diagnosis not present

## 2019-04-11 DIAGNOSIS — K828 Other specified diseases of gallbladder: Secondary | ICD-10-CM | POA: Diagnosis not present

## 2019-05-01 ENCOUNTER — Ambulatory Visit: Payer: Self-pay | Admitting: Internal Medicine

## 2019-05-01 ENCOUNTER — Ambulatory Visit: Payer: Medicare Other | Admitting: Internal Medicine

## 2019-05-19 DIAGNOSIS — H3561 Retinal hemorrhage, right eye: Secondary | ICD-10-CM | POA: Diagnosis not present

## 2019-05-19 DIAGNOSIS — H35351 Cystoid macular degeneration, right eye: Secondary | ICD-10-CM | POA: Diagnosis not present

## 2019-05-19 DIAGNOSIS — H34831 Tributary (branch) retinal vein occlusion, right eye, with macular edema: Secondary | ICD-10-CM | POA: Diagnosis not present

## 2019-05-19 DIAGNOSIS — H353111 Nonexudative age-related macular degeneration, right eye, early dry stage: Secondary | ICD-10-CM | POA: Diagnosis not present

## 2019-05-22 ENCOUNTER — Ambulatory Visit: Payer: Medicare Other | Attending: Internal Medicine

## 2019-05-22 DIAGNOSIS — Z23 Encounter for immunization: Secondary | ICD-10-CM | POA: Insufficient documentation

## 2019-05-22 NOTE — Progress Notes (Signed)
   Covid-19 Vaccination Clinic  Name:  Lisa [name]    MRN: [id number] DOB: [birth year]  05/22/2019  Lisa [name] was observed post Covid-19 immunization for 15 minutes without incident. She was provided with Vaccine Information Sheet and instruction to access the V-Safe system.   Lisa [name] was instructed to call 911 with any severe reactions post vaccine: Marland Kitchen Difficulty breathing  . Swelling of face and throat  . A fast heartbeat  . A bad rash all over body  . Dizziness and weakness   Immunizations Administered    Name Date Dose VIS Date Route   Pfizer COVID-19 Vaccine 05/22/2019  5:20 PM 0.3 mL 02/28/2019 Intramuscular   Manufacturer: [employer], [employer]   Lot: [id number]   NDC: [address]-4

## 2019-06-03 DIAGNOSIS — N3000 Acute cystitis without hematuria: Secondary | ICD-10-CM | POA: Diagnosis not present

## 2019-06-03 DIAGNOSIS — R8279 Other abnormal findings on microbiological examination of urine: Secondary | ICD-10-CM | POA: Diagnosis not present

## 2019-06-03 DIAGNOSIS — R9341 Abnormal radiologic findings on diagnostic imaging of renal pelvis, ureter, or bladder: Secondary | ICD-10-CM | POA: Diagnosis not present

## 2019-06-04 ENCOUNTER — Telehealth: Payer: Self-pay

## 2019-06-04 NOTE — Telephone Encounter (Signed)
Patient called and states that she's having fatigue after seeing her Urologist and they advised her to see her PCP. Patient placed on schedule tomorrow 06/05/2019 at 8am.

## 2019-06-04 NOTE — Telephone Encounter (Signed)
Ok thanks 

## 2019-06-05 ENCOUNTER — Other Ambulatory Visit: Payer: Self-pay

## 2019-06-05 ENCOUNTER — Ambulatory Visit (INDEPENDENT_AMBULATORY_CARE_PROVIDER_SITE_OTHER): Payer: Medicare Other | Admitting: Internal Medicine

## 2019-06-05 ENCOUNTER — Encounter: Payer: Self-pay | Admitting: Internal Medicine

## 2019-06-05 VITALS — BP 148/72 | HR 73 | Temp 97.1°F | Ht 67.0 in | Wt 138.0 lb

## 2019-06-05 DIAGNOSIS — Z7189 Other specified counseling: Secondary | ICD-10-CM | POA: Diagnosis not present

## 2019-06-05 DIAGNOSIS — N390 Urinary tract infection, site not specified: Secondary | ICD-10-CM

## 2019-06-05 DIAGNOSIS — G4700 Insomnia, unspecified: Secondary | ICD-10-CM

## 2019-06-05 DIAGNOSIS — I10 Essential (primary) hypertension: Secondary | ICD-10-CM | POA: Diagnosis not present

## 2019-06-05 DIAGNOSIS — M1711 Unilateral primary osteoarthritis, right knee: Secondary | ICD-10-CM

## 2019-06-05 DIAGNOSIS — E538 Deficiency of other specified B group vitamins: Secondary | ICD-10-CM | POA: Diagnosis not present

## 2019-06-05 DIAGNOSIS — R5383 Other fatigue: Secondary | ICD-10-CM | POA: Diagnosis not present

## 2019-06-05 MED ORDER — TRAMADOL HCL 50 MG PO TABS: 50.0000 mg | ORAL_TABLET | Freq: Two times a day (BID) | ORAL | 1 refills | Status: DC | PRN

## 2019-06-05 NOTE — Progress Notes (Addendum)
Location:  [facility] clinic Provider: Aqsa Sensabaugh L. Renato Gails, D.O., C.M.D.  Code Status: DNR Goals of Care:  Advanced Directives 06/05/2019  Does Patient Have a Medical Advance Directive? Yes  Type of Advance Directive Out of facility DNR (pink MOST or yellow form)  Does patient want to make changes to medical advance directive? No - Patient declined  Copy of Healthcare Power of Attorney in Chart? -  Would patient like information on creating a medical advance directive? -  Pre-existing out of facility DNR order (yellow form or pink MOST form) -   Chief Complaint  Patient presents with  . Acute Visit    Fatique     HPI: Patient is a 86 y.o. female seen today for an acute visit for fatigue.    She's been back and forth with UTIs.  Dr. Arita Miss Dr. Marcelle Overlie saw her and found a sore spot, had cystoscopy, then CT.  Did not see anything.   Has appt with [facility] 3/29--urogynecology. In the interim, she was put on an antibiotic again--started last Saturday for the 3 day course.  This last infection knocked her for a loop.    Dr. Arita Miss is the one that treated this.   Symptoms resolved, but she is extremely fatigued and still has urinary frequency of each hour day and night.  She gave her the myrbetriq samples again, but then they called and said she was not emptying fully so not to take it.   Remeron is not doing much good b/c not sleeping b/c has to urinate so once up.  Eating some, but not like usual.  Tries to eat something three times a day and a snack b/w.    Past Medical History:  Diagnosis Date  . Cervicalgia   . Chronic kidney disease, stage III (moderate)   . Dermatophytosis of groin and perianal area   . Hyperlipidemia LDL goal < 100   . Insomnia, unspecified   . Muscle weakness (generalized)   . Osteoarthrosis, unspecified whether generalized or localized, lower leg    osteoarthritis - upper thoracic area, left knee.  . Other abnormal glucose   . Other B-complex deficiencies   . Other  malaise and fatigue   . Palpitations   . Senile cataract, unspecified   . Shortness of breath   . Stroke [facility])    small "stroke" - right sided weakness for a day.  . Tachycardia, unspecified   . Undiagnosed cardiac murmurs   . Unspecified essential hypertension   . Unspecified vitamin D deficiency     Past Surgical History:  Procedure Laterality Date  . CATARACT EXTRACTION, BILATERAL Bilateral   . EYE SURGERY     cataract  . TOE AMPUTATION  2002  . TONSILLECTOMY  1944  . TONSILLECTOMY     younger yrs  . TOTAL KNEE ARTHROPLASTY Left 01/15/2015   Procedure: LEFT TOTAL KNEE ARTHROPLASTY;  Surgeon: Eugenia Mcalpine, MD;  Location: [facility];  Service: Orthopedics;  Laterality: Left;  . TUBAL LIGATION  1951    Allergies  Allergen Reactions  . Ace Inhibitors Cough  . Lisinopril Cough  . Simvastatin Other (See Comments)    Body aches and pains    Outpatient Encounter Medications as of 06/05/2019  Medication Sig  . carvedilol (COREG) 6.25 MG tablet Take one tablet by mouth two times daily with a meal  . clopidogrel (PLAVIX) 75 MG tablet TAKE 1 TABLET BY MOUTH  DAILY WITH BREAKFAST  . Cyanocobalamin (B-12 PO) Take 1 tablet by  mouth daily.  . mirtazapine (REMERON) 7.5 MG tablet TAKE 1 TABLET BY MOUTH AT  BEDTIME  . Multiple Vitamins-Minerals (PRESERVISION AREDS 2+MULTI VIT PO) Take 2 capsules by mouth 2 (two) times daily.   . traMADol (ULTRAM) 50 MG tablet Take 1 tablet (50 mg total) by mouth every 12 (twelve) hours as needed.  Marland Kitchen VITAMIN D PO Take 1 tablet by mouth daily.   No facility-administered encounter medications on file as of 06/05/2019.    Review of Systems:  Review of Systems  Constitutional: Positive for malaise/fatigue and weight loss. Negative for chills and fever.       Had lost 5 lbs end of last year, then lost two more early this year  HENT: Positive for hearing loss.   Eyes: Negative for blurred vision.  Respiratory: Positive for shortness of breath. Negative for  cough.   Cardiovascular: Positive for leg swelling. Negative for chest pain and palpitations.  Gastrointestinal: Negative for abdominal pain, blood in stool, constipation, diarrhea and melena.  Genitourinary: Positive for frequency and urgency. Negative for dysuria, flank pain and hematuria.  Musculoskeletal: Positive for joint pain and neck pain. Negative for falls.  Neurological: Positive for weakness. Negative for dizziness and loss of consciousness.  Endo/Heme/Allergies: Bruises/bleeds easily.  Psychiatric/Behavioral: Negative for depression and memory loss. The patient has insomnia. The patient is not nervous/anxious.        Not sleeping due to her urinary frequency    Health Maintenance  Topic Date Due  . TETANUS/TDAP  05/21/2021  . INFLUENZA VACCINE  Completed  . PNA vac Low Risk Adult  Completed  . DEXA SCAN  Addressed    Physical Exam: Vitals:   06/05/19 0809  BP: (!) 148/72  Pulse: 73  Temp: (!) 97.1 F (36.2 C)  TempSrc: Temporal  SpO2: 93%  Weight: 138 lb (62.6 kg)  Height: 5\' 7"  (1.702 m)   Body mass index is 21.61 kg/m. Physical Exam Vitals reviewed.  Constitutional:      Appearance: Normal appearance.     Comments: Lost weight in upper body  Eyes:     Pupils: Pupils are equal, round, and reactive to light.     Comments: glasses  Cardiovascular:     Rate and Rhythm: Normal rate and regular rhythm.     Pulses: Normal pulses.     Heart sounds: Normal heart sounds.  Pulmonary:     Effort: Pulmonary effort is normal.     Breath sounds: Normal breath sounds. No wheezing, rhonchi or rales.  Abdominal:     General: Bowel sounds are normal. There is no distension.     Palpations: Abdomen is soft. There is no mass.     Tenderness: There is no guarding or rebound.     Comments: Slight RLQ tenderness near groin   Musculoskeletal:        General: Normal range of motion.     Right lower leg: Edema present.     Left lower leg: Edema present.  Skin:     General: Skin is warm and dry.     Coloration: Skin is pale.  Neurological:     General: No focal deficit present.     Mental Status: She is alert and oriented to person, place, and time. Mental status is at baseline.     Comments: Walks with cane  Psychiatric:        Mood and Affect: Mood normal.        Behavior: Behavior normal.  Thought Content: Thought content normal.        Judgment: Judgment normal.     Labs reviewed: Basic Metabolic Panel: Recent Labs    12/26/18 1100 01/04/19 1355  NA 141 138  K 5.1 4.3  CL 105 104  CO2 29 24  GLUCOSE 108 128*  BUN 31* 24*  CREATININE 1.28* 1.23*  CALCIUM 9.2 9.0   Liver Function Tests: Recent Labs    01/04/19 1355  AST 21  ALT 32  ALKPHOS 100  BILITOT 0.9  PROT 7.1  ALBUMIN 4.1   No results for input(s): LIPASE, AMYLASE in the last 8760 hours. No results for input(s): AMMONIA in the last 8760 hours. CBC: Recent Labs    01/04/19 1355  WBC 11.8*  NEUTROABS 9.5*  HGB 13.3  HCT 41.7  MCV 95.4  PLT 276   Lipid Panel: No results for input(s): CHOL, HDL, LDLCALC, TRIG, CHOLHDL, LDLDIRECT in the last 8760 hours. Lab Results  Component Value Date   HGBA1C 5.7 (H) 12/26/2018     Assessment/Plan 1. Primary osteoarthritis of right knee - requests refill of tramadol but moreso for her neck at this time which is progressively worse - traMADol (ULTRAM) 50 MG tablet; Take 1 tablet (50 mg total) by mouth every 12 (twelve) hours as needed.  Dispense: 180 tablet; Refill: 1  2. Insomnia, unspecified type -suspect primarily due to her nocturia -check labs to ensure no other explanation for fatigue (anemia, dehydration, abnormal lytes, etc)--if not, may put her on a sleeping pill at this point b/c she is so frustrated and just wants to rest and have better QOL  3. Other fatigue - CBC with Differential/Platelet - COMPLETE METABOLIC PANEL WITH GFR - TSH - Vitamin B12 -add protein supplement like premier, boost or  ensure and eat frequent small meals, hydrate  4. Recurrent UTI - now has [facility] urology appt for further eval after seeing urology and gyn without explanation outside of mild incomplete bladder emptying -she has never been good at drinking water though reportedly she is now - CBC with Differential/Platelet  5.  Goals of care counseling/discussion -pt would like to live to meet her great great grandchild this summer and her great granddaughter's wedding in [place] -she reports she does not want to keep living if she has to live with her ongoing urinary frequency with poor sleep and recurrent infections, extreme fatigue -she does not seem depressed and remeron did not help her -25 mins spent on goals of care discussion which is documented in vynca  Labs/tests ordered:   Lab Orders     CBC with Differential/Platelet     COMPLETE METABOLIC PANEL WITH GFR     TSH     Vitamin B12  Next appt:  06/26/2019  Kristyne Woodring L. Sheril Hammond, D.O. [facility] [address], [place] [address] Cell Phone (Mon-Fri 8am-5pm):  [phone] On Call:  [phone] & follow prompts after 5pm & weekends Office Phone:  [phone] Office Fax:  [phone]

## 2019-06-06 LAB — CBC WITH DIFFERENTIAL/PLATELET
Absolute Monocytes: 309 cells/uL (ref 200–950)
Basophils Absolute: 57 cells/uL (ref 0–200)
Basophils Relative: 0.9 %
Eosinophils Absolute: 120 cells/uL (ref 15–500)
Eosinophils Relative: 1.9 %
HCT: 40.2 % (ref 35.0–45.0)
Hemoglobin: 13.2 g/dL (ref 11.7–15.5)
Lymphs Abs: 1537 cells/uL (ref 850–3900)
MCH: 30.1 pg (ref 27.0–33.0)
MCHC: 32.8 g/dL (ref 32.0–36.0)
MCV: 91.6 fL (ref 80.0–100.0)
MPV: 10.2 fL (ref 7.5–12.5)
Monocytes Relative: 4.9 %
Neutro Abs: 4278 cells/uL (ref 1500–7800)
Neutrophils Relative %: 67.9 %
Platelets: 277 10*3/uL (ref 140–400)
RBC: 4.39 10*6/uL (ref 3.80–5.10)
RDW: 13 % (ref 11.0–15.0)
Total Lymphocyte: 24.4 %
WBC: 6.3 10*3/uL (ref 3.8–10.8)

## 2019-06-06 LAB — TSH: TSH: 2.44 mIU/L (ref 0.40–4.50)

## 2019-06-06 LAB — COMPLETE METABOLIC PANEL WITH GFR
AG Ratio: 1.7 (calc) (ref 1.0–2.5)
ALT: 10 U/L (ref 6–29)
AST: 12 U/L (ref 10–35)
Albumin: 4 g/dL (ref 3.6–5.1)
Alkaline phosphatase (APISO): 94 U/L (ref 37–153)
BUN/Creatinine Ratio: 24 (calc) — ABNORMAL HIGH (ref 6–22)
BUN: 27 mg/dL — ABNORMAL HIGH (ref 7–25)
CO2: 27 mmol/L (ref 20–32)
Calcium: 9.2 mg/dL (ref 8.6–10.4)
Chloride: 107 mmol/L (ref 98–110)
Creat: 1.14 mg/dL — ABNORMAL HIGH (ref 0.60–0.88)
GFR, Est African American: 47 mL/min/{1.73_m2} — ABNORMAL LOW (ref >=60)
GFR, Est Non African American: 41 mL/min/{1.73_m2} — ABNORMAL LOW (ref >=60)
Globulin: 2.4 g/dL (calc) (ref 1.9–3.7)
Glucose, Bld: 109 mg/dL — ABNORMAL HIGH (ref 65–99)
Potassium: 4.2 mmol/L (ref 3.5–5.3)
Sodium: 142 mmol/L (ref 135–146)
Total Bilirubin: 0.6 mg/dL (ref 0.2–1.2)
Total Protein: 6.4 g/dL (ref 6.1–8.1)

## 2019-06-06 LAB — VITAMIN B12: Vitamin B-12: >2000 pg/mL — ABNORMAL HIGH (ref 200–1100)

## 2019-06-06 NOTE — Progress Notes (Signed)
All of the labs returned totally normal or stable. We had discussed that if that's the case, her fatigue is likely due to her poor sleep from urinary frequency and being unable to go back to bed.  She wanted to take a sleeping pill and we already reviewed the risks of this, but she wants it to improve her quality of life:  dayvigo 5mg  at bedtime.  Let's see if her daughter can pick up a box of 10 for her to try.  It may take 3 nights before she notices she rests better.

## 2019-06-16 DIAGNOSIS — N390 Urinary tract infection, site not specified: Secondary | ICD-10-CM | POA: Diagnosis not present

## 2019-06-16 DIAGNOSIS — R82998 Other abnormal findings in urine: Secondary | ICD-10-CM | POA: Diagnosis not present

## 2019-06-18 ENCOUNTER — Ambulatory Visit: Payer: Medicare Other | Attending: Internal Medicine

## 2019-06-18 DIAGNOSIS — Z23 Encounter for immunization: Secondary | ICD-10-CM

## 2019-06-18 NOTE — Progress Notes (Signed)
   Covid-19 Vaccination Clinic  Name:  Lisa [name]    MRN: [id number] DOB: July 16, 1921  06/18/2019  [name] was observed post Covid-19 immunization for 15 minutes without incident. She was provided with Vaccine Information Sheet and instruction to access the V-Safe system.   [name] was instructed to call 911 with any severe reactions post vaccine: Marland Kitchen Difficulty breathing  . Swelling of face and throat  . A fast heartbeat  . A bad rash all over body  . Dizziness and weakness   Immunizations Administered    Name Date Dose VIS Date Route   Pfizer COVID-19 Vaccine 06/18/2019  8:48 AM 0.3 mL 02/28/2019 Intramuscular   Manufacturer: [employer], [employer]   Lot: [id number]   NDC: [address]-3

## 2019-06-26 ENCOUNTER — Ambulatory Visit (INDEPENDENT_AMBULATORY_CARE_PROVIDER_SITE_OTHER): Payer: Medicare Other | Admitting: Internal Medicine

## 2019-06-26 ENCOUNTER — Encounter: Payer: Self-pay | Admitting: Internal Medicine

## 2019-06-26 ENCOUNTER — Other Ambulatory Visit: Payer: Self-pay

## 2019-06-26 VITALS — BP 138/82 | HR 76 | Temp 97.5°F | Ht 67.0 in | Wt 124.6 lb

## 2019-06-26 DIAGNOSIS — N958 Other specified menopausal and perimenopausal disorders: Secondary | ICD-10-CM

## 2019-06-26 DIAGNOSIS — N390 Urinary tract infection, site not specified: Secondary | ICD-10-CM

## 2019-06-26 DIAGNOSIS — R5383 Other fatigue: Secondary | ICD-10-CM | POA: Diagnosis not present

## 2019-06-26 DIAGNOSIS — N904 Leukoplakia of vulva: Secondary | ICD-10-CM

## 2019-06-26 NOTE — Progress Notes (Signed)
Location:  [facility] clinic Provider:  Jassmine Vandruff L. Mariea Clonts, D.O., C.M.D.  Code Status: DNR Goals of Care:  Advanced Directives 06/26/2019  Does Patient Have a Medical Advance Directive? Yes  Type of Advance Directive Out of facility DNR (pink MOST or yellow form);Healthcare Power of Attorney  Does patient want to make changes to medical advance directive? No - Patient declined  Copy of [place] in Chart? -  Would patient like information on creating a medical advance directive? -  Pre-existing out of facility DNR order (yellow form or pink MOST form) Pink MOST/Yellow Form most recent copy in chart - Physician notified to receive inpatient order     Chief Complaint  Patient presents with  . Medical Management of Chronic Issues    4 month follow up     HPI: Patient is a 86 y.o. female seen today for medical management of chronic diseases.    Monday, sick on her stomach, threw up overnight.   101.5 Tuesday night and vomiting.  Fine since. Had last covid shot on 3/31.    She started the myrbetriq that I'd given her before.    She went to the doctor at [facility] about her bladder.  Dr. Landry Mellow.    genitourinary syndrome of menopause of pelvic organs--she is now back on vaginal estrogen daily for 3 weeks, then qod for 3 weeks.  Is feeling much better.  The atrophy is apparently affecting her urinary bladder, urethra and the vaginal area.  She is back to taking cranberry supplementation per their recommendation.    Requests information about home care agencies to help with cleaning, errands, etc.  Past Medical History:  Diagnosis Date  . Cervicalgia   . Chronic kidney disease, stage III (moderate)   . Dermatophytosis of groin and perianal area   . Hyperlipidemia LDL goal < 100   . Insomnia, unspecified   . Muscle weakness (generalized)   . Osteoarthrosis, unspecified whether generalized or localized, lower leg    osteoarthritis - upper thoracic area, left knee.    . Other abnormal glucose   . Other B-complex deficiencies   . Other malaise and fatigue   . Palpitations   . Senile cataract, unspecified   . Shortness of breath   . Stroke [facility])    small "stroke" - right sided weakness for a day.  . Tachycardia, unspecified   . Undiagnosed cardiac murmurs   . Unspecified essential hypertension   . Unspecified vitamin D deficiency     Past Surgical History:  Procedure Laterality Date  . CATARACT EXTRACTION, BILATERAL Bilateral   . EYE SURGERY     cataract  . TOE AMPUTATION  2002  . TONSILLECTOMY  1944  . TONSILLECTOMY     younger yrs  . TOTAL KNEE ARTHROPLASTY Left 01/15/2015   Procedure: LEFT TOTAL KNEE ARTHROPLASTY;  Surgeon: Sydnee Cabal, MD;  Location: [facility];  Service: Orthopedics;  Laterality: Left;  . TUBAL LIGATION  1951    Allergies  Allergen Reactions  . Ace Inhibitors Cough  . Lisinopril Cough  . Simvastatin Other (See Comments)    Body aches and pains    Outpatient Encounter Medications as of 06/26/2019  Medication Sig  . carvedilol (COREG) 6.25 MG tablet Take one tablet by mouth two times daily with a meal  . clopidogrel (PLAVIX) 75 MG tablet TAKE 1 TABLET BY MOUTH  DAILY WITH BREAKFAST  . Cyanocobalamin (B-12 PO) Take 1 tablet by mouth daily.  . Multiple Vitamins-Minerals (PRESERVISION  AREDS 2+MULTI VIT PO) Take 2 capsules by mouth 2 (two) times daily.   . traMADol (ULTRAM) 50 MG tablet Take 1 tablet (50 mg total) by mouth every 12 (twelve) hours as needed.  Marland Kitchen VITAMIN D PO Take 1 tablet by mouth daily.   No facility-administered encounter medications on file as of 06/26/2019.    Review of Systems:  Review of Systems  Constitutional: Positive for malaise/fatigue. Negative for chills and fever.       Malaise improving gradually   HENT: Positive for hearing loss. Negative for congestion and sore throat.   Eyes: Negative for blurred vision.  Respiratory: Negative for cough and shortness of breath.   Cardiovascular:  Negative for chest pain, palpitations and leg swelling.  Gastrointestinal: Positive for nausea and vomiting. Negative for abdominal pain, blood in stool, constipation, diarrhea and melena.  Genitourinary: Negative for dysuria, frequency and urgency.       Improved with estrace cream per gyn  Musculoskeletal: Positive for joint pain. Negative for falls.  Neurological: Negative for dizziness and loss of consciousness.  Endo/Heme/Allergies: Bruises/bleeds easily.  Psychiatric/Behavioral: Negative for depression and memory loss. The patient is not nervous/anxious and does not have insomnia.        Spirits and will to live improved now with better rest and less urinary frequency    Health Maintenance  Topic Date Due  . INFLUENZA VACCINE  10/19/2019  . TETANUS/TDAP  05/21/2021  . PNA vac Low Risk Adult  Completed  . DEXA SCAN  Addressed    Physical Exam: Vitals:   06/26/19 1004  BP: 138/82  Pulse: 76  Temp: (!) 97.5 F (36.4 C)  TempSrc: Temporal  SpO2: 97%  Weight: 124 lb 9.6 oz (56.5 kg)  Height: 5\' 7"  (1.702 m)   Body mass index is 19.52 kg/m. Physical Exam Vitals reviewed.  Constitutional:      General: She is not in acute distress.    Appearance: Normal appearance. She is not toxic-appearing.  HENT:     Head: Normocephalic and atraumatic.  Cardiovascular:     Rate and Rhythm: Normal rate and regular rhythm.     Pulses: Normal pulses.  Pulmonary:     Effort: Pulmonary effort is normal.     Breath sounds: Normal breath sounds. No wheezing, rhonchi or rales.  Abdominal:     General: Bowel sounds are normal.     Palpations: Abdomen is soft.     Tenderness: There is no abdominal tenderness.  Musculoskeletal:        General: Normal range of motion.     Right lower leg: No edema.     Left lower leg: No edema.  Skin:    General: Skin is warm and dry.  Neurological:     General: No focal deficit present.     Mental Status: She is alert and oriented to person, place,  and time. Mental status is at baseline.     Motor: Weakness present.     Gait: Gait abnormal.     Comments: Uses cane  Psychiatric:        Mood and Affect: Mood normal.        Behavior: Behavior normal.        Thought Content: Thought content normal.        Judgment: Judgment normal.     Labs reviewed: Basic Metabolic Panel: Recent Labs    12/26/18 1100 01/04/19 1355 06/05/19 0901  NA 141 138 142  K 5.1 4.3 4.2  CL  105 104 107  CO2 29 24 27   GLUCOSE 108 128* 109*  BUN 31* 24* 27*  CREATININE 1.28* 1.23* 1.14*  CALCIUM 9.2 9.0 9.2  TSH  --   --  2.44   Liver Function Tests: Recent Labs    01/04/19 1355 06/05/19 0901  AST 21 12  ALT 32 10  ALKPHOS 100  --   BILITOT 0.9 0.6  PROT 7.1 6.4  ALBUMIN 4.1  --    No results for input(s): LIPASE, AMYLASE in the last 8760 hours. No results for input(s): AMMONIA in the last 8760 hours. CBC: Recent Labs    01/04/19 1355 06/05/19 0901  WBC 11.8* 6.3  NEUTROABS 9.5* 4,278  HGB 13.3 13.2  HCT 41.7 40.2  MCV 95.4 91.6  PLT 276 277   Lipid Panel: No results for input(s): CHOL, HDL, LDLCALC, TRIG, CHOLHDL, LDLDIRECT in the last 8760 hours. Lab Results  Component Value Date   HGBA1C 5.7 (H) 12/26/2018   Assessment/Plan 1. Genitourinary syndrome of menopause -has seen urogyn with this as formal diagnosis affecting her female organs and urinary tract not just atrophic vaginitis--I had been hesitant to have her use the estrace due to her prior dx from a gyn of lichen sclerosis (for fear of progression to malignancy); however, when she had her last pelvic exam, this was not seen  -she is doing well back on the myrbetriq I'd tried her on before along with the estrace  2. Lichen sclerosus of female genitalia -not active per gyn visit  3. Recurrent urinary tract infection -resolved as of this time  4. Fatigue, unspecified type -improved with better rest now on myrbetriq and estrace cream for #1  Labs/tests ordered:     Lab Orders  No laboratory test(s) ordered today   Given blank MOST to review with her daughter for end-of-life planning purposes.  Fortunately, she's doing a lot better now than last time  Next appt:  07/08/2019   Jamyron Redd L. Blue Winther, D.O. Geriatrics 07/10/2019 [facility] [address], [place] Cell Phone (Mon-Fri 8am-5pm):  [phone] On Call:  [phone] & follow prompts after 5pm & weekends Office Phone:  [phone] Office Fax:  [phone]

## 2019-06-30 ENCOUNTER — Encounter (INDEPENDENT_AMBULATORY_CARE_PROVIDER_SITE_OTHER): Payer: Medicare Other | Admitting: Ophthalmology

## 2019-07-01 DIAGNOSIS — N958 Other specified menopausal and perimenopausal disorders: Secondary | ICD-10-CM | POA: Insufficient documentation

## 2019-07-02 ENCOUNTER — Other Ambulatory Visit: Payer: Self-pay | Admitting: *Deleted

## 2019-07-02 DIAGNOSIS — I129 Hypertensive chronic kidney disease with stage 1 through stage 4 chronic kidney disease, or unspecified chronic kidney disease: Secondary | ICD-10-CM

## 2019-07-02 MED ORDER — CARVEDILOL 6.25 MG PO TABS: ORAL_TABLET | ORAL | 1 refills | Status: DC

## 2019-07-02 NOTE — Telephone Encounter (Signed)
[facility] 

## 2019-07-07 ENCOUNTER — Other Ambulatory Visit: Payer: Self-pay

## 2019-07-07 ENCOUNTER — Ambulatory Visit (INDEPENDENT_AMBULATORY_CARE_PROVIDER_SITE_OTHER): Payer: Medicare Other | Admitting: Ophthalmology

## 2019-07-07 ENCOUNTER — Encounter (INDEPENDENT_AMBULATORY_CARE_PROVIDER_SITE_OTHER): Payer: Self-pay | Admitting: Ophthalmology

## 2019-07-07 DIAGNOSIS — H348312 Tributary (branch) retinal vein occlusion, right eye, stable: Secondary | ICD-10-CM | POA: Insufficient documentation

## 2019-07-07 DIAGNOSIS — H34831 Tributary (branch) retinal vein occlusion, right eye, with macular edema: Secondary | ICD-10-CM | POA: Diagnosis not present

## 2019-07-07 DIAGNOSIS — H353222 Exudative age-related macular degeneration, left eye, with inactive choroidal neovascularization: Secondary | ICD-10-CM

## 2019-07-07 DIAGNOSIS — H35351 Cystoid macular degeneration, right eye: Secondary | ICD-10-CM | POA: Diagnosis not present

## 2019-07-07 DIAGNOSIS — H348311 Tributary (branch) retinal vein occlusion, right eye, with retinal neovascularization: Secondary | ICD-10-CM | POA: Diagnosis not present

## 2019-07-07 MED ORDER — BEVACIZUMAB CHEMO INJECTION 1.25MG/0.05ML SYRINGE FOR KALEIDOSCOPE
1.2500 mg | INTRAVITREAL | Status: AC | PRN
  Administered 2019-07-07: 1.25 mg via INTRAVITREAL

## 2019-07-07 NOTE — Progress Notes (Signed)
07/07/2019     CHIEF COMPLAINT Patient presents for Retina Follow Up   HISTORY OF PRESENT ILLNESS: Lisa [name] is a 86 y.o. female who presents to the clinic today for:   HPI    Retina Follow Up    Patient presents with  Wet AMD.  In right eye.  Severity is moderate.  Since onset it is stable.  I, the attending physician,  performed the HPI with the patient and updated documentation appropriately.          Comments    7 Week AMD f\u OD. Possible Avastin OD. OCT  Pt states vision is about the same. Pt is still having occasional blurry vision.       Last edited by Tilda Franco on 07/07/2019  9:03 AM. (History)      Referring physician: Gayland Curry, DO [place],  [place] [id number]  HISTORICAL INFORMATION:   Selected notes from the MEDICAL RECORD NUMBER    Lab Results  Component Value Date   HGBA1C 5.7 (H) 12/26/2018     CURRENT MEDICATIONS: No current outpatient medications on file. (Ophthalmic Drugs)   No current facility-administered medications for this visit. (Ophthalmic Drugs)   Current Outpatient Medications (Other)  Medication Sig  . carvedilol (COREG) 6.25 MG tablet Take one tablet by mouth two times daily with a meal  . clopidogrel (PLAVIX) 75 MG tablet TAKE 1 TABLET BY MOUTH  DAILY WITH BREAKFAST  . Cyanocobalamin (B-12 PO) Take 1 tablet by mouth daily.  . Multiple Vitamins-Minerals (PRESERVISION AREDS 2+MULTI VIT PO) Take 2 capsules by mouth 2 (two) times daily.   . traMADol (ULTRAM) 50 MG tablet Take 1 tablet (50 mg total) by mouth every 12 (twelve) hours as needed.  Marland Kitchen VITAMIN D PO Take 1 tablet by mouth daily.   No current facility-administered medications for this visit. (Other)      REVIEW OF SYSTEMS:    ALLERGIES Allergies  Allergen Reactions  . Ace Inhibitors Cough  . Lisinopril Cough  . Simvastatin Other (See Comments)    Body aches and pains    PAST MEDICAL HISTORY Past Medical History:  Diagnosis Date   . Cervicalgia   . Chronic kidney disease, stage III (moderate)   . Dermatophytosis of groin and perianal area   . Hyperlipidemia LDL goal < 100   . Insomnia, unspecified   . Muscle weakness (generalized)   . Osteoarthrosis, unspecified whether generalized or localized, lower leg    osteoarthritis - upper thoracic area, left knee.  . Other abnormal glucose   . Other B-complex deficiencies   . Other malaise and fatigue   . Palpitations   . Senile cataract, unspecified   . Shortness of breath   . Stroke [facility])    small "stroke" - right sided weakness for a day.  . Tachycardia, unspecified   . Undiagnosed cardiac murmurs   . Unspecified essential hypertension   . Unspecified vitamin D deficiency    Past Surgical History:  Procedure Laterality Date  . CATARACT EXTRACTION W/PHACO Bilateral 1994   Dr. Katy Fitch  . CATARACT EXTRACTION, BILATERAL Bilateral   . EYE SURGERY     cataract  . TOE AMPUTATION  2002  . TONSILLECTOMY  1944  . TONSILLECTOMY     younger yrs  . TOTAL KNEE ARTHROPLASTY Left 01/15/2015   Procedure: LEFT TOTAL KNEE ARTHROPLASTY;  Surgeon: Sydnee Cabal, MD;  Location: [facility];  Service: Orthopedics;  Laterality: Left;  . TUBAL  LIGATION  1951    FAMILY HISTORY Family History  Problem Relation Age of Onset  . Cerebral aneurysm Mother   . Heart attack Father   . Alzheimer's disease Sister   . Emphysema Sister   . Stroke Sister   . Alzheimer's disease Sister   . Arthritis Sister   . Stroke Sister        Age 55   . Lung cancer Son     SOCIAL HISTORY Social History   Tobacco Use  . Smoking status: Never Smoker  . Smokeless tobacco: Never Used  Substance Use Topics  . Alcohol use: Yes    Comment: rare social every 3 or 4 months wine  . Drug use: No         OPHTHALMIC EXAM: Base Eye Exam    Visual Acuity (Snellen - Linear)      Right Left   Dist cc 20/50 -2 20/100 -1   Dist ph cc NI 20/80   Correction: Glasses       Tonometry (Tonopen, 9:10  AM)      Right Left   Pressure 7 6       Pupils      Pupils Dark Light Shape React APD   Right PERRL 3 2.5 Round Sluggish None   Left PERRL 3 2.5 Round Sluggish None       Visual Fields (Counting fingers)      Left Right    Full Full       Neuro/Psych    Oriented x3: Yes   Mood/Affect: Normal       Dilation    Right eye: 1.0% Mydriacyl, 2.5% Phenylephrine @ 9:09 AM          IMAGING AND PROCEDURES  Imaging and Procedures for 07/07/19           ASSESSMENT/PLAN:  No problem-specific Assessment & Plan notes found for this encounter.      ICD-10-CM   1. Branch retinal vein occlusion with macular edema of right eye  H34.8310 OCT, Retina - OU - Both Eyes  2. Branch retinal vein occlusion with neovascularization of right eye  H34.8311   3. Cystoid macular edema of right eye  H35.351   4. Exudative age-related macular degeneration of left eye with inactive choroidal neovascularization (HCC)  H35.3222 OCT, Retina - OU - Both Eyes    1.  2.  3.  Ophthalmic Meds Ordered this visit:  No orders of the defined types were placed in this encounter.      No follow-ups on file.  There are no Patient Instructions on file for this visit.   Explained the diagnoses, plan, and follow up with the patient and they expressed understanding.  Patient expressed understanding of the importance of proper follow up care.   Alford Highland Lysle Yero M.D. Diseases & Surgery of the Retina and Vitreous [facility] 07/07/19     Abbreviations: M myopia (nearsighted); A astigmatism; H hyperopia (farsighted); P presbyopia; Mrx spectacle prescription;  CTL contact lenses; OD right eye; OS left eye; OU both eyes  XT exotropia; ET esotropia; PEK punctate epithelial keratitis; PEE punctate epithelial erosions; DES dry eye syndrome; MGD meibomian gland dysfunction; ATs artificial tears; PFAT's preservative free artificial tears; NSC nuclear sclerotic cataract; PSC posterior  subcapsular cataract; ERM epi-retinal membrane; PVD posterior vitreous detachment; RD retinal detachment; DM diabetes mellitus; DR diabetic retinopathy; NPDR non-proliferative diabetic retinopathy; PDR proliferative diabetic retinopathy; CSME clinically significant macular edema; DME diabetic macular edema;  dbh dot blot hemorrhages; CWS cotton wool spot; POAG primary open angle glaucoma; C/D cup-to-disc ratio; HVF humphrey visual field; GVF goldmann visual field; OCT optical coherence tomography; IOP intraocular pressure; BRVO Branch retinal vein occlusion; CRVO central retinal vein occlusion; CRAO central retinal artery occlusion; BRAO branch retinal artery occlusion; RT retinal tear; SB scleral buckle; PPV pars plana vitrectomy; VH Vitreous hemorrhage; PRP panretinal laser photocoagulation; IVK intravitreal kenalog; VMT vitreomacular traction; MH Macular hole;  NVD neovascularization of the disc; NVE neovascularization elsewhere; AREDS age related eye disease study; ARMD age related macular degeneration; POAG primary open angle glaucoma; EBMD epithelial/anterior basement membrane dystrophy; ACIOL anterior chamber intraocular lens; IOL intraocular lens; PCIOL posterior chamber intraocular lens; Phaco/IOL phacoemulsification with intraocular lens placement; [place] photorefractive keratectomy; LASIK laser assisted in situ keratomileusis; HTN hypertension; DM diabetes mellitus; COPD chronic obstructive pulmonary disease

## 2019-07-07 NOTE — Assessment & Plan Note (Signed)
The nature of branch retinal vein occlusion with macular edema was discussed.  The patient was given access to printed information.  The treatment options including continued observation looking for spontaneous resolution versus grid laser versus intravitreal Kenalog injection were discussed.  PRIMARY THERAPY CONSISTS of Anti-VEGF Therapies, AVASTIN, LUCENTIS AND EYLEA.  Their usage was discussed to assist in halting the progression of Macular Edema, in order to preserve, protect or improve acuity.  Additionally, at times, limited focal laser therapy is used in the management.  The risks and benefits of all these options were discussed with the patient.  The patient's questions were answered.The nature of branch retinal vein occlusion with macular edema was discussed.  The patient was given access to printed information.  The treatment options including continued observation looking for spontaneous resolution versus grid laser versus intravitreal Kenalog injection were discussed.  PRIMARY THERAPY CONSISTS of Anti-VEGF Therapies, AVASTIN, LUCENTIS AND EYLEA.  Their usage was discussed to assist in halting the progression of Macular Edema, in order to preserve, protect or improve acuity.  Additionally, at times, limited focal laser therapy is used in the management.  The risks and benefits of all these options were discussed with the patient.  The patient's questions were answered.

## 2019-07-08 ENCOUNTER — Encounter: Payer: Self-pay | Admitting: Nurse Practitioner

## 2019-07-08 ENCOUNTER — Ambulatory Visit (INDEPENDENT_AMBULATORY_CARE_PROVIDER_SITE_OTHER): Payer: Medicare Other | Admitting: Nurse Practitioner

## 2019-07-08 ENCOUNTER — Other Ambulatory Visit: Payer: Self-pay

## 2019-07-08 DIAGNOSIS — Z0000 Encounter for general adult medical examination without abnormal findings: Secondary | ICD-10-CM | POA: Diagnosis not present

## 2019-07-08 NOTE — Patient Instructions (Signed)
Lisa [name] , Thank you for taking time to come for your Medicare Wellness Visit. I appreciate your ongoing commitment to your health goals. Please review the following plan we discussed and let me know if I can assist you in the future.   Screening recommendations/referrals: Colonoscopy aged out Mammogram aged out Bone Density  You have declined this Recommended yearly ophthalmology/optometry visit for glaucoma screening and checkup Recommended yearly dental visit for hygiene and checkup  Vaccinations: Influenza vaccine up to date Pneumococcal vaccine up to date Tdap vaccine up to date Shingles vaccine recommended to get shingrix at your local pharmacy  Advanced directives: on file  Conditions/risks identified: weight loss, make sure to get adequate calories and food with good nutritional value.   Next appointment: 1 year   Preventive Care 84 Years and Older, Female Preventive care refers to lifestyle choices and visits with your health care provider that can promote health and wellness. What does preventive care include?  A yearly physical exam. This is also called an annual well check.  Dental exams once or twice a year.  Routine eye exams. Ask your health care provider how often you should have your eyes checked.  Personal lifestyle choices, including:  Daily care of your teeth and gums.  Regular physical activity.  Eating a healthy diet.  Avoiding tobacco and drug use.  Limiting alcohol use.  Practicing safe sex.  Taking low-dose aspirin every day.  Taking vitamin and mineral supplements as recommended by your health care provider. What happens during an annual well check? The services and screenings done by your health care provider during your annual well check will depend on your age, overall health, lifestyle risk factors, and family history of disease. Counseling  Your health care provider may ask you questions about your:  Alcohol use.  Tobacco  use.  Drug use.  Emotional well-being.  Home and relationship well-being.  Sexual activity.  Eating habits.  History of falls.  Memory and ability to understand (cognition).  Work and work Astronomer.  Reproductive health. Screening  You may have the following tests or measurements:  Height, weight, and BMI.  Blood pressure.  Lipid and cholesterol levels. These may be checked every 5 years, or more frequently if you are over 18 years old.  Skin check.  Lung cancer screening. You may have this screening every year starting at age 27 if you have a 30-pack-year history of smoking and currently smoke or have quit within the past 15 years.  Fecal occult blood test (FOBT) of the stool. You may have this test every year starting at age 20.  Flexible sigmoidoscopy or colonoscopy. You may have a sigmoidoscopy every 5 years or a colonoscopy every 10 years starting at age 24.  Hepatitis C blood test.  Hepatitis B blood test.  Sexually transmitted disease (STD) testing.  Diabetes screening. This is done by checking your blood sugar (glucose) after you have not eaten for a while (fasting). You may have this done every 1-3 years.  Bone density scan. This is done to screen for osteoporosis. You may have this done starting at age 28.  Mammogram. This may be done every 1-2 years. Talk to your health care provider about how often you should have regular mammograms. Talk with your health care provider about your test results, treatment options, and if necessary, the need for more tests. Vaccines  Your health care provider may recommend certain vaccines, such as:  Influenza vaccine. This is recommended every year.  Tetanus,  diphtheria, and acellular pertussis (Tdap, Td) vaccine. You may need a Td booster every 10 years.  Zoster vaccine. You may need this after age 18.  Pneumococcal 13-valent conjugate (PCV13) vaccine. One dose is recommended after age 56.  Pneumococcal  polysaccharide (PPSV23) vaccine. One dose is recommended after age 36. Talk to your health care provider about which screenings and vaccines you need and how often you need them. This information is not intended to replace advice given to you by your health care provider. Make sure you discuss any questions you have with your health care provider. Document Released: 04/02/2015 Document Revised: 11/24/2015 Document Reviewed: 01/05/2015 Elsevier Interactive Patient Education  2017 [employer] Prevention in the Home Falls can cause injuries. They can happen to people of all ages. There are many things you can do to make your home safe and to help prevent falls. What can I do on the outside of my home?  Regularly fix the edges of walkways and driveways and fix any cracks.  Remove anything that might make you trip as you walk through a door, such as a raised step or threshold.  Trim any bushes or trees on the path to your home.  Use bright outdoor lighting.  Clear any walking paths of anything that might make someone trip, such as rocks or tools.  Regularly check to see if handrails are loose or broken. Make sure that both sides of any steps have handrails.  Any raised decks and porches should have guardrails on the edges.  Have any leaves, snow, or ice cleared regularly.  Use sand or salt on walking paths during winter.  Clean up any spills in your garage right away. This includes oil or grease spills. What can I do in the bathroom?  Use night lights.  Install grab bars by the toilet and in the tub and shower. Do not use towel bars as grab bars.  Use non-skid mats or decals in the tub or shower.  If you need to sit down in the shower, use a plastic, non-slip stool.  Keep the floor dry. Clean up any water that spills on the floor as soon as it happens.  Remove soap buildup in the tub or shower regularly.  Attach bath mats securely with double-sided non-slip rug  tape.  Do not have throw rugs and other things on the floor that can make you trip. What can I do in the bedroom?  Use night lights.  Make sure that you have a light by your bed that is easy to reach.  Do not use any sheets or blankets that are too big for your bed. They should not hang down onto the floor.  Have a firm chair that has side arms. You can use this for support while you get dressed.  Do not have throw rugs and other things on the floor that can make you trip. What can I do in the kitchen?  Clean up any spills right away.  Avoid walking on wet floors.  Keep items that you use a lot in easy-to-reach places.  If you need to reach something above you, use a strong step stool that has a grab bar.  Keep electrical cords out of the way.  Do not use floor polish or wax that makes floors slippery. If you must use wax, use non-skid floor wax.  Do not have throw rugs and other things on the floor that can make you trip. What can I do with  my stairs?  Do not leave any items on the stairs.  Make sure that there are handrails on both sides of the stairs and use them. Fix handrails that are broken or loose. Make sure that handrails are as long as the stairways.  Check any carpeting to make sure that it is firmly attached to the stairs. Fix any carpet that is loose or worn.  Avoid having throw rugs at the top or bottom of the stairs. If you do have throw rugs, attach them to the floor with carpet tape.  Make sure that you have a light switch at the top of the stairs and the bottom of the stairs. If you do not have them, ask someone to add them for you. What else can I do to help prevent falls?  Wear shoes that:  Do not have high heels.  Have rubber bottoms.  Are comfortable and fit you well.  Are closed at the toe. Do not wear sandals.  If you use a stepladder:  Make sure that it is fully opened. Do not climb a closed stepladder.  Make sure that both sides of the  stepladder are locked into place.  Ask someone to hold it for you, if possible.  Clearly mark and make sure that you can see:  Any grab bars or handrails.  First and last steps.  Where the edge of each step is.  Use tools that help you move around (mobility aids) if they are needed. These include:  Canes.  Walkers.  Scooters.  Crutches.  Turn on the lights when you go into a dark area. Replace any light bulbs as soon as they burn out.  Set up your furniture so you have a clear path. Avoid moving your furniture around.  If any of your floors are uneven, fix them.  If there are any pets around you, be aware of where they are.  Review your medicines with your doctor. Some medicines can make you feel dizzy. This can increase your chance of falling. Ask your doctor what other things that you can do to help prevent falls. This information is not intended to replace advice given to you by your health care provider. Make sure you discuss any questions you have with your health care provider. Document Released: 12/31/2008 Document Revised: 08/12/2015 Document Reviewed: 04/10/2014 Elsevier Interactive Patient Education  2017 [employer].

## 2019-07-08 NOTE — Progress Notes (Signed)
Subjective:   Lisa [name] is a 86 y.o. female who presents for Medicare Annual (Subsequent) preventive examination.  Review of Systems:   Cardiac Risk Factors include: advanced age (>31men, >62 women);hypertension;dyslipidemia;sedentary lifestyle     Objective:     Vitals: There were no vitals taken for this visit.  There is no height or weight on file to calculate BMI.  Advanced Directives 07/08/2019 06/26/2019 06/05/2019 02/24/2019 02/03/2019 01/04/2019 07/05/2018  Does Patient Have a Medical Advance Directive? Yes Yes Yes Yes Yes Yes Yes  Type of Paramedic of [place];Living will;Out of facility DNR (pink MOST or yellow form) Out of facility DNR (pink MOST or yellow form);Healthcare Power of [employer] of facility DNR (pink MOST or yellow form) [place];Out of facility DNR (pink MOST or yellow form) - Living will;Healthcare Power of [place];Out of facility DNR (pink MOST or yellow form) [place];Living will;Out of facility DNR (pink MOST or yellow form)  Does patient want to make changes to medical advance directive? No - Patient declined No - Patient declined No - Patient declined No - Patient declined - No - Patient declined No - Patient declined  Copy of [place] in Chart? Yes - validated most recent copy scanned in chart (See row information) - - Yes - validated most recent copy scanned in chart (See row information) - No - copy requested Yes - validated most recent copy scanned in chart (See row information)  Would patient like information on creating a medical advance directive? - - - - - No - Patient declined -  Pre-existing out of facility DNR order (yellow form or pink MOST form) Pink MOST/Yellow Form most recent copy in chart - Physician notified to receive inpatient order Pink MOST/Yellow Form most recent copy in chart - Physician notified to receive inpatient order - - - (No Data) -     Tobacco Social History   Tobacco Use  Smoking Status Never Smoker  Smokeless Tobacco Never Used     Counseling given: Not Answered   Clinical Intake:  Pre-visit preparation completed: Yes        BMI - recorded: 19.52 Nutritional Status: BMI of 19-24  Normal Nutritional Risks: Unintentional weight loss Diabetes: No  How often do you need to have someone help you when you read instructions, pamphlets, or other written materials from your doctor or pharmacy?: 2 - Rarely  Interpreter Needed?: No     Past Medical History:  Diagnosis Date  . Cervicalgia   . Chronic kidney disease, stage III (moderate)   . Dermatophytosis of groin and perianal area   . Hyperlipidemia LDL goal < 100   . Insomnia, unspecified   . Muscle weakness (generalized)   . Osteoarthrosis, unspecified whether generalized or localized, lower leg    osteoarthritis - upper thoracic area, left knee.  . Other abnormal glucose   . Other B-complex deficiencies   . Other malaise and fatigue   . Palpitations   . Senile cataract, unspecified   . Shortness of breath   . Stroke [facility])    small "stroke" - right sided weakness for a day.  . Tachycardia, unspecified   . Undiagnosed cardiac murmurs   . Unspecified essential hypertension   . Unspecified vitamin D deficiency    Past Surgical History:  Procedure Laterality Date  . CATARACT EXTRACTION W/PHACO Bilateral 1994   Dr. Katy Fitch  . CATARACT EXTRACTION, BILATERAL Bilateral   . EYE SURGERY  cataract  . TOE AMPUTATION  2002  . TONSILLECTOMY  1944  . TONSILLECTOMY     younger yrs  . TOTAL KNEE ARTHROPLASTY Left 01/15/2015   Procedure: LEFT TOTAL KNEE ARTHROPLASTY;  Surgeon: Eugenia Mcalpine, MD;  Location: [facility];  Service: Orthopedics;  Laterality: Left;  . TUBAL LIGATION  1951   Family History  Problem Relation Age of Onset  . Cerebral aneurysm Mother   . Heart attack Father   . Alzheimer's disease Sister   . Emphysema Sister   . Stroke Sister    . Alzheimer's disease Sister   . Arthritis Sister   . Stroke Sister        Age 25   . Lung cancer Son    Social History   Socioeconomic History  . Marital status: Widowed    Spouse name: Not on file  . Number of children: 4  . Years of education: 12th  . Highest education level: Not on file  Occupational History  . Occupation: retired  Tobacco Use  . Smoking status: Never Smoker  . Smokeless tobacco: Never Used  Substance and Sexual Activity  . Alcohol use: Yes    Comment: rare social every 3 or 4 months wine  . Drug use: No  . Sexual activity: Never  Other Topics Concern  . Not on file  Social History Narrative   Patient lives at home alone   Patient drinks coffee  And coke    Social Determinants of Health   Financial Resource Strain:   . Difficulty of Paying Living Expenses:   Food Insecurity:   . Worried About Programme researcher, broadcasting/film/video in the Last Year:   . Barista in the Last Year:   Transportation Needs:   . Freight forwarder (Medical):   Marland Kitchen Lack of Transportation (Non-Medical):   Physical Activity:   . Days of Exercise per Week:   . Minutes of Exercise per Session:   Stress:   . Feeling of Stress :   Social Connections:   . Frequency of Communication with Friends and Family:   . Frequency of Social Gatherings with Friends and Family:   . Attends Religious Services:   . Active Member of Clubs or Organizations:   . Attends Banker Meetings:   Marland Kitchen Marital Status:     Outpatient Encounter Medications as of 07/08/2019  Medication Sig  . carvedilol (COREG) 6.25 MG tablet Take one tablet by mouth two times daily with a meal  . clopidogrel (PLAVIX) 75 MG tablet TAKE 1 TABLET BY MOUTH  DAILY WITH BREAKFAST  . Cyanocobalamin (B-12 PO) Take 1 tablet by mouth daily.  . Multiple Vitamins-Minerals (PRESERVISION AREDS 2+MULTI VIT PO) Take 2 capsules by mouth 2 (two) times daily.   . traMADol (ULTRAM) 50 MG tablet Take 1 tablet (50 mg total) by  mouth every 12 (twelve) hours as needed.  Marland Kitchen VITAMIN D PO Take 1 tablet by mouth daily.   No facility-administered encounter medications on file as of 07/08/2019.    Activities of Daily Living In your present state of health, do you have any difficulty performing the following activities: 07/08/2019  Hearing? Y  Vision? N  Difficulty concentrating or making decisions? N  Walking or climbing stairs? Y  Comment climbing stairs  Dressing or bathing? N  Doing errands, shopping? Y  Comment has help with daughter  Preparing Food and eating ? N  Using the Toilet? N  In the past six months, have  you accidently leaked urine? Y  Do you have problems with loss of bowel control? N  Managing your Medications? N  Managing your Finances? N  Housekeeping or managing your Housekeeping? N  Some recent data might be hidden    Patient Care Team: Kermit Balo, DO as PCP - General (Geriatric Medicine) Eugenia Mcalpine, MD as Consulting Physician (Orthopedic Surgery) Marvel Plan, MD as Consulting Physician (Neurology) Sallye Lat, MD as Consulting Physician (Ophthalmology) Luciana Axe Alford Highland, MD as Consulting Physician (Ophthalmology)    Assessment:   This is a routine wellness examination for Josefita.  Exercise Activities and Dietary recommendations Current Exercise Habits: The patient does not participate in regular exercise at present  Goals    . <enter goal here>     Starting 04/17/16, I will maintain my current lifestyle.        Fall Risk Fall Risk  07/08/2019 06/26/2019 06/05/2019 02/24/2019 02/07/2019  Falls in the past year? 0 0 0 0 0  Number falls in past yr: 0 0 0 - 0  Injury with Fall? 0 0 0 - 0  Risk for fall due to : - - - - -   Is the patient's home free of loose throw rugs in walkways, pet beds, electrical cords, etc?   yes      Grab bars in the bathroom? yes      Handrails on the stairs?   yes      Adequate lighting?   yes  Timed Get Up and Go performed: na   Depression Screen PHQ 2/9 Scores 07/08/2019 06/26/2019 02/24/2019 08/22/2018  PHQ - 2 Score 0 0 0 0     Cognitive Function MMSE - Mini Mental State Exam 07/02/2017 04/17/2016 04/12/2015  Not completed: - - (No Data)  Orientation to time 5 5 5   Orientation to Place 5 5 5   Registration 3 3 3   Attention/ Calculation 4 4 5   Recall 1 3 2   Language- name 2 objects 2 2 2   Language- repeat 1 1 1   Language- follow 3 step command 3 3 3   Language- read & follow direction 1 1 1   Write a sentence 1 1 1   Copy design 1 0 0  Total score 27 28 28      6CIT Screen 07/08/2019 07/05/2018  What Year? 0 points 0 points  What month? 0 points 0 points  What time? 0 points 0 points  Count back from 20 0 points 0 points  Months in reverse 0 points 0 points  Repeat phrase 8 points 0 points  Total Score 8 0    Immunization History  Administered Date(s) Administered  . Fluad Quad(high Dose 65+) 12/26/2018  . Influenza Split 11/18/2008, 12/28/2009  . Influenza Whole 12/24/2012  . Influenza, High Dose Seasonal PF 12/03/2017  . Influenza,inj,Quad PF,6+ Mos 12/21/2016  . Influenza-Unspecified 01/13/2014, 12/19/2014, 01/15/2016  . PFIZER SARS-COV-2 Vaccination 05/22/2019, 06/18/2019  . PPD Test 01/18/2015  . Pneumococcal Conjugate-13 01/12/2014  . Pneumococcal Polysaccharide-23 05/22/2011  . Tdap 05/22/2011  . Zoster 12/20/2010    Qualifies for Shingles Vaccine? Yes, recommend.   Screening Tests Health Maintenance  Topic Date Due  . INFLUENZA VACCINE  10/19/2019  . TETANUS/TDAP  05/21/2021  . COVID-19 Vaccine  Completed  . PNA vac Low Risk Adult  Completed  . DEXA SCAN  Addressed    Cancer Screenings: Lung: Low Dose CT Chest recommended if Age 84-80 years, 30 pack-year currently smoking OR have quit w/in 15years. Patient does not qualify. Breast:  Up to date on Mammogram? Aged out Up to date of Bone Density/Dexa? No pt declines Colorectal: aged out  Additional Screenings: na: Hepatitis C  Screening:      Plan:      I have personally reviewed and noted the following in the patient's chart:   . Medical and social history . Use of alcohol, tobacco or illicit drugs  . Current medications and supplements . Functional ability and status . Nutritional status . Physical activity . Advanced directives . List of other physicians . Hospitalizations, surgeries, and ER visits in previous 12 months . Vitals . Screenings to include cognitive, depression, and falls . Referrals and appointments  In addition, I have reviewed and discussed with patient certain preventive protocols, quality metrics, and best practice recommendations. A written personalized care plan for preventive services as well as general preventive health recommendations were provided to patient.     Sharon Seller, NP  07/08/2019

## 2019-07-08 NOTE — Progress Notes (Signed)
    This service is provided via telemedicine  No vital signs collected/recorded due to the encounter was a telemedicine visit.   Location of patient (ex: home, work): Home.  Patient consents to a telephone visit: Yes.  Location of the provider (ex: office, home):  [place].   Name of any referring provider: N/A  Names of all persons participating in the telemedicine service and their role in the encounter: Patient, Koston Hennes, RMA, Jessica Eubanks, NP.    Time spent on call: 8 minutes spent on the phone with Medical Assistant.   

## 2019-07-15 ENCOUNTER — Other Ambulatory Visit: Payer: Self-pay

## 2019-07-15 ENCOUNTER — Encounter (HOSPITAL_COMMUNITY): Payer: Self-pay | Admitting: Emergency Medicine

## 2019-07-15 ENCOUNTER — Emergency Department (HOSPITAL_COMMUNITY)
Admission: EM | Admit: 2019-07-15 | Discharge: 2019-07-15 | Disposition: A | Discharge status: Home / Self Care | Payer: Medicare Other | Attending: Emergency Medicine | Admitting: Emergency Medicine

## 2019-07-15 DIAGNOSIS — I491 Atrial premature depolarization: Secondary | ICD-10-CM | POA: Diagnosis not present

## 2019-07-15 DIAGNOSIS — Z743 Need for continuous supervision: Secondary | ICD-10-CM | POA: Diagnosis not present

## 2019-07-15 DIAGNOSIS — R55 Syncope and collapse: Secondary | ICD-10-CM | POA: Diagnosis not present

## 2019-07-15 DIAGNOSIS — I129 Hypertensive chronic kidney disease with stage 1 through stage 4 chronic kidney disease, or unspecified chronic kidney disease: Secondary | ICD-10-CM | POA: Diagnosis not present

## 2019-07-15 DIAGNOSIS — I440 Atrioventricular block, first degree: Secondary | ICD-10-CM | POA: Diagnosis not present

## 2019-07-15 DIAGNOSIS — I4430 Unspecified atrioventricular block: Secondary | ICD-10-CM | POA: Diagnosis not present

## 2019-07-15 DIAGNOSIS — I4510 Unspecified right bundle-branch block: Secondary | ICD-10-CM | POA: Diagnosis not present

## 2019-07-15 DIAGNOSIS — N183 Chronic kidney disease, stage 3 unspecified: Secondary | ICD-10-CM | POA: Diagnosis not present

## 2019-07-15 LAB — CBC
HCT: 37.6 % (ref 36.0–46.0)
Hemoglobin: 11.4 g/dL — ABNORMAL LOW (ref 12.0–15.0)
MCH: 29.6 pg (ref 26.0–34.0)
MCHC: 30.3 g/dL (ref 30.0–36.0)
MCV: 97.7 fL (ref 80.0–100.0)
Platelets: 228 10*3/uL (ref 150–400)
RBC: 3.85 MIL/uL — ABNORMAL LOW (ref 3.87–5.11)
RDW: 13.3 % (ref 11.5–15.5)
WBC: 8.4 10*3/uL (ref 4.0–10.5)
nRBC: 0 % (ref 0.0–0.2)

## 2019-07-15 LAB — URINALYSIS, ROUTINE W REFLEX MICROSCOPIC
Bilirubin Urine: NEGATIVE
Glucose, UA: NEGATIVE mg/dL
Ketones, ur: NEGATIVE mg/dL
Nitrite: NEGATIVE
Protein, ur: 100 mg/dL — AB
Specific Gravity, Urine: 1.023 (ref 1.005–1.030)
WBC, UA: >50 WBC/hpf — ABNORMAL HIGH (ref 0–5)
pH: 5 (ref 5.0–8.0)

## 2019-07-15 LAB — BASIC METABOLIC PANEL
Anion gap: 12 (ref 5–15)
BUN: 28 mg/dL — ABNORMAL HIGH (ref 8–23)
CO2: 23 mmol/L (ref 22–32)
Calcium: 8.7 mg/dL — ABNORMAL LOW (ref 8.9–10.3)
Chloride: 105 mmol/L (ref 98–111)
Creatinine, Ser: 1.25 mg/dL — ABNORMAL HIGH (ref 0.44–1.00)
GFR calc Af Amer: 42 mL/min — ABNORMAL LOW (ref >60)
GFR calc non Af Amer: 37 mL/min — ABNORMAL LOW (ref >60)
Glucose, Bld: 123 mg/dL — ABNORMAL HIGH (ref 70–99)
Potassium: 4.4 mmol/L (ref 3.5–5.1)
Sodium: 140 mmol/L (ref 135–145)

## 2019-07-15 LAB — TROPONIN I (HIGH SENSITIVITY): Troponin I (High Sensitivity): 6 ng/L (ref <18)

## 2019-07-15 MED ORDER — SODIUM CHLORIDE 0.9 % IV BOLUS
500.0000 mL | Freq: Once | INTRAVENOUS | Status: AC
  Administered 2019-07-15: 14:00:00 500 mL via INTRAVENOUS

## 2019-07-15 NOTE — ED Notes (Signed)
Patient given discharge instructions patient verbalizes understanding. 

## 2019-07-15 NOTE — Discharge Instructions (Addendum)
You were evaluated in the Emergency Department and after careful evaluation, we did not find any emergent condition requiring admission or further testing in the hospital.  Your exam/testing today is overall reassuring.  Your symptoms may be related to a urinary tract infection.  As discussed, please see your primary care doctor tomorrow to discuss treatment of this UTI.  Please return to the Emergency Department if you experience any worsening of your condition.  We encourage you to follow up with a primary care provider.  Thank you for allowing [place] to be a part of your care.

## 2019-07-15 NOTE — ED Triage Notes (Signed)
Pt here from home alone with gcems w/co syncope and hypotension. Pt did not fall she just started starring and unresponsive for abt 5 mins. Friend laid her on the floor and pt started coming back around. Hx of orthostatic hypotension.

## 2019-07-15 NOTE — ED Provider Notes (Signed)
[facility] Emergency Department Provider Note MRN:  [id number]  Arrival date & time: 07/15/19     Chief Complaint   Hypotension and Loss of Consciousness   History of Present Illness   Lisa [name] is a 86 y.o. year-old female with a history of CKD, hyperlipidemia presenting to the ED with chief complaint of loss of consciousness.  Patient explains that she was sitting on a chair and passed out.  Friend found her and laid her to the floor and then she began to wake up.  Noted to have soft blood pressure with EMS.  She denies any chest pain before or after the syncopal event, she explains that she has orthostatic hypotension and this is a common issue with her to pass out.  She denies any leg pain or swelling, no shortness of breath that is different from her baseline.  She denies any headache or vision change, no numbness weakness, no abdominal pain, no recent fever or illness.  Review of Systems  A complete 10 system review of systems was obtained and all systems are negative except as noted in the HPI and PMH.   Patient's Health History    Past Medical History:  Diagnosis Date  . Cervicalgia   . Chronic kidney disease, stage III (moderate)   . Dermatophytosis of groin and perianal area   . Hyperlipidemia LDL goal < 100   . Insomnia, unspecified   . Muscle weakness (generalized)   . Osteoarthrosis, unspecified whether generalized or localized, lower leg    osteoarthritis - upper thoracic area, left knee.  . Other abnormal glucose   . Other B-complex deficiencies   . Other malaise and fatigue   . Palpitations   . Senile cataract, unspecified   . Shortness of breath   . Stroke [facility])    small "stroke" - right sided weakness for a day.  . Tachycardia, unspecified   . Undiagnosed cardiac murmurs   . Unspecified essential hypertension   . Unspecified vitamin D deficiency     Past Surgical History:  Procedure Laterality Date  . CATARACT  EXTRACTION W/PHACO Bilateral 1994   Dr. Katy Fitch  . CATARACT EXTRACTION, BILATERAL Bilateral   . EYE SURGERY     cataract  . TOE AMPUTATION  2002  . TONSILLECTOMY  1944  . TONSILLECTOMY     younger yrs  . TOTAL KNEE ARTHROPLASTY Left 01/15/2015   Procedure: LEFT TOTAL KNEE ARTHROPLASTY;  Surgeon: Sydnee Cabal, MD;  Location: [facility];  Service: Orthopedics;  Laterality: Left;  . TUBAL LIGATION  1951    Family History  Problem Relation Age of Onset  . Cerebral aneurysm Mother   . Heart attack Father   . Alzheimer's disease Sister   . Emphysema Sister   . Stroke Sister   . Alzheimer's disease Sister   . Arthritis Sister   . Stroke Sister        Age 19   . Lung cancer Son     Social History   Socioeconomic History  . Marital status: Widowed    Spouse name: Not on file  . Number of children: 4  . Years of education: 12th  . Highest education level: Not on file  Occupational History  . Occupation: retired  Tobacco Use  . Smoking status: Never Smoker  . Smokeless tobacco: Never Used  Substance and Sexual Activity  . Alcohol use: Yes    Comment: rare social every 3 or 4 months wine  . Drug use:  No  . Sexual activity: Never  Other Topics Concern  . Not on file  Social History Narrative   Patient lives at home alone   Patient drinks coffee  And coke    Social Determinants of Health   Financial Resource Strain:   . Difficulty of Paying Living Expenses:   Food Insecurity:   . Worried About Programme researcher, broadcasting/film/video in the Last Year:   . Barista in the Last Year:   Transportation Needs:   . Freight forwarder (Medical):   Marland Kitchen Lack of Transportation (Non-Medical):   Physical Activity:   . Days of Exercise per Week:   . Minutes of Exercise per Session:   Stress:   . Feeling of Stress :   Social Connections:   . Frequency of Communication with Friends and Family:   . Frequency of Social Gatherings with Friends and Family:   . Attends Religious Services:   .  Active Member of Clubs or Organizations:   . Attends Banker Meetings:   Marland Kitchen Marital Status:   Intimate Partner Violence:   . Fear of Current or Ex-Partner:   . Emotionally Abused:   Marland Kitchen Physically Abused:   . Sexually Abused:      Physical Exam   Vitals:   07/15/19 1545 07/15/19 1600  BP: (!) 172/75 (!) 172/76  Pulse: 65 65  Resp: 16 16  Temp:    SpO2: 99% 99%    CONSTITUTIONAL: Chronically ill-appearing, NAD NEURO:  Alert and oriented x 3, normal and symmetric strength and sensation, normal coordination, normal speech EYES:  eyes equal and reactive ENT/NECK:  no LAD, no JVD CARDIO: Regular rate, well-perfused, normal S1 and S2 PULM:  CTAB no wheezing or rhonchi GI/GU:  normal bowel sounds, non-distended, non-tender MSK/SPINE:  No gross deformities, no edema SKIN:  no rash, atraumatic PSYCH:  Appropriate speech and behavior  *Additional and/or pertinent findings included in MDM below  Diagnostic and Interventional Summary    EKG Interpretation  Date/Time:  Tuesday July 15 2019 12:42:12 EDT Ventricular Rate:  73 PR Interval:    QRS Duration: 144 QT Interval:  440 QTC Calculation: 485 R Axis:   35 Text Interpretation: Unknown rhythm, irregular rate Borderline prolonged PR interval Right bundle branch block Abnrm T, consider ischemia, anterolateral lds Confirmed by Kennis Carina [phone]) on 07/15/2019 2:35:44 PM      Labs Reviewed  CBC - Abnormal; Notable for the following components:      Result Value   RBC 3.85 (*)    Hemoglobin 11.4 (*)    All other components within normal limits  BASIC METABOLIC PANEL - Abnormal; Notable for the following components:   Glucose, Bld 123 (*)    BUN 28 (*)    Creatinine, Ser 1.25 (*)    Calcium 8.7 (*)    GFR calc non Af Amer 37 (*)    GFR calc Af Amer 42 (*)    All other components within normal limits  URINALYSIS, ROUTINE W REFLEX MICROSCOPIC - Abnormal; Notable for the following components:   Color, Urine  AMBER (*)    APPearance TURBID (*)    Hgb urine dipstick SMALL (*)    Protein, ur 100 (*)    Leukocytes,Ua MODERATE (*)    WBC, UA >50 (*)    Bacteria, UA MANY (*)    All other components within normal limits  URINE CULTURE  TROPONIN I (HIGH SENSITIVITY)  TROPONIN I (HIGH SENSITIVITY)  No orders to display    Medications  sodium chloride 0.9 % bolus 500 mL (0 mLs Intravenous Stopped 07/15/19 1446)     Procedures  /  Critical Care Procedures  ED Course and Medical Decision Making  I have reviewed the triage vital signs, the nursing notes, and pertinent available records from the EMR.  Listed above are laboratory and imaging tests that I personally ordered, reviewed, and interpreted and then considered in my medical decision making (see below for details).      History of orthostatic hypotension here with syncopal episode, largely without prodrome.  No chest pain but is endorsing right-sided neck pain that she attributes to arthritis.  EKG with nonspecific changes.  Considering arrhythmia, ACS, metabolic disarray, UTI.  Per chart review has had syncopal episodes in the setting of UTI in the past.  Work-up is pending.  Work-up is reassuring, labs at or near baseline, urinalysis with some evidence to suggest infection.  Patient has a history of recurrent UTIs, she has no suprapubic tenderness, no fever.  She wishes to be discharged home and will see her regular doctor tomorrow to discuss antibiotic treatment.  She does not want a prescription for antibiotics and she does not want to be admitted, though she was offered admission given syncope and advanced age for monitoring.  Discharged.  Elmer Sow. Pilar Plate, MD [facility] Emergency Medicine [facility] [email or web address]  Final Clinical Impressions(s) / ED Diagnoses     ICD-10-CM   1. Syncope, unspecified syncope type  R55     ED Discharge Orders    None       Discharge Instructions Discussed with and  Provided to Patient:     Discharge Instructions     You were evaluated in the Emergency Department and after careful evaluation, we did not find any emergent condition requiring admission or further testing in the hospital.  Your exam/testing today is overall reassuring.  Your symptoms may be related to a urinary tract infection.  As discussed, please see your primary care doctor tomorrow to discuss treatment of this UTI.  Please return to the Emergency Department if you experience any worsening of your condition.  We encourage you to follow up with a primary care provider.  Thank you for allowing [place] to be a part of your care.       Sabas Sous, MD 07/15/19 [phone]

## 2019-07-16 ENCOUNTER — Ambulatory Visit (INDEPENDENT_AMBULATORY_CARE_PROVIDER_SITE_OTHER): Payer: Medicare Other | Admitting: Family

## 2019-07-16 ENCOUNTER — Encounter: Payer: Self-pay | Admitting: Family

## 2019-07-16 VITALS — Ht 67.0 in | Wt 124.6 lb

## 2019-07-16 DIAGNOSIS — R300 Dysuria: Secondary | ICD-10-CM | POA: Diagnosis not present

## 2019-07-16 NOTE — Progress Notes (Signed)
This service is provided via telemedicine  No vital signs collected/recorded due to the encounter was a telemedicine visit.   Location of patient (ex: home, work):  Home  Patient consents to a telephone visit:  YEs  Location of the provider (ex: office, home):  [place] office  Name of any referring provider:  Hollace Kinnier, DO  Names of all persons participating in the telemedicine service and their role in the encounter:  Marlowe Sax, NP; Bonney Leitz, [place]; Nunzio Cory Austad  Time spent on call:  8 minutes CMA time    Location:      Place of Service:    Provider: Shea Swalley FNP-C  Gayland Curry, DO  Patient Care Team: Gayland Curry, DO as PCP - General (Geriatric Medicine) Sydnee Cabal, MD as Consulting Physician (Orthopedic Surgery) Rosalin Hawking, MD as Consulting Physician (Neurology) Warden Fillers, MD as Consulting Physician (Ophthalmology) Zadie Rhine Clent Demark, MD as Consulting Physician (Ophthalmology)  Extended Emergency Contact Information Primary Emergency Contact: Smith,Kathy Address: [address] [place], [place] [address] Johnnette Litter of [place] Phone: [phone] Mobile Phone: [phone] Relation: Daughter Secondary Emergency Contact: Festus Holts, [place] Phone: [phone] Relation: Friend  Code Status:  DNR Goals of care: Advanced Directive information Advanced Directives 07/16/2019  Does Patient Have a Medical Advance Directive? Yes  Type of Advance Directive [place]  Does patient want to make changes to medical advance directive? No - Patient declined  Copy of [place] in Chart? Yes - validated most recent copy scanned in chart (See row information)  Would patient like information on creating a medical advance directive? -  Pre-existing out of facility DNR order (yellow form or pink MOST form) -     Chief Complaint  Patient presents with   . Acute Visit    Patient went to the ER for a UTI last night and refused medication, but is now asking for medication.     HPI:  Pt is a 86 y.o. female seen today for an acute visit for evaluation of symptoms of urinary tract infection.she states was seen in the ED last night for hypotension and loss of consciousness.states was sitting down on a chair when she passed out friend found her lying on the floor.Friend began to wake her up.EMS noted low  blood pressure.she stated has chronic orthostatic hypotension.she denies any dizziness,shortness of breath or edema.states having pain with urination.Had urine analysis and culture done in the ED.States was told by ED doctor that she has a UTI but was not given antibiotics.she demands for antibiotics to be prescribed. ED note reviewed urine analysis showed amber,turbid urine with protein 100,moderate leukocytes,> 50 WBC and many bacteria.Nitrites were negative.Urine culture were negative.shedenies any fever,chills,urine frequency or urgency. C&S pending.  WBC normal.   Past Medical History:  Diagnosis Date  . Cervicalgia   . Chronic kidney disease, stage III (moderate)   . Dermatophytosis of groin and perianal area   . Hyperlipidemia LDL goal < 100   . Insomnia, unspecified   . Muscle weakness (generalized)   . Osteoarthrosis, unspecified whether generalized or localized, lower leg    osteoarthritis - upper thoracic area, left knee.  . Other abnormal glucose   . Other B-complex deficiencies   . Other malaise and fatigue   . Palpitations   . Senile cataract, unspecified   . Shortness  of breath   . Stroke [facility])    small "stroke" - right sided weakness for a day.  . Tachycardia, unspecified   . Undiagnosed cardiac murmurs   . Unspecified essential hypertension   . Unspecified vitamin D deficiency    Past Surgical History:  Procedure Laterality Date  . CATARACT EXTRACTION W/PHACO Bilateral 1994   Dr. Dione Booze  . CATARACT EXTRACTION,  BILATERAL Bilateral   . EYE SURGERY     cataract  . TOE AMPUTATION  2002  . TONSILLECTOMY  1944  . TONSILLECTOMY     younger yrs  . TOTAL KNEE ARTHROPLASTY Left 01/15/2015   Procedure: LEFT TOTAL KNEE ARTHROPLASTY;  Surgeon: Eugenia Mcalpine, MD;  Location: [facility];  Service: Orthopedics;  Laterality: Left;  . TUBAL LIGATION  1951    Allergies  Allergen Reactions  . Ace Inhibitors Cough  . Lisinopril Cough  . Simvastatin Other (See Comments)    Body aches and pains    Outpatient Encounter Medications as of 07/16/2019  Medication Sig  . carvedilol (COREG) 6.25 MG tablet Take one tablet by mouth two times daily with a meal  . clopidogrel (PLAVIX) 75 MG tablet TAKE 1 TABLET BY MOUTH  DAILY WITH BREAKFAST  . Cyanocobalamin (B-12 PO) Take 1 tablet by mouth daily.  . diphenhydramine-acetaminophen (TYLENOL PM) 25-500 MG TABS tablet Take 2 tablets by mouth at bedtime as needed (for sleep).  . Multiple Vitamins-Minerals (PRESERVISION AREDS 2+MULTI VIT PO) Take 2 capsules by mouth 2 (two) times daily.   . traMADol (ULTRAM) 50 MG tablet Take 1 tablet (50 mg total) by mouth every 12 (twelve) hours as needed.  Marland Kitchen VITAMIN D PO Take 1 tablet by mouth daily.   No facility-administered encounter medications on file as of 07/16/2019.    Review of Systems  Constitutional: Negative for appetite change, chills, fatigue and fever.  HENT: Negative for congestion, rhinorrhea, sinus pressure, sinus pain, sneezing and sore throat.   Respiratory: Negative for cough, chest tightness, shortness of breath and wheezing.   Cardiovascular: Negative for chest pain, palpitations and leg swelling.  Gastrointestinal: Negative for abdominal distention, abdominal pain, constipation, diarrhea, nausea and vomiting.  Genitourinary: Positive for dysuria. Negative for difficulty urinating, flank pain, frequency, hematuria and urgency.  Neurological: Negative for dizziness, syncope, weakness, light-headedness and headaches.   Psychiatric/Behavioral: Negative for agitation and confusion. The patient is not nervous/anxious.     Immunization History  Administered Date(s) Administered  . Fluad Quad(high Dose 65+) 12/26/2018  . Influenza Split 11/18/2008, 12/28/2009  . Influenza Whole 12/24/2012  . Influenza, High Dose Seasonal PF 12/03/2017  . Influenza,inj,Quad PF,6+ Mos 12/21/2016  . Influenza-Unspecified 01/13/2014, 12/19/2014, 01/15/2016  . PFIZER SARS-COV-2 Vaccination 05/22/2019, 06/18/2019  . PPD Test 01/18/2015  . Pneumococcal Conjugate-13 01/12/2014  . Pneumococcal Polysaccharide-23 05/22/2011  . Tdap 05/22/2011  . Zoster 12/20/2010   Pertinent  Health Maintenance Due  Topic Date Due  . INFLUENZA VACCINE  10/19/2019  . PNA vac Low Risk Adult  Completed  . DEXA SCAN  Addressed   Fall Risk  07/16/2019 07/08/2019 06/26/2019 06/05/2019 02/24/2019  Falls in the past year? 0 0 0 0 0  Number falls in past yr: - 0 0 0 -  Injury with Fall? - 0 0 0 -  Risk for fall due to : - - - - -     Vitals:   07/16/19 1545  Weight: 124 lb 9.6 oz (56.5 kg)  Height: 5\' 7"  (1.702 m)   Body mass index is  19.52 kg/m. Physical Exam  Unable to complete on telephone visit.   Labs reviewed: Recent Labs    01/04/19 1355 06/05/19 0901 07/15/19 1335  NA 138 142 140  K 4.3 4.2 4.4  CL 104 107 105  CO2 24 27 23   GLUCOSE 128* 109* 123*  BUN 24* 27* 28*  CREATININE 1.23* 1.14* 1.25*  CALCIUM 9.0 9.2 8.7*   Recent Labs    01/04/19 1355 06/05/19 0901  AST 21 12  ALT 32 10  ALKPHOS 100  --   BILITOT 0.9 0.6  PROT 7.1 6.4  ALBUMIN 4.1  --    Recent Labs    01/04/19 1355 06/05/19 0901 07/15/19 1335  WBC 11.8* 6.3 8.4  NEUTROABS 9.5* 4,278  --   HGB 13.3 13.2 11.4*  HCT 41.7 40.2 37.6  MCV 95.4 91.6 97.7  PLT 276 277 228   Lab Results  Component Value Date   TSH 2.44 06/05/2019   Lab Results  Component Value Date   HGBA1C 5.7 (H) 12/26/2018   Lab Results  Component Value Date   CHOL 205 (H)  04/17/2016   HDL 26 (L) 04/17/2016   LDLCALC 128 (H) 04/17/2016   TRIG 254 (H) 04/17/2016   CHOLHDL 7.9 (H) 04/17/2016    Significant Diagnostic Results in last 30 days:  Intravitreal Injection, Pharmacologic Agent - OD - Right Eye  Result Date: 07/07/2019 Time Out 07/07/2019. 10:08 AM. Confirmed correct patient, procedure, site, and patient consented. Anesthesia Topical anesthesia was used. Anesthetic medications included [place] 3.5%. Procedure Preparation included 10% betadine to eyelids. Injection: 1.25 mg Bevacizumab (AVASTIN) SOLN   NDC: 07/09/2019   Route: Intravitreal, Site: Right Eye, Waste: 0 mg Post-op Post injection exam found visual acuity of at least counting fingers. The patient tolerated the procedure well. There were no complications. The patient received written and verbal post procedure care education. Post injection medications were not given.   OCT, Retina - OU - Both Eyes  Result Date: 07/07/2019 Right Eye Quality was good. Scan locations included subfoveal. Central Foveal Thickness: 283. Progression has worsened. Findings include abnormal foveal contour, retinal drusen , cystoid macular edema. Left Eye Quality was good. Scan locations included subfoveal. Central Foveal Thickness: 355. Findings include retinal drusen , abnormal foveal contour. Notes OD, focal cystoid change superior to the fovea has worsened at 6-week interval.  Repeat Avastin today and examined 5 weeks OD OS as large subfoveal drusen or deposit no evidence of CN VM, observe   Assessment/Plan   Dysuria Reports no fever or chills.U/A in ED negative for nitrites but had moderate leukocytes.culture and sensitivity pending.will hold on prescribing antibiotics until cultures results return.patient verbalized understanding.  - encouraged to increase water intake to 6-8 glasses daily  - Has AZO tablet but has not taken.Advised to take AZO tablet one by mouth twice daily x 2 days.No orders needs has AZO tablets at  home.  - Provider's office will call with urine culture results once resulted. - Advised to notify provider if running any fever,chills or symptoms worsen.verbalized understanding.    Family/ staff Communication: Reviewed plan of care with patient verbalized understanding.   Labs/tests ordered: None   Next Appointment: as needed if symptoms worsen or fail to improve.  Spent 11 minutes of non-face to face with patient   I connected with  Lavaun Greenfield Hedglin on 07/16/19 by a video enabled telemedicine application and verified that I am speaking with the correct person using two identifiers.   I discussed the  limitations of evaluation and management by telemedicine. The patient expressed understanding and agreed to proceed.  Sandrea Hughs, NP

## 2019-07-16 NOTE — Patient Instructions (Addendum)
-   Increase water intake to 6-8 glasses daily  - Take AZO tablet one by mouth twice daily x 2 days. - Provider's office will call with urine culture results once resulted. - Notify provider if running any fever,chills or symptoms worsen.verbalized understanding  Acute Urinary Retention, Female  Acute urinary retention means that you cannot pee (urinate) at all, or that you pee too little and your bladder is not emptied completely. If it is not treated, it can lead to kidney damage or other serious problems. Follow these instructions at home:  Take over-the-counter and prescription medicines only as told by your doctor. Ask your doctor what medicines you should stay away from. Do not take any medicine unless your doctor says it is okay to do so.  If you were sent home with a tube that drains pee from the bladder (catheter), take care of it as told by your doctor.  Drink enough fluid to keep your pee clear or pale yellow.  If you were given an antibiotic, take it as told by your doctor. Do not stop taking the antibiotic even if you start to feel better.  Do not use any products that contain nicotine or tobacco, such as cigarettes and e-cigarettes. If you need help quitting, ask your doctor.  Watch for changes in your symptoms. Tell your doctor about them.  If told, keep track of any changes in your blood pressure at home. Tell your doctor about them.  Keep all follow-up visits as told by your doctor. This is important. Contact a doctor if:  You have spasms or you leak pee when you have spasms. Get help right away if:  You have chills or a fever.  You have blood in your pee.  You have a tube that drains the bladder and: ? The tube stops draining pee. ? The tube falls out. Summary  Acute urinary retention means that you cannot pee at all, or that you pee too little and your bladder is not emptied completely. If it is not treated, it can result in kidney damage or other serious  problems.  If you were sent home with a tube that drains pee from the bladder, take care of it as told by your doctor.  Pay attention to any changes in your symptoms. Tell your doctor about them. This information is not intended to replace advice given to you by your health care provider. Make sure you discuss any questions you have with your health care provider. Document Revised: 02/16/2017 Document Reviewed: 04/07/2016 Elsevier Patient Education  2020 [employer].

## 2019-07-17 DIAGNOSIS — N958 Other specified menopausal and perimenopausal disorders: Secondary | ICD-10-CM | POA: Diagnosis not present

## 2019-07-17 DIAGNOSIS — R310 Gross hematuria: Secondary | ICD-10-CM | POA: Diagnosis not present

## 2019-07-17 DIAGNOSIS — R82998 Other abnormal findings in urine: Secondary | ICD-10-CM | POA: Diagnosis not present

## 2019-07-17 DIAGNOSIS — N390 Urinary tract infection, site not specified: Secondary | ICD-10-CM | POA: Diagnosis not present

## 2019-07-17 DIAGNOSIS — R319 Hematuria, unspecified: Secondary | ICD-10-CM | POA: Diagnosis not present

## 2019-07-18 LAB — URINE CULTURE: Culture: >=100000 — AB

## 2019-07-19 NOTE — Progress Notes (Signed)
ED Antimicrobial Stewardship Positive Culture Follow Up   Lisa [name] is an 86 y.o. female who presented to [facility] on 07/15/2019 with a chief complaint of LOC and dysuria.  Chief Complaint  Patient presents with  . Hypotension  . Loss of Consciousness    Recent Results (from the past 720 hour(s))  Urine culture     Status: Abnormal   Collection Time: 07/15/19  1:35 PM   Specimen: Urine, Random  Result Value Ref Range Status   Specimen Description URINE, RANDOM  Final   Special Requests   Final    NONE Performed at [facility], [address], [place], [place] [address]    Culture >=100,000 COLONIES/mL KLEBSIELLA PNEUMONIAE (A)  Final   Report Status 07/18/2019 FINAL  Final   Organism ID, Bacteria KLEBSIELLA PNEUMONIAE (A)  Final      Susceptibility   Klebsiella pneumoniae - MIC*    AMPICILLIN >=32 RESISTANT Resistant     CEFAZOLIN <=4 SENSITIVE Sensitive     CEFTRIAXONE <=1 SENSITIVE Sensitive     CIPROFLOXACIN <=0.25 SENSITIVE Sensitive     GENTAMICIN <=1 SENSITIVE Sensitive     IMIPENEM <=0.25 SENSITIVE Sensitive     NITROFURANTOIN 64 INTERMEDIATE Intermediate     TRIMETH/SULFA <=20 SENSITIVE Sensitive     AMPICILLIN/SULBACTAM >=32 RESISTANT Resistant     PIP/TAZO 8 SENSITIVE Sensitive     * >=100,000 COLONIES/mL KLEBSIELLA PNEUMONIAE     [x]  Patient discharged originally without antimicrobial agent and treatment is now indicated  New antibiotic prescription: Cephalexin 500 mg twice daily x 5 days  ED Provider: , PA-C    Harolyn Rutherford, PharmD., BCPS, BCCCP Clinical Pharmacist Clinical phone for 07/19/19 until 10pm7/1/21 If after 10pm, please refer to [facility] for unit-specific pharmacist

## 2019-07-20 ENCOUNTER — Telehealth: Payer: Self-pay | Admitting: Emergency Medicine

## 2019-07-20 NOTE — Telephone Encounter (Signed)
Post ED Visit - Positive Culture Follow-up: Successful Patient Follow-Up  Culture assessed and recommendations reviewed by:  []  , Pharm.D. []  Enzo Bi, Pharm.D., BCPS AQ-ID []  , Pharm.D., BCPS []  Celedonio Miyamoto, Pharm.D., BCPS []  [place], Garvin Fila.D., BCPS, AAHIVP []  , Pharm.D., BCPS, AAHIVP []  Georgina Pillion, PharmD, BCPS []  , PharmD, BCPS []  [place], PharmD, BCPS [x]  [address], PharmD  Positive urine culture  [x]  Patient discharged without antimicrobial prescription and treatment is now indicated []  Organism is resistant to prescribed ED discharge antimicrobial []  Patient with positive blood cultures  Changes discussed with ED provider: PA New antibiotic prescription: Cephalexin   Contacted patient, date 07/20/2019, time 1230 Patient contacted and states she follow-up with her MD @ [facility] and was prescribed Cephalexin for UTI.   Ant[place]an Samford 07/20/2019, 3:29 PM

## 2019-07-29 DIAGNOSIS — N390 Urinary tract infection, site not specified: Secondary | ICD-10-CM | POA: Diagnosis not present

## 2019-07-29 DIAGNOSIS — R319 Hematuria, unspecified: Secondary | ICD-10-CM | POA: Diagnosis not present

## 2019-08-04 DIAGNOSIS — N3281 Overactive bladder: Secondary | ICD-10-CM | POA: Diagnosis not present

## 2019-08-04 DIAGNOSIS — R82998 Other abnormal findings in urine: Secondary | ICD-10-CM | POA: Diagnosis not present

## 2019-08-04 DIAGNOSIS — N390 Urinary tract infection, site not specified: Secondary | ICD-10-CM | POA: Diagnosis not present

## 2019-08-11 ENCOUNTER — Ambulatory Visit (INDEPENDENT_AMBULATORY_CARE_PROVIDER_SITE_OTHER): Payer: Medicare Other | Admitting: Ophthalmology

## 2019-08-11 ENCOUNTER — Encounter (INDEPENDENT_AMBULATORY_CARE_PROVIDER_SITE_OTHER): Payer: Self-pay | Admitting: Ophthalmology

## 2019-08-11 ENCOUNTER — Other Ambulatory Visit: Payer: Self-pay

## 2019-08-11 DIAGNOSIS — H34831 Tributary (branch) retinal vein occlusion, right eye, with macular edema: Secondary | ICD-10-CM

## 2019-08-11 MED ORDER — BEVACIZUMAB CHEMO INJECTION 1.25MG/0.05ML SYRINGE FOR KALEIDOSCOPE
1.2500 mg | INTRAVITREAL | Status: AC | PRN
  Administered 2019-08-11: 1.25 mg via INTRAVITREAL

## 2019-08-11 NOTE — Assessment & Plan Note (Signed)
OD, BRVO with macular CME superiorly now improved 5 weeks after intravitreal Avastin.

## 2019-08-11 NOTE — Progress Notes (Signed)
08/11/2019     CHIEF COMPLAINT Patient presents for Retina Follow Up   HISTORY OF PRESENT ILLNESS: Lisa [name] is a 86 y.o. female who presents to the clinic today for:   HPI    Retina Follow Up    Patient presents with  CRVO/BRVO.  In right eye.  Duration of 5 weeks.  Since onset it is stable.          Comments    5 week follow up - OCT OU, Possible Avastin OD Patient denies change in vision and overall has no complaints.        Last edited by Berenice Bouton on 08/11/2019  9:15 AM. (History)      Referring physician: Kermit Balo, DO [address] [place],  [place] [address]  HISTORICAL INFORMATION:   Selected notes from the MEDICAL RECORD NUMBER    Lab Results  Component Value Date   HGBA1C 5.7 (H) 12/26/2018     CURRENT MEDICATIONS: No current outpatient medications on file. (Ophthalmic Drugs)   No current facility-administered medications for this visit. (Ophthalmic Drugs)   Current Outpatient Medications (Other)  Medication Sig  . carvedilol (COREG) 6.25 MG tablet Take one tablet by mouth two times daily with a meal  . clopidogrel (PLAVIX) 75 MG tablet TAKE 1 TABLET BY MOUTH  DAILY WITH BREAKFAST  . Cyanocobalamin (B-12 PO) Take 1 tablet by mouth daily.  . diphenhydramine-acetaminophen (TYLENOL PM) 25-500 MG TABS tablet Take 2 tablets by mouth at bedtime as needed (for sleep).  . Multiple Vitamins-Minerals (PRESERVISION AREDS 2+MULTI VIT PO) Take 2 capsules by mouth 2 (two) times daily.   . traMADol (ULTRAM) 50 MG tablet Take 1 tablet (50 mg total) by mouth every 12 (twelve) hours as needed.  Marland Kitchen VITAMIN D PO Take 1 tablet by mouth daily.   No current facility-administered medications for this visit. (Other)      REVIEW OF SYSTEMS:    ALLERGIES Allergies  Allergen Reactions  . Ace Inhibitors Cough  . Lisinopril Cough  . Simvastatin Other (See Comments)    Body aches and pains    PAST MEDICAL HISTORY Past Medical History:  Diagnosis  Date  . Cervicalgia   . Chronic kidney disease, stage III (moderate)   . Dermatophytosis of groin and perianal area   . Hyperlipidemia LDL goal < 100   . Insomnia, unspecified   . Muscle weakness (generalized)   . Osteoarthrosis, unspecified whether generalized or localized, lower leg    osteoarthritis - upper thoracic area, left knee.  . Other abnormal glucose   . Other B-complex deficiencies   . Other malaise and fatigue   . Palpitations   . Senile cataract, unspecified   . Shortness of breath   . Stroke [facility])    small "stroke" - right sided weakness for a day.  . Tachycardia, unspecified   . Undiagnosed cardiac murmurs   . Unspecified essential hypertension   . Unspecified vitamin D deficiency    Past Surgical History:  Procedure Laterality Date  . CATARACT EXTRACTION W/PHACO Bilateral 1994   Dr. Dione Booze  . CATARACT EXTRACTION, BILATERAL Bilateral   . EYE SURGERY     cataract  . TOE AMPUTATION  2002  . TONSILLECTOMY  1944  . TONSILLECTOMY     younger yrs  . TOTAL KNEE ARTHROPLASTY Left 01/15/2015   Procedure: LEFT TOTAL KNEE ARTHROPLASTY;  Surgeon: Eugenia Mcalpine, MD;  Location: [facility];  Service: Orthopedics;  Laterality: Left;  . TUBAL  LIGATION  1951    FAMILY HISTORY Family History  Problem Relation Age of Onset  . Cerebral aneurysm Mother   . Heart attack Father   . Alzheimer's disease Sister   . Emphysema Sister   . Stroke Sister   . Alzheimer's disease Sister   . Arthritis Sister   . Stroke Sister        Age 53   . Lung cancer Son     SOCIAL HISTORY Social History   Tobacco Use  . Smoking status: Never Smoker  . Smokeless tobacco: Never Used  Substance Use Topics  . Alcohol use: Yes    Comment: rare social every 3 or 4 months wine  . Drug use: No         OPHTHALMIC EXAM: Base Eye Exam    Visual Acuity (Snellen - Linear)      Right Left   Dist cc 20/25-2 20/70-2   Dist ph cc  NI   Correction: Glasses       Tonometry (Tonopen, 9:20 AM)       Right Left   Pressure 10 9       Pupils      Pupils Dark Light Shape React APD   Right PERRL 3 2 Round Sluggish None   Left PERRL 3 2 Round Sluggish None       Visual Fields (Counting fingers)      Left Right    Full Full       Extraocular Movement      Right Left    Full Full       Dilation    Right eye: 1.0% Mydriacyl, 2.5% Phenylephrine @ 9:20 AM        Slit Lamp and Fundus Exam    External Exam      Right Left   External Normal Normal       Slit Lamp Exam      Right Left   Lids/Lashes Normal Normal   Conjunctiva/Sclera White and quiet White and quiet   Cornea Clear Clear   Anterior Chamber Deep and quiet Deep and quiet   Iris Round and reactive Round and reactive   Lens Posterior chamber intraocular lens Posterior chamber intraocular lens   Vitreous Normal Normal          IMAGING AND PROCEDURES  Imaging and Procedures for 08/11/19  OCT, Retina - OU - Both Eyes       Right Eye Quality was good. Scan locations included subfoveal.   Left Eye Quality was good. Scan locations included subfoveal.                 ASSESSMENT/PLAN:  No problem-specific Assessment & Plan notes found for this encounter.    No diagnosis found.  1.OD, BRVO with macular CME superiorly now improved 5 weeks after intravitreal Avastin.  2.  Repeat examination OD in 5 weeks possible intravitreal Avastin  3.  Ophthalmic Meds Ordered this visit:  No orders of the defined types were placed in this encounter.      No follow-ups on file.  There are no Patient Instructions on file for this visit.   Explained the diagnoses, plan, and follow up with the patient and they expressed understanding.  Patient expressed understanding of the importance of proper follow up care.   Clent Demark Kalliopi Coupland M.D. Diseases & Surgery of the Retina and Vitreous Retina [facility] Diabetic [place] 08/11/19     Abbreviations: M myopia (nearsighted); A astigmatism; H hyperopia  (  farsighted); P presbyopia; Mrx spectacle prescription;  CTL contact lenses; OD right eye; OS left eye; OU both eyes  XT exotropia; ET esotropia; PEK punctate epithelial keratitis; PEE punctate epithelial erosions; DES dry eye syndrome; MGD meibomian gland dysfunction; ATs artificial tears; PFAT's preservative free artificial tears; [place] nuclear sclerotic cataract; PSC posterior subcapsular cataract; ERM epi-retinal membrane; PVD posterior vitreous detachment; RD retinal detachment; DM diabetes mellitus; DR diabetic retinopathy; NPDR non-proliferative diabetic retinopathy; PDR proliferative diabetic retinopathy; CSME clinically significant macular edema; DME diabetic macular edema; dbh dot blot hemorrhages; CWS cotton wool spot; POAG primary open angle glaucoma; C/D cup-to-disc ratio; HVF humphrey visual field; GVF goldmann visual field; OCT optical coherence tomography; IOP intraocular pressure; BRVO Branch retinal vein occlusion; CRVO central retinal vein occlusion; CRAO central retinal artery occlusion; BRAO branch retinal artery occlusion; RT retinal tear; SB scleral buckle; PPV pars plana vitrectomy; VH Vitreous hemorrhage; PRP panretinal laser photocoagulation; IVK intravitreal kenalog; VMT vitreomacular traction; MH Macular hole;  NVD neovascularization of the disc; NVE neovascularization elsewhere; AREDS age related eye disease study; ARMD age related macular degeneration; POAG primary open angle glaucoma; EBMD epithelial/anterior basement membrane dystrophy; ACIOL anterior chamber intraocular lens; IOL intraocular lens; PCIOL posterior chamber intraocular lens; Phaco/IOL phacoemulsification with intraocular lens placement; [place] photorefractive keratectomy; LASIK laser assisted in situ keratomileusis; HTN hypertension; DM diabetes mellitus; COPD chronic obstructive pulmonary disease

## 2019-08-11 NOTE — Patient Instructions (Signed)
Central Retinal Vein Occlusion, Adult  Central retinal vein occlusion (CRVO) is a blockage in the main blood vessel that carries blood away from the retina (central retinal vein). The retina is the part of your eye that senses light and sends signals to the brain that allow you to see. CRVO can cause blurry vision. It can also cause complete or partial vision loss. The condition usually affects only one eye. What are the causes? This condition is usually caused by a blood clot that forms inside the central retinal vein. A clot is blood that has thickened into a gel or solid. Vision loss happens because the blockage in the vein causes fluid to leak out of the vein and collect in the retina. Sometimes the cause is not known. What increases the risk? This condition is more likely to develop in:  People who have a blood vessel disease that causes narrowing of the arteries (atherosclerosis). This is the main risk factor for this condition.  People who are age 50 or older.  People who have any of the following medical conditions: ? High blood pressure (hypertension). ? Diabetes. ? Heart disease. ? High levels of fat in the blood (hyperlipidemia). ? Increased pressure inside the eye (glaucoma). ? Disorders that increase blood clotting (coagulopathy). What are the signs or symptoms? Symptoms of this condition include:  Sudden and painless vision loss. If the vision loss is partial, it may get worse over a span of hours or days.  Sudden and painless blurry vision.  Tiny spots or clumps that move across your vision (floaters).  Pain or pressure in your eye (uncommon). How is this diagnosed? This condition is usually diagnosed by a health care provider who specializes in eye diseases (ophthalmologist). To diagnose the condition, the ophthalmologist will do an eye exam. She or he may also:  Do an exam that involves having eye drops put in the eye (slit lamp exam or dilated fundus exam).  Order  tests that help diagnose the condition, such as: ? A vision test. ? A measurement of the pressure inside your eye. ? A fluorescein angiogram. In this test, pictures are taken of your retina after a dye is injected into your bloodstream. ? Optical coherence tomography. In this test, light waves are used to create pictures of your retina.  Check your blood pressure.  Order tests to find the cause of your condition and rule out other conditions. The tests may check for these problems: ? Diabetes. ? High levels of fat or cholesterol in your blood. ? Abnormal blood clotting. How is this treated? There is no cure for this condition. The goals of treatment are to save as much of your vision as possible and to prevent the condition from happening again. Treatment may include:  Medicine to reduce swelling in your eye. The medicine may be injected in your eye, or an implant may be placed in your eye to release the medicine.  Laser treatment to reduce swelling and stop fluid leakage in the eye (focal laser treatment).  Laser treatment to seal or destroy abnormal blood vessels (pan-retinal laser treatment). Follow these instructions at home:  Use over-the-counter and prescription medicines only as told by your health care provider.  Keep all follow-up visits as told by your health care provider. This is important. How is this prevented? To help prevent this condition:  Work with your health care providers to reduce your risk factors.  Do not use any products that contain nicotine or tobacco, such   as cigarettes and e-cigarettes. If you need help quitting, ask your health care provider.  Follow a heart-healthy diet. This diet includes a lot of fruits, vegetables, grains, and lean protein. It is low in saturated fat and sugar.  Maintain a healthy weight.  Exercise regularly. Contact a health care provider if:  You have any changes in your vision. Get help right away if:  You have eye  pain.  You have symptoms of this condition, including blurry vision or floaters.  You have new or sudden vision loss. Summary  Central retinal vein occlusion (CRVO) is a blockage in the main blood vessel that carries blood away from the retina (central retinal vein). The retina is the part of your eye that senses light and sends signals to the brain that allow you to see.  CRVO can cause blurry vision. It can also cause complete or partial vision loss. The condition usually affects only one eye.  There is no cure for this condition. The goals of treatment are to save as much of your vision as possible and to prevent the condition from happening again.  Get help right away if you have new or sudden vision loss. This information is not intended to replace advice given to you by your health care provider. Make sure you discuss any questions you have with your health care provider. Document Revised: 02/16/2017 Document Reviewed: 05/12/2016 Elsevier Patient Education  2020 [employer]  

## 2019-08-13 ENCOUNTER — Ambulatory Visit: Payer: Self-pay | Admitting: Nurse Practitioner

## 2019-08-25 DIAGNOSIS — N390 Urinary tract infection, site not specified: Secondary | ICD-10-CM | POA: Diagnosis not present

## 2019-08-25 DIAGNOSIS — R319 Hematuria, unspecified: Secondary | ICD-10-CM | POA: Diagnosis not present

## 2019-08-26 DIAGNOSIS — R311 Benign essential microscopic hematuria: Secondary | ICD-10-CM | POA: Diagnosis not present

## 2019-08-26 DIAGNOSIS — R9341 Abnormal radiologic findings on diagnostic imaging of renal pelvis, ureter, or bladder: Secondary | ICD-10-CM | POA: Diagnosis not present

## 2019-08-27 ENCOUNTER — Telehealth: Payer: Self-pay | Admitting: *Deleted

## 2019-08-27 MED ORDER — SERTRALINE HCL 25 MG PO TABS: 25.0000 mg | ORAL_TABLET | Freq: Every day | ORAL | 3 refills | Status: DC

## 2019-08-27 NOTE — Telephone Encounter (Signed)
Patient notified and agreed.  Rx sent to [facility].

## 2019-08-27 NOTE — Telephone Encounter (Signed)
Patient called and stated that she was referred to [facility] by Dr. Renato Gails last year. Stated that she had a follow up appointment yesterday and Dr. Arita Miss recommended patient to take Zoloft for vaginal pain/depression. She told her that her PCP would have to prescribe. Patient is calling wanting to know if Dr. Renato Gails will call it in.  Please Advise.

## 2019-08-27 NOTE — Telephone Encounter (Signed)
We can give that a try--zoloft 25mg  daily for starters and we can reassess her mood (and vaginal pain) at the next visit here.

## 2019-08-29 DIAGNOSIS — R311 Benign essential microscopic hematuria: Secondary | ICD-10-CM | POA: Diagnosis not present

## 2019-09-01 ENCOUNTER — Telehealth: Payer: Self-pay | Admitting: *Deleted

## 2019-09-01 NOTE — Telephone Encounter (Signed)
Patient notified and agreed. Stated that she will stop the medication and see if that is what's causing her symptoms.

## 2019-09-01 NOTE — Telephone Encounter (Signed)
Have her stop the zoloft and see if she feels better.

## 2019-09-01 NOTE — Telephone Encounter (Signed)
Patient called and stated that since starting the Zoloft last week she has been sick on her stomach and weak. No fever. Taking as directed. Patient is wondering if she needs to continue the medication. Please Advise.

## 2019-09-02 DIAGNOSIS — R311 Benign essential microscopic hematuria: Secondary | ICD-10-CM | POA: Diagnosis not present

## 2019-09-02 DIAGNOSIS — N281 Cyst of kidney, acquired: Secondary | ICD-10-CM | POA: Diagnosis not present

## 2019-09-02 DIAGNOSIS — R3129 Other microscopic hematuria: Secondary | ICD-10-CM | POA: Diagnosis not present

## 2019-09-03 ENCOUNTER — Other Ambulatory Visit: Payer: Self-pay

## 2019-09-03 ENCOUNTER — Emergency Department (HOSPITAL_COMMUNITY): Payer: Medicare Other

## 2019-09-03 ENCOUNTER — Inpatient Hospital Stay (HOSPITAL_COMMUNITY)
Admission: RE | Admit: 2019-09-03 | Discharge: 2019-09-05 | DRG: 117 | Disposition: A | Discharge status: Skilled Nursing Facility | Payer: Medicare Other | Attending: Internal Medicine | Admitting: Internal Medicine

## 2019-09-03 ENCOUNTER — Encounter (HOSPITAL_COMMUNITY): Payer: Self-pay | Admitting: Orthopedic Surgery

## 2019-09-03 ENCOUNTER — Observation Stay (HOSPITAL_COMMUNITY): Payer: Medicare Other

## 2019-09-03 ENCOUNTER — Encounter (HOSPITAL_COMMUNITY)
Admission: RE | Disposition: A | Discharge status: Skilled Nursing Facility | Payer: Self-pay | Source: Home / Self Care | Attending: Internal Medicine

## 2019-09-03 ENCOUNTER — Emergency Department (HOSPITAL_COMMUNITY): Payer: Medicare Other | Admitting: Certified Registered"

## 2019-09-03 DIAGNOSIS — R000 Tachycardia, unspecified: Secondary | ICD-10-CM | POA: Diagnosis not present

## 2019-09-03 DIAGNOSIS — R531 Weakness: Secondary | ICD-10-CM

## 2019-09-03 DIAGNOSIS — I129 Hypertensive chronic kidney disease with stage 1 through stage 4 chronic kidney disease, or unspecified chronic kidney disease: Secondary | ICD-10-CM | POA: Diagnosis not present

## 2019-09-03 DIAGNOSIS — H4312 Vitreous hemorrhage, left eye: Secondary | ICD-10-CM | POA: Diagnosis present

## 2019-09-03 DIAGNOSIS — S0592XA Unspecified injury of left eye and orbit, initial encounter: Secondary | ICD-10-CM

## 2019-09-03 DIAGNOSIS — I951 Orthostatic hypotension: Secondary | ICD-10-CM | POA: Diagnosis not present

## 2019-09-03 DIAGNOSIS — N952 Postmenopausal atrophic vaginitis: Secondary | ICD-10-CM | POA: Diagnosis not present

## 2019-09-03 DIAGNOSIS — W1839XA Other fall on same level, initial encounter: Secondary | ICD-10-CM | POA: Diagnosis present

## 2019-09-03 DIAGNOSIS — R519 Headache, unspecified: Secondary | ICD-10-CM | POA: Diagnosis not present

## 2019-09-03 DIAGNOSIS — R609 Edema, unspecified: Secondary | ICD-10-CM | POA: Diagnosis not present

## 2019-09-03 DIAGNOSIS — Z79899 Other long term (current) drug therapy: Secondary | ICD-10-CM

## 2019-09-03 DIAGNOSIS — Z7902 Long term (current) use of antithrombotics/antiplatelets: Secondary | ICD-10-CM | POA: Diagnosis not present

## 2019-09-03 DIAGNOSIS — Z888 Allergy status to other drugs, medicaments and biological substances status: Secondary | ICD-10-CM | POA: Diagnosis not present

## 2019-09-03 DIAGNOSIS — W19XXXA Unspecified fall, initial encounter: Secondary | ICD-10-CM

## 2019-09-03 DIAGNOSIS — I4510 Unspecified right bundle-branch block: Secondary | ICD-10-CM | POA: Diagnosis present

## 2019-09-03 DIAGNOSIS — Y92009 Unspecified place in unspecified non-institutional (private) residence as the place of occurrence of the external cause: Secondary | ICD-10-CM

## 2019-09-03 DIAGNOSIS — S0522XA Ocular laceration and rupture with prolapse or loss of intraocular tissue, left eye, initial encounter: Secondary | ICD-10-CM | POA: Diagnosis not present

## 2019-09-03 DIAGNOSIS — N183 Chronic kidney disease, stage 3 unspecified: Secondary | ICD-10-CM | POA: Diagnosis not present

## 2019-09-03 DIAGNOSIS — Z66 Do not resuscitate: Secondary | ICD-10-CM | POA: Diagnosis not present

## 2019-09-03 DIAGNOSIS — Z20822 Contact with and (suspected) exposure to covid-19: Secondary | ICD-10-CM | POA: Diagnosis present

## 2019-09-03 DIAGNOSIS — Z9181 History of falling: Secondary | ICD-10-CM

## 2019-09-03 DIAGNOSIS — R748 Abnormal levels of other serum enzymes: Secondary | ICD-10-CM

## 2019-09-03 DIAGNOSIS — Z8673 Personal history of transient ischemic attack (TIA), and cerebral infarction without residual deficits: Secondary | ICD-10-CM

## 2019-09-03 DIAGNOSIS — N390 Urinary tract infection, site not specified: Secondary | ICD-10-CM | POA: Diagnosis not present

## 2019-09-03 DIAGNOSIS — E860 Dehydration: Secondary | ICD-10-CM | POA: Diagnosis present

## 2019-09-03 DIAGNOSIS — R220 Localized swelling, mass and lump, head: Secondary | ICD-10-CM | POA: Diagnosis not present

## 2019-09-03 DIAGNOSIS — Z79891 Long term (current) use of opiate analgesic: Secondary | ICD-10-CM

## 2019-09-03 DIAGNOSIS — Z87440 Personal history of urinary (tract) infections: Secondary | ICD-10-CM | POA: Diagnosis not present

## 2019-09-03 DIAGNOSIS — I499 Cardiac arrhythmia, unspecified: Secondary | ICD-10-CM | POA: Diagnosis not present

## 2019-09-03 DIAGNOSIS — R55 Syncope and collapse: Secondary | ICD-10-CM | POA: Diagnosis not present

## 2019-09-03 DIAGNOSIS — S0990XA Unspecified injury of head, initial encounter: Secondary | ICD-10-CM | POA: Diagnosis not present

## 2019-09-03 DIAGNOSIS — Z961 Presence of intraocular lens: Secondary | ICD-10-CM | POA: Diagnosis not present

## 2019-09-03 DIAGNOSIS — Z743 Need for continuous supervision: Secondary | ICD-10-CM | POA: Diagnosis not present

## 2019-09-03 DIAGNOSIS — R0602 Shortness of breath: Secondary | ICD-10-CM | POA: Diagnosis not present

## 2019-09-03 DIAGNOSIS — R7989 Other specified abnormal findings of blood chemistry: Secondary | ICD-10-CM

## 2019-09-03 DIAGNOSIS — E785 Hyperlipidemia, unspecified: Secondary | ICD-10-CM | POA: Diagnosis not present

## 2019-09-03 DIAGNOSIS — J80 Acute respiratory distress syndrome: Secondary | ICD-10-CM | POA: Diagnosis not present

## 2019-09-03 DIAGNOSIS — R945 Abnormal results of liver function studies: Secondary | ICD-10-CM | POA: Diagnosis not present

## 2019-09-03 DIAGNOSIS — S0532XA Ocular laceration without prolapse or loss of intraocular tissue, left eye, initial encounter: Secondary | ICD-10-CM

## 2019-09-03 DIAGNOSIS — K760 Fatty (change of) liver, not elsewhere classified: Secondary | ICD-10-CM | POA: Diagnosis not present

## 2019-09-03 HISTORY — DX: Cardiac arrhythmia, unspecified: I49.9

## 2019-09-03 HISTORY — DX: Chronic kidney disease, unspecified: N18.9

## 2019-09-03 HISTORY — DX: Orthostatic hypotension: I95.1

## 2019-09-03 HISTORY — DX: Urinary tract infection, site not specified: N39.0

## 2019-09-03 HISTORY — PX: RUPTURED GLOBE EXPLORATION AND REPAIR: SHX2366

## 2019-09-03 HISTORY — DX: Dyspnea, unspecified: R06.00

## 2019-09-03 LAB — CBC WITH DIFFERENTIAL/PLATELET
Abs Immature Granulocytes: 0.04 10*3/uL (ref 0.00–0.07)
Basophils Absolute: 0 10*3/uL (ref 0.0–0.1)
Basophils Relative: 1 %
Eosinophils Absolute: 0 10*3/uL (ref 0.0–0.5)
Eosinophils Relative: 1 %
HCT: 34.9 % — ABNORMAL LOW (ref 36.0–46.0)
Hemoglobin: 11 g/dL — ABNORMAL LOW (ref 12.0–15.0)
Immature Granulocytes: 1 %
Lymphocytes Relative: 14 %
Lymphs Abs: 0.8 10*3/uL (ref 0.7–4.0)
MCH: 30.6 pg (ref 26.0–34.0)
MCHC: 31.5 g/dL (ref 30.0–36.0)
MCV: 97.2 fL (ref 80.0–100.0)
Monocytes Absolute: 0.4 10*3/uL (ref 0.1–1.0)
Monocytes Relative: 7 %
Neutro Abs: 4.5 10*3/uL (ref 1.7–7.7)
Neutrophils Relative %: 76 %
Platelets: 217 10*3/uL (ref 150–400)
RBC: 3.59 MIL/uL — ABNORMAL LOW (ref 3.87–5.11)
RDW: 14.3 % (ref 11.5–15.5)
WBC: 5.9 10*3/uL (ref 4.0–10.5)
nRBC: 0 % (ref 0.0–0.2)

## 2019-09-03 LAB — COMPREHENSIVE METABOLIC PANEL
ALT: 202 U/L — ABNORMAL HIGH (ref 0–44)
AST: 45 U/L — ABNORMAL HIGH (ref 15–41)
Albumin: 3.4 g/dL — ABNORMAL LOW (ref 3.5–5.0)
Alkaline Phosphatase: 162 U/L — ABNORMAL HIGH (ref 38–126)
Anion gap: 10 (ref 5–15)
BUN: 25 mg/dL — ABNORMAL HIGH (ref 8–23)
CO2: 23 mmol/L (ref 22–32)
Calcium: 8.5 mg/dL — ABNORMAL LOW (ref 8.9–10.3)
Chloride: 101 mmol/L (ref 98–111)
Creatinine, Ser: 1.29 mg/dL — ABNORMAL HIGH (ref 0.44–1.00)
GFR calc Af Amer: 40 mL/min — ABNORMAL LOW (ref >60)
GFR calc non Af Amer: 35 mL/min — ABNORMAL LOW (ref >60)
Glucose, Bld: 197 mg/dL — ABNORMAL HIGH (ref 70–99)
Potassium: 4.1 mmol/L (ref 3.5–5.1)
Sodium: 134 mmol/L — ABNORMAL LOW (ref 135–145)
Total Bilirubin: 1.4 mg/dL — ABNORMAL HIGH (ref 0.3–1.2)
Total Protein: 5.9 g/dL — ABNORMAL LOW (ref 6.5–8.1)

## 2019-09-03 LAB — URINALYSIS, ROUTINE W REFLEX MICROSCOPIC
Bilirubin Urine: NEGATIVE
Glucose, UA: NEGATIVE mg/dL
Hgb urine dipstick: NEGATIVE
Ketones, ur: NEGATIVE mg/dL
Nitrite: NEGATIVE
Protein, ur: NEGATIVE mg/dL
Specific Gravity, Urine: 1.024 (ref 1.005–1.030)
pH: 5 (ref 5.0–8.0)

## 2019-09-03 LAB — SARS CORONAVIRUS 2 BY RT PCR (HOSPITAL ORDER, PERFORMED IN ~~LOC~~ HOSPITAL LAB): SARS Coronavirus 2: NEGATIVE

## 2019-09-03 LAB — TROPONIN I (HIGH SENSITIVITY)
Troponin I (High Sensitivity): 8 ng/L (ref <18)
Troponin I (High Sensitivity): 11 ng/L (ref <18)

## 2019-09-03 SURGERY — REPAIR, RUPTURE, GLOBE
Anesthesia: General | Site: Eye | Laterality: Left

## 2019-09-03 MED ORDER — ONDANSETRON HCL 4 MG/2ML IJ SOLN: 4.0000 mg | Freq: Once | INTRAMUSCULAR | Status: DC | PRN

## 2019-09-03 MED ORDER — ONDANSETRON HCL 4 MG PO TABS: 4.0000 mg | ORAL_TABLET | Freq: Four times a day (QID) | ORAL | Status: DC | PRN

## 2019-09-03 MED ORDER — MORPHINE SULFATE (PF) 2 MG/ML IV SOLN
2.0000 mg | INTRAVENOUS | Status: DC | PRN
  Administered 2019-09-03: 2 mg via INTRAVENOUS
  Filled 2019-09-03: qty 1

## 2019-09-03 MED ORDER — NA CHONDROIT SULF-NA HYALURON 40-30 MG/ML IO SOLN
INTRAOCULAR | Status: AC
  Filled 2019-09-03: qty 0.5

## 2019-09-03 MED ORDER — LACTATED RINGERS IV SOLN: INTRAVENOUS | Status: DC | PRN

## 2019-09-03 MED ORDER — FLUORESCEIN SODIUM 1 MG OP STRP
1.0000 | ORAL_STRIP | Freq: Once | OPHTHALMIC | Status: AC
  Administered 2019-09-03: 1 via OPHTHALMIC
  Filled 2019-09-03: qty 1

## 2019-09-03 MED ORDER — ROCURONIUM BROMIDE 10 MG/ML (PF) SYRINGE
PREFILLED_SYRINGE | INTRAVENOUS | Status: AC
  Filled 2019-09-03: qty 20

## 2019-09-03 MED ORDER — TETRACAINE HCL 0.5 % OP SOLN
1.0000 [drp] | Freq: Once | OPHTHALMIC | Status: AC
  Administered 2019-09-03: 1 [drp] via OPHTHALMIC
  Filled 2019-09-03: qty 4

## 2019-09-03 MED ORDER — CEFTAZIDIME INTRAVITREAL INJECTION 2.25 MG/0.1 ML
11.2500 mg | INTRAVITREAL | Status: DC
  Filled 2019-09-03: qty 0.5

## 2019-09-03 MED ORDER — BSS IO SOLN
INTRAOCULAR | Status: AC
  Filled 2019-09-03: qty 60

## 2019-09-03 MED ORDER — FENTANYL CITRATE (PF) 250 MCG/5ML IJ SOLN
INTRAMUSCULAR | Status: AC
  Filled 2019-09-03: qty 5

## 2019-09-03 MED ORDER — ONDANSETRON HCL 4 MG/2ML IJ SOLN
INTRAMUSCULAR | Status: DC | PRN
Start: 2019-09-03 — End: 2019-09-03
  Administered 2019-09-03: 4 mg via INTRAVENOUS

## 2019-09-03 MED ORDER — PROPOFOL 10 MG/ML IV BOLUS
INTRAVENOUS | Status: AC
  Filled 2019-09-03: qty 20

## 2019-09-03 MED ORDER — PROPOFOL 10 MG/ML IV BOLUS
INTRAVENOUS | Status: DC | PRN
  Administered 2019-09-03: 120 mg via INTRAVENOUS

## 2019-09-03 MED ORDER — SODIUM CHLORIDE 0.9 % IV SOLN
1.0000 g | INTRAVENOUS | Status: DC
  Administered 2019-09-03: 1 g via INTRAVENOUS
  Filled 2019-09-03: qty 1

## 2019-09-03 MED ORDER — ROCURONIUM 10MG/ML (10ML) SYRINGE FOR MEDFUSION PUMP - OPTIME
INTRAVENOUS | Status: DC | PRN
  Administered 2019-09-03: 130 mg via INTRAVENOUS

## 2019-09-03 MED ORDER — STERILE WATER FOR IRRIGATION IR SOLN
Status: DC | PRN
  Administered 2019-09-03: 200 mL

## 2019-09-03 MED ORDER — ENOXAPARIN SODIUM 30 MG/0.3ML ~~LOC~~ SOLN
30.0000 mg | Freq: Every day | SUBCUTANEOUS | Status: DC
  Administered 2019-09-04 – 2019-09-05 (×2): 30 mg via SUBCUTANEOUS
  Filled 2019-09-03 (×2): qty 0.3

## 2019-09-03 MED ORDER — VANCOMYCIN INTRAVITREAL INJECTION 1 MG/0.1 ML
5.0000 mg | INTRAOCULAR | Status: AC
  Administered 2019-09-03: 2 mg via INTRAVITREAL
  Filled 2019-09-03: qty 0.5

## 2019-09-03 MED ORDER — PROSIGHT PO TABS
1.0000 | ORAL_TABLET | Freq: Two times a day (BID) | ORAL | Status: DC
  Administered 2019-09-03 – 2019-09-05 (×4): 1 via ORAL
  Filled 2019-09-03 (×4): qty 1

## 2019-09-03 MED ORDER — HYDRALAZINE HCL 20 MG/ML IJ SOLN
INTRAMUSCULAR | Status: AC
  Filled 2019-09-03: qty 1

## 2019-09-03 MED ORDER — POVIDONE-IODINE 5 % OP SOLN
OPHTHALMIC | Status: DC | PRN
  Administered 2019-09-03: 1 via OPHTHALMIC

## 2019-09-03 MED ORDER — SODIUM CHLORIDE 0.9 % IV SOLN: INTRAVENOUS | Status: DC

## 2019-09-03 MED ORDER — BSS IO SOLN
INTRAOCULAR | Status: DC | PRN
  Administered 2019-09-03: 15 mL via INTRAOCULAR

## 2019-09-03 MED ORDER — CHOLECALCIFEROL 10 MCG (400 UNIT) PO TABS
400.0000 [IU] | ORAL_TABLET | Freq: Every day | ORAL | Status: DC
  Administered 2019-09-04 – 2019-09-05 (×2): 400 [IU] via ORAL
  Filled 2019-09-03 (×2): qty 1

## 2019-09-03 MED ORDER — BSS IO SOLN
INTRAOCULAR | Status: AC
  Filled 2019-09-03: qty 30

## 2019-09-03 MED ORDER — HYDROCODONE-ACETAMINOPHEN 5-325 MG PO TABS
1.0000 | ORAL_TABLET | ORAL | Status: DC | PRN
  Administered 2019-09-03 – 2019-09-05 (×4): 2 via ORAL
  Filled 2019-09-03 (×4): qty 2

## 2019-09-03 MED ORDER — ACETAMINOPHEN 650 MG RE SUPP: 650.0000 mg | Freq: Four times a day (QID) | RECTAL | Status: DC | PRN

## 2019-09-03 MED ORDER — BACITRACIN-POLYMYXIN B 500-10000 UNIT/GM OP OINT
TOPICAL_OINTMENT | OPHTHALMIC | Status: AC
  Filled 2019-09-03: qty 3.5

## 2019-09-03 MED ORDER — LACTATED RINGERS IV BOLUS
500.0000 mL | Freq: Once | INTRAVENOUS | Status: AC
  Administered 2019-09-03: 500 mL via INTRAVENOUS

## 2019-09-03 MED ORDER — CARVEDILOL 6.25 MG PO TABS
6.2500 mg | ORAL_TABLET | Freq: Two times a day (BID) | ORAL | Status: DC
  Administered 2019-09-03 – 2019-09-05 (×4): 6.25 mg via ORAL
  Filled 2019-09-03 (×4): qty 1

## 2019-09-03 MED ORDER — NA CHONDROIT SULF-NA HYALURON 40-30 MG/ML IO SOLN
INTRAOCULAR | Status: DC | PRN
  Administered 2019-09-03: 0.5 mL via INTRAOCULAR

## 2019-09-03 MED ORDER — HYDRALAZINE HCL 20 MG/ML IJ SOLN
5.0000 mg | Freq: Once | INTRAMUSCULAR | Status: AC
  Administered 2019-09-03: 5 mg via INTRAVENOUS

## 2019-09-03 MED ORDER — 0.9 % SODIUM CHLORIDE (POUR BTL) OPTIME
TOPICAL | Status: DC | PRN
  Administered 2019-09-03: 1000 mL

## 2019-09-03 MED ORDER — FENTANYL CITRATE (PF) 250 MCG/5ML IJ SOLN
INTRAMUSCULAR | Status: DC | PRN
  Administered 2019-09-03 (×2): 50 ug via INTRAVENOUS

## 2019-09-03 MED ORDER — SUGAMMADEX SODIUM 200 MG/2ML IV SOLN
INTRAVENOUS | Status: DC | PRN
Start: 2019-09-03 — End: 2019-09-03
  Administered 2019-09-03: 240 mg via INTRAVENOUS

## 2019-09-03 MED ORDER — EPHEDRINE 5 MG/ML INJ
INTRAVENOUS | Status: AC
  Filled 2019-09-03: qty 10

## 2019-09-03 MED ORDER — CEFTAZIDIME INTRAVITREAL INJECTION 2.25 MG/0.1 ML
11.2500 mg | INTRAVITREAL | Status: AC
  Administered 2019-09-03: 4.5 mg via INTRAVITREAL
  Filled 2019-09-03: qty 0.5

## 2019-09-03 MED ORDER — AMISULPRIDE (ANTIEMETIC) 5 MG/2ML IV SOLN: 10.0000 mg | Freq: Once | INTRAVENOUS | Status: DC | PRN

## 2019-09-03 MED ORDER — ESTROGENS, CONJUGATED 0.625 MG/GM VA CREA
1.0000 | TOPICAL_CREAM | Freq: Every day | VAGINAL | Status: DC
  Administered 2019-09-04 – 2019-09-05 (×2): 1 via VAGINAL
  Filled 2019-09-03: qty 30

## 2019-09-03 MED ORDER — BACITRACIN-POLYMYXIN B 500-10000 UNIT/GM OP OINT
TOPICAL_OINTMENT | OPHTHALMIC | Status: DC | PRN
  Administered 2019-09-03: 1 via OPHTHALMIC

## 2019-09-03 MED ORDER — FENTANYL CITRATE (PF) 100 MCG/2ML IJ SOLN
25.0000 ug | INTRAMUSCULAR | Status: DC | PRN
  Administered 2019-09-03: 25 ug via INTRAVENOUS

## 2019-09-03 MED ORDER — ONDANSETRON HCL 4 MG/2ML IJ SOLN
4.0000 mg | Freq: Four times a day (QID) | INTRAMUSCULAR | Status: DC | PRN
  Administered 2019-09-04 – 2019-09-05 (×2): 4 mg via INTRAVENOUS
  Filled 2019-09-03 (×2): qty 2

## 2019-09-03 MED ORDER — LIDOCAINE HCL 1 % IJ SOLN
INTRAMUSCULAR | Status: DC | PRN
  Administered 2019-09-03: 40 mg via INTRADERMAL

## 2019-09-03 MED ORDER — ACETAMINOPHEN 325 MG PO TABS: 650.0000 mg | ORAL_TABLET | Freq: Four times a day (QID) | ORAL | Status: DC | PRN

## 2019-09-03 MED ORDER — BSS IO SOLN
INTRAOCULAR | Status: AC
  Filled 2019-09-03: qty 500

## 2019-09-03 MED ORDER — B COMPLEX-C PO TABS
1.0000 | ORAL_TABLET | Freq: Every day | ORAL | Status: DC
  Administered 2019-09-04 – 2019-09-05 (×2): 1 via ORAL
  Filled 2019-09-03 (×2): qty 1

## 2019-09-03 MED ORDER — EPHEDRINE SULFATE 50 MG/ML IJ SOLN
INTRAMUSCULAR | Status: DC | PRN
  Administered 2019-09-03: 10 mg via INTRAVENOUS
  Administered 2019-09-03: 5 mg via INTRAVENOUS

## 2019-09-03 MED ORDER — LACTATED RINGERS IV SOLN: INTRAVENOUS | Status: DC

## 2019-09-03 MED ORDER — VANCOMYCIN INTRAVITREAL INJECTION 1 MG/0.1 ML
5.0000 mg | INTRAOCULAR | Status: DC
  Filled 2019-09-03: qty 0.5

## 2019-09-03 MED ORDER — CHLORHEXIDINE GLUCONATE 0.12 % MT SOLN
15.0000 mL | Freq: Once | OROMUCOSAL | Status: AC
  Administered 2019-09-03: 15 mL via OROMUCOSAL

## 2019-09-03 MED ORDER — FLUTICASONE PROPIONATE 50 MCG/ACT NA SUSP
2.0000 | Freq: Every day | NASAL | Status: DC | PRN
  Filled 2019-09-03: qty 16

## 2019-09-03 MED ORDER — DEXAMETHASONE SODIUM PHOSPHATE 10 MG/ML IJ SOLN
INTRAMUSCULAR | Status: DC | PRN
  Administered 2019-09-03: 10 mg via INTRAVENOUS

## 2019-09-03 MED ORDER — FENTANYL CITRATE (PF) 100 MCG/2ML IJ SOLN
INTRAMUSCULAR | Status: AC
  Filled 2019-09-03: qty 2

## 2019-09-03 MED ORDER — DEXAMETHASONE SODIUM PHOSPHATE 10 MG/ML IJ SOLN
INTRAMUSCULAR | Status: AC
  Filled 2019-09-03: qty 1

## 2019-09-03 MED ORDER — CLOPIDOGREL BISULFATE 75 MG PO TABS
75.0000 mg | ORAL_TABLET | Freq: Every day | ORAL | Status: DC
  Administered 2019-09-04 – 2019-09-05 (×2): 75 mg via ORAL
  Filled 2019-09-03 (×2): qty 1

## 2019-09-03 MED ORDER — ONDANSETRON HCL 4 MG/2ML IJ SOLN
INTRAMUSCULAR | Status: AC
  Filled 2019-09-03: qty 2

## 2019-09-03 MED ORDER — LEVOFLOXACIN IN D5W 750 MG/150ML IV SOLN
750.0000 mg | INTRAVENOUS | Status: AC
  Administered 2019-09-03: 750 mg via INTRAVENOUS
  Filled 2019-09-03: qty 150

## 2019-09-03 SURGICAL SUPPLY — 36 items
APL SRG 3 HI ABS STRL LF PLS (MISCELLANEOUS)
APPLICATOR DR MATTHEWS STRL (MISCELLANEOUS) IMPLANT
BAG ISL DRAPE 18X18 STRL (DRAPES)
BAG ISOLATION DRAPE 18X18 (DRAPES) IMPLANT
BLADE EYE CATARACT 19 1.4 BEAV (BLADE) IMPLANT
BLADE MVR KNIFE 20G (BLADE) IMPLANT
CANNULA ANTERIOR CHAMBER 27GA (MISCELLANEOUS) ×1 IMPLANT
CAUTERY EYE LOW TEMP 1300F FIN (OPHTHALMIC RELATED) IMPLANT
CORD BIPOLAR FORCEPS 12FT (ELECTRODE) ×1 IMPLANT
COVER SURGICAL LIGHT HANDLE (MISCELLANEOUS) ×1 IMPLANT
COVER WAND RF STERILE (DRAPES) ×1 IMPLANT
DRAPE ISOLATION BAG 18X18 (DRAPES)
DRAPE OPHTHALMIC 40X48 W POUCH (DRAPES) ×3 IMPLANT
ERASER HMR WETFIELD 23G BP (MISCELLANEOUS) IMPLANT
GAUZE SPONGE 4X4 12PLY STRL LF (GAUZE/BANDAGES/DRESSINGS) ×2 IMPLANT
GLOVE BIO SURGEON STRL SZ7.5 (GLOVE) ×3 IMPLANT
GLOVE BIOGEL PI IND STRL 7.5 (GLOVE) IMPLANT
GLOVE BIOGEL PI IND STRL 8 (GLOVE) IMPLANT
GLOVE BIOGEL PI INDICATOR 7.5 (GLOVE) ×2
GLOVE BIOGEL PI INDICATOR 8 (GLOVE) ×2
GLOVE SURG SS PI 7.5 STRL IVOR (GLOVE) ×2 IMPLANT
GOWN STRL REUS W/ TWL LRG LVL3 (GOWN DISPOSABLE) ×1 IMPLANT
GOWN STRL REUS W/TWL LRG LVL3 (GOWN DISPOSABLE) ×3
KIT BASIN OR (CUSTOM PROCEDURE TRAY) ×3 IMPLANT
LENS BIOM SUPER VIEW SET DISP (MISCELLANEOUS) IMPLANT
NS IRRIG 1000ML POUR BTL (IV SOLUTION) ×3 IMPLANT
PACK CATARACT CUSTOM (CUSTOM PROCEDURE TRAY) ×3 IMPLANT
PAD ARMBOARD 7.5X6 YLW CONV (MISCELLANEOUS) ×6 IMPLANT
ROLLS DENTAL (MISCELLANEOUS) IMPLANT
SPEAR EYE SURG WECK-CEL (MISCELLANEOUS) ×2 IMPLANT
SPECIMEN JAR SMALL (MISCELLANEOUS) IMPLANT
SUT ETHILON 10 0 CS140 6 (SUTURE) ×2 IMPLANT
SUT ETHILON 8 0 TG100 8 (SUTURE) ×2 IMPLANT
SUT VICRYL 8 0 TG140 8 (SUTURE) ×2 IMPLANT
TOWEL GREEN STERILE FF (TOWEL DISPOSABLE) ×4 IMPLANT
WATER STERILE IRR 1000ML POUR (IV SOLUTION) ×3 IMPLANT

## 2019-09-03 NOTE — Anesthesia Postprocedure Evaluation (Signed)
Anesthesia Post Note  Patient: Lisa [name]  Procedure(s) Performed: Scleral laceration repair LEFT EYE, exam under anesthesia LEFT EYE (Left Eye)     Patient location during evaluation: PACU Anesthesia Type: General Level of consciousness: awake and alert Pain management: pain level controlled Vital Signs Assessment: post-procedure vital signs reviewed and stable Respiratory status: spontaneous breathing, nonlabored ventilation, respiratory function stable and patient connected to nasal cannula oxygen Cardiovascular status: blood pressure returned to baseline and stable Postop Assessment: no apparent nausea or vomiting Anesthetic complications: no   No complications documented.  Last Vitals:  Vitals:   09/03/19 2200 09/03/19 2215  BP: (!) 171/68 (!) 178/73  Pulse: 73 72  Resp: (!) 21 13  Temp:  36.5 C  SpO2: 99% 98%    Last Pain:  Vitals:   09/03/19 2215  TempSrc:   PainSc: Asleep                 Noelly Lasseigne COKER

## 2019-09-03 NOTE — Anesthesia Preprocedure Evaluation (Signed)
Anesthesia Evaluation  Patient identified by MRN, date of birth, ID band Patient awake    Reviewed: Allergy & Precautions, NPO status , Patient's Chart, lab work & pertinent test results  Airway Mallampati: II  TM Distance: >3 FB Neck ROM: Full    Dental  (+) Poor Dentition, Teeth Intact, Dental Advisory Given   Pulmonary    breath sounds clear to auscultation       Cardiovascular  Rhythm:Regular Rate:Normal     Neuro/Psych    GI/Hepatic   Endo/Other    Renal/GU      Musculoskeletal   Abdominal   Peds  Hematology   Anesthesia Other Findings   Reproductive/Obstetrics                             Anesthesia Physical Anesthesia Plan  ASA: III and emergent  Anesthesia Plan: General   Post-op Pain Management:    Induction: Intravenous, Rapid sequence and Cricoid pressure planned  PONV Risk Score and Plan: Ondansetron and Dexamethasone  Airway Management Planned: Oral ETT  Additional Equipment:   Intra-op Plan:   Post-operative Plan: Extubation in OR  Informed Consent: I have reviewed the patients History and Physical, chart, labs and discussed the procedure including the risks, benefits and alternatives for the proposed anesthesia with the patient or authorized representative who has indicated his/her understanding and acceptance.     Dental advisory given  Plan Discussed with: CRNA and Anesthesiologist  Anesthesia Plan Comments:         Anesthesia Quick Evaluation

## 2019-09-03 NOTE — Care Management (Addendum)
ED CM consulted concerning patient possible need for Rehab, patient has had recurrent falls, today she fell and injured her eye.  CM met with patient and daughter at bedside in [facility]. Patient and daughter both agree that patient may need a rehab stay for reconditioning.  Patient is awaiting an ophthalmology consult in the ED.  TOC team  will continue to follow to assist with transitional care planning.

## 2019-09-03 NOTE — Op Note (Signed)
Lisa [name] 01/04/1922 Date: 09/03/2019  Preop Diagnosis: Traumatic scleral laceration OS Postop Diagnosis: Traumatic scleral laceration OS  Procedures: Scleral laceration repair OS, exam under anesthesia OS Anesthesia: GETA  Indications: Lisa [name] is a 86 year old woman who sustained a traumatic scleral laceration OS as a result of a fall. It is believed that her glasses frame struck her in the eye. On exam of the surgical eye she was noted to have HM vision, a peaked pupil at 0900, and iris prolapse in the area of the peaking. She was also noted to have a dense vitreous hemorrhage. There was no definite rAPD. The decision was made to proceed with surgery.  Details: Pt was intubated by the anesthesia team and then prepped and draped in the usual sterile fashion for eye surgery. A 180-degree peritomy was made at the limbus from 6-12 o'clock and then extended posteriorly at 9 o'clock. Bare sclera was exposed. Parallel to the limbus from about 9:00 to 10:30 and 3.4 mm was a full-thickness scleral laceration. The full extent of the laceration was appreciated. Iris was carefully reposited back in the eye, but some iris was excised nonetheless to ensure that there was no iris incarcerated in the wound. The sclera was closed with three simple interrupted sutures (8-0 Vicryl). The conjunctiva was then closed at the limbus at about 8:30 with a single 8-0 Vicryl suture. Another buried suture was used to close the conjunctiva posterior to the limbus. Finally, 0.2 mL of vancomycin (1 mg/0.1 mL) and 0.2 mL of ceftazadime (2.25 mg/0.1 mL) were injected intravitreally. The eye was clearly hypertensive at this point. A small paracentesis was therefore made superotemporally with an MVR blade. The eye was notably normotensive with IOL in good position and formed chamber. The eye was then patched and shielded, the patient was extubated, and she was taken to PACU in stable condition.  Estimated Blood  Loss: Minimal Complications: None  R Fabian Sharp, MD

## 2019-09-03 NOTE — ED Provider Notes (Signed)
5:50 PM-patient has been seen by ophthalmology who plans on operative management for open globe injury.  She injured her eye, and a fall, while walking with her walker.  She has had recurrent falling recently.  She had work-up on her right eye, with poor vision result, 2 weeks ago, now has injured her left eye, making it very difficult for her to see, currently.  Briefly, I discussed the history with the patient's daughter at this time.  Patient has been weak, had a fall about 5 weeks ago, and since then has been seeing her primary care doctor for recurrent UTI.  She is also seeing a gynecologist and urologist regarding lower abdominal pain; without definitive diagnosis despite comprehensive evaluation.  Patient's pain is debilitating.  Patient's daughter feels like she had a syncopal episode 5 weeks ago, and probably another one today.  Plan-admission to hospitalist, ophthalmology to manage open globe injury.  Patient will likely need palliative care consult, physical and occupational therapy evaluations, and perhaps modification of living setting.  6:12 PM-Consult complete with hospitalist. Patient case explained and discussed.  She agrees to admit patient for further evaluation and treatment. Call ended at 7:08 PM    Mancel Bale, MD 09/03/19 1911

## 2019-09-03 NOTE — Anesthesia Procedure Notes (Signed)
Procedure Name: Intubation Date/Time: 09/03/2019 8:02 PM Performed by: Claris Che, CRNA Pre-anesthesia Checklist: Patient identified, Emergency Drugs available, Suction available, Patient being monitored and Timeout performed Patient Re-evaluated:Patient Re-evaluated prior to induction Oxygen Delivery Method: Circle system utilized Preoxygenation: Pre-oxygenation with 100% oxygen Induction Type: IV induction, Rapid sequence and Cricoid Pressure applied Ventilation: Mask ventilation without difficulty Laryngoscope Size: Mac and 4 Grade View: Grade II Tube type: Oral Tube size: 7.5 mm Airway Equipment and Method: Stylet Placement Confirmation: ETT inserted through vocal cords under direct vision,  positive ETCO2 and breath sounds checked- equal and bilateral Secured at: 23 cm Tube secured with: Tape Dental Injury: Teeth and Oropharynx as per pre-operative assessment

## 2019-09-03 NOTE — ED Notes (Signed)
Purewick applied to pt °

## 2019-09-03 NOTE — Transfer of Care (Signed)
Immediate Anesthesia Transfer of Care Note  Patient: Gracelyn Coventry Reneger  Procedure(s) Performed: EYE TRAUMATIC OPEN GLOBE REPAIR (Left )  Patient Location: PACU  Anesthesia Type:General  Level of Consciousness: awake, alert , oriented and patient cooperative  Airway & Oxygen Therapy: Patient Spontanous Breathing and Patient connected to nasal cannula oxygen  Post-op Assessment: Report given to RN, Post -op Vital signs reviewed and stable and Patient moving all extremities X 4  Post vital signs: Reviewed and stable  Last Vitals:  Vitals Value Taken Time  BP 180/75 09/03/19 2132  Temp    Pulse 63 09/03/19 2132  Resp 20 09/03/19 2132  SpO2 99 % 09/03/19 2132  Vitals shown include unvalidated device data.  Last Pain:  Vitals:   09/03/19 1734  TempSrc:   PainSc: 10-Worst pain ever         Complications: No complications documented.

## 2019-09-03 NOTE — Progress Notes (Signed)
Orthopedic Tech Progress Note Patient Details:  Sherrice Creekmore Reneger Jun 28, 1921 [id number] LEVEL 2 TRAUMA Patient ID: Doretha Sou Reneger, female   DOB: 04-Oct-1921, 86 y.o.   MRN: [id number]   Donald Pore 09/03/2019, 3:22 PM

## 2019-09-03 NOTE — ED Triage Notes (Signed)
Pt ambulating at home, thinks she passed out, which caused her to fall. Pt has swelling to L side of face from hitting face on TV scan. Pt on Plavix. A/O x 4. One mechanical fall previously this morning, and a syncopal fall just PTA.

## 2019-09-03 NOTE — H&P (Signed)
History and Physical    Lisa [name] IRW:[id number] DOB: 1921-06-03 DOA: 09/03/2019  PCP: Kermit Balo, DO   Patient coming from: home  I have personally briefly reviewed patient's old medical records in [facility]  Chief Complaint: syncope  HPI: Lisa [name] is a 86 y.o. female with no significant past medical history except for recurrent UTIs since November 2020, who at baseline lives independently but now has declining functional status with generalized weakness believed related to her recurrent UTIs who presented to the emergency room following a syncopal episode at home.  Patient with baseline ambulates with a walker, was moving around in her home and suddenly fell, losing consciousness.  Denies any preceding chest pain, shortness of breath, lightheadedness, headache or visual disturbance, one-sided weakness numbness or tingling.  She said she readily came around and was able to crawl to the phone to call her daughter.  Daughter is at the bedside and is contributing to the history.  On coming around, patient started experiencing intense pain and difficulty with vision from the left eye.  She denied any one-sided weakness numbness or tingling ED Course: On arrival in the ER she was awake and alert but complaining of pain of the left eye.  Vital signs were within normal limits.  Blood work significant for creatinine of 1.29 and elevated liver enzymes.  Baseline unknown.  WBC normal at 5.9, hemoglobin 11.  Urinalysis unremarkable.  CT head and orbits unremarkable.  Patient was evaluated by ophthalmology who found scleral laceration of the left eye to be taking patient to the OR emergently. Review of Systems: As per HPI otherwise 10 point review of systems negative.    Past Medical History:  Diagnosis Date  . Chronic kidney disease   . Dyspnea   . Dysrhythmia   . Orthostatic hypotension   . Stroke (HCC)   . Urinary tract infection     History reviewed. No pertinent  surgical history.   has no history on file for tobacco use, alcohol use, and drug use.  Allergies  Allergen Reactions  . Ace Inhibitors Cough  . Lisinopril Cough  . Simvastatin Other (See Comments)    Body aches and pains  . Statins Other (See Comments)    Body aches and pains    History reviewed. No pertinent family history.   Prior to Admission medications   Medication Sig Start Date End Date Taking? Authorizing Provider  amoxicillin (AMOXIL) 500 MG capsule Take 2,000 mg by mouth See admin instructions. Take 2,000 mg by mouth one hour prior to dental procedures 05/21/19  Yes [provider]  b complex vitamins tablet Take 1 tablet by mouth daily.   Yes [provider]  carvedilol (COREG) 6.25 MG tablet Take 6.25 mg by mouth 2 (two) times daily. 06/05/19  Yes [provider]  Cholecalciferol (VITAMIN D3) 10 MCG (400 UNIT) CAPS Take 400 Units by mouth daily.   Yes [provider]  clopidogrel (PLAVIX) 75 MG tablet Take 75 mg by mouth daily. 07/25/19  Yes [provider]  diphenhydramine-acetaminophen (TYLENOL PM) 25-500 MG TABS tablet Take 2 tablets by mouth at bedtime.   Yes [provider]  fluticasone (FLONASE) 50 MCG/ACT nasal spray Place 2 sprays into both nostrils daily as needed for allergies or rhinitis.   Yes [provider]  Multiple Vitamins-Minerals (PRESERVISION AREDS 2+MULTI VIT) CAPS Take 1 capsule by mouth 2 (two) times daily.    Yes [provider]  traMADol Janean Sark) 50  MG tablet Take 50 mg by mouth 2 (two) times daily as needed (for pain).  07/25/19  Yes [provider]  MYRBETRIQ 25 MG TB24 tablet Take 25 mg by mouth at bedtime. Patient not taking: Reported on 09/03/2019 06/16/19   [provider]    Physical Exam: Vitals:   09/03/19 1616 09/03/19 1617 09/03/19 1647 09/03/19 1814  BP: (!) 183/92  (!) 188/79 (!) 167/74  Pulse:  80 81 65  Resp:  12 16 17   Temp:      TempSrc:        SpO2:  99% 99% 99%  Weight:      Height:         Vitals:   09/03/19 1616 09/03/19 1617 09/03/19 1647 09/03/19 1814  BP: (!) 183/92  (!) 188/79 (!) 167/74  Pulse:  80 81 65  Resp:  12 16 17   Temp:      TempSrc:      SpO2:  99% 99% 99%  Weight:      Height:          Constitutional: Alert and oriented x 3 . Not in any apparent distress HEENT:      Head: Normocephalic and atraumatic.         Eyes:  Redness left conjunctiva, defer to ophthalmology exam done earlier      Mouth/Throat: Mucous membranes are moist.       Neck: Supple with no signs of meningismus. Cardiovascular: Regular rate and rhythm. No murmurs, gallops, or rubs. 2+ symmetrical distal pulses are present . No JVD. No LE edema Respiratory: Respiratory effort normal .Lungs sounds clear bilaterally. No wheezes, crackles, or rhonchi.  Gastrointestinal: Soft, non tender, and non distended with positive bowel sounds. No rebound or guarding. Genitourinary: No CVA tenderness. Musculoskeletal: Nontender with normal range of motion in all extremities. No edema, cyanosis, or erythema of extremities. Neurologic: Normal speech and language. Face is symmetric. Moving all extremities. No gross focal neurologic deficits . Skin: Skin is warm, dry.  No rash or ulcers Psychiatric: Mood and affect are normal Speech and behavior are normal   Labs on Admission: I have personally reviewed following labs and imaging studies  CBC: Recent Labs  Lab 09/03/19 1400  WBC 5.9  NEUTROABS 4.5  HGB 11.0*  HCT 34.9*  MCV 97.2  PLT 217   Basic Metabolic Panel: Recent Labs  Lab 09/03/19 1400  NA 134*  K 4.1  CL 101  CO2 23  GLUCOSE 197*  BUN 25*  CREATININE 1.29*  CALCIUM 8.5*   GFR: Estimated Creatinine Clearance: 23.8 mL/min (A) (by C-G formula based on SCr of 1.29 mg/dL (H)). Liver Function Tests: Recent Labs  Lab 09/03/19 1400  AST 45*  ALT 202*  ALKPHOS 162*  BILITOT 1.4*  PROT 5.9*  ALBUMIN 3.4*   No results  for input(s): LIPASE, AMYLASE in the last 168 hours. No results for input(s): AMMONIA in the last 168 hours. Coagulation Profile: No results for input(s): INR, PROTIME in the last 168 hours. Cardiac Enzymes: No results for input(s): CKTOTAL, CKMB, CKMBINDEX, TROPONINI in the last 168 hours. BNP (last 3 results) No results for input(s): PROBNP in the last 8760 hours. HbA1C: No results for input(s): HGBA1C in the last 72 hours. CBG: No results for input(s): GLUCAP in the last 168 hours. Lipid Profile: No results for input(s): CHOL, HDL, LDLCALC, TRIG, CHOLHDL, LDLDIRECT in the last 72 hours. Thyroid Function Tests: No results for input(s): TSH, T4TOTAL, FREET4, T3FREE, THYROIDAB in  the last 72 hours. Anemia Panel: No results for input(s): VITAMINB12, FOLATE, FERRITIN, TIBC, IRON, RETICCTPCT in the last 72 hours. Urine analysis:    Component Value Date/Time   COLORURINE AMBER (A) 09/03/2019 1522   APPEARANCEUR HAZY (A) 09/03/2019 1522   LABSPEC 1.024 09/03/2019 1522   PHURINE 5.0 09/03/2019 1522   GLUCOSEU NEGATIVE 09/03/2019 1522   HGBUR NEGATIVE 09/03/2019 1522   BILIRUBINUR NEGATIVE 09/03/2019 1522   KETONESUR NEGATIVE 09/03/2019 1522   PROTEINUR NEGATIVE 09/03/2019 1522   NITRITE NEGATIVE 09/03/2019 1522   LEUKOCYTESUR SMALL (A) 09/03/2019 1522    Radiological Exams on Admission: CT HEAD WO CONTRAST  Result Date: 09/03/2019 CLINICAL DATA:  Syncope, left-sided facial swelling EXAM: CT HEAD WITHOUT CONTRAST TECHNIQUE: Contiguous axial images were obtained from the base of the skull through the vertex without intravenous contrast. COMPARISON:  None. FINDINGS: Brain: Focal hypodensities are seen within the right basal ganglia and left corona radiata consistent with chronic lacunar infarcts. Hypodensities in the bilateral periventricular white matter consistent with age-indeterminate small vessel ischemic change. No other signs of acute infarct or hemorrhage. The lateral ventricles  and midline structures are unremarkable. No acute extra-axial fluid collections. No mass effect. Vascular: No hyperdense vessel or unexpected calcification. Skull: Normal. Negative for fracture or focal lesion. Sinuses/Orbits: Mucosal thickening is seen within the right maxillary sinus. Remaining sinuses are clear. Other: None. IMPRESSION: 1. Likely chronic small-vessel ischemic changes throughout the white matter and basal ganglia as above. 2. No acute intracranial trauma. Electronically Signed   By: Sharlet Salina M.D.   On: 09/03/2019 16:05   DG Chest Port 1 View  Result Date: 09/03/2019 CLINICAL DATA:  Syncope and fall, denies chest pain or shortness of breath EXAM: PORTABLE CHEST 1 VIEW COMPARISON:  None. FINDINGS: Mild hyperinflation with relative apical lucency and coarsened interstitial changes towards the bases. No consolidation, features of edema, pneumothorax, or effusion. The aorta is calcified. The remaining cardiomediastinal contours are unremarkable. No acute osseous or soft tissue abnormality. Degenerative changes are present in the imaged spine and shoulders. IMPRESSION: No acute cardiopulmonary or traumatic findings within the chest. Hyperinflation and coarsened interstitium. Can be seen as chronic features. Aortic Atherosclerosis (ICD10-I70.0). Electronically Signed   By: Kreg Shropshire M.D.   On: 09/03/2019 15:04   CT Orbits Wo Contrast  Result Date: 09/03/2019 CLINICAL DATA:  Syncope, ophthalmoplegia, left-sided facial swelling EXAM: CT ORBITS WITHOUT CONTRAST TECHNIQUE: Multidetector CT images were obtained using the standard protocol without intravenous contrast. COMPARISON:  None. FINDINGS: Orbits: No traumatic or inflammatory finding. Globes, optic nerves, orbital fat, extraocular muscles, vascular structures, and lacrimal glands are normal. Visualized sinuses: Mucosal thickening right maxillary sinus. Remaining sinuses are clear. Soft tissues: Negative. Limited intracranial: No  significant or unexpected finding. IMPRESSION: 1. No acute displaced fracture.  Unremarkable orbits and globes. Electronically Signed   By: Sharlet Salina M.D.   On: 09/03/2019 16:08    EKG: Independently reviewed.   Assessment/Plan Syncope and collapse -Uncertain etiology.  Patient reportedly has a history of orthostatic hypotension in the past but records unavailable -Syncope work-up to include carotid Doppler, echocardiogram, continuous cardiac monitoring -Head CT was unremarkable -Troponin negative and flat at 8 and 11 -Orthostatic vital signs -IV hydration with normal saline   Scleral laceration of left eye -Ophthalmology taking patient to the OR tonight -orders as per ophthalmology    Generalized weakness -Suspect due to general physical deconditioning and recurrent UTIs.  No evidence of UTI -Physical therapy evaluation -At baseline patient ambulates with  a cane    Recurrent UTI -No evidence of UTI    Abnormal LFTs -baseline uncertain -Right upper quadrant sonogram    DVT prophylaxis: Lovenox  Code Status: DNR Family Communication: Daughter at bedside Disposition Plan: Back to previous home environment Consults called: none . Opthalmology following Status:obs    Athena Masse MD [facility]     09/03/2019, 8:35 PM

## 2019-09-03 NOTE — ED Provider Notes (Signed)
Lisa [name] [facility] EMERGENCY DEPARTMENT Provider Note   CSN: [id number] Arrival date & time: 09/03/19  1351     History No chief complaint on file.   Lisa [name] is a 86 y.o. female.  Patient is a 86 year old female with a history of recurrent UTIs who has been on multiple antibiotics, intermittent tachycardia, chronic shortness of breath without known cause, prior stroke and CKD who is presenting today after a syncopal event and fall.  Patient reports that for the last 2 to 3 weeks she has not been feeling great and was walking with her walker and recalls feeling hot and not well in the next time she woke up on the floor.  Patient reports that her glasses were still on her face but were not on correctly and she had significant pain in the left eye.  She denies any chest pain, palpitations or shortness of breath prior to or after the episode.  She does have history of orthostatic hypotension and reports she does pass out intermittently.  She denies any diarrhea, fever, cough or abdominal pain.  She reports that she is currently getting treatments for her retina by Dr. Luciana Axe on the right eye but her left eye is typically her good eye and today she is unable to see out of it and it is painful.  The history is provided by the patient.       No past medical history on file.  There are no problems to display for this patient.      OB History   No obstetric history on file.     No family history on file.  Social History   Tobacco Use  . Smoking status: Not on file  Substance Use Topics  . Alcohol use: Not on file  . Drug use: Not on file    Home Medications Prior to Admission medications   Not on File    Allergies    Lisinopril and Statins  Review of Systems   Review of Systems  All other systems reviewed and are negative.   Physical Exam Updated Vital Signs BP (!) 179/84   Pulse 84   Temp 98 F (36.7 C) (Oral)   Resp 15   Ht 5\' 7"  (1.702  m)   Wt 59 kg   SpO2 99%   BMI 20.36 kg/m   Physical Exam Vitals and nursing note reviewed.  Constitutional:      General: She is not in acute distress.    Appearance: Normal appearance. She is well-developed and normal weight.  HENT:     Head: Normocephalic and atraumatic.     Mouth/Throat:     Mouth: Mucous membranes are moist.  Eyes:     Comments: Right pupil is approximately 3 mm and reactive, left pupil is shaped and nonreactive.  Only perceives light and dark from the left eye with surrounding hyperemia and swelling noted.  Also swelling noted around the supraorbital and infraorbital area with ecchymosis.  Fluorescein placed but no frank leakage of fluorescein no specific fluorescein uptake.  Small hyphema noted layering at the bottom of the eye.  Pain with extraocular movement.  Cardiovascular:     Rate and Rhythm: Normal rate and regular rhythm.     Pulses: Normal pulses.     Heart sounds: Murmur heard.  No friction rub.  Pulmonary:     Effort: Pulmonary effort is normal.     Breath sounds: Normal breath sounds. No wheezing or rales.  Abdominal:  General: Bowel sounds are normal. There is no distension.     Palpations: Abdomen is soft.     Tenderness: There is no abdominal tenderness. There is no guarding or rebound.  Musculoskeletal:        General: No tenderness. Normal range of motion.     Cervical back: Normal range of motion and neck supple. No tenderness.     Comments: No edema.  Small skin tear noted to the left shin without tenderness with palpation.  Skin:    General: Skin is warm and dry.     Findings: No rash.  Neurological:     General: No focal deficit present.     Mental Status: She is alert and oriented to person, place, and time. Mental status is at baseline.     Cranial Nerves: No cranial nerve deficit.  Psychiatric:        Mood and Affect: Mood normal.        Behavior: Behavior normal.        Thought Content: Thought content normal.     ED  Results / Procedures / Treatments   Labs (all labs ordered are listed, but only abnormal results are displayed) Labs Reviewed  CBC WITH DIFFERENTIAL/PLATELET - Abnormal; Notable for the following components:      Result Value   RBC 3.59 (*)    Hemoglobin 11.0 (*)    HCT 34.9 (*)    All other components within normal limits  COMPREHENSIVE METABOLIC PANEL - Abnormal; Notable for the following components:   Sodium 134 (*)    Glucose, Bld 197 (*)    BUN 25 (*)    Creatinine, Ser 1.29 (*)    Calcium 8.5 (*)    Total Protein 5.9 (*)    Albumin 3.4 (*)    AST 45 (*)    ALT 202 (*)    Alkaline Phosphatase 162 (*)    Total Bilirubin 1.4 (*)    GFR calc non Af Amer 35 (*)    GFR calc Af Amer 40 (*)    All other components within normal limits  URINALYSIS, ROUTINE W REFLEX MICROSCOPIC  TROPONIN I (HIGH SENSITIVITY)    EKG EKG Interpretation  Date/Time:  Wednesday September 03 2019 14:46:14 EDT Ventricular Rate:  86 PR Interval:    QRS Duration: 139 QT Interval:  403 QTC Calculation: 482 R Axis:   81 Text Interpretation: Sinus rhythm Prolonged PR interval Right bundle branch block No previous tracing Confirmed by Blanchie Dessert [phone]) on 09/03/2019 3:10:09 PM   Radiology DG Chest Port 1 View  Result Date: 09/03/2019 CLINICAL DATA:  Syncope and fall, denies chest pain or shortness of breath EXAM: PORTABLE CHEST 1 VIEW COMPARISON:  None. FINDINGS: Mild hyperinflation with relative apical lucency and coarsened interstitial changes towards the bases. No consolidation, features of edema, pneumothorax, or effusion. The aorta is calcified. The remaining cardiomediastinal contours are unremarkable. No acute osseous or soft tissue abnormality. Degenerative changes are present in the imaged spine and shoulders. IMPRESSION: No acute cardiopulmonary or traumatic findings within the chest. Hyperinflation and coarsened interstitium. Can be seen as chronic features. Aortic Atherosclerosis  (ICD10-I70.0). Electronically Signed   By: Lovena Le M.D.   On: 09/03/2019 15:04    Procedures Procedures (including critical care time)  Medications Ordered in ED Medications  fluorescein ophthalmic strip 1 strip (1 strip Left Eye Given by Other 09/03/19 1441)  tetracaine (PONTOCAINE) 0.5 % ophthalmic solution 1 drop (1 drop Left Eye Given by Other 09/03/19  1441)    ED Course  I have reviewed the triage vital signs and the nursing notes.  Pertinent labs & imaging results that were available during my care of the patient were reviewed by me and considered in my medical decision making (see chart for details).    MDM Rules/Calculators/A&P                          Elderly female presenting today after a syncopal event.  Patient reports that she has recently had multiple UTIs has been treated with antibiotics and just does not seem to be recovering after finishing the antibiotic.  She does have a history of orthostatic hypotension and thinks that is what happened today.  She is denying any chest pain or shortness of breath she has no confusion but does take Plavix and did hit her head today with a syncopal event.  Patient has significant trauma to the left eye with irregular pupil and decreased vision.  Concern for open globe at this time and spoke with her ophthalmologist Dr. Dione Booze.  CT of head and orbits are pending.  Patient's EKG shows right bundle branch block which is unchanged from prior, CBC without acute findings, CMP with a creatinine of 1.29 which is unchanged from April, LFTs are elevated with an ALT of 202 and an AST of 45 with a total bilirubin of 1.4 which is new from previous.  Patient has no history of abdominal pain at this time and low suspicion for hepatitis or cholelithiasis.  Patient does report that she is recently had some changes in medications which may be culprit for the LFTs which will need to be followed up on.  Chest x-ray within normal limits.  UA today without  evidence of infection.  CT head without acute findings and CT orbit neg.  Dr. Dione Booze who will come and evaluate the patient.  Pt checked out to Dr. Effie Shy.  MDM Number of Diagnoses or Management Options   Amount and/or Complexity of Data Reviewed Clinical lab tests: ordered and reviewed Tests in the radiology section of CPT: ordered and reviewed Tests in the medicine section of CPT: ordered and reviewed Decide to obtain previous medical records or to obtain history from someone other than the patient: yes Review and summarize past medical records: yes Independent visualization of images, tracings, or specimens: yes  Risk of Complications, Morbidity, and/or Mortality Presenting problems: moderate Diagnostic procedures: low Management options: low  Patient Progress Patient progress: stable   Final Clinical Impression(s) / ED Diagnoses Final diagnoses:  Syncope, unspecified syncope type  Dehydration  Left eye injury, initial encounter    Rx / DC Orders ED Discharge Orders    None       Gwyneth Sprout, MD 09/03/19 1638

## 2019-09-03 NOTE — Consult Note (Signed)
Ophthalmology Initial Consult Note  Lisa [name], 86 y.o. female Date of Service:  @TODAYSDATE @  Requesting physician: , MD  Information Obtained from: chart Chief Complaint:  Open globe r/o  HPI/Discussion:  Lisa [name] is a 86 y.o. female who presents s/p far to face with LOC. She reports pain and very blurry vision OS. ER noted irregular pupil OS. CT was not confirmatory for an open globe, but given the pupil and decreased VA, ophthalmology consult was obtained. She reports no VA changes OD, while OS is extremely blurry and can only detect movement.  Past Ocular Hx:  Pseudophakia OU (Dr. 94), RVO OD (Dr. Ernesto Rutherford) Ocular Meds:  None Family ocular history: Noncontributory  No past medical history on file.  Prior to Admission Meds: (Not in a hospital admission)   Inpatient Meds: See chart  Allergies  Allergen Reactions  . Lisinopril   . Statins    Social History   Tobacco Use  . Smoking status: Not on file  Substance Use Topics  . Alcohol use: Not on file   No family history on file.  ROS: Other than ROS in the HPI, all other systems were negative.  Exam: Temp: 98 F (36.7 C) Pulse Rate: 81 BP: (!) 188/79 Resp: 16 SpO2: 99 %  Visual Acuity:  near   OD 20/40-   OS HM      OD OS  Confr Vis Fields Full Extinguished  EOM (Primary) Deferred Deferred  Lids/Lashes Normal Normal  Conjunctiva White, quiet Bulllous SCH was prolapsed iris nasal to limbus  Adnexa  Normal Ecchymosis/edema - mild  Pupils  1.5 mm, sluggish, no rAPD Irregular, nonreactive, no obvious rAPD  Cornea  Clear Clear  Anterior Chamber Formed, grossly quiet Formed  Lens:  IOL IOL  IOP 16 Soft to gentle palpation  Fundus - Dilated? YES OD   Optic Disc - C:D Ratio 0.35 No view  Post Seg:  Retina Normal No view                  Vessels Normal caliber No view                  Vitreous  Clear Dense vitreous hemorrhage                  Macula Normal No  view                  Periphery Normal, pigmentary changes No view       Neuro:  Oriented to person, place, and time:  Yes Psychiatric:  Mood and Affect Appropriate:  Yes  Labs/imaging:   A/P:  86 y.o. female with fall in which she sustained:  1) Traumatic full-thickness scleral laceration OS - No obvious rAPD, which is reassuring. - VA is HM. Iris is prolapsed through the wound nasally. - Very guarded visual prognosis. - Will schedule for emergent OR repair. - Recommend IV fluoroquinolone in the interim and to cover the eye with a protective shield. - Given her age and overall health status, I prefer to have her admitted overnight upon completion of the surgery.  2) Pseudophakia OU - Monitor.  R 94, MD  R Fabian Sharp, MD 09/03/2019, 6:10 PM

## 2019-09-03 NOTE — Progress Notes (Signed)
S/p open globe repair OS. The eye should remain patched overnight. Pain control per hospitalist - greatly appreciate assistance. I will see the patient tomorrow around 0730AM. Recommend the following drops to be started after I see her tomorrow:  Prednisolone acetate 1% solution QID OS Ofloxacin ophthalmic solution QID OS  If there are any questions, please feel free to call my cell at [phone].  R Fabian Sharp, MD

## 2019-09-04 ENCOUNTER — Encounter (HOSPITAL_COMMUNITY): Payer: Self-pay | Admitting: Ophthalmology

## 2019-09-04 ENCOUNTER — Observation Stay (HOSPITAL_COMMUNITY): Payer: Medicare Other

## 2019-09-04 ENCOUNTER — Ambulatory Visit (HOSPITAL_COMMUNITY): Payer: Medicare Other

## 2019-09-04 DIAGNOSIS — Z888 Allergy status to other drugs, medicaments and biological substances status: Secondary | ICD-10-CM | POA: Diagnosis not present

## 2019-09-04 DIAGNOSIS — Z743 Need for continuous supervision: Secondary | ICD-10-CM | POA: Diagnosis not present

## 2019-09-04 DIAGNOSIS — Z79891 Long term (current) use of opiate analgesic: Secondary | ICD-10-CM | POA: Diagnosis not present

## 2019-09-04 DIAGNOSIS — R945 Abnormal results of liver function studies: Secondary | ICD-10-CM | POA: Diagnosis not present

## 2019-09-04 DIAGNOSIS — Z9181 History of falling: Secondary | ICD-10-CM | POA: Diagnosis not present

## 2019-09-04 DIAGNOSIS — M6282 Rhabdomyolysis: Secondary | ICD-10-CM | POA: Diagnosis not present

## 2019-09-04 DIAGNOSIS — M2550 Pain in unspecified joint: Secondary | ICD-10-CM | POA: Diagnosis not present

## 2019-09-04 DIAGNOSIS — E860 Dehydration: Secondary | ICD-10-CM

## 2019-09-04 DIAGNOSIS — I951 Orthostatic hypotension: Secondary | ICD-10-CM

## 2019-09-04 DIAGNOSIS — R55 Syncope and collapse: Secondary | ICD-10-CM

## 2019-09-04 DIAGNOSIS — Z7401 Bed confinement status: Secondary | ICD-10-CM | POA: Diagnosis not present

## 2019-09-04 DIAGNOSIS — Z961 Presence of intraocular lens: Secondary | ICD-10-CM | POA: Diagnosis present

## 2019-09-04 DIAGNOSIS — S0532XA Ocular laceration without prolapse or loss of intraocular tissue, left eye, initial encounter: Secondary | ICD-10-CM

## 2019-09-04 DIAGNOSIS — Z8673 Personal history of transient ischemic attack (TIA), and cerebral infarction without residual deficits: Secondary | ICD-10-CM | POA: Diagnosis not present

## 2019-09-04 DIAGNOSIS — R58 Hemorrhage, not elsewhere classified: Secondary | ICD-10-CM | POA: Diagnosis not present

## 2019-09-04 DIAGNOSIS — W1839XA Other fall on same level, initial encounter: Secondary | ICD-10-CM | POA: Diagnosis present

## 2019-09-04 DIAGNOSIS — Z66 Do not resuscitate: Secondary | ICD-10-CM | POA: Diagnosis present

## 2019-09-04 DIAGNOSIS — Z20822 Contact with and (suspected) exposure to covid-19: Secondary | ICD-10-CM | POA: Diagnosis present

## 2019-09-04 DIAGNOSIS — R531 Weakness: Secondary | ICD-10-CM

## 2019-09-04 DIAGNOSIS — Y92009 Unspecified place in unspecified non-institutional (private) residence as the place of occurrence of the external cause: Secondary | ICD-10-CM

## 2019-09-04 DIAGNOSIS — W19XXXA Unspecified fall, initial encounter: Secondary | ICD-10-CM | POA: Diagnosis not present

## 2019-09-04 DIAGNOSIS — R404 Transient alteration of awareness: Secondary | ICD-10-CM | POA: Diagnosis not present

## 2019-09-04 DIAGNOSIS — I4510 Unspecified right bundle-branch block: Secondary | ICD-10-CM | POA: Diagnosis present

## 2019-09-04 DIAGNOSIS — S0522XA Ocular laceration and rupture with prolapse or loss of intraocular tissue, left eye, initial encounter: Secondary | ICD-10-CM | POA: Diagnosis present

## 2019-09-04 DIAGNOSIS — H4312 Vitreous hemorrhage, left eye: Secondary | ICD-10-CM | POA: Diagnosis present

## 2019-09-04 DIAGNOSIS — M6281 Muscle weakness (generalized): Secondary | ICD-10-CM | POA: Diagnosis not present

## 2019-09-04 DIAGNOSIS — Z7902 Long term (current) use of antithrombotics/antiplatelets: Secondary | ICD-10-CM | POA: Diagnosis not present

## 2019-09-04 DIAGNOSIS — Z79899 Other long term (current) drug therapy: Secondary | ICD-10-CM | POA: Diagnosis not present

## 2019-09-04 DIAGNOSIS — N952 Postmenopausal atrophic vaginitis: Secondary | ICD-10-CM | POA: Diagnosis present

## 2019-09-04 DIAGNOSIS — S0592XA Unspecified injury of left eye and orbit, initial encounter: Secondary | ICD-10-CM

## 2019-09-04 DIAGNOSIS — Z87440 Personal history of urinary (tract) infections: Secondary | ICD-10-CM | POA: Diagnosis not present

## 2019-09-04 LAB — BASIC METABOLIC PANEL
Anion gap: 10 (ref 5–15)
BUN: 19 mg/dL (ref 8–23)
CO2: 23 mmol/L (ref 22–32)
Calcium: 7.9 mg/dL — ABNORMAL LOW (ref 8.9–10.3)
Chloride: 102 mmol/L (ref 98–111)
Creatinine, Ser: 0.9 mg/dL (ref 0.44–1.00)
GFR calc Af Amer: >60 mL/min (ref >60)
GFR calc non Af Amer: 54 mL/min — ABNORMAL LOW (ref >60)
Glucose, Bld: 160 mg/dL — ABNORMAL HIGH (ref 70–99)
Potassium: 4.5 mmol/L (ref 3.5–5.1)
Sodium: 135 mmol/L (ref 135–145)

## 2019-09-04 LAB — ECHOCARDIOGRAM COMPLETE
Height: 67 in
Weight: 2080 oz

## 2019-09-04 LAB — CBC
HCT: 33.2 % — ABNORMAL LOW (ref 36.0–46.0)
Hemoglobin: 10.6 g/dL — ABNORMAL LOW (ref 12.0–15.0)
MCH: 30.2 pg (ref 26.0–34.0)
MCHC: 31.9 g/dL (ref 30.0–36.0)
MCV: 94.6 fL (ref 80.0–100.0)
Platelets: 202 10*3/uL (ref 150–400)
RBC: 3.51 MIL/uL — ABNORMAL LOW (ref 3.87–5.11)
RDW: 14.2 % (ref 11.5–15.5)
WBC: 5 10*3/uL (ref 4.0–10.5)
nRBC: 0 % (ref 0.0–0.2)

## 2019-09-04 MED ORDER — PREDNISOLONE ACETATE 1 % OP SUSP
1.0000 [drp] | Freq: Four times a day (QID) | OPHTHALMIC | Status: DC
  Administered 2019-09-04 – 2019-09-05 (×3): 1 [drp] via OPHTHALMIC
  Filled 2019-09-04: qty 5

## 2019-09-04 MED ORDER — OFLOXACIN 0.3 % OP SOLN
1.0000 [drp] | Freq: Four times a day (QID) | OPHTHALMIC | Status: DC
  Administered 2019-09-04 – 2019-09-05 (×3): 1 [drp] via OPHTHALMIC
  Filled 2019-09-04: qty 5

## 2019-09-04 MED ORDER — DORZOLAMIDE HCL-TIMOLOL MAL 2-0.5 % OP SOLN
1.0000 [drp] | Freq: Two times a day (BID) | OPHTHALMIC | Status: DC
  Administered 2019-09-04 – 2019-09-05 (×2): 1 [drp] via OPHTHALMIC
  Filled 2019-09-04: qty 10

## 2019-09-04 NOTE — Evaluation (Signed)
Physical Therapy Evaluation Patient Details Name: Lisa [name] MRN: [id number] DOB: October 31, 1921 Today's Date: 09/04/2019   History of Present Illness  Pt is a 86 y/o female admitted after sustaining a fall at home due to a syncopal episode. Head CT was unremarkable. Of note, pt s/p Scleral laceration repair OS. PMH including but not limited to chronic UTIs and stroke    Clinical Impression  Pt presented supine in bed with HOB elevated, awake and willing to participate in therapy session. Prior to admission, pt reported that she ambulated with use of a cane PRN and was independent with all ADLs. Pt lives alone in a single level home with a couple of steps to enter. She stated that she only has one daughter that lives close enough to be able to assist her and would like to d/c to SNF. At the time of evaluation, pt able to perform bed mobility with supervision and transfers with mod A. Pt with very slow and cautious movement. Very self aware and agreeable to SNF for short-term rehab prior to returning home. Pt would continue to benefit from skilled physical therapy services at this time while admitted and after d/c to address the below listed limitations in order to improve overall safety and independence with functional mobility.     Follow Up Recommendations SNF;Other (comment) (pt requesting [facility])    Equipment Recommendations  None recommended by PT    Recommendations for Other Services       Precautions / Restrictions Precautions Precautions: Fall Precaution Comments: L eye patch from surgery Restrictions Weight Bearing Restrictions: No      Mobility  Bed Mobility Overal bed mobility: Needs Assistance Bed Mobility: Supine to Sit     Supine to sit: Supervision     General bed mobility comments: increased time and effort, no physical assistance needed  Transfers Overall transfer level: Needs assistance Equipment used: 1 person hand held assist Transfers: Sit  to/from [employer] Transfers Sit to Stand: Mod assist Stand pivot transfers: Mod assist       General transfer comment: assistance needed to power into standing and for pivotal movement from bed to chair  Ambulation/Gait                Stairs            Wheelchair Mobility    Modified Rankin (Stroke Patients Only)       Balance Overall balance assessment: Needs assistance Sitting-balance support: Feet supported Sitting balance-Leahy Scale: Good     Standing balance support: During functional activity;Bilateral upper extremity supported;Single extremity supported Standing balance-Leahy Scale: Poor                               Pertinent Vitals/Pain Pain Assessment: No/denies pain    Home Living Family/patient expects to be discharged to:: Skilled nursing facility                 Additional Comments: pt requesting to go to rehab (would prefer [facility])    Prior Function Level of Independence: Independent with assistive device(s)         Comments: ambulated with use of a cane PRN     Hand Dominance        Extremity/Trunk Assessment   Upper Extremity Assessment Upper Extremity Assessment: Generalized weakness    Lower Extremity Assessment Lower Extremity Assessment: Generalized weakness    Cervical / Trunk Assessment Cervical / Trunk  Assessment: Kyphotic  Communication   Communication: No difficulties  Cognition Arousal/Alertness: Awake/alert Behavior During Therapy: WFL for tasks assessed/performed Overall Cognitive Status: Within Functional Limits for tasks assessed                                        General Comments      Exercises     Assessment/Plan    PT Assessment Patient needs continued PT services  PT Problem List Decreased strength;Decreased activity tolerance;Decreased balance;Decreased mobility;Decreased coordination;Decreased knowledge of use of DME;Decreased safety  awareness;Decreased knowledge of precautions       PT Treatment Interventions DME instruction;Gait training;Stair training;Functional mobility training;Therapeutic activities;Therapeutic exercise;Neuromuscular re-education;Balance training;Patient/family education    PT Goals (Current goals can be found in the Care Plan section)  Acute Rehab PT Goals Patient Stated Goal: to go to rehab and get stronger PT Goal Formulation: With patient Time For Goal Achievement: 09/18/19 Potential to Achieve Goals: Good    Frequency Min 2X/week   Barriers to discharge        Co-evaluation               AM-PAC PT "6 Clicks" Mobility  Outcome Measure Help needed turning from your back to your side while in a flat bed without using bedrails?: None Help needed moving from lying on your back to sitting on the side of a flat bed without using bedrails?: None Help needed moving to and from a bed to a chair (including a wheelchair)?: A Lot Help needed standing up from a chair using your arms (e.g., wheelchair or bedside chair)?: A Lot Help needed to walk in hospital room?: A Lot Help needed climbing 3-5 steps with a railing? : Total 6 Click Score: 15    End of Session Equipment Utilized During Treatment: Gait belt Activity Tolerance: Patient tolerated treatment well Patient left: in chair;with call bell/phone within reach;with chair alarm set Nurse Communication: Mobility status PT Visit Diagnosis: Other abnormalities of gait and mobility (R26.89)    Time: 7902-4097 PT Time Calculation (min) (ACUTE ONLY): 15 min   Charges:   PT Evaluation $PT Eval Moderate Complexity: 1 Mod          Ginette Pitman, PT, DPT  Acute Rehabilitation Services Pager [phone] Office [phone]    Alessandra Bevels Nathania Waldman 09/04/2019, 10:07 AM

## 2019-09-04 NOTE — Progress Notes (Signed)
Echocardiogram 2D Echocardiogram has been performed.  Lisa [name] 09/04/2019, 9:11 AM

## 2019-09-04 NOTE — TOC Initial Note (Signed)
Transition of Care [facility]) - Initial/Assessment Note    Patient Details  Name: Lisa [name] MRN: [id number] Date of Birth: 08/31/1921  Transition of Care [facility]) CM/SW Contact:    Lisa Mt, LCSW Phone Number: 09/04/2019, 5:07 PM  Clinical Narrative:                 CSW spoke with pt and [name] at bedside. introduced self, role, reason for visit. Pt from home, confirmed home address and PCP. She has an adult daughter here and two other adult children out of state. Pt daughter requested I add mobile number [phone].   Pt preferred SNF is [facility], they are able to offer. Pt aware of possible d/c tomorrow.   Auth was started and received today. Josem Kaufmann #[id number]. 6/17-6/21. No new COVID needed. Pt has been vaccinated.   Expected Discharge Plan: Skilled Nursing Facility Barriers to Discharge: Insurance Authorization, Continued Medical Work up   Patient Goals and CMS Choice Patient states their goals for this hospitalization and ongoing recovery are:: get stronger and return home CMS Medicare.gov Compare Post Acute Care list provided to:: Patient (pt states preference for [facility]) Choice offered to / list presented to : Patient, Adult Children  Expected Discharge Plan and Services Expected Discharge Plan: [place] In-house Referral: Clinical Social Work Discharge Planning Services: CM Consult Post Acute Care Choice: [place] Living arrangements for the past 2 months: Apartment                  Prior Living Arrangements/Services Living arrangements for the past 2 months: Apartment Lives with:: Self Patient language and need for interpreter reviewed:: Yes (no needs) Do you feel safe going back to the place where you live?: Yes      Need for Family Participation in Patient Care: Yes (Comment) (assistance as needed) Care giver support system in place?: Yes (comment) (adult daughter) Current home services: DME Criminal  Activity/Legal Involvement Pertinent to Current Situation/Hospitalization: No - Comment as needed  Permission Sought/Granted Permission sought to share information with : Facility Sport and exercise psychologist, Family Supports Permission granted to share information with : Yes, Verbal Permission Granted  Share Information with NAME: Lisa [name]  Permission granted to share info w AGENCY: [place] granted to share info w Relationship: daughter  Permission granted to share info w Contact Information: [phone]  Emotional Assessment Appearance:: Appears stated age Attitude/Demeanor/Rapport: Engaged, Gracious Affect (typically observed): Accepting, Adaptable, Pleasant Orientation: : Oriented to Place, Oriented to Self, Oriented to  Time, Oriented to Situation Alcohol / Substance Use: Not Applicable Psych Involvement:  (n/a)  Admission diagnosis:  Dehydration [E86.0] Left eye injury, initial encounter [S05.92XA] Syncope, unspecified syncope type [R55] Syncope and collapse [R55] Orthostatic syncope [I95.1] Patient Active Problem List   Diagnosis Date Noted  . Orthostatic syncope 09/04/2019  . Scleral laceration of left eye 09/03/2019  . Fall at home, initial encounter 09/03/2019  . Syncope and collapse 09/03/2019  . Generalized weakness 09/03/2019  . Recurrent UTI 09/03/2019  . Abnormal LFTs 09/03/2019   PCP:  Lisa Curry, DO Pharmacy:   [place], [place] - [address] [address] Phone: [phone] Fax: [phone]  [facility], [address] [place], [address] [place], [address] [place] [address] Phone: [phone] Fax: [phone]   Readmission Risk Interventions Readmission Risk Prevention Plan 09/04/2019  Post Dischage Appt Not  Complete  Medication Screening Complete  Transportation Screening Complete

## 2019-09-04 NOTE — Progress Notes (Addendum)
BRIEF PROGRESS NOTE  Lisa [name] 1921/10/04 09/04/2019  S: S/p open globe repair OS. No eye pain. VA remains blurry. C/o pain in her vaginal area and is asking for relief.  O: Left eye: VA hand motion, iris corectopia nasally, [facility] with conjunctiva covering sutured scleral wound nasally, IOP 30, chamber formed  A/P:  1) Traumatic scleral laceration OS - s/p repair on 09/03/2019 - Overall doing well, although prognosis guarded. - Start prednisolone acetate 1% solution QID OS. - Start ofloxacin ophthalmic solution QID OS. - Start dorzolamide-timolol BID OS. - Will need to see me in the office in 4-7 days at:  [facility] [address] [place], [place] [address] [phone]  Okay for discharge from ophthalmology perspective.  R Fabian Sharp, MD

## 2019-09-04 NOTE — Progress Notes (Addendum)
PROGRESS NOTE  Lisa [name] ZHY:[id number] DOB: [birth year] DOA: 09/03/2019 PCP: Gayland Curry, DO   LOS: 0 days   Brief narrative: As per HPI,  Lisa [name] is a 86 y.o. female with no significant past medical history except for recurrent UTIs since November 2020, who at baseline lives independently but now has declining functional status with generalized weakness believed related to her recurrent UTIs who presented to the emergency room following a syncopal episode at home.  Patient with baseline ambulates with a walker, was moving around in her home and suddenly fell, losing consciousness.  Daughter was at the bedside and is contributing to the history.  On coming around, patient started experiencing intense pain and difficulty with vision from the left eye.  She denied any one-sided weakness numbness or tingling.   On arrival in the ER, patient was awake and alert but complaining of pain of the left eye.  Vital signs were within normal limits.  Blood work significant for creatinine of 1.29 and elevated liver enzymes.  Baseline unknown.  WBC normal at 5.9, hemoglobin 11.  Urinalysis unremarkable.  CT head and orbits unremarkable.  Patient was evaluated by ophthalmology who found scleral laceration of the left eye to be taking patient to the OR emergently.  Assessment/Plan:  Active Problems:   Scleral laceration of left eye   Fall at home, initial encounter   Syncope and collapse   Generalized weakness   Recurrent UTI   Abnormal LFTs  Syncope and collapse History of orthostatic hypotension.  Continue telemetry monitor.  Pending carotid Doppler, echocardiogram. Head CT was unremarkable. EKG unremarkable.  Will get PT, OT evaluation.   Traumatic Scleral laceration of left eye Post scleral repair by ophthalmology on 09/03/2019.  Management as per ophthalmology.  On prednisone, ofloxacin and dorzolamide- timolol drops.  Patient is supposed to follow-up with [facility] in 4 to 7 days.    Generalized weakness Likely from physical deconditioning.  No UTI at this time.  Patient ambulates with the help of a cane.  Family and the patient is requesting rehabilitation.  Will get PT OT evaluation. Likely need SNF    History of recurrent UTI No evidence of UTI at this time. Will dc rocephin    Abnormal LFTs Ultrasound of the right upper quadrant with gallbladder sludge.  Hepatic steatosis. Trend in am.  Vaginal pain.  History of atrophic vaginitis and urinary bladder issues.  Follows up with urology as outpatient.  Uses estrogen at home.  Appears to be a chronic issue.  Was advised to follow-up with urology as outpatient.  No evidence of UTI at this time.  DVT prophylaxis: enoxaparin (LOVENOX) injection 30 mg Start: 09/04/19 1500  Code Status: DNR  Family Communication: None today.  Status is: Observation  The patient will require care spanning > 2 midnights and should be moved to inpatient because: Unsafe d/c plan and Deconditioning, debility likely need rehabilitation., studies pending  Dispo: The patient is from: Home              Anticipated d/c is to: SNF              Anticipated d/c date is: 2 days              Patient currently is medically stable to d/c.  Consultants:  Ophthalmology  Procedures: Left traumatic scleral repair by ophthalmology on 09/03/2019.  Antibiotics:  . Ceftriaxone -6/16>6/17  Subjective: Today, patient was seen and examined at  bedside.  Patient complains of vaginal pain.  States that she has had recurrent UTIs and was seen by urology as outpatient.  Was supposed to follow-up with urology as well.  Denies any ocular pain.  Objective: Vitals:   09/04/19 0237 09/04/19 0536  BP: 140/70 137/69  Pulse: 65 70  Resp: 16 16  Temp: 97.7 F (36.5 C) (!) 97.5 F (36.4 C)  SpO2: 100% 99%    Intake/Output Summary (Last 24 hours) at 09/04/2019 0757 Last data filed at 09/04/2019 0540 Gross per 24 hour  Intake  1789.25 ml  Output 1225 ml  Net 564.25 ml   Filed Weights   09/03/19 1357  Weight: 59 kg   Body mass index is 20.36 kg/m.   Physical Exam: GENERAL: Patient is alert awake and oriented. Not in obvious distress.  Afebrile. HENT:  Oral mucosa is moist. Surgical occlusion of left eye status post surgery. Status post scleral repair NECK: is supple, no gross swelling noted. CHEST: Clear to auscultation. No crackles or wheezes.   CVS: S1 and S2 heard, no murmur. Regular rate and rhythm.  ABDOMEN: Soft, non-tender, bowel sounds are present. EXTREMITIES: No edema. CNS: Cranial nerves are intact. No focal motor deficits. SKIN: warm and dry without rashes.  Data Review: I have personally reviewed the following laboratory data and studies,  CBC: Recent Labs  Lab 09/03/19 1400 09/04/19 0156  WBC 5.9 5.0  NEUTROABS 4.5  --   HGB 11.0* 10.6*  HCT 34.9* 33.2*  MCV 97.2 94.6  PLT 217 202   Basic Metabolic Panel: Recent Labs  Lab 09/03/19 1400 09/04/19 0156  NA 134* 135  K 4.1 4.5  CL 101 102  CO2 23 23  GLUCOSE 197* 160*  BUN 25* 19  CREATININE 1.29* 0.90  CALCIUM 8.5* 7.9*   Liver Function Tests: Recent Labs  Lab 09/03/19 1400  AST 45*  ALT 202*  ALKPHOS 162*  BILITOT 1.4*  PROT 5.9*  ALBUMIN 3.4*   No results for input(s): LIPASE, AMYLASE in the last 168 hours. No results for input(s): AMMONIA in the last 168 hours. Cardiac Enzymes: No results for input(s): CKTOTAL, CKMB, CKMBINDEX, TROPONINI in the last 168 hours. BNP (last 3 results) No results for input(s): BNP in the last 8760 hours.  ProBNP (last 3 results) No results for input(s): PROBNP in the last 8760 hours.  CBG: No results for input(s): GLUCAP in the last 168 hours. Recent Results (from the past 240 hour(s))  SARS Coronavirus 2 by RT PCR (hospital order, performed in [facility] hospital lab) Nasopharyngeal Nasopharyngeal Swab     Status: None   Collection Time: 09/03/19  5:57 PM   Specimen:  Nasopharyngeal Swab  Result Value Ref Range Status   SARS Coronavirus 2 NEGATIVE NEGATIVE Final    Comment: (NOTE) SARS-CoV-2 target nucleic acids are NOT DETECTED.  The SARS-CoV-2 RNA is generally detectable in upper and lower respiratory specimens during the acute phase of infection. The lowest concentration of SARS-CoV-2 viral copies this assay can detect is 250 copies / mL. A negative result does not preclude SARS-CoV-2 infection and should not be used as the sole basis for treatment or other patient management decisions.  A negative result may occur with improper specimen collection / handling, submission of specimen other than nasopharyngeal swab, presence of viral mutation(s) within the areas targeted by this assay, and inadequate number of viral copies (<250 copies / mL). A negative result must be combined with clinical observations, patient history, and epidemiological  information.  Fact Sheet for Patients:   BoilerBrush.com.cy  Fact Sheet for Healthcare Providers: https://pope.com/  This test is not yet approved or  cleared by the [place] FDA and has been authorized for detection and/or diagnosis of SARS-CoV-2 by FDA under an Emergency Use Authorization (EUA).  This EUA will remain in effect (meaning this test can be used) for the duration of the COVID-19 declaration under Section 564(b)(1) of the Act, 21 U.S.C. section 360bbb-3(b)(1), unless the authorization is terminated or revoked sooner.  Performed at [facility], [address], [place], [place] [address]      Studies: CT HEAD WO CONTRAST  Result Date: 09/03/2019 CLINICAL DATA:  Syncope, left-sided facial swelling EXAM: CT HEAD WITHOUT CONTRAST TECHNIQUE: Contiguous axial images were obtained from the base of the skull through the vertex without intravenous contrast. COMPARISON:  None. FINDINGS: Brain: Focal hypodensities are seen within the right  basal ganglia and left corona radiata consistent with chronic lacunar infarcts. Hypodensities in the bilateral periventricular white matter consistent with age-indeterminate small vessel ischemic change. No other signs of acute infarct or hemorrhage. The lateral ventricles and midline structures are unremarkable. No acute extra-axial fluid collections. No mass effect. Vascular: No hyperdense vessel or unexpected calcification. Skull: Normal. Negative for fracture or focal lesion. Sinuses/Orbits: Mucosal thickening is seen within the right maxillary sinus. Remaining sinuses are clear. Other: None. IMPRESSION: 1. Likely chronic small-vessel ischemic changes throughout the white matter and basal ganglia as above. 2. No acute intracranial trauma. Electronically Signed   By: Sharlet Salina M.D.   On: 09/03/2019 16:05   DG Chest Port 1 View  Result Date: 09/03/2019 CLINICAL DATA:  Syncope and fall, denies chest pain or shortness of breath EXAM: PORTABLE CHEST 1 VIEW COMPARISON:  None. FINDINGS: Mild hyperinflation with relative apical lucency and coarsened interstitial changes towards the bases. No consolidation, features of edema, pneumothorax, or effusion. The aorta is calcified. The remaining cardiomediastinal contours are unremarkable. No acute osseous or soft tissue abnormality. Degenerative changes are present in the imaged spine and shoulders. IMPRESSION: No acute cardiopulmonary or traumatic findings within the chest. Hyperinflation and coarsened interstitium. Can be seen as chronic features. Aortic Atherosclerosis (ICD10-I70.0). Electronically Signed   By: Kreg Shropshire M.D.   On: 09/03/2019 15:04   CT Orbits Wo Contrast  Result Date: 09/03/2019 CLINICAL DATA:  Syncope, ophthalmoplegia, left-sided facial swelling EXAM: CT ORBITS WITHOUT CONTRAST TECHNIQUE: Multidetector CT images were obtained using the standard protocol without intravenous contrast. COMPARISON:  None. FINDINGS: Orbits: No traumatic or  inflammatory finding. Globes, optic nerves, orbital fat, extraocular muscles, vascular structures, and lacrimal glands are normal. Visualized sinuses: Mucosal thickening right maxillary sinus. Remaining sinuses are clear. Soft tissues: Negative. Limited intracranial: No significant or unexpected finding. IMPRESSION: 1. No acute displaced fracture.  Unremarkable orbits and globes. Electronically Signed   By: Sharlet Salina M.D.   On: 09/03/2019 16:08   US Abdomen Limited RUQ  Result Date: 09/03/2019 CLINICAL DATA:  Abnormal liver enzymes EXAM: ULTRASOUND ABDOMEN LIMITED RIGHT UPPER QUADRANT COMPARISON:  None. FINDINGS: Gallbladder: Layering gallbladder sludge is seen. No sonographic Murphy sign noted by sonographer. Common bile duct: Diameter: 5 mm Liver: Increased echotexture seen throughout. No focal abnormality or biliary ductal dilatation. Portal vein is patent on color Doppler imaging with normal direction of blood flow towards the liver. Other: None. IMPRESSION: Layering gallbladder sludge.  No evidence of acute cholecystitis Hepatic steatosis Electronically Signed   By: Jonna Clark M.D.   On: 09/03/2019  23:38      Joycelyn Das, MD  [facility] 09/04/2019

## 2019-09-04 NOTE — Progress Notes (Signed)
Carotid duplex       has been completed. Preliminary results can be found under CV proc through chart review. Trayden Brandy, BS, RDMS, RVT   

## 2019-09-04 NOTE — NC FL2 (Signed)
[place] MEDICAID FL2 LEVEL OF CARE SCREENING TOOL     IDENTIFICATION  Patient Name: Lisa [name] Birthdate: Apr 14, 1921 Sex: female Admission Date (Current Location): 09/03/2019  [facility] and [place] Number:  Producer, television/film/video and Address:  [facility] [place], [address], [place], [place] [address]      Provider Number: [id number]  Attending Physician Name and Address:  Joycelyn Das, MD  Relative Name and Phone Number:       Current Level of Care: Hospital Recommended Level of Care: Skilled Nursing Facility Prior Approval Number:    Date Approved/Denied:   PASRR Number: [id number]  Discharge Plan: SNF    Current Diagnoses: Patient Active Problem List   Diagnosis Date Noted  . Scleral laceration of left eye 09/03/2019  . Fall at home, initial encounter 09/03/2019  . Syncope and collapse 09/03/2019  . Generalized weakness 09/03/2019  . Recurrent UTI 09/03/2019  . Abnormal LFTs 09/03/2019    Orientation RESPIRATION BLADDER Height & Weight     Self, Time, Situation, Place  O2 (nasal canula) Incontinent Weight: 130 lb (59 kg) Height:  5\' 7"  (170.2 cm)  BEHAVIORAL SYMPTOMS/MOOD NEUROLOGICAL BOWEL NUTRITION STATUS      Continent Diet (see discharge summary)  AMBULATORY STATUS COMMUNICATION OF NEEDS Skin   Extensive Assist Verbally Skin abrasions, Surgical wounds (abrasion on R hip; closed incision on L eye with patch)                       Personal Care Assistance Level of Assistance  Feeding, Dressing, Bathing Bathing Assistance: Maximum assistance Feeding assistance: Independent Dressing Assistance: Maximum assistance     Functional Limitations Info  Sight, Hearing, Speech Sight Info: Adequate Hearing Info: Adequate Speech Info: Adequate    SPECIAL CARE FACTORS FREQUENCY  OT (By licensed OT), PT (By licensed PT)     PT Frequency: 5x week OT Frequency: 5x week            Contractures Contractures Info: Not  present    Additional Factors Info  Code Status, Allergies Code Status Info: DNR Allergies Info: Ace Inhibitors, Lisinopril, Simvastatin, Statins           Current Medications (09/04/2019):  This is the current hospital active medication list Current Facility-Administered Medications  Medication Dose Route Frequency Provider Last Rate Last Admin  . 0.9 %  sodium chloride infusion   Intravenous Continuous 09/06/2019, MD 75 mL/hr at 09/03/19 2354 New Bag at 09/03/19 2354  . acetaminophen (TYLENOL) tablet 650 mg  650 mg Oral Q6H PRN 09/05/19, MD       Or  . acetaminophen (TYLENOL) suppository 650 mg  650 mg Rectal Q6H PRN Andris Baumann, MD      . B-complex with vitamin C tablet 1 tablet  1 tablet Oral Daily Andris Baumann, MD   1 tablet at 09/04/19 1022  . carvedilol (COREG) tablet 6.25 mg  6.25 mg Oral BID WC 09/06/19, MD   6.25 mg at 09/04/19 0741  . cholecalciferol (VITAMIN D3) tablet 400 Units  400 Units Oral Daily 09/06/19, MD   400 Units at 09/04/19 1022  . clopidogrel (PLAVIX) tablet 75 mg  75 mg Oral Daily 09/06/19, MD   75 mg at 09/04/19 1022  . conjugated estrogens (PREMARIN) vaginal cream 1 Applicatorful  1 Applicatorful Vaginal Daily 09/06/19, FNP   1 Applicatorful at 09/04/19 0247  . enoxaparin (  LOVENOX) injection 30 mg  30 mg Subcutaneous Daily Judd Gaudier V, MD      . fluticasone [facility]) 50 MCG/ACT nasal spray 2 spray  2 spray Each Nare Daily PRN Athena Masse, MD      . HYDROcodone-acetaminophen (NORCO/VICODIN) 5-325 MG per tablet 1-2 tablet  1-2 tablet Oral Q4H PRN Athena Masse, MD   2 tablet at 09/04/19 0741  . lactated ringers infusion   Intravenous Continuous Blanchie Dessert, MD 75 mL/hr at 09/03/19 1730 New Bag at 09/03/19 1730  . morphine 2 MG/ML injection 2 mg  2 mg Intravenous Q2H PRN Athena Masse, MD   2 mg at 09/03/19 2242  . multivitamin (PROSIGHT) tablet 1 tablet  1 tablet Oral BID Athena Masse, MD   1  tablet at 09/04/19 1022  . ondansetron (ZOFRAN) tablet 4 mg  4 mg Oral Q6H PRN Athena Masse, MD       Or  . ondansetron [facility]) injection 4 mg  4 mg Intravenous Q6H PRN Athena Masse, MD   4 mg at 09/04/19 0554     Discharge Medications: Please see discharge summary for a list of discharge medications.  Relevant Imaging Results:  Relevant Lab Results:   Additional Information SS[id number] [place], [place]

## 2019-09-05 ENCOUNTER — Telehealth: Payer: Self-pay | Admitting: Internal Medicine

## 2019-09-05 DIAGNOSIS — S0530XD Ocular laceration without prolapse or loss of intraocular tissue, unspecified eye, subsequent encounter: Secondary | ICD-10-CM | POA: Diagnosis not present

## 2019-09-05 DIAGNOSIS — Z7401 Bed confinement status: Secondary | ICD-10-CM | POA: Diagnosis not present

## 2019-09-05 DIAGNOSIS — W19XXXA Unspecified fall, initial encounter: Secondary | ICD-10-CM | POA: Diagnosis not present

## 2019-09-05 DIAGNOSIS — Z79899 Other long term (current) drug therapy: Secondary | ICD-10-CM | POA: Diagnosis not present

## 2019-09-05 DIAGNOSIS — I10 Essential (primary) hypertension: Secondary | ICD-10-CM | POA: Diagnosis not present

## 2019-09-05 DIAGNOSIS — R5381 Other malaise: Secondary | ICD-10-CM | POA: Diagnosis not present

## 2019-09-05 DIAGNOSIS — D649 Anemia, unspecified: Secondary | ICD-10-CM | POA: Diagnosis not present

## 2019-09-05 DIAGNOSIS — Z743 Need for continuous supervision: Secondary | ICD-10-CM | POA: Diagnosis not present

## 2019-09-05 DIAGNOSIS — R531 Weakness: Secondary | ICD-10-CM | POA: Diagnosis not present

## 2019-09-05 DIAGNOSIS — M6281 Muscle weakness (generalized): Secondary | ICD-10-CM | POA: Diagnosis not present

## 2019-09-05 DIAGNOSIS — R300 Dysuria: Secondary | ICD-10-CM | POA: Diagnosis not present

## 2019-09-05 DIAGNOSIS — N390 Urinary tract infection, site not specified: Secondary | ICD-10-CM | POA: Diagnosis not present

## 2019-09-05 DIAGNOSIS — I779 Disorder of arteries and arterioles, unspecified: Secondary | ICD-10-CM | POA: Diagnosis not present

## 2019-09-05 DIAGNOSIS — R404 Transient alteration of awareness: Secondary | ICD-10-CM | POA: Diagnosis not present

## 2019-09-05 DIAGNOSIS — M2550 Pain in unspecified joint: Secondary | ICD-10-CM | POA: Diagnosis not present

## 2019-09-05 DIAGNOSIS — Z0189 Encounter for other specified special examinations: Secondary | ICD-10-CM | POA: Diagnosis not present

## 2019-09-05 DIAGNOSIS — H34831 Tributary (branch) retinal vein occlusion, right eye, with macular edema: Secondary | ICD-10-CM | POA: Diagnosis not present

## 2019-09-05 DIAGNOSIS — R55 Syncope and collapse: Secondary | ICD-10-CM | POA: Diagnosis not present

## 2019-09-05 DIAGNOSIS — R319 Hematuria, unspecified: Secondary | ICD-10-CM | POA: Diagnosis not present

## 2019-09-05 DIAGNOSIS — R945 Abnormal results of liver function studies: Secondary | ICD-10-CM | POA: Diagnosis not present

## 2019-09-05 DIAGNOSIS — M6282 Rhabdomyolysis: Secondary | ICD-10-CM | POA: Diagnosis not present

## 2019-09-05 DIAGNOSIS — H348122 Central retinal vein occlusion, left eye, stable: Secondary | ICD-10-CM | POA: Diagnosis not present

## 2019-09-05 DIAGNOSIS — R58 Hemorrhage, not elsewhere classified: Secondary | ICD-10-CM | POA: Diagnosis not present

## 2019-09-05 DIAGNOSIS — Z9181 History of falling: Secondary | ICD-10-CM | POA: Diagnosis not present

## 2019-09-05 DIAGNOSIS — E860 Dehydration: Secondary | ICD-10-CM | POA: Diagnosis not present

## 2019-09-05 DIAGNOSIS — I951 Orthostatic hypotension: Secondary | ICD-10-CM | POA: Diagnosis not present

## 2019-09-05 DIAGNOSIS — S0532XA Ocular laceration without prolapse or loss of intraocular tissue, left eye, initial encounter: Secondary | ICD-10-CM | POA: Diagnosis not present

## 2019-09-05 LAB — COMPREHENSIVE METABOLIC PANEL
ALT: 94 U/L — ABNORMAL HIGH (ref 0–44)
AST: 17 U/L (ref 15–41)
Albumin: 2.7 g/dL — ABNORMAL LOW (ref 3.5–5.0)
Alkaline Phosphatase: 106 U/L (ref 38–126)
Anion gap: 7 (ref 5–15)
BUN: 23 mg/dL (ref 8–23)
CO2: 24 mmol/L (ref 22–32)
Calcium: 8.1 mg/dL — ABNORMAL LOW (ref 8.9–10.3)
Chloride: 103 mmol/L (ref 98–111)
Creatinine, Ser: 1.14 mg/dL — ABNORMAL HIGH (ref 0.44–1.00)
GFR calc Af Amer: 47 mL/min — ABNORMAL LOW (ref >60)
GFR calc non Af Amer: 41 mL/min — ABNORMAL LOW (ref >60)
Glucose, Bld: 118 mg/dL — ABNORMAL HIGH (ref 70–99)
Potassium: 3.9 mmol/L (ref 3.5–5.1)
Sodium: 134 mmol/L — ABNORMAL LOW (ref 135–145)
Total Bilirubin: 0.9 mg/dL (ref 0.3–1.2)
Total Protein: 4.9 g/dL — ABNORMAL LOW (ref 6.5–8.1)

## 2019-09-05 LAB — CBC
HCT: 32 % — ABNORMAL LOW (ref 36.0–46.0)
Hemoglobin: 10.2 g/dL — ABNORMAL LOW (ref 12.0–15.0)
MCH: 30.4 pg (ref 26.0–34.0)
MCHC: 31.9 g/dL (ref 30.0–36.0)
MCV: 95.2 fL (ref 80.0–100.0)
Platelets: 209 10*3/uL (ref 150–400)
RBC: 3.36 MIL/uL — ABNORMAL LOW (ref 3.87–5.11)
RDW: 14.2 % (ref 11.5–15.5)
WBC: 5.3 10*3/uL (ref 4.0–10.5)
nRBC: 0 % (ref 0.0–0.2)

## 2019-09-05 LAB — MAGNESIUM: Magnesium: 1.8 mg/dL (ref 1.7–2.4)

## 2019-09-05 MED ORDER — DORZOLAMIDE HCL-TIMOLOL MAL 2-0.5 % OP SOLN: 1.0000 [drp] | Freq: Two times a day (BID) | OPHTHALMIC | 0 refills | Status: AC

## 2019-09-05 MED ORDER — TRAMADOL HCL 50 MG PO TABS: 50.0000 mg | ORAL_TABLET | Freq: Two times a day (BID) | ORAL | 0 refills | Status: DC | PRN

## 2019-09-05 MED ORDER — ESTROGENS, CONJUGATED 0.625 MG/GM VA CREA: 1.0000 | TOPICAL_CREAM | Freq: Every day | VAGINAL | Status: DC

## 2019-09-05 MED ORDER — OFLOXACIN 0.3 % OP SOLN: 1.0000 [drp] | Freq: Four times a day (QID) | OPHTHALMIC | 0 refills | Status: DC

## 2019-09-05 MED ORDER — PREDNISOLONE ACETATE 1 % OP SUSP: 1.0000 [drp] | Freq: Four times a day (QID) | OPHTHALMIC | 0 refills | Status: DC

## 2019-09-05 NOTE — Telephone Encounter (Signed)
Mrs. Lisa [name] is being discharged to [facility] for rehab after her syncope and eye injury.  Can you please reach out to Olegario Messier, her daughter, to make sure that when she is nearing discharge home that they call for an appt with [place]?  Thanks.

## 2019-09-05 NOTE — Social Work (Signed)
Clinical Social Worker facilitated patient discharge including contacting patient family and facility to confirm patient discharge plans.  Clinical information faxed to facility and family agreeable with plan.  CSW arranged ambulance transport via [facility] to [facility] RN to call [phone]  with report prior to discharge.  Clinical Social Worker will sign off for now as social work intervention is no longer needed. Please consult us again if new need arises.  Elissia Spiewak, MSW, LCSW Clinical Social Worker   

## 2019-09-05 NOTE — Evaluation (Signed)
Occupational Therapy Evaluation Patient Details Name: Lisa [name] MRN: [id number] DOB: [birth year] Today's Date: 09/05/2019    History of Present Illness Pt is a 86 y/o female admitted after sustaining a fall at home due to a syncopal episode. Head CT was unremarkable. Of note, pt s/p Scleral laceration repair OS. PMH including but not limited to chronic UTIs and stroke   Clinical Impression   PTA pt living alone, with intermittent assist from daughter. At time of eval, pt completing BADL mod independently with use of cane, but had 2 falls PTA. At time of eval, pt able to complete bed mobility at supervision level and transfers at min guard with RW. Pt stood at sink to complete grooming activities with one seated rest break due to feeling signs of orthostasis. This quickly passed once seated. Pt with good awareness as to how this may contribute to increased fall risk. She then completed toilet transfer and returned to recliner. Anticipate pt will not need SNF level of care for long amount of time. Will continue to follow per POC listed below.     Follow Up Recommendations  SNF    Equipment Recommendations  None recommended by OT    Recommendations for Other Services       Precautions / Restrictions Precautions Precautions: Fall Precaution Comments: L eye patch from surgery Restrictions Weight Bearing Restrictions: No      Mobility Bed Mobility Overal bed mobility: Needs Assistance Bed Mobility: Supine to Sit     Supine to sit: Supervision     General bed mobility comments: increased time and effort  Transfers Overall transfer level: Needs assistance Equipment used: 1 person hand held assist Transfers: Sit to/from Stand;Stand Pivot Transfers Sit to Stand: Min guard Stand pivot transfers: Min guard       General transfer comment: min guard for safety and cues for safe hand placement on RW. Pt able to standing without physical assist, needing only close guarding for  safety    Balance Overall balance assessment: Needs assistance Sitting-balance support: Feet supported Sitting balance-Leahy Scale: Good     Standing balance support: During functional activity;Bilateral upper extremity supported;Single extremity supported Standing balance-Leahy Scale: Poor Standing balance comment: reliant on external support                           ADL either performed or assessed with clinical judgement   ADL Overall ADL's : Needs assistance/impaired Eating/Feeding: Set up;Sitting   Grooming: Min guard;Standing;Oral care;Wash/dry face Grooming Details (indicate cue type and reason): standing up at sink for oral care and face washing. Pt able to stand for ~75% of task before needing seated rest break. Reports feeling nauseous, which passed once sitting Upper Body Bathing: Set up;Sitting   Lower Body Bathing: Minimal assistance;Sit to/from stand;Sitting/lateral leans   Upper Body Dressing : Set up;Sitting   Lower Body Dressing: Minimal assistance;Sit to/from stand;Sitting/lateral leans   Toilet Transfer: Min guard;Regular Toilet;Grab bars;RW;Ambulation [employer] forces technical [employer] Details (indicate cue type and reason): pt completed functional mobility to bathroom for toilet transfer, able to complete anterior peri care Toileting- Clothing Manipulation and Hygiene: Set up;Sitting/lateral lean   Tub/ Shower Transfer: Minimal assistance;Ambulation;Shower seat   Functional mobility during ADLs: Min guard;Rolling walker;Cueing for safety General ADL Comments: pt begins to feel signs of orthostasis with prolonged activity     Vision Baseline Vision/History: Macular Degeneration Patient Visual Report: Eye fatigue/eye pain/headache Additional Comments: L eye occluded after surgery. Reports difficulty adjusting to  monocular vision     Perception     Praxis      Pertinent Vitals/Pain Pain Assessment: Faces Faces Pain Scale: Hurts a little bit Pain Location:  vagina Pain Descriptors / Indicators: Aching Pain Intervention(s): Monitored during session     Hand Dominance     Extremity/Trunk Assessment Upper Extremity Assessment Upper Extremity Assessment: Generalized weakness   Lower Extremity Assessment Lower Extremity Assessment: Generalized weakness       Communication Communication Communication: No difficulties   Cognition Arousal/Alertness: Awake/alert Behavior During Therapy: WFL for tasks assessed/performed Overall Cognitive Status: Within Functional Limits for tasks assessed                                     General Comments       Exercises     Shoulder Instructions      Home Living Family/patient expects to be discharged to:: Skilled nursing facility                                 Additional Comments: pt requesting to go to rehab (would prefer [facility])      Prior Functioning/Environment Level of Independence: Independent with assistive device(s)        Comments: ambulated with use of a cane PRN. Had 2 falls prior to admission        OT Problem List: Decreased strength;Impaired vision/perception;Decreased knowledge of use of DME or AE;Decreased activity tolerance;Impaired balance (sitting and/or standing)      OT Treatment/Interventions: Self-care/ADL training;Therapeutic exercise;Patient/family education;Balance training;Energy conservation;Therapeutic activities;DME and/or AE instruction    OT Goals(Current goals can be found in the care plan section) Acute Rehab OT Goals Patient Stated Goal: to go to rehab and get stronger OT Goal Formulation: With patient Time For Goal Achievement: 09/19/19 Potential to Achieve Goals: Good  OT Frequency: Min 2X/week   Barriers to D/C:            Co-evaluation              AM-PAC OT "6 Clicks" Daily Activity     Outcome Measure Help from another person eating meals?: None Help from another person taking care of  personal grooming?: A Little Help from another person toileting, which includes using toliet, bedpan, or urinal?: A Little Help from another person bathing (including washing, rinsing, drying)?: A Little Help from another person to put on and taking off regular upper body clothing?: A Little Help from another person to put on and taking off regular lower body clothing?: A Little 6 Click Score: 19   End of Session Equipment Utilized During Treatment: Rolling walker;Gait belt Nurse Communication: Mobility status  Activity Tolerance: Patient tolerated treatment well Patient left: in chair;with call bell/phone within reach;with chair alarm set  OT Visit Diagnosis: Other abnormalities of gait and mobility (R26.89);Muscle weakness (generalized) (M62.81);Unsteadiness on feet (R26.81);History of falling (Z91.81)                Time: 6644-0347 OT Time Calculation (min): 27 min Charges:  OT General Charges $OT Visit: 1 Visit OT Evaluation $OT Eval Moderate Complexity: 1 Mod OT Treatments $Self Care/Home Management : 8-22 mins  Dalphine Handing, MSOT, OTR/L Acute Rehabilitation Services [facility] Office Number: [phone] Pager: [phone]  Dalphine Handing 09/05/2019, 9:55 AM

## 2019-09-05 NOTE — Telephone Encounter (Signed)
Patient daughter notified and agreed.  

## 2019-09-05 NOTE — Progress Notes (Signed)
Pt is discharged to [facility] via [facility].  Attempted to give report to admitting RN twice.  Discharge information and prescription sent with [facility].

## 2019-09-05 NOTE — Discharge Summary (Addendum)
Physician Discharge Summary  Idania Desouza Reneger ZOX:[id number] DOB: 03/13/1922 DOA: 09/03/2019  PCP: Kermit Balo, DO  Admit date: 09/03/2019 Discharge date: 09/05/2019  Admitted From: Home  Discharge disposition: SNF  Recommendations for Outpatient Follow-Up:    Follow up with your primary care provider in one week.   Check CBC, BMP, in the next visit  Follow-up with Dr. Dione Booze, ophthalmology in 4 to 5 days for eye surgery follow-up.  Follow-up with urology Dr. Arita Miss for urological issues.  Patient had an outpatient appointment recently.  Take precautions especially on standing up from lying or sitting down position.  Patient has a history of orthostatic hypotension.   Discharge Diagnosis:   Active Problems:   Scleral laceration of left eye   Fall at home, initial encounter   Syncope and collapse   Generalized weakness   Recurrent UTI   Abnormal LFTs   Orthostatic syncope   Discharge Condition: Improved.  Diet recommendation:.  Regular.  Code status: DNR   History of Present Illness:  Lisa [name] a 86 y.o.femalewithno significant past medical history except for recurrent UTIs since November 2020, who at baseline lives independently but now has declining functional status with generalized weakness believedrelated to her recurrent UTIs who presented to the emergency room following a syncopal episode at home. Patient with baseline ambulates with a walker, was moving around in her home and suddenly fell, losing consciousness. Daughter was at the bedside and is contributing to the history. On coming around, patient started experiencing intense pain and difficulty with vision from the left eye. She denied any one-sided weakness numbness or tingling.  On arrival in the ER, patient was awake and alert but complaining of pain of the left eye. Vital signs were within normal limits. Blood work significant for creatinine of 1.29 and elevated liver enzymes.  Baseline unknown. WBC normal at 5.9, hemoglobin 11. Urinalysis unremarkable. CT head and orbits unremarkable. Patient was evaluated by ophthalmology who found scleral lacerationof the left eye to be taking patient to the OR emergently.  Hospital Course:   Following conditions were addressed during hospitalization as listed below,  Syncope and collapse History of orthostatic hypotension.  Continue telemetry monitor. Carotid Doppler with less than 39% stenosis in bilateral internal carotid but more than 50% stenosis on the external carotid arteries bilaterally.  2D echocardiogram shows LV ejection fraction of 60 to 65% with no regional wall motion abnormalities. Head CT was unremarkable. EKG unremarkable.  She was seen by physical therapy and who recommended skilled nursing facility placement on discharge for rehabilitation.   Traumatic Scleral laceration of left eye Post scleral repair by ophthalmology on 09/03/2019.  Management as per ophthalmology.  On prednisone, ofloxacin and dorzolamide- timolol drops.  Patient is supposed to follow-up with [facility] in 4 to 7 days. Spoke with the patient's daughter about the follow.   Generalized weakness Likely from physical deconditioning.  No UTI at this time.  Patient ambulates with the help of a cane.  PT recommending SNF at this time.  History of recurrent UTI No evidence of UTI at this time.   Abnormal LFTs Ultrasound of the right upper quadrant with gallbladder sludge.  Hepatic steatosis.  Trending down LFT.  Vaginal/bladder pain.  History of atrophic vaginitis and urinary bladder issues.  Follows up with Dr Arita Miss urology as outpatient.  Uses estrogen vaginal cream at home.  Appears to be a chronic issue.  Was advised to follow-up with urology as outpatient.  No evidence  of UTI at this time.  Disposition.  At this time, patient is stable for disposition to skilled nursing facility.  Spoke with the patient's daughter  on the phone and updated her about the clinical condition of the patient and the plan for follow-up with ophthalmology and urology as outpatient.  Medical Consultants:    Ophthalmology  Procedures:    Left traumatic scleral repair by ophthalmology on 09/03/2019.  Subjective:   Today, patient feels okay.  Denies any eye pain.  Denies any fever, chills or rigor.  Discharge Exam:   Vitals:   09/04/19 2100 09/05/19 0426  BP: (!) 152/65 (!) 166/74  Pulse: 71 68  Resp: 18 16  Temp: 98 F (36.7 C) 98.5 F (36.9 C)  SpO2: 95% 96%   Vitals:   09/04/19 1211 09/04/19 1726 09/04/19 2100 09/05/19 0426  BP: (!) 143/67 (!) 184/78 (!) 152/65 (!) 166/74  Pulse: 62 69 71 68  Resp: 18 17 18 16   Temp: 98.1 F (36.7 C) 98.3 F (36.8 C) 98 F (36.7 C) 98.5 F (36.9 C)  TempSrc: Oral Oral Oral Oral  SpO2: 99% 98% 95% 96%  Weight:      Height:        Patient is alert awake and oriented. Not in obvious distress.  Afebrile. HENT:  Oral mucosa is moist. Surgical occlusion of left eye status post surgery. Status post scleral repair NECK: is supple, no gross swelling noted. CHEST: Clear to auscultation. No crackles or wheezes.   CVS: S1 and S2 heard, no murmur. Regular rate and rhythm.  ABDOMEN: Soft, non-tender, bowel sounds are present. EXTREMITIES: No edema. CNS: Cranial nerves are intact. No focal motor deficits. SKIN: warm and dry without rashes..  The results of significant diagnostics from this hospitalization (including imaging, microbiology, ancillary and laboratory) are listed below for reference.     Diagnostic Studies:   CT HEAD WO CONTRAST  Result Date: 09/03/2019 CLINICAL DATA:  Syncope, left-sided facial swelling EXAM: CT HEAD WITHOUT CONTRAST TECHNIQUE: Contiguous axial images were obtained from the base of the skull through the vertex without intravenous contrast. COMPARISON:  None. FINDINGS: Brain: Focal hypodensities are seen within the right basal ganglia and left  corona radiata consistent with chronic lacunar infarcts. Hypodensities in the bilateral periventricular white matter consistent with age-indeterminate small vessel ischemic change. No other signs of acute infarct or hemorrhage. The lateral ventricles and midline structures are unremarkable. No acute extra-axial fluid collections. No mass effect. Vascular: No hyperdense vessel or unexpected calcification. Skull: Normal. Negative for fracture or focal lesion. Sinuses/Orbits: Mucosal thickening is seen within the right maxillary sinus. Remaining sinuses are clear. Other: None. IMPRESSION: 1. Likely chronic small-vessel ischemic changes throughout the white matter and basal ganglia as above. 2. No acute intracranial trauma. Electronically Signed   By: Randa Ngo M.D.   On: 09/03/2019 16:05   DG Chest Port 1 View  Result Date: 09/03/2019 CLINICAL DATA:  Syncope and fall, denies chest pain or shortness of breath EXAM: PORTABLE CHEST 1 VIEW COMPARISON:  None. FINDINGS: Mild hyperinflation with relative apical lucency and coarsened interstitial changes towards the bases. No consolidation, features of edema, pneumothorax, or effusion. The aorta is calcified. The remaining cardiomediastinal contours are unremarkable. No acute osseous or soft tissue abnormality. Degenerative changes are present in the imaged spine and shoulders. IMPRESSION: No acute cardiopulmonary or traumatic findings within the chest. Hyperinflation and coarsened interstitium. Can be seen as chronic features. Aortic Atherosclerosis (ICD10-I70.0). Electronically Signed   By: March Rummage  [facility] M.D.   On: 09/03/2019 15:04   ECHOCARDIOGRAM COMPLETE  Result Date: 09/04/2019    ECHOCARDIOGRAM REPORT   Patient Name:   Edgardo RoysKATHLEEN N RENEGER Date of Exam: 09/04/2019 Medical Rec #:  [id number]          Height:       67.0 in Accession #:    [id number]         Weight:       130.0 lb Date of Birth:  09/25/1921           BSA:          1.684 m Patient Age:    89 years            BP:           137/69 mmHg Patient Gender: F                  HR:           79 bpm. Exam Location:  Inpatient Procedure: 2D Echo, Color Doppler and Cardiac Doppler Indications:    R55 Syncope  History:        Patient has prior history of Echocardiogram examinations, most                 recent 07/18/2017. Risk Factors:Hypertension and Dyslipidemia.  Sonographer:    Irving BurtonEmily Senior RDCS Referring Phys: [id number] Andris BaumannHAZEL V DUNCAN  Sonographer Comments: Technically difficult due to small rib spacing. IMPRESSIONS  1. Left ventricular ejection fraction, by estimation, is 60 to 65%. The left ventricle has normal function. The left ventricle has no regional wall motion abnormalities. There is mild left ventricular hypertrophy of the basal-septal segment. Left ventricular diastolic parameters are consistent with Grade I diastolic dysfunction (impaired relaxation).  2. Right ventricular systolic function is normal. The right ventricular size is normal. There is normal pulmonary artery systolic pressure.  3. Left atrial size was mild to moderately dilated.  4. The mitral valve is normal in structure. No evidence of mitral valve regurgitation.  5. The aortic valve is normal in structure. Aortic valve regurgitation is not visualized. Mild aortic valve sclerosis is present, with no evidence of aortic valve stenosis.  6. The inferior vena cava is normal in size with greater than 50% respiratory variability, suggesting right atrial pressure of 3 mmHg. FINDINGS  Left Ventricle: Left ventricular ejection fraction, by estimation, is 60 to 65%. The left ventricle has normal function. The left ventricle has no regional wall motion abnormalities. The left ventricular internal cavity size was normal in size. There is  mild left ventricular hypertrophy of the basal-septal segment. Left ventricular diastolic parameters are consistent with Grade I diastolic dysfunction (impaired relaxation). Right Ventricle: The right ventricular size is  normal. No increase in right ventricular wall thickness. Right ventricular systolic function is normal. There is normal pulmonary artery systolic pressure. The tricuspid regurgitant velocity is 2.29 m/s, and  with an assumed right atrial pressure of 3 mmHg, the estimated right ventricular systolic pressure is 24.0 mmHg. Left Atrium: Left atrial size was mild to moderately dilated. Right Atrium: Right atrial size was normal in size. Pericardium: There is no evidence of pericardial effusion. Mitral Valve: The mitral valve is normal in structure. No evidence of mitral valve regurgitation. Tricuspid Valve: The tricuspid valve is normal in structure. Tricuspid valve regurgitation is trivial. Aortic Valve: The aortic valve is normal in structure. Aortic valve regurgitation is not visualized. Mild aortic valve sclerosis is present, with no evidence of aortic  valve stenosis. There is mild calcification of the aortic valve. Pulmonic Valve: The pulmonic valve was grossly normal. Pulmonic valve regurgitation is not visualized. Aorta: The aortic root and ascending aorta are structurally normal, with no evidence of dilitation. Venous: The inferior vena cava is normal in size with greater than 50% respiratory variability, suggesting right atrial pressure of 3 mmHg. IAS/Shunts: No atrial level shunt detected by color flow Doppler.  LEFT VENTRICLE PLAX 2D LVIDd:         3.20 cm  Diastology LVIDs:         2.10 cm  LV e' lateral:   3.81 cm/s LV PW:         1.00 cm  LV E/e' lateral: 15.2 LV IVS:        1.30 cm  LV e' medial:    3.37 cm/s LVOT diam:     2.00 cm  LV E/e' medial:  17.2 LV SV:         68 LV SV Index:   40 LVOT Area:     3.14 cm  RIGHT VENTRICLE RV S prime:     11.60 cm/s TAPSE (M-mode): 2.5 cm LEFT ATRIUM             Index       RIGHT ATRIUM           Index LA diam:        2.10 cm 1.25 cm/m  RA Area:     11.50 cm LA Vol (A2C):   53.6 ml 31.83 ml/m RA Volume:   23.70 ml  14.07 ml/m LA Vol (A4C):   37.2 ml 22.09 ml/m  LA Biplane Vol: 45.9 ml 27.26 ml/m  AORTIC VALVE LVOT Vmax:   96.00 cm/s LVOT Vmean:  68.500 cm/s LVOT VTI:    0.216 m  AORTA Ao Root diam: 3.30 cm Ao Asc diam:  3.20 cm MITRAL VALVE                TRICUSPID VALVE MV Area (PHT): 2.85 cm     TR Peak grad:   21.0 mmHg MV Decel Time: 266 msec     TR Vmax:        229.00 cm/s MV E velocity: 58.10 cm/s MV A velocity: 123.00 cm/s  SHUNTS MV E/A ratio:  0.47         Systemic VTI:  0.22 m                             Systemic Diam: 2.00 cm Arvilla Meres MD Electronically signed by Arvilla Meres MD Signature Date/Time: 09/04/2019/2:24:50 PM    Final    VAS US CAROTID  Result Date: 09/04/2019 Carotid Arterial Duplex Study Indications:  Syncope. Risk Factors: None. Performing Technologist: Jeb Levering RDMS, RVT  Examination Guidelines: A complete evaluation includes B-mode imaging, spectral Doppler, color Doppler, and power Doppler as needed of all accessible portions of each vessel. Bilateral testing is considered an integral part of a complete examination. Limited examinations for reoccurring indications may be performed as noted.  Right Carotid Findings: +----------+--------+--------+--------+------------------+--------+             PSV cm/s EDV cm/s Stenosis Plaque Description Comments  +----------+--------+--------+--------+------------------+--------+  CCA Prox   90       18                hypoechoic                   +----------+--------+--------+--------+------------------+--------+  CCA Distal 85       12                                             +----------+--------+--------+--------+------------------+--------+  ICA Prox   94       21       1-39%    heterogenous                 +----------+--------+--------+--------+------------------+--------+  ICA Distal 75       17                                             +----------+--------+--------+--------+------------------+--------+  ECA        310      14       >50%     heterogenous                  +----------+--------+--------+--------+------------------+--------+ +----------+--------+-------+----------------+-------------------+             PSV cm/s EDV cms Describe         Arm Pressure (mmHG)  +----------+--------+-------+----------------+-------------------+  Subclavian 141              Multiphasic, WNL                      +----------+--------+-------+----------------+-------------------+ +---------+--------+--+--------+-+---------+  Vertebral PSV cm/s 51 EDV cm/s 7 Antegrade  +---------+--------+--+--------+-+---------+  Left Carotid Findings: +----------+--------+--------+--------+------------------+--------+             PSV cm/s EDV cm/s Stenosis Plaque Description Comments  +----------+--------+--------+--------+------------------+--------+  CCA Prox   109      28                hypoechoic                   +----------+--------+--------+--------+------------------+--------+  CCA Distal 94       27                                             +----------+--------+--------+--------+------------------+--------+  ICA Prox   139      26       1-39%    heterogenous                 +----------+--------+--------+--------+------------------+--------+  ICA Distal 94       31                                             +----------+--------+--------+--------+------------------+--------+  ECA        355      5        >50%     heterogenous                 +----------+--------+--------+--------+------------------+--------+ +----------+--------+--------+--------+-------------------+             PSV cm/s EDV cm/s Describe Arm Pressure (mmHG)  +----------+--------+--------+--------+-------------------+  Subclavian 146                                             +----------+--------+--------+--------+-------------------+ +---------+--------+--+--------+--+  Vertebral PSV cm/s 59 EDV cm/s 11  +---------+--------+--+--------+--+   Summary: Right Carotid: Velocities in the right ICA are consistent with a 1-39% stenosis.                 The ECA appears >50% stenosed. Left Carotid: Velocities in the left ICA are consistent with a 1-39% stenosis.               The ECA appears >50% stenosed.  *See table(s) above for measurements and observations.  Electronically signed by Sherald Hess MD on 09/04/2019 at 3:14:18 PM.    Final    CT Orbits Wo Contrast  Result Date: 09/03/2019 CLINICAL DATA:  Syncope, ophthalmoplegia, left-sided facial swelling EXAM: CT ORBITS WITHOUT CONTRAST TECHNIQUE: Multidetector CT images were obtained using the standard protocol without intravenous contrast. COMPARISON:  None. FINDINGS: Orbits: No traumatic or inflammatory finding. Globes, optic nerves, orbital fat, extraocular muscles, vascular structures, and lacrimal glands are normal. Visualized sinuses: Mucosal thickening right maxillary sinus. Remaining sinuses are clear. Soft tissues: Negative. Limited intracranial: No significant or unexpected finding. IMPRESSION: 1. No acute displaced fracture.  Unremarkable orbits and globes. Electronically Signed   By: Sharlet Salina M.D.   On: 09/03/2019 16:08   US Abdomen Limited RUQ  Result Date: 09/03/2019 CLINICAL DATA:  Abnormal liver enzymes EXAM: ULTRASOUND ABDOMEN LIMITED RIGHT UPPER QUADRANT COMPARISON:  None. FINDINGS: Gallbladder: Layering gallbladder sludge is seen. No sonographic Murphy sign noted by sonographer. Common bile duct: Diameter: 5 mm Liver: Increased echotexture seen throughout. No focal abnormality or biliary ductal dilatation. Portal vein is patent on color Doppler imaging with normal direction of blood flow towards the liver. Other: None. IMPRESSION: Layering gallbladder sludge.  No evidence of acute cholecystitis Hepatic steatosis Electronically Signed   By: Jonna Clark M.D.   On: 09/03/2019 23:38     Labs:   Basic Metabolic Panel: Recent Labs  Lab 09/03/19 1400 09/03/19 1400 09/04/19 0156 09/05/19 0242  NA 134*  --  135 134*  K 4.1   < > 4.5 3.9  CL 101  --  102 103    CO2 23  --  23 24  GLUCOSE 197*  --  160* 118*  BUN 25*  --  19 23  CREATININE 1.29*  --  0.90 1.14*  CALCIUM 8.5*  --  7.9* 8.1*  MG  --   --   --  1.8   < > = values in this interval not displayed.   GFR Estimated Creatinine Clearance: 26.9 mL/min (A) (by C-G formula based on SCr of 1.14 mg/dL (H)). Liver Function Tests: Recent Labs  Lab 09/03/19 1400 09/05/19 0242  AST 45* 17  ALT 202* 94*  ALKPHOS 162* 106  BILITOT 1.4* 0.9  PROT 5.9* 4.9*  ALBUMIN 3.4* 2.7*   No results for input(s): LIPASE, AMYLASE in the last 168 hours. No results for input(s): AMMONIA in the last 168 hours. Coagulation profile No results for input(s): INR, PROTIME in the last 168 hours.  CBC: Recent Labs  Lab 09/03/19 1400 09/04/19 0156 09/05/19 0242  WBC 5.9 5.0 5.3  NEUTROABS 4.5  --   --   HGB 11.0* 10.6* 10.2*  HCT 34.9* 33.2* 32.0*  MCV 97.2 94.6 95.2  PLT 217 202 209   Cardiac Enzymes: No results for input(s): CKTOTAL, CKMB, CKMBINDEX, TROPONINI in the last 168 hours. BNP: Invalid input(s): POCBNP CBG: No results for input(s): GLUCAP in the last 168 hours. D-Dimer No results for input(s): DDIMER in the  last 72 hours. Hgb A1c No results for input(s): HGBA1C in the last 72 hours. Lipid Profile No results for input(s): CHOL, HDL, LDLCALC, TRIG, CHOLHDL, LDLDIRECT in the last 72 hours. Thyroid function studies No results for input(s): TSH, T4TOTAL, T3FREE, THYROIDAB in the last 72 hours.  Invalid input(s): FREET3 Anemia work up No results for input(s): VITAMINB12, FOLATE, FERRITIN, TIBC, IRON, RETICCTPCT in the last 72 hours. Microbiology Recent Results (from the past 240 hour(s))  SARS Coronavirus 2 by RT PCR (hospital order, performed in [facility] hospital lab) Nasopharyngeal Nasopharyngeal Swab     Status: None   Collection Time: 09/03/19  5:57 PM   Specimen: Nasopharyngeal Swab  Result Value Ref Range Status   SARS Coronavirus 2 NEGATIVE NEGATIVE Final    Comment:  (NOTE) SARS-CoV-2 target nucleic acids are NOT DETECTED.  The SARS-CoV-2 RNA is generally detectable in upper and lower respiratory specimens during the acute phase of infection. The lowest concentration of SARS-CoV-2 viral copies this assay can detect is 250 copies / mL. A negative result does not preclude SARS-CoV-2 infection and should not be used as the sole basis for treatment or other patient management decisions.  A negative result may occur with improper specimen collection / handling, submission of specimen other than nasopharyngeal swab, presence of viral mutation(s) within the areas targeted by this assay, and inadequate number of viral copies (<250 copies / mL). A negative result must be combined with clinical observations, patient history, and epidemiological information.  Fact Sheet for Patients:   BoilerBrush.com.cy  Fact Sheet for Healthcare Providers: https://pope.com/  This test is not yet approved or  cleared by the [place] FDA and has been authorized for detection and/or diagnosis of SARS-CoV-2 by FDA under an Emergency Use Authorization (EUA).  This EUA will remain in effect (meaning this test can be used) for the duration of the COVID-19 declaration under Section 564(b)(1) of the Act, 21 U.S.C. section 360bbb-3(b)(1), unless the authorization is terminated or revoked sooner.  Performed at [facility], [address] [place], [place] [address]      Discharge Instructions:   Discharge Instructions    Call MD for:  severe uncontrolled pain   Complete by: As directed    Call MD for:  temperature >100.4   Complete by: As directed    Diet - low sodium heart healthy   Complete by: As directed    Discharge instructions   Complete by: As directed    Follow up with primary care provider at the skilled nursing facility in 3 to 5 days.  Follow-up with ophthalmology Dr Dione Booze in 4 to 5 days at  [facility] [address] [place], [place] [address] [phone]   Increase activity slowly   Complete by: As directed    Leave dressing on - Keep it clean, dry, and intact until clinic visit   Complete by: As directed      Allergies as of 09/05/2019      Reactions   Ace Inhibitors Cough   Lisinopril Cough   Simvastatin Other (See Comments)   Body aches and pains   Statins Other (See Comments)   Body aches and pains      Medication List    TAKE these medications   amoxicillin 500 MG capsule Commonly known as: AMOXIL Take 2,000 mg by mouth See admin instructions. Take 2,000 mg by mouth one hour prior to dental procedures   b complex vitamins tablet Take 1  tablet by mouth daily.   carvedilol 6.25 MG tablet Commonly known as: COREG Take 6.25 mg by mouth 2 (two) times daily.   clopidogrel 75 MG tablet Commonly known as: PLAVIX Take 75 mg by mouth daily.   conjugated estrogens vaginal cream Commonly known as: PREMARIN Place 1 Applicatorful vaginally daily. Start taking on: September 06, 2019   diphenhydramine-acetaminophen 25-500 MG Tabs tablet Commonly known as: TYLENOL PM Take 2 tablets by mouth at bedtime.   dorzolamide-timolol 22.3-6.8 MG/ML ophthalmic solution Commonly known as: COSOPT Place 1 drop into the left eye 2 (two) times daily.   fluticasone 50 MCG/ACT nasal spray Commonly known as: FLONASE Place 2 sprays into both nostrils daily as needed for allergies or rhinitis.   Myrbetriq 25 MG Tb24 tablet Generic drug: mirabegron ER Take 25 mg by mouth at bedtime.   ofloxacin 0.3 % ophthalmic solution Commonly known as: OCUFLOX Place 1 drop into the left eye 4 (four) times daily.   prednisoLONE acetate 1 % ophthalmic suspension Commonly known as: PRED FORTE Place 1 drop into the left eye 4 (four) times daily.   PreserVision AREDS 2+Multi Vit Caps Take 1 capsule by mouth 2 (two) times daily.   traMADol 50 MG tablet Commonly known as:  ULTRAM Take 1 tablet (50 mg total) by mouth 2 (two) times daily as needed for moderate pain (for pain). What changed: reasons to take this   Vitamin D3 10 MCG (400 UNIT) Caps Take 400 Units by mouth daily.            Discharge Care Instructions  (From admission, onward)         Start     Ordered   09/05/19 0000  Leave dressing on - Keep it clean, dry, and intact until clinic visit        09/05/19 1005          Contact information for follow-up providers    [facility], Bertram Millard, MD. Schedule an appointment as soon as possible for a visit.   Specialty: Ophthalmology Why: 4-5 days for ey surgery followup Contact information: [address] [place] [address] [phone]            Contact information for after-discharge care    Destination    [facility] .   Service: Skilled Nursing Contact information: [address] [place] [place] [address] [phone]                   Time coordinating discharge: 39 minutes  Signed:  Shiana Rappleye  [facility] 09/05/2019, 10:08 AM

## 2019-09-05 NOTE — Plan of Care (Signed)
  Problem: Coping: Goal: Level of anxiety will decrease Outcome: Progressing   Problem: Elimination: Goal: Will not experience complications related to bowel motility Outcome: Progressing   Problem: Pain Managment: Goal: General experience of comfort will improve Outcome: Progressing   

## 2019-09-08 ENCOUNTER — Encounter (INDEPENDENT_AMBULATORY_CARE_PROVIDER_SITE_OTHER): Payer: Self-pay | Admitting: Ophthalmology

## 2019-09-09 DIAGNOSIS — R55 Syncope and collapse: Secondary | ICD-10-CM | POA: Diagnosis not present

## 2019-09-09 DIAGNOSIS — I951 Orthostatic hypotension: Secondary | ICD-10-CM | POA: Diagnosis not present

## 2019-09-09 DIAGNOSIS — N390 Urinary tract infection, site not specified: Secondary | ICD-10-CM | POA: Diagnosis not present

## 2019-09-09 DIAGNOSIS — I779 Disorder of arteries and arterioles, unspecified: Secondary | ICD-10-CM | POA: Diagnosis not present

## 2019-09-11 DIAGNOSIS — H348122 Central retinal vein occlusion, left eye, stable: Secondary | ICD-10-CM | POA: Diagnosis not present

## 2019-09-11 DIAGNOSIS — S0530XD Ocular laceration without prolapse or loss of intraocular tissue, unspecified eye, subsequent encounter: Secondary | ICD-10-CM | POA: Diagnosis not present

## 2019-09-11 DIAGNOSIS — Z0189 Encounter for other specified special examinations: Secondary | ICD-10-CM | POA: Diagnosis not present

## 2019-09-11 DIAGNOSIS — I951 Orthostatic hypotension: Secondary | ICD-10-CM | POA: Diagnosis not present

## 2019-09-15 ENCOUNTER — Encounter (INDEPENDENT_AMBULATORY_CARE_PROVIDER_SITE_OTHER): Payer: Medicare Other | Admitting: Ophthalmology

## 2019-09-15 DIAGNOSIS — S0532XA Ocular laceration without prolapse or loss of intraocular tissue, left eye, initial encounter: Secondary | ICD-10-CM | POA: Diagnosis not present

## 2019-09-15 DIAGNOSIS — I10 Essential (primary) hypertension: Secondary | ICD-10-CM | POA: Diagnosis not present

## 2019-09-15 DIAGNOSIS — N390 Urinary tract infection, site not specified: Secondary | ICD-10-CM | POA: Diagnosis not present

## 2019-09-15 DIAGNOSIS — R55 Syncope and collapse: Secondary | ICD-10-CM | POA: Diagnosis not present

## 2019-09-16 ENCOUNTER — Encounter (INDEPENDENT_AMBULATORY_CARE_PROVIDER_SITE_OTHER): Payer: Self-pay | Admitting: Ophthalmology

## 2019-09-16 ENCOUNTER — Other Ambulatory Visit: Payer: Self-pay

## 2019-09-16 ENCOUNTER — Ambulatory Visit (INDEPENDENT_AMBULATORY_CARE_PROVIDER_SITE_OTHER): Payer: Medicare Other | Admitting: Ophthalmology

## 2019-09-16 DIAGNOSIS — S0532XD Ocular laceration without prolapse or loss of intraocular tissue, left eye, subsequent encounter: Secondary | ICD-10-CM

## 2019-09-16 DIAGNOSIS — H34831 Tributary (branch) retinal vein occlusion, right eye, with macular edema: Secondary | ICD-10-CM | POA: Diagnosis not present

## 2019-09-16 DIAGNOSIS — R300 Dysuria: Secondary | ICD-10-CM | POA: Diagnosis not present

## 2019-09-16 DIAGNOSIS — I951 Orthostatic hypotension: Secondary | ICD-10-CM | POA: Diagnosis not present

## 2019-09-16 DIAGNOSIS — I10 Essential (primary) hypertension: Secondary | ICD-10-CM | POA: Diagnosis not present

## 2019-09-16 DIAGNOSIS — S0522XD Ocular laceration and rupture with prolapse or loss of intraocular tissue, left eye, subsequent encounter: Secondary | ICD-10-CM | POA: Insufficient documentation

## 2019-09-16 MED ORDER — BEVACIZUMAB CHEMO INJECTION 1.25MG/0.05ML SYRINGE FOR KALEIDOSCOPE
1.2500 mg | INTRAVITREAL | Status: AC | PRN
  Administered 2019-09-16: 1.25 mg via INTRAVITREAL

## 2019-09-16 NOTE — Assessment & Plan Note (Addendum)
This is status post surgical repair, via Dr. Wallie Renshaw, stable  OS,, continues to heal very nicely.  Verbal report from Dr. Alden Hipp suggested vitreous hemorrhage.  This appears to be clearing with the retina intact, a attached and no choroidal detachments.  Recommend simple observation left eye follow-up with Dr. Alden Hipp as scheduled.

## 2019-09-16 NOTE — Progress Notes (Signed)
09/16/2019     CHIEF COMPLAINT Patient presents for Retina Follow Up   HISTORY OF PRESENT ILLNESS: Lisa [name] is a 86 y.o. female who presents to the clinic today for:   HPI    Retina Follow Up    Patient presents with  CRVO/BRVO.  In right eye.  Duration of 5 weeks.  Since onset it is stable.          Comments    5 week follow up - OCT OU, Poss Avastin OD Patient denies change in vision and overall has no complaints.        Last edited by Berenice BoutonWyatt, Lindsey on 09/16/2019  8:59 AM. (History)      Referring physician: Kermit Baloeed, Tiffany L, DO [address] [place] [id number]  HISTORICAL INFORMATION:   Selected notes from the MEDICAL RECORD NUMBER    Lab Results  Component Value Date   HGBA1C 5.7 (H) 12/26/2018     CURRENT MEDICATIONS: Current Outpatient Medications (Ophthalmic Drugs)  Medication Sig   dorzolamide-timolol (COSOPT) 22.3-6.8 MG/ML ophthalmic solution Place 1 drop into the left eye 2 (two) times daily.   ofloxacin (OCUFLOX) 0.3 % ophthalmic solution Place 1 drop into the left eye 4 (four) times daily.   prednisoLONE acetate (PRED FORTE) 1 % ophthalmic suspension Place 1 drop into the left eye 4 (four) times daily.   No current facility-administered medications for this visit. (Ophthalmic Drugs)   Current Outpatient Medications (Other)  Medication Sig   amoxicillin (AMOXIL) 500 MG capsule Take 2,000 mg by mouth See admin instructions. Take 2,000 mg by mouth one hour prior to dental procedures   b complex vitamins tablet Take 1 tablet by mouth daily.   carvedilol (COREG) 6.25 MG tablet Take one tablet by mouth two times daily with a meal   carvedilol (COREG) 6.25 MG tablet Take 6.25 mg by mouth 2 (two) times daily.   Cholecalciferol (VITAMIN D3) 10 MCG (400 UNIT) CAPS Take 400 Units by mouth daily.   clopidogrel (PLAVIX) 75 MG tablet TAKE 1 TABLET BY MOUTH  DAILY WITH BREAKFAST   clopidogrel (PLAVIX) 75 MG tablet Take 75 mg by mouth  daily.   conjugated estrogens (PREMARIN) vaginal cream Place 1 Applicatorful vaginally daily.   Cyanocobalamin (B-12 PO) Take 1 tablet by mouth daily.   diphenhydramine-acetaminophen (TYLENOL PM) 25-500 MG TABS tablet Take 2 tablets by mouth at bedtime as needed (for sleep).   diphenhydramine-acetaminophen (TYLENOL PM) 25-500 MG TABS tablet Take 2 tablets by mouth at bedtime.   fluticasone (FLONASE) 50 MCG/ACT nasal spray Place 2 sprays into both nostrils daily as needed for allergies or rhinitis.   Multiple Vitamins-Minerals (PRESERVISION AREDS 2+MULTI VIT PO) Take 2 capsules by mouth 2 (two) times daily.    Multiple Vitamins-Minerals (PRESERVISION AREDS 2+MULTI VIT) CAPS Take 1 capsule by mouth 2 (two) times daily.    MYRBETRIQ 25 MG TB24 tablet Take 25 mg by mouth at bedtime. (Patient not taking: Reported on 09/03/2019)   traMADol (ULTRAM) 50 MG tablet Take 1 tablet (50 mg total) by mouth every 12 (twelve) hours as needed.   traMADol (ULTRAM) 50 MG tablet Take 1 tablet (50 mg total) by mouth 2 (two) times daily as needed for moderate pain (for pain).   VITAMIN D PO Take 1 tablet by mouth daily.   No current facility-administered medications for this visit. (Other)      REVIEW OF SYSTEMS:    ALLERGIES Allergies  Allergen Reactions  Ace Inhibitors Cough   Ace Inhibitors Cough   Lisinopril Cough   Lisinopril Cough   Simvastatin Other (See Comments)    Body aches and pains   Statins Other (See Comments)    Body aches and pains    PAST MEDICAL HISTORY Past Medical History:  Diagnosis Date   Cervicalgia    Chronic kidney disease    Chronic kidney disease, stage III (moderate)    Dermatophytosis of groin and perianal area    Dyspnea    Dysrhythmia    Hyperlipidemia LDL goal < 100    Insomnia, unspecified    Muscle weakness (generalized)    Orthostatic hypotension    Osteoarthrosis, unspecified whether generalized or localized, lower leg     osteoarthritis - upper thoracic area, left knee.   Other abnormal glucose    Other B-complex deficiencies    Other malaise and fatigue    Palpitations    Senile cataract, unspecified    Shortness of breath    Stroke (HCC)    small "stroke" - right sided weakness for a day.   Stroke (HCC)    Tachycardia, unspecified    Undiagnosed cardiac murmurs    Unspecified essential hypertension    Unspecified vitamin D deficiency    Urinary tract infection    Past Surgical History:  Procedure Laterality Date   CATARACT EXTRACTION W/PHACO Bilateral 1994   Dr. Dione Booze   CATARACT EXTRACTION, BILATERAL Bilateral    EYE SURGERY     cataract   RUPTURED GLOBE EXPLORATION AND REPAIR Left 09/03/2019   Procedure: Scleral laceration repair LEFT EYE, exam under anesthesia LEFT EYE;  Surgeon: Olivia Canter, MD;  Location: [facility] OR;  Service: Ophthalmology;  Laterality: Left;   TOE AMPUTATION  2002   TONSILLECTOMY  1944   TONSILLECTOMY     younger yrs   TOTAL KNEE ARTHROPLASTY Left 01/15/2015   Procedure: LEFT TOTAL KNEE ARTHROPLASTY;  Surgeon: Eugenia Mcalpine, MD;  Location: [facility] ORS;  Service: Orthopedics;  Laterality: Left;   TUBAL LIGATION  1951    FAMILY HISTORY Family History  Problem Relation Age of Onset   Cerebral aneurysm Mother    Heart attack Father    Alzheimer's disease Sister    Emphysema Sister    Stroke Sister    Alzheimer's disease Sister    Arthritis Sister    Stroke Sister        Age 29    Lung cancer Son     SOCIAL HISTORY Social History   Tobacco Use   Smoking status: Never Smoker   Smokeless tobacco: Never Used  Building services engineer Use: Never used  Substance Use Topics   Alcohol use: Yes    Comment: rare social every 3 or 4 months wine   Drug use: No         OPHTHALMIC EXAM:  Base Eye Exam    Visual Acuity (Snellen - Linear)      Right Left   Dist [place] 20/60+2 20/400   Dist ph [place] 20/40-2 NI       Tonometry  (Tonopen, 9:10 AM)      Right Left   Pressure 9        Pupils      Pupils Dark Light Shape React APD   Right PERRL 3 2 Round Sluggish None   Left PERRL            Visual Fields (Counting fingers)      Left Right  Full       Extraocular Movement      Right Left    Full Full       Neuro/Psych    Oriented x3: Yes   Mood/Affect: Normal       Dilation    Right eye: 1.0% Mydriacyl, 2.5% Phenylephrine @ 9:10 AM        Slit Lamp and Fundus Exam    External Exam      Right Left   External Normal Normal       Slit Lamp Exam      Right Left   Lids/Lashes Normal Normal   Conjunctiva/Sclera White and quiet Conjunctiva closed and nasally with suture.   Cornea Clear Clear   Anterior Chamber Deep and quiet Deep and quiet   Iris Round and reactive Defect stented from 830 nasally.   Lens Posterior chamber intraocular lens Posterior chamber intraocular lens, capsular bag seen nasally.   Anterior Vitreous Normal Normal       Fundus Exam      Right Left   Posterior Vitreous Posterior vitreous detachment Normal, mild old white vitreous debris   Disc Normal Normal   C/D Ratio 0.25 0.3   Macula no macular thickening, Hard drusen, no exudates, no hemorrhage Retinal pigment epithelial mottling   Vessels Inferior macular branch retinal vein occlusion.  Much less thickening Normal   Periphery Normal Normal, attached and no choroidals          IMAGING AND PROCEDURES  Imaging and Procedures for 09/16/19  OCT, Retina - OU - Both Eyes       Right Eye Quality was good. Scan locations included subfoveal. Central Foveal Thickness: 279. Progression has been stable.   Left Eye Quality was good. Scan locations included subfoveal.   Notes -Less CME OD, status post intravitreal Avastin for BRVO, stable at 5 weeks will extend his interval to 6 weeks OD       Intravitreal Injection, Pharmacologic Agent - OD - Right Eye       Time Out 09/16/2019. 10:55 AM. Confirmed correct  patient, procedure, site, and patient consented.   Anesthesia Topical anesthesia was used. Anesthetic medications included Akten 3.5%.   Procedure Preparation included Ofloxacin , 10% betadine to eyelids, 5% betadine to ocular surface. A 30 gauge needle was used.   Injection:  1.25 mg Bevacizumab (AVASTIN) SOLN   NDC: [address]-6, Lot: [id number]   Route: Intravitreal, Site: Right Eye, Waste: 0 mg  Post-op Post injection exam found visual acuity of at least counting fingers. The patient tolerated the procedure well. There were no complications. The patient received written and verbal post procedure care education. Post injection medications were not given.                 ASSESSMENT/PLAN:  Branch retinal vein occlusion with macular edema of right eye OD, BRVO CME resolved and improved 5-week follow-up.  Scleral laceration of left eye This is status post surgical repair, via Dr. Wallie Renshaw, stable  OS,, continues to heal very nicely.  Verbal report from Dr. Alden Hipp suggested vitreous hemorrhage.  This appears to be clearing with the retina intact, a attached and no choroidal detachments.  Recommend simple observation left eye follow-up with Dr. Alden Hipp as scheduled.      ICD-10-CM   1. Branch retinal vein occlusion with macular edema of right eye  H34.8310 OCT, Retina - OU - Both Eyes    Intravitreal Injection, Pharmacologic Agent - OD - Right Eye  Bevacizumab (AVASTIN) SOLN 1.25 mg  2. Scleral laceration of left eye, subsequent encounter  S05.32XD   3. Prolapse of iris in recent wound, left, subsequent encounter  S05.22XD     1.  Vitreal Avastin treatment delivered OD 4 BRVO at 5-week interval, improved thus will extend interval of examination to 6 weeks  2.  Left eye was examined and found to have intact clear media cornea, anterior chamber and vitreous cavity.  Vitreous hemorrhage reported verbally to me Apparently has cleared and with the retina attached sites suggest  simple observation  3.  Ophthalmic Meds Ordered this visit:  Meds ordered this encounter  Medications   Bevacizumab (AVASTIN) SOLN 1.25 mg       Return in about 6 weeks (around 10/28/2019) for dilate, OD, AVASTIN OCT.  There are no Patient Instructions on file for this visit.   Explained the diagnoses, plan, and follow up with the patient and they expressed understanding.  Patient expressed understanding of the importance of proper follow up care.   Alford Highland Hibba Schram M.D. Diseases & Surgery of the Retina and Vitreous [facility] 09/16/19     Abbreviations: M myopia (nearsighted); A astigmatism; H hyperopia (farsighted); P presbyopia; Mrx spectacle prescription;  CTL contact lenses; OD right eye; OS left eye; OU both eyes  XT exotropia; ET esotropia; PEK punctate epithelial keratitis; PEE punctate epithelial erosions; DES dry eye syndrome; MGD meibomian gland dysfunction; ATs artificial tears; PFAT's preservative free artificial tears; NSC nuclear sclerotic cataract; PSC posterior subcapsular cataract; ERM epi-retinal membrane; PVD posterior vitreous detachment; RD retinal detachment; DM diabetes mellitus; DR diabetic retinopathy; NPDR non-proliferative diabetic retinopathy; PDR proliferative diabetic retinopathy; CSME clinically significant macular edema; DME diabetic macular edema; dbh dot blot hemorrhages; CWS cotton wool spot; POAG primary open angle glaucoma; C/D cup-to-disc ratio; HVF humphrey visual field; GVF goldmann visual field; OCT optical coherence tomography; IOP intraocular pressure; BRVO Branch retinal vein occlusion; CRVO central retinal vein occlusion; CRAO central retinal artery occlusion; BRAO branch retinal artery occlusion; RT retinal tear; SB scleral buckle; PPV pars plana vitrectomy; VH Vitreous hemorrhage; PRP panretinal laser photocoagulation; IVK intravitreal kenalog; VMT vitreomacular traction; MH Macular hole;  NVD neovascularization of the disc;  NVE neovascularization elsewhere; AREDS age related eye disease study; ARMD age related macular degeneration; POAG primary open angle glaucoma; EBMD epithelial/anterior basement membrane dystrophy; ACIOL anterior chamber intraocular lens; IOL intraocular lens; PCIOL posterior chamber intraocular lens; Phaco/IOL phacoemulsification with intraocular lens placement; PRK photorefractive keratectomy; LASIK laser assisted in situ keratomileusis; HTN hypertension; DM diabetes mellitus; COPD chronic obstructive pulmonary disease

## 2019-09-16 NOTE — Assessment & Plan Note (Signed)
OD, BRVO CME resolved and improved 5-week follow-up.

## 2019-09-17 DIAGNOSIS — R55 Syncope and collapse: Secondary | ICD-10-CM | POA: Diagnosis not present

## 2019-09-17 DIAGNOSIS — S0532XA Ocular laceration without prolapse or loss of intraocular tissue, left eye, initial encounter: Secondary | ICD-10-CM | POA: Diagnosis not present

## 2019-09-17 DIAGNOSIS — R5381 Other malaise: Secondary | ICD-10-CM | POA: Diagnosis not present

## 2019-09-17 DIAGNOSIS — N390 Urinary tract infection, site not specified: Secondary | ICD-10-CM | POA: Diagnosis not present

## 2019-09-18 DIAGNOSIS — Z9181 History of falling: Secondary | ICD-10-CM | POA: Diagnosis not present

## 2019-09-18 DIAGNOSIS — M6282 Rhabdomyolysis: Secondary | ICD-10-CM | POA: Diagnosis not present

## 2019-09-18 DIAGNOSIS — R55 Syncope and collapse: Secondary | ICD-10-CM | POA: Diagnosis not present

## 2019-09-25 ENCOUNTER — Ambulatory Visit (INDEPENDENT_AMBULATORY_CARE_PROVIDER_SITE_OTHER): Payer: Medicare Other | Admitting: Internal Medicine

## 2019-09-25 ENCOUNTER — Other Ambulatory Visit: Payer: Self-pay

## 2019-09-25 ENCOUNTER — Encounter: Payer: Self-pay | Admitting: Internal Medicine

## 2019-09-25 VITALS — BP 170/82 | HR 74 | Temp 97.3°F | Resp 16 | Ht 67.0 in | Wt 144.4 lb

## 2019-09-25 DIAGNOSIS — Z66 Do not resuscitate: Secondary | ICD-10-CM | POA: Diagnosis not present

## 2019-09-25 DIAGNOSIS — G4700 Insomnia, unspecified: Secondary | ICD-10-CM

## 2019-09-25 DIAGNOSIS — M5030 Other cervical disc degeneration, unspecified cervical region: Secondary | ICD-10-CM | POA: Diagnosis not present

## 2019-09-25 DIAGNOSIS — Z22322 Carrier or suspected carrier of Methicillin resistant Staphylococcus aureus: Secondary | ICD-10-CM

## 2019-09-25 DIAGNOSIS — L900 Lichen sclerosus et atrophicus: Secondary | ICD-10-CM

## 2019-09-25 DIAGNOSIS — I951 Orthostatic hypotension: Secondary | ICD-10-CM

## 2019-09-25 DIAGNOSIS — N958 Other specified menopausal and perimenopausal disorders: Secondary | ICD-10-CM | POA: Diagnosis not present

## 2019-09-25 MED ORDER — ACETAMINOPHEN 500 MG PO CAPS: 2.0000 | ORAL_CAPSULE | Freq: Every day | ORAL | 11 refills | Status: DC

## 2019-09-25 MED ORDER — CLOBETASOL PROPIONATE 0.05 % EX CREA: 1.0000 "application " | TOPICAL_CREAM | Freq: Two times a day (BID) | CUTANEOUS | 5 refills | Status: DC

## 2019-09-25 MED ORDER — TRAMADOL HCL 50 MG PO TABS: 50.0000 mg | ORAL_TABLET | Freq: Two times a day (BID) | ORAL | 0 refills | Status: DC | PRN

## 2019-09-25 MED ORDER — TRAZODONE HCL 50 MG PO TABS
25.0000 mg | ORAL_TABLET | Freq: Every day | ORAL | 11 refills | Status: DC
Start: 2019-09-25 — End: 2020-08-12

## 2019-09-25 NOTE — Progress Notes (Signed)
Location:  [facility] Provider:  Aemilia Dedrick L. Renato Gails, D.O., C.M.D.  Code Status: DNR Goals of Care:  Advanced Directives 07/16/2019  Does Patient Have a Medical Advance Directive? Yes  Type of Advance Directive Healthcare Power of Attorney  Does patient want to make changes to medical advance directive? No - Patient declined  Copy of Healthcare Power of Attorney in Chart? Yes - validated most recent copy scanned in chart (See row information)  Would patient like information on creating a medical advance directive? -  Pre-existing out of facility DNR order (yellow form or pink MOST form) -   Chief Complaint  Patient presents with  . Medical Management of Chronic Issues    3 Month Follow Up.    HPI: Patient is a 86 y.o. female seen today for medical management of chronic diseases.    She had her eyeball rupture and she had emergency surgery.  She went to rehab at [facility].  she then went to assisted living at [place].  She has MRSA in her urine, but her s/s outside of frequency are better.  She gets up multiple times per night. She's not sleeping.  Slept 10-12 last night.   Myrbetriq ran her bp up so she quit taking it.  She continues with vaginal pain. Reminded her and her daughter about the genitourinary symptoms of menopause diagnosis--she is continuing her estrogen cream as ordered by gyn.  They want to try D-mannose for her which I don't think would be harmful and can be obtained otc and given.    Her neck pain has been worse since her fall and she's needing her tramadol ordered at [facility] for this.  She has not been getting her clobetasol cream since her move to [place] and she needs this for her lichen sclerosis at this point.    She does not want to use flonase. Past Medical History:  Diagnosis Date  . Cervicalgia   . Chronic kidney disease   . Chronic kidney disease, stage III (moderate)   . Dermatophytosis of groin and perianal area   . Dyspnea   . Dysrhythmia   .  Hyperlipidemia LDL goal < 100   . Insomnia, unspecified   . Muscle weakness (generalized)   . Orthostatic hypotension   . Osteoarthrosis, unspecified whether generalized or localized, lower leg    osteoarthritis - upper thoracic area, left knee.  . Other abnormal glucose   . Other B-complex deficiencies   . Other malaise and fatigue   . Palpitations   . Senile cataract, unspecified   . Shortness of breath   . Stroke [facility])    small "stroke" - right sided weakness for a day.  . Stroke (HCC)   . Tachycardia, unspecified   . Undiagnosed cardiac murmurs   . Unspecified essential hypertension   . Unspecified vitamin D deficiency   . Urinary tract infection     Past Surgical History:  Procedure Laterality Date  . CATARACT EXTRACTION W/PHACO Bilateral 1994   Dr. Dione Booze  . CATARACT EXTRACTION, BILATERAL Bilateral   . EYE SURGERY     cataract  . RUPTURED GLOBE EXPLORATION AND REPAIR Left 09/03/2019   Procedure: Scleral laceration repair LEFT EYE, exam under anesthesia LEFT EYE;  Surgeon: Olivia Canter, MD;  Location: [facility] OR;  Service: Ophthalmology;  Laterality: Left;  . TOE AMPUTATION  2002  . TONSILLECTOMY  1944  . TONSILLECTOMY     younger yrs  . TOTAL KNEE ARTHROPLASTY Left 01/15/2015   Procedure:  LEFT TOTAL KNEE ARTHROPLASTY;  Surgeon: Eugenia Mcalpine, MD;  Location: [facility] ORS;  Service: Orthopedics;  Laterality: Left;  . TUBAL LIGATION  1951    Allergies  Allergen Reactions  . Ace Inhibitors Cough  . Lisinopril Cough  . Statins Other (See Comments)    Body aches and pains    Outpatient Encounter Medications as of 09/25/2019  Medication Sig  . amoxicillin (AMOXIL) 500 MG capsule Take 2,000 mg by mouth See admin instructions. Take 2,000 mg by mouth one hour prior to dental procedures  . b complex vitamins tablet Take 1 tablet by mouth daily.  . carvedilol (COREG) 6.25 MG tablet Take 6.25 mg by mouth 2 (two) times daily.  . Cholecalciferol (VITAMIN D3) 10 MCG (400 UNIT)  CAPS Take 400 Units by mouth daily.  . clopidogrel (PLAVIX) 75 MG tablet Take 75 mg by mouth daily.  Marland Kitchen conjugated estrogens (PREMARIN) vaginal cream Place 1 Applicatorful vaginally daily.  . Cyanocobalamin (B-12 PO) Take 1 tablet by mouth daily.  . diphenhydramine-acetaminophen (TYLENOL PM) 25-500 MG TABS tablet Take 2 tablets by mouth at bedtime as needed (for sleep).  . dorzolamide-timolol (COSOPT) 22.3-6.8 MG/ML ophthalmic solution Place 1 drop into the left eye 2 (two) times daily.  . Multiple Vitamins-Minerals (PRESERVISION AREDS 2+MULTI VIT PO) Take 2 capsules by mouth 2 (two) times daily.   . Multiple Vitamins-Minerals (PRESERVISION AREDS 2+MULTI VIT) CAPS Take 1 capsule by mouth 2 (two) times daily.   Marland Kitchen ofloxacin (OCUFLOX) 0.3 % ophthalmic solution Place 1 drop into the left eye 4 (four) times daily.  . prednisoLONE acetate (PRED FORTE) 1 % ophthalmic suspension Place 1 drop into the left eye 4 (four) times daily.  . traMADol (ULTRAM) 50 MG tablet Take 1 tablet (50 mg total) by mouth every 12 (twelve) hours as needed.  . traMADol (ULTRAM) 50 MG tablet Take 1 tablet (50 mg total) by mouth 2 (two) times daily as needed for moderate pain (for pain).  Marland Kitchen VITAMIN D PO Take 1 tablet by mouth daily.  . [DISCONTINUED] carvedilol (COREG) 6.25 MG tablet Take one tablet by mouth two times daily with a meal  . [DISCONTINUED] clopidogrel (PLAVIX) 75 MG tablet TAKE 1 TABLET BY MOUTH  DAILY WITH BREAKFAST  . [DISCONTINUED] diphenhydramine-acetaminophen (TYLENOL PM) 25-500 MG TABS tablet Take 2 tablets by mouth at bedtime.  . [DISCONTINUED] fluticasone (FLONASE) 50 MCG/ACT nasal spray Place 2 sprays into both nostrils daily as needed for allergies or rhinitis.  . [DISCONTINUED] MYRBETRIQ 25 MG TB24 tablet Take 25 mg by mouth at bedtime. (Patient not taking: Reported on 09/03/2019)   No facility-administered encounter medications on file as of 09/25/2019.    Review of Systems:  Review of Systems    Constitutional: Negative for chills and fever.  HENT: Positive for hearing loss. Negative for congestion and sore throat.   Eyes: Positive for blurred vision.  Respiratory: Negative for cough and shortness of breath.   Cardiovascular: Negative for chest pain, palpitations and leg swelling.  Gastrointestinal: Negative for abdominal pain, blood in stool, constipation and melena.  Genitourinary: Positive for dysuria and frequency. Negative for flank pain, hematuria and urgency.  Musculoskeletal: Positive for falls and neck pain.  Skin: Negative for itching and rash.  Neurological: Negative for dizziness and loss of consciousness.  Endo/Heme/Allergies: Bruises/bleeds easily.  Psychiatric/Behavioral: Negative for depression and memory loss. The patient has insomnia. The patient is not nervous/anxious.     Health Maintenance  Topic Date Due  . INFLUENZA VACCINE  10/19/2019  .  TETANUS/TDAP  05/21/2021  . COVID-19 Vaccine  Completed  . PNA vac Low Risk Adult  Completed  . DEXA SCAN  Addressed    Physical Exam: Vitals:   09/25/19 1255  BP: 140/78  Pulse: 87  Resp: 16  Temp: (!) 97.3 F (36.3 C)  SpO2: 98%  Weight: 144 lb 6.4 oz (65.5 kg)  Height:  (1.702 m)   Body mass index is 22.62 kg/m. Physical Exam Vitals reviewed.  Constitutional:      General: She is not in acute distress.    Appearance: Normal appearance. She is not ill-appearing or toxic-appearing.     Comments: Comes in lightweight wheelchair today, but is ambulatory with walker short distances at [place]  HENT:     Head: Normocephalic and atraumatic.     Ears:     Comments: HOH Eyes:     General: No scleral icterus.    Conjunctiva/sclera: Conjunctivae normal.     Comments: Left eye looks good--healing well and vision in it better than anticipated   Cardiovascular:     Rate and Rhythm: Normal rate and regular rhythm.  Pulmonary:     Effort: Pulmonary effort is normal.     Breath sounds: Normal breath sounds.  No wheezing, rhonchi or rales.  Abdominal:     General: Bowel sounds are normal. There is no distension.     Tenderness: There is no abdominal tenderness.  Musculoskeletal:        General: Normal range of motion.     Cervical back: Neck supple.     Right lower leg: Edema present.     Left lower leg: Edema present.     Comments: Mild swelling from feet dangling in wheelchair  Skin:    General: Skin is warm and dry.  Neurological:     General: No focal deficit present.     Mental Status: She is alert and oriented to person, place, and time.  Psychiatric:        Mood and Affect: Mood normal.        Behavior: Behavior normal.        Thought Content: Thought content normal.        Judgment: Judgment normal.     Labs reviewed: Basic Metabolic Panel: Recent Labs    06/05/19 0901 07/15/19 1335 09/03/19 1400 09/04/19 0156 09/05/19 0242  NA 142   < > 134* 135 134*  K 4.2   < > 4.1 4.5 3.9  CL 107   < > 101 102 103  CO2 27   < > GLUCOSE 109*   < > 197* 160* 118*  BUN 27*   < > 25* 19 23  CREATININE 1.14*   < > 1.29* 0.90 1.14*  CALCIUM 9.2   < > 8.5* 7.9* 8.1*  MG  --   --   --   --  1.8  TSH 2.44  --   --   --   --    < > = values in this interval not displayed.   Liver Function Tests: Recent Labs    01/04/19 1355 01/04/19 1355 06/05/19 0901 09/03/19 1400 09/05/19 0242  AST 21   < > 12 45* 17  ALT 32   < > 10 202* 94*  ALKPHOS 100  --   --  162* 106  BILITOT 0.9   < > 0.6 1.4* 0.9  PROT 7.1   < > 6.4 5.9* 4.9*  ALBUMIN 4.1  --   --  3.4* 2.7*   < > = values in this interval not displayed.   No results for input(s): LIPASE, AMYLASE in the last 8760 hours. No results for input(s): AMMONIA in the last 8760 hours. CBC: Recent Labs    01/04/19 1355 01/04/19 1355 06/05/19 0901 07/15/19 1335 09/03/19 1400 09/04/19 0156 09/05/19 0242  WBC 11.8*   < > 6.3   < > 5.9 5.0 5.3  NEUTROABS 9.5*  --  4,278  --  4.5  --   --   HGB 13.3   < > 13.2   < > 11.0*  10.6* 10.2*  HCT 41.7   < > 40.2   < > 34.9* 33.2* 32.0*  MCV 95.4   < > 91.6   < > 97.2 94.6 95.2  PLT 276   < > 277   < > 217 202 209   < > = values in this interval not displayed.   Lipid Panel: No results for input(s): CHOL, HDL, LDLCALC, TRIG, CHOLHDL, LDLDIRECT in the last 8760 hours. Lab Results  Component Value Date   HGBA1C 5.7 (H) 12/26/2018    Procedures since last visit: CT HEAD WO CONTRAST  Result Date: 09/03/2019 CLINICAL DATA:  Syncope, left-sided facial swelling EXAM: CT HEAD WITHOUT CONTRAST TECHNIQUE: Contiguous axial images were obtained from the base of the skull through the vertex without intravenous contrast. COMPARISON:  None. FINDINGS: Brain: Focal hypodensities are seen within the right basal ganglia and left corona radiata consistent with chronic lacunar infarcts. Hypodensities in the bilateral periventricular white matter consistent with age-indeterminate small vessel ischemic change. No other signs of acute infarct or hemorrhage. The lateral ventricles and midline structures are unremarkable. No acute extra-axial fluid collections. No mass effect. Vascular: No hyperdense vessel or unexpected calcification. Skull: Normal. Negative for fracture or focal lesion. Sinuses/Orbits: Mucosal thickening is seen within the right maxillary sinus. Remaining sinuses are clear. Other: None. IMPRESSION: 1. Likely chronic small-vessel ischemic changes throughout the white matter and basal ganglia as above. 2. No acute intracranial trauma. Electronically Signed   By: Sharlet Salina M.D.   On: 09/03/2019 16:05   DG Chest Port 1 View  Result Date: 09/03/2019 CLINICAL DATA:  Syncope and fall, denies chest pain or shortness of breath EXAM: PORTABLE CHEST 1 VIEW COMPARISON:  None. FINDINGS: Mild hyperinflation with relative apical lucency and coarsened interstitial changes towards the bases. No consolidation, features of edema, pneumothorax, or effusion. The aorta is calcified. The remaining  cardiomediastinal contours are unremarkable. No acute osseous or soft tissue abnormality. Degenerative changes are present in the imaged spine and shoulders. IMPRESSION: No acute cardiopulmonary or traumatic findings within the chest. Hyperinflation and coarsened interstitium. Can be seen as chronic features. Aortic Atherosclerosis (ICD10-I70.0). Electronically Signed   By: Kreg Shropshire M.D.   On: 09/03/2019 15:04   ECHOCARDIOGRAM COMPLETE  Result Date: 09/04/2019    ECHOCARDIOGRAM REPORT   Patient Name:   JEREMIAH TARPLEY Date of Exam: 09/04/2019 Medical Rec #:  [id number]          Height:       67.0 in Accession #:    [id number]         Weight:       130.0 lb Date of Birth:  [birth year]           BSA:          1.684 m Patient Age:    89 years           BP:  137/69 mmHg Patient Gender: F                  HR:           79 bpm. Exam Location:  Inpatient Procedure: 2D Echo, Color Doppler and Cardiac Doppler Indications:    R55 Syncope  History:        Patient has prior history of Echocardiogram examinations, most                 recent 07/18/2017. Risk Factors:Hypertension and Dyslipidemia.  Sonographer:    Irving Burton Senior RDCS Referring Phys: [id number] Andris Baumann  Sonographer Comments: Technically difficult due to small rib spacing. IMPRESSIONS  1. Left ventricular ejection fraction, by estimation, is 60 to 65%. The left ventricle has normal function. The left ventricle has no regional wall motion abnormalities. There is mild left ventricular hypertrophy of the basal-septal segment. Left ventricular diastolic parameters are consistent with Grade I diastolic dysfunction (impaired relaxation).  2. Right ventricular systolic function is normal. The right ventricular size is normal. There is normal pulmonary artery systolic pressure.  3. Left atrial size was mild to moderately dilated.  4. The mitral valve is normal in structure. No evidence of mitral valve regurgitation.  5. The aortic valve is normal in  structure. Aortic valve regurgitation is not visualized. Mild aortic valve sclerosis is present, with no evidence of aortic valve stenosis.  6. The inferior vena cava is normal in size with greater than 50% respiratory variability, suggesting right atrial pressure of 3 mmHg. FINDINGS  Left Ventricle: Left ventricular ejection fraction, by estimation, is 60 to 65%. The left ventricle has normal function. The left ventricle has no regional wall motion abnormalities. The left ventricular internal cavity size was normal in size. There is  mild left ventricular hypertrophy of the basal-septal segment. Left ventricular diastolic parameters are consistent with Grade I diastolic dysfunction (impaired relaxation). Right Ventricle: The right ventricular size is normal. No increase in right ventricular wall thickness. Right ventricular systolic function is normal. There is normal pulmonary artery systolic pressure. The tricuspid regurgitant velocity is 2.29 m/s, and  with an assumed right atrial pressure of 3 mmHg, the estimated right ventricular systolic pressure is 24.0 mmHg. Left Atrium: Left atrial size was mild to moderately dilated. Right Atrium: Right atrial size was normal in size. Pericardium: There is no evidence of pericardial effusion. Mitral Valve: The mitral valve is normal in structure. No evidence of mitral valve regurgitation. Tricuspid Valve: The tricuspid valve is normal in structure. Tricuspid valve regurgitation is trivial. Aortic Valve: The aortic valve is normal in structure. Aortic valve regurgitation is not visualized. Mild aortic valve sclerosis is present, with no evidence of aortic valve stenosis. There is mild calcification of the aortic valve. Pulmonic Valve: The pulmonic valve was grossly normal. Pulmonic valve regurgitation is not visualized. Aorta: The aortic root and ascending aorta are structurally normal, with no evidence of dilitation. Venous: The inferior vena cava is normal in size with  greater than 50% respiratory variability, suggesting right atrial pressure of 3 mmHg. IAS/Shunts: No atrial level shunt detected by color flow Doppler.  LEFT VENTRICLE PLAX 2D LVIDd:         3.20 cm  Diastology LVIDs:         2.10 cm  LV e' lateral:   3.81 cm/s LV PW:         1.00 cm  LV E/e' lateral: 15.2 LV IVS:  1.30 cm  LV e' medial:    3.37 cm/s LVOT diam:     2.00 cm  LV E/e' medial:  17.2 LV SV:         68 LV SV Index:   40 LVOT Area:     3.14 cm  RIGHT VENTRICLE RV S prime:     11.60 cm/s TAPSE (M-mode): 2.5 cm LEFT ATRIUM             Index       RIGHT ATRIUM           Index LA diam:        2.10 cm 1.25 cm/m  RA Area:     11.50 cm LA Vol (A2C):   53.6 ml 31.83 ml/m RA Volume:   23.70 ml  14.07 ml/m LA Vol (A4C):   37.2 ml 22.09 ml/m LA Biplane Vol: 45.9 ml 27.26 ml/m  AORTIC VALVE LVOT Vmax:   96.00 cm/s LVOT Vmean:  68.500 cm/s LVOT VTI:    0.216 m  AORTA Ao Root diam: 3.30 cm Ao Asc diam:  3.20 cm MITRAL VALVE                TRICUSPID VALVE MV Area (PHT): 2.85 cm     TR Peak grad:   21.0 mmHg MV Decel Time: 266 msec     TR Vmax:        229.00 cm/s MV E velocity: 58.10 cm/s MV A velocity: 123.00 cm/s  SHUNTS MV E/A ratio:  0.47         Systemic VTI:  0.22 m                             Systemic Diam: 2.00 cm Arvilla Meresaniel Bensimhon MD Electronically signed by Arvilla Meresaniel Bensimhon MD Signature Date/Time: 09/04/2019/2:24:50 PM    Final    Intravitreal Injection, Pharmacologic Agent - OD - Right Eye  Result Date: 09/16/2019 Time Out 09/16/2019. 10:55 AM. Confirmed correct patient, procedure, site, and patient consented. Anesthesia Topical anesthesia was used. Anesthetic medications included Akten 3.5%. Procedure Preparation included Ofloxacin , 10% betadine to eyelids, 5% betadine to ocular surface. A 30 gauge needle was used. Injection: 1.25 mg Bevacizumab (AVASTIN) SOLN   NDC: [address]-469194-0334-1, Lot: [id number]   Route: Intravitreal, Site: Right Eye, Waste: 0 mg Post-op Post injection exam found visual acuity of  at least counting fingers. The patient tolerated the procedure well. There were no complications. The patient received written and verbal post procedure care education. Post injection medications were not given.   OCT, Retina - OU - Both Eyes  Result Date: 09/16/2019 Right Eye Quality was good. Scan locations included subfoveal. Central Foveal Thickness: 279. Progression has been stable. Left Eye Quality was good. Scan locations included subfoveal. Notes -Less CME OD, status post intravitreal Avastin for BRVO, stable at 5 weeks will extend his interval to 6 weeks OD  VAS US CAROTID  Result Date: 09/04/2019 Carotid Arterial Duplex Study Indications:  Syncope. Risk Factors: None. Performing Technologist: Jeb LeveringJill Parker RDMS, RVT  Examination Guidelines: A complete evaluation includes B-mode imaging, spectral Doppler, color Doppler, and power Doppler as needed of all accessible portions of each vessel. Bilateral testing is considered an integral part of a complete examination. Limited examinations for reoccurring indications may be performed as noted.  Right Carotid Findings: +----------+--------+--------+--------+------------------+--------+           PSV cm/sEDV cm/sStenosisPlaque DescriptionComments +----------+--------+--------+--------+------------------+--------+ CCA Prox  90  18              hypoechoic                 +----------+--------+--------+--------+------------------+--------+ CCA Distal85      12                                         +----------+--------+--------+--------+------------------+--------+ ICA Prox  94      21      1-39%   heterogenous               +----------+--------+--------+--------+------------------+--------+ ICA Distal75      17                                         +----------+--------+--------+--------+------------------+--------+ ECA       310     14      >50%    heterogenous                +----------+--------+--------+--------+------------------+--------+ +----------+--------+-------+----------------+-------------------+           PSV cm/sEDV cmsDescribe        Arm Pressure (mmHG) +----------+--------+-------+----------------+-------------------+ [id number]            Multiphasic, WNL                    +----------+--------+-------+----------------+-------------------+ +---------+--------+--+--------+-+---------+ VertebralPSV cm/s51EDV cm/s7Antegrade +---------+--------+--+--------+-+---------+  Left Carotid Findings: +----------+--------+--------+--------+------------------+--------+           PSV cm/sEDV cm/sStenosisPlaque DescriptionComments +----------+--------+--------+--------+------------------+--------+ CCA Prox  109     28              hypoechoic                 +----------+--------+--------+--------+------------------+--------+ CCA Distal94      27                                         +----------+--------+--------+--------+------------------+--------+ ICA Prox  139     26      1-39%   heterogenous               +----------+--------+--------+--------+------------------+--------+ ICA Distal94      31                                         +----------+--------+--------+--------+------------------+--------+ ECA       355     5       >50%    heterogenous               +----------+--------+--------+--------+------------------+--------+ +----------+--------+--------+--------+-------------------+           PSV cm/sEDV cm/sDescribeArm Pressure (mmHG) +----------+--------+--------+--------+-------------------+ [id number]                                         +----------+--------+--------+--------+-------------------+ +---------+--------+--+--------+--+ VertebralPSV cm/s59EDV cm/s11 +---------+--------+--+--------+--+   Summary: Right Carotid: Velocities in the right ICA are consistent with a 1-39% stenosis.                 The ECA appears >50% stenosed. Left Carotid:  Velocities in the left ICA are consistent with a 1-39% stenosis.               The ECA appears >50% stenosed.  *See table(s) above for measurements and observations.  Electronically signed by Sherald Hess MD on 09/04/2019 at 3:14:18 PM.    Final    CT Orbits Wo Contrast  Result Date: 09/03/2019 CLINICAL DATA:  Syncope, ophthalmoplegia, left-sided facial swelling EXAM: CT ORBITS WITHOUT CONTRAST TECHNIQUE: Multidetector CT images were obtained using the standard protocol without intravenous contrast. COMPARISON:  None. FINDINGS: Orbits: No traumatic or inflammatory finding. Globes, optic nerves, orbital fat, extraocular muscles, vascular structures, and lacrimal glands are normal. Visualized sinuses: Mucosal thickening right maxillary sinus. Remaining sinuses are clear. Soft tissues: Negative. Limited intracranial: No significant or unexpected finding. IMPRESSION: 1. No acute displaced fracture.  Unremarkable orbits and globes. Electronically Signed   By: Sharlet Salina M.D.   On: 09/03/2019 16:08   US Abdomen Limited RUQ  Result Date: 09/03/2019 CLINICAL DATA:  Abnormal liver enzymes EXAM: ULTRASOUND ABDOMEN LIMITED RIGHT UPPER QUADRANT COMPARISON:  None. FINDINGS: Gallbladder: Layering gallbladder sludge is seen. No sonographic Murphy sign noted by sonographer. Common bile duct: Diameter: 5 mm Liver: Increased echotexture seen throughout. No focal abnormality or biliary ductal dilatation. Portal vein is patent on color Doppler imaging with normal direction of blood flow towards the liver. Other: None. IMPRESSION: Layering gallbladder sludge.  No evidence of acute cholecystitis Hepatic steatosis Electronically Signed   By: Jonna Clark M.D.   On: 09/03/2019 23:38    Assessment/Plan 1. DDD (degenerative disc disease), cervical -Bothering her more again since her fall Orders for tramadol provided to the assisted living facility - traMADol  (ULTRAM) 50 MG tablet; Take 1 tablet (50 mg total) by mouth 2 (two) times daily as needed for moderate pain (for pain).  Dispense: 60 tablet; Refill: 0  2. Insomnia, unspecified type -Begin trazodone 50 mg 1/2 tablet p.o. nightly as needed insomnia  3. Lichen sclerosus -Has not had her clobetasol cream at ALF-orders written - clobetasol cream (TEMOVATE) 0.05 %; Apply 1 application topically 2 (two) times daily.  Dispense: 60 g; Refill: 5  4. DNR (do not resuscitate) - Do not attempt resuscitation (DNR)--order reentered into epic after emergency room visit.  5. Genitourinary syndrome of menopause -Ongoing issue and present similar to UTIs. -Avoid frequent urine testing -Resident was recently colonized with MRSA but without new symptoms -Continue Estrace cream, push fluids, begin d-mannose -Resident may keep creams at bedside and self apply  6. MRSA (methicillin resistant Staphylococcus aureus) colonization -Noted in urine  7. Orthostatic hypotension -Counseled as always about her hydration and also standing up slowly changing positions slowly May consider compression hose since she is now in a facility where staff could apply these for her  Labs/tests ordered:  No new Next appt:  4 mos med mgt  56 mins spent with patient and her daughter, reviewing hospital records, [facility] labs, [facility] list, filling in visit form for AL, printing out Rxs for facility  Jahniyah Revere L. Teron Blais, D.O. Geriatrics [facility] [address], [place] [address] Cell Phone (Mon-Fri 8am-5pm):  [phone] On Call:  [phone] & follow prompts after 5pm & weekends Office Phone:  [phone] Office Fax:  [phone]

## 2019-09-26 ENCOUNTER — Other Ambulatory Visit: Payer: Self-pay

## 2019-09-26 DIAGNOSIS — H9193 Unspecified hearing loss, bilateral: Secondary | ICD-10-CM

## 2019-09-29 ENCOUNTER — Telehealth: Payer: Self-pay

## 2019-09-29 NOTE — Telephone Encounter (Signed)
Incoming call received from Angie with [facility] requesting copy of last OV note to be faxed to [phone]   Last OV note faxed as requested

## 2019-10-01 DIAGNOSIS — I1310 Hypertensive heart and chronic kidney disease without heart failure, with stage 1 through stage 4 chronic kidney disease, or unspecified chronic kidney disease: Secondary | ICD-10-CM

## 2019-10-01 DIAGNOSIS — Z96652 Presence of left artificial knee joint: Secondary | ICD-10-CM | POA: Diagnosis not present

## 2019-10-01 DIAGNOSIS — E538 Deficiency of other specified B group vitamins: Secondary | ICD-10-CM | POA: Diagnosis not present

## 2019-10-01 DIAGNOSIS — E782 Mixed hyperlipidemia: Secondary | ICD-10-CM | POA: Diagnosis not present

## 2019-10-01 DIAGNOSIS — Z8673 Personal history of transient ischemic attack (TIA), and cerebral infarction without residual deficits: Secondary | ICD-10-CM | POA: Diagnosis not present

## 2019-10-01 DIAGNOSIS — H353222 Exudative age-related macular degeneration, left eye, with inactive choroidal neovascularization: Secondary | ICD-10-CM | POA: Diagnosis not present

## 2019-10-01 DIAGNOSIS — S0522XD Ocular laceration and rupture with prolapse or loss of intraocular tissue, left eye, subsequent encounter: Secondary | ICD-10-CM | POA: Diagnosis not present

## 2019-10-01 DIAGNOSIS — M5030 Other cervical disc degeneration, unspecified cervical region: Secondary | ICD-10-CM | POA: Diagnosis not present

## 2019-10-01 DIAGNOSIS — B9562 Methicillin resistant Staphylococcus aureus infection as the cause of diseases classified elsewhere: Secondary | ICD-10-CM | POA: Diagnosis not present

## 2019-10-01 DIAGNOSIS — D631 Anemia in chronic kidney disease: Secondary | ICD-10-CM

## 2019-10-01 DIAGNOSIS — N390 Urinary tract infection, site not specified: Secondary | ICD-10-CM | POA: Diagnosis not present

## 2019-10-01 DIAGNOSIS — Z7902 Long term (current) use of antithrombotics/antiplatelets: Secondary | ICD-10-CM | POA: Diagnosis not present

## 2019-10-01 DIAGNOSIS — N183 Chronic kidney disease, stage 3 unspecified: Secondary | ICD-10-CM

## 2019-10-01 DIAGNOSIS — J984 Other disorders of lung: Secondary | ICD-10-CM | POA: Diagnosis not present

## 2019-10-01 DIAGNOSIS — Z9849 Cataract extraction status, unspecified eye: Secondary | ICD-10-CM | POA: Diagnosis not present

## 2019-10-01 DIAGNOSIS — I700 Atherosclerosis of aorta: Secondary | ICD-10-CM | POA: Diagnosis not present

## 2019-10-01 DIAGNOSIS — Z9181 History of falling: Secondary | ICD-10-CM | POA: Diagnosis not present

## 2019-10-01 DIAGNOSIS — K760 Fatty (change of) liver, not elsewhere classified: Secondary | ICD-10-CM | POA: Diagnosis not present

## 2019-10-01 DIAGNOSIS — G4700 Insomnia, unspecified: Secondary | ICD-10-CM | POA: Diagnosis not present

## 2019-10-01 DIAGNOSIS — H348311 Tributary (branch) retinal vein occlusion, right eye, with retinal neovascularization: Secondary | ICD-10-CM | POA: Diagnosis not present

## 2019-10-01 DIAGNOSIS — M6281 Muscle weakness (generalized): Secondary | ICD-10-CM | POA: Diagnosis not present

## 2019-10-01 DIAGNOSIS — M47815 Spondylosis without myelopathy or radiculopathy, thoracolumbar region: Secondary | ICD-10-CM | POA: Diagnosis not present

## 2019-10-01 DIAGNOSIS — E559 Vitamin D deficiency, unspecified: Secondary | ICD-10-CM | POA: Diagnosis not present

## 2019-10-01 DIAGNOSIS — W19XXXD Unspecified fall, subsequent encounter: Secondary | ICD-10-CM | POA: Diagnosis not present

## 2019-10-01 DIAGNOSIS — I951 Orthostatic hypotension: Secondary | ICD-10-CM

## 2019-10-02 ENCOUNTER — Telehealth: Payer: Self-pay | Admitting: *Deleted

## 2019-10-02 DIAGNOSIS — Z9849 Cataract extraction status, unspecified eye: Secondary | ICD-10-CM | POA: Diagnosis not present

## 2019-10-02 DIAGNOSIS — Z96652 Presence of left artificial knee joint: Secondary | ICD-10-CM | POA: Diagnosis not present

## 2019-10-02 DIAGNOSIS — H348311 Tributary (branch) retinal vein occlusion, right eye, with retinal neovascularization: Secondary | ICD-10-CM | POA: Diagnosis not present

## 2019-10-02 DIAGNOSIS — N183 Chronic kidney disease, stage 3 unspecified: Secondary | ICD-10-CM | POA: Diagnosis not present

## 2019-10-02 DIAGNOSIS — E782 Mixed hyperlipidemia: Secondary | ICD-10-CM | POA: Diagnosis not present

## 2019-10-02 DIAGNOSIS — M6281 Muscle weakness (generalized): Secondary | ICD-10-CM | POA: Diagnosis not present

## 2019-10-02 DIAGNOSIS — E559 Vitamin D deficiency, unspecified: Secondary | ICD-10-CM | POA: Diagnosis not present

## 2019-10-02 DIAGNOSIS — D631 Anemia in chronic kidney disease: Secondary | ICD-10-CM | POA: Diagnosis not present

## 2019-10-02 DIAGNOSIS — I951 Orthostatic hypotension: Secondary | ICD-10-CM | POA: Diagnosis not present

## 2019-10-02 DIAGNOSIS — I700 Atherosclerosis of aorta: Secondary | ICD-10-CM | POA: Diagnosis not present

## 2019-10-02 DIAGNOSIS — N390 Urinary tract infection, site not specified: Secondary | ICD-10-CM | POA: Diagnosis not present

## 2019-10-02 DIAGNOSIS — J984 Other disorders of lung: Secondary | ICD-10-CM | POA: Diagnosis not present

## 2019-10-02 DIAGNOSIS — Z7902 Long term (current) use of antithrombotics/antiplatelets: Secondary | ICD-10-CM | POA: Diagnosis not present

## 2019-10-02 DIAGNOSIS — M5030 Other cervical disc degeneration, unspecified cervical region: Secondary | ICD-10-CM | POA: Diagnosis not present

## 2019-10-02 DIAGNOSIS — G4700 Insomnia, unspecified: Secondary | ICD-10-CM | POA: Diagnosis not present

## 2019-10-02 DIAGNOSIS — Z9181 History of falling: Secondary | ICD-10-CM | POA: Diagnosis not present

## 2019-10-02 DIAGNOSIS — Z8673 Personal history of transient ischemic attack (TIA), and cerebral infarction without residual deficits: Secondary | ICD-10-CM | POA: Diagnosis not present

## 2019-10-02 DIAGNOSIS — H353222 Exudative age-related macular degeneration, left eye, with inactive choroidal neovascularization: Secondary | ICD-10-CM | POA: Diagnosis not present

## 2019-10-02 DIAGNOSIS — I1310 Hypertensive heart and chronic kidney disease without heart failure, with stage 1 through stage 4 chronic kidney disease, or unspecified chronic kidney disease: Secondary | ICD-10-CM | POA: Diagnosis not present

## 2019-10-02 DIAGNOSIS — E538 Deficiency of other specified B group vitamins: Secondary | ICD-10-CM | POA: Diagnosis not present

## 2019-10-02 DIAGNOSIS — B9562 Methicillin resistant Staphylococcus aureus infection as the cause of diseases classified elsewhere: Secondary | ICD-10-CM | POA: Diagnosis not present

## 2019-10-02 DIAGNOSIS — M47815 Spondylosis without myelopathy or radiculopathy, thoracolumbar region: Secondary | ICD-10-CM | POA: Diagnosis not present

## 2019-10-02 DIAGNOSIS — K760 Fatty (change of) liver, not elsewhere classified: Secondary | ICD-10-CM | POA: Diagnosis not present

## 2019-10-02 DIAGNOSIS — S0522XD Ocular laceration and rupture with prolapse or loss of intraocular tissue, left eye, subsequent encounter: Secondary | ICD-10-CM | POA: Diagnosis not present

## 2019-10-02 DIAGNOSIS — W19XXXD Unspecified fall, subsequent encounter: Secondary | ICD-10-CM | POA: Diagnosis not present

## 2019-10-02 NOTE — Telephone Encounter (Signed)
Onalee Hua with [facility] called requesting verbal orders to continue Home Health PT 2x6wks and 1x3wks Verbal orders given per office protocol.

## 2019-10-03 ENCOUNTER — Telehealth: Payer: Self-pay | Admitting: *Deleted

## 2019-10-03 NOTE — Telephone Encounter (Signed)
Lisa [name] with [name] called requesting verbal orders for OT. 1x5wks.  Verbal orders given.

## 2019-10-07 DIAGNOSIS — Z8673 Personal history of transient ischemic attack (TIA), and cerebral infarction without residual deficits: Secondary | ICD-10-CM | POA: Diagnosis not present

## 2019-10-07 DIAGNOSIS — N183 Chronic kidney disease, stage 3 unspecified: Secondary | ICD-10-CM | POA: Diagnosis not present

## 2019-10-07 DIAGNOSIS — Z7902 Long term (current) use of antithrombotics/antiplatelets: Secondary | ICD-10-CM | POA: Diagnosis not present

## 2019-10-07 DIAGNOSIS — I1310 Hypertensive heart and chronic kidney disease without heart failure, with stage 1 through stage 4 chronic kidney disease, or unspecified chronic kidney disease: Secondary | ICD-10-CM | POA: Diagnosis not present

## 2019-10-07 DIAGNOSIS — K760 Fatty (change of) liver, not elsewhere classified: Secondary | ICD-10-CM | POA: Diagnosis not present

## 2019-10-07 DIAGNOSIS — E782 Mixed hyperlipidemia: Secondary | ICD-10-CM | POA: Diagnosis not present

## 2019-10-07 DIAGNOSIS — J984 Other disorders of lung: Secondary | ICD-10-CM | POA: Diagnosis not present

## 2019-10-07 DIAGNOSIS — E538 Deficiency of other specified B group vitamins: Secondary | ICD-10-CM | POA: Diagnosis not present

## 2019-10-07 DIAGNOSIS — S0522XD Ocular laceration and rupture with prolapse or loss of intraocular tissue, left eye, subsequent encounter: Secondary | ICD-10-CM | POA: Diagnosis not present

## 2019-10-07 DIAGNOSIS — D631 Anemia in chronic kidney disease: Secondary | ICD-10-CM | POA: Diagnosis not present

## 2019-10-07 DIAGNOSIS — W19XXXD Unspecified fall, subsequent encounter: Secondary | ICD-10-CM | POA: Diagnosis not present

## 2019-10-07 DIAGNOSIS — I700 Atherosclerosis of aorta: Secondary | ICD-10-CM | POA: Diagnosis not present

## 2019-10-07 DIAGNOSIS — Z9849 Cataract extraction status, unspecified eye: Secondary | ICD-10-CM | POA: Diagnosis not present

## 2019-10-07 DIAGNOSIS — B9562 Methicillin resistant Staphylococcus aureus infection as the cause of diseases classified elsewhere: Secondary | ICD-10-CM | POA: Diagnosis not present

## 2019-10-07 DIAGNOSIS — Z96652 Presence of left artificial knee joint: Secondary | ICD-10-CM | POA: Diagnosis not present

## 2019-10-07 DIAGNOSIS — Z9181 History of falling: Secondary | ICD-10-CM | POA: Diagnosis not present

## 2019-10-07 DIAGNOSIS — M6281 Muscle weakness (generalized): Secondary | ICD-10-CM | POA: Diagnosis not present

## 2019-10-07 DIAGNOSIS — M47815 Spondylosis without myelopathy or radiculopathy, thoracolumbar region: Secondary | ICD-10-CM | POA: Diagnosis not present

## 2019-10-07 DIAGNOSIS — G4700 Insomnia, unspecified: Secondary | ICD-10-CM | POA: Diagnosis not present

## 2019-10-07 DIAGNOSIS — H348311 Tributary (branch) retinal vein occlusion, right eye, with retinal neovascularization: Secondary | ICD-10-CM | POA: Diagnosis not present

## 2019-10-07 DIAGNOSIS — M5030 Other cervical disc degeneration, unspecified cervical region: Secondary | ICD-10-CM | POA: Diagnosis not present

## 2019-10-07 DIAGNOSIS — H353222 Exudative age-related macular degeneration, left eye, with inactive choroidal neovascularization: Secondary | ICD-10-CM | POA: Diagnosis not present

## 2019-10-07 DIAGNOSIS — N390 Urinary tract infection, site not specified: Secondary | ICD-10-CM | POA: Diagnosis not present

## 2019-10-07 DIAGNOSIS — I951 Orthostatic hypotension: Secondary | ICD-10-CM | POA: Diagnosis not present

## 2019-10-07 DIAGNOSIS — E559 Vitamin D deficiency, unspecified: Secondary | ICD-10-CM | POA: Diagnosis not present

## 2019-10-10 DIAGNOSIS — E559 Vitamin D deficiency, unspecified: Secondary | ICD-10-CM | POA: Diagnosis not present

## 2019-10-10 DIAGNOSIS — J984 Other disorders of lung: Secondary | ICD-10-CM | POA: Diagnosis not present

## 2019-10-10 DIAGNOSIS — W19XXXD Unspecified fall, subsequent encounter: Secondary | ICD-10-CM | POA: Diagnosis not present

## 2019-10-10 DIAGNOSIS — H353222 Exudative age-related macular degeneration, left eye, with inactive choroidal neovascularization: Secondary | ICD-10-CM | POA: Diagnosis not present

## 2019-10-10 DIAGNOSIS — N390 Urinary tract infection, site not specified: Secondary | ICD-10-CM | POA: Diagnosis not present

## 2019-10-10 DIAGNOSIS — Z96652 Presence of left artificial knee joint: Secondary | ICD-10-CM | POA: Diagnosis not present

## 2019-10-10 DIAGNOSIS — N183 Chronic kidney disease, stage 3 unspecified: Secondary | ICD-10-CM | POA: Diagnosis not present

## 2019-10-10 DIAGNOSIS — D631 Anemia in chronic kidney disease: Secondary | ICD-10-CM | POA: Diagnosis not present

## 2019-10-10 DIAGNOSIS — Z9181 History of falling: Secondary | ICD-10-CM | POA: Diagnosis not present

## 2019-10-10 DIAGNOSIS — B9562 Methicillin resistant Staphylococcus aureus infection as the cause of diseases classified elsewhere: Secondary | ICD-10-CM | POA: Diagnosis not present

## 2019-10-10 DIAGNOSIS — I951 Orthostatic hypotension: Secondary | ICD-10-CM | POA: Diagnosis not present

## 2019-10-10 DIAGNOSIS — K760 Fatty (change of) liver, not elsewhere classified: Secondary | ICD-10-CM | POA: Diagnosis not present

## 2019-10-10 DIAGNOSIS — M5030 Other cervical disc degeneration, unspecified cervical region: Secondary | ICD-10-CM | POA: Diagnosis not present

## 2019-10-10 DIAGNOSIS — I1310 Hypertensive heart and chronic kidney disease without heart failure, with stage 1 through stage 4 chronic kidney disease, or unspecified chronic kidney disease: Secondary | ICD-10-CM | POA: Diagnosis not present

## 2019-10-10 DIAGNOSIS — E538 Deficiency of other specified B group vitamins: Secondary | ICD-10-CM | POA: Diagnosis not present

## 2019-10-10 DIAGNOSIS — Z8673 Personal history of transient ischemic attack (TIA), and cerebral infarction without residual deficits: Secondary | ICD-10-CM | POA: Diagnosis not present

## 2019-10-10 DIAGNOSIS — S0522XD Ocular laceration and rupture with prolapse or loss of intraocular tissue, left eye, subsequent encounter: Secondary | ICD-10-CM | POA: Diagnosis not present

## 2019-10-10 DIAGNOSIS — G4700 Insomnia, unspecified: Secondary | ICD-10-CM | POA: Diagnosis not present

## 2019-10-10 DIAGNOSIS — M47815 Spondylosis without myelopathy or radiculopathy, thoracolumbar region: Secondary | ICD-10-CM | POA: Diagnosis not present

## 2019-10-10 DIAGNOSIS — M6281 Muscle weakness (generalized): Secondary | ICD-10-CM | POA: Diagnosis not present

## 2019-10-10 DIAGNOSIS — E782 Mixed hyperlipidemia: Secondary | ICD-10-CM | POA: Diagnosis not present

## 2019-10-10 DIAGNOSIS — Z9849 Cataract extraction status, unspecified eye: Secondary | ICD-10-CM | POA: Diagnosis not present

## 2019-10-10 DIAGNOSIS — H348311 Tributary (branch) retinal vein occlusion, right eye, with retinal neovascularization: Secondary | ICD-10-CM | POA: Diagnosis not present

## 2019-10-10 DIAGNOSIS — I700 Atherosclerosis of aorta: Secondary | ICD-10-CM | POA: Diagnosis not present

## 2019-10-10 DIAGNOSIS — Z7902 Long term (current) use of antithrombotics/antiplatelets: Secondary | ICD-10-CM | POA: Diagnosis not present

## 2019-10-15 DIAGNOSIS — N183 Chronic kidney disease, stage 3 unspecified: Secondary | ICD-10-CM | POA: Diagnosis not present

## 2019-10-15 DIAGNOSIS — Z9181 History of falling: Secondary | ICD-10-CM | POA: Diagnosis not present

## 2019-10-15 DIAGNOSIS — Z8673 Personal history of transient ischemic attack (TIA), and cerebral infarction without residual deficits: Secondary | ICD-10-CM | POA: Diagnosis not present

## 2019-10-15 DIAGNOSIS — H348311 Tributary (branch) retinal vein occlusion, right eye, with retinal neovascularization: Secondary | ICD-10-CM | POA: Diagnosis not present

## 2019-10-15 DIAGNOSIS — M6281 Muscle weakness (generalized): Secondary | ICD-10-CM | POA: Diagnosis not present

## 2019-10-15 DIAGNOSIS — N390 Urinary tract infection, site not specified: Secondary | ICD-10-CM | POA: Diagnosis not present

## 2019-10-15 DIAGNOSIS — E538 Deficiency of other specified B group vitamins: Secondary | ICD-10-CM | POA: Diagnosis not present

## 2019-10-15 DIAGNOSIS — H353222 Exudative age-related macular degeneration, left eye, with inactive choroidal neovascularization: Secondary | ICD-10-CM | POA: Diagnosis not present

## 2019-10-15 DIAGNOSIS — Z9849 Cataract extraction status, unspecified eye: Secondary | ICD-10-CM | POA: Diagnosis not present

## 2019-10-15 DIAGNOSIS — Z7902 Long term (current) use of antithrombotics/antiplatelets: Secondary | ICD-10-CM | POA: Diagnosis not present

## 2019-10-15 DIAGNOSIS — S0522XD Ocular laceration and rupture with prolapse or loss of intraocular tissue, left eye, subsequent encounter: Secondary | ICD-10-CM | POA: Diagnosis not present

## 2019-10-15 DIAGNOSIS — K760 Fatty (change of) liver, not elsewhere classified: Secondary | ICD-10-CM | POA: Diagnosis not present

## 2019-10-15 DIAGNOSIS — G4700 Insomnia, unspecified: Secondary | ICD-10-CM | POA: Diagnosis not present

## 2019-10-15 DIAGNOSIS — I1310 Hypertensive heart and chronic kidney disease without heart failure, with stage 1 through stage 4 chronic kidney disease, or unspecified chronic kidney disease: Secondary | ICD-10-CM | POA: Diagnosis not present

## 2019-10-15 DIAGNOSIS — B9562 Methicillin resistant Staphylococcus aureus infection as the cause of diseases classified elsewhere: Secondary | ICD-10-CM | POA: Diagnosis not present

## 2019-10-15 DIAGNOSIS — M47815 Spondylosis without myelopathy or radiculopathy, thoracolumbar region: Secondary | ICD-10-CM | POA: Diagnosis not present

## 2019-10-15 DIAGNOSIS — I700 Atherosclerosis of aorta: Secondary | ICD-10-CM | POA: Diagnosis not present

## 2019-10-15 DIAGNOSIS — M5030 Other cervical disc degeneration, unspecified cervical region: Secondary | ICD-10-CM | POA: Diagnosis not present

## 2019-10-15 DIAGNOSIS — I951 Orthostatic hypotension: Secondary | ICD-10-CM | POA: Diagnosis not present

## 2019-10-15 DIAGNOSIS — E782 Mixed hyperlipidemia: Secondary | ICD-10-CM | POA: Diagnosis not present

## 2019-10-15 DIAGNOSIS — D631 Anemia in chronic kidney disease: Secondary | ICD-10-CM | POA: Diagnosis not present

## 2019-10-15 DIAGNOSIS — J984 Other disorders of lung: Secondary | ICD-10-CM | POA: Diagnosis not present

## 2019-10-15 DIAGNOSIS — E559 Vitamin D deficiency, unspecified: Secondary | ICD-10-CM | POA: Diagnosis not present

## 2019-10-15 DIAGNOSIS — Z96652 Presence of left artificial knee joint: Secondary | ICD-10-CM | POA: Diagnosis not present

## 2019-10-15 DIAGNOSIS — W19XXXD Unspecified fall, subsequent encounter: Secondary | ICD-10-CM | POA: Diagnosis not present

## 2019-10-17 DIAGNOSIS — I1310 Hypertensive heart and chronic kidney disease without heart failure, with stage 1 through stage 4 chronic kidney disease, or unspecified chronic kidney disease: Secondary | ICD-10-CM | POA: Diagnosis not present

## 2019-10-17 DIAGNOSIS — G4700 Insomnia, unspecified: Secondary | ICD-10-CM | POA: Diagnosis not present

## 2019-10-17 DIAGNOSIS — I700 Atherosclerosis of aorta: Secondary | ICD-10-CM | POA: Diagnosis not present

## 2019-10-17 DIAGNOSIS — M47815 Spondylosis without myelopathy or radiculopathy, thoracolumbar region: Secondary | ICD-10-CM | POA: Diagnosis not present

## 2019-10-17 DIAGNOSIS — N390 Urinary tract infection, site not specified: Secondary | ICD-10-CM | POA: Diagnosis not present

## 2019-10-17 DIAGNOSIS — Z7902 Long term (current) use of antithrombotics/antiplatelets: Secondary | ICD-10-CM | POA: Diagnosis not present

## 2019-10-17 DIAGNOSIS — E538 Deficiency of other specified B group vitamins: Secondary | ICD-10-CM | POA: Diagnosis not present

## 2019-10-17 DIAGNOSIS — B9562 Methicillin resistant Staphylococcus aureus infection as the cause of diseases classified elsewhere: Secondary | ICD-10-CM | POA: Diagnosis not present

## 2019-10-17 DIAGNOSIS — H348311 Tributary (branch) retinal vein occlusion, right eye, with retinal neovascularization: Secondary | ICD-10-CM | POA: Diagnosis not present

## 2019-10-17 DIAGNOSIS — Z96652 Presence of left artificial knee joint: Secondary | ICD-10-CM | POA: Diagnosis not present

## 2019-10-17 DIAGNOSIS — W19XXXD Unspecified fall, subsequent encounter: Secondary | ICD-10-CM | POA: Diagnosis not present

## 2019-10-17 DIAGNOSIS — K760 Fatty (change of) liver, not elsewhere classified: Secondary | ICD-10-CM | POA: Diagnosis not present

## 2019-10-17 DIAGNOSIS — D631 Anemia in chronic kidney disease: Secondary | ICD-10-CM | POA: Diagnosis not present

## 2019-10-17 DIAGNOSIS — S0522XD Ocular laceration and rupture with prolapse or loss of intraocular tissue, left eye, subsequent encounter: Secondary | ICD-10-CM | POA: Diagnosis not present

## 2019-10-17 DIAGNOSIS — I951 Orthostatic hypotension: Secondary | ICD-10-CM | POA: Diagnosis not present

## 2019-10-17 DIAGNOSIS — M5030 Other cervical disc degeneration, unspecified cervical region: Secondary | ICD-10-CM | POA: Diagnosis not present

## 2019-10-17 DIAGNOSIS — Z9181 History of falling: Secondary | ICD-10-CM | POA: Diagnosis not present

## 2019-10-17 DIAGNOSIS — N183 Chronic kidney disease, stage 3 unspecified: Secondary | ICD-10-CM | POA: Diagnosis not present

## 2019-10-17 DIAGNOSIS — E782 Mixed hyperlipidemia: Secondary | ICD-10-CM | POA: Diagnosis not present

## 2019-10-17 DIAGNOSIS — E559 Vitamin D deficiency, unspecified: Secondary | ICD-10-CM | POA: Diagnosis not present

## 2019-10-17 DIAGNOSIS — Z8673 Personal history of transient ischemic attack (TIA), and cerebral infarction without residual deficits: Secondary | ICD-10-CM | POA: Diagnosis not present

## 2019-10-17 DIAGNOSIS — J984 Other disorders of lung: Secondary | ICD-10-CM | POA: Diagnosis not present

## 2019-10-17 DIAGNOSIS — M6281 Muscle weakness (generalized): Secondary | ICD-10-CM | POA: Diagnosis not present

## 2019-10-17 DIAGNOSIS — H353222 Exudative age-related macular degeneration, left eye, with inactive choroidal neovascularization: Secondary | ICD-10-CM | POA: Diagnosis not present

## 2019-10-17 DIAGNOSIS — Z9849 Cataract extraction status, unspecified eye: Secondary | ICD-10-CM | POA: Diagnosis not present

## 2019-10-19 DIAGNOSIS — Z9181 History of falling: Secondary | ICD-10-CM | POA: Diagnosis not present

## 2019-10-19 DIAGNOSIS — M6282 Rhabdomyolysis: Secondary | ICD-10-CM | POA: Diagnosis not present

## 2019-10-19 DIAGNOSIS — R55 Syncope and collapse: Secondary | ICD-10-CM | POA: Diagnosis not present

## 2019-10-20 DIAGNOSIS — H353222 Exudative age-related macular degeneration, left eye, with inactive choroidal neovascularization: Secondary | ICD-10-CM | POA: Diagnosis not present

## 2019-10-20 DIAGNOSIS — E559 Vitamin D deficiency, unspecified: Secondary | ICD-10-CM | POA: Diagnosis not present

## 2019-10-20 DIAGNOSIS — B9562 Methicillin resistant Staphylococcus aureus infection as the cause of diseases classified elsewhere: Secondary | ICD-10-CM | POA: Diagnosis not present

## 2019-10-20 DIAGNOSIS — Z7902 Long term (current) use of antithrombotics/antiplatelets: Secondary | ICD-10-CM | POA: Diagnosis not present

## 2019-10-20 DIAGNOSIS — Z9181 History of falling: Secondary | ICD-10-CM | POA: Diagnosis not present

## 2019-10-20 DIAGNOSIS — I951 Orthostatic hypotension: Secondary | ICD-10-CM | POA: Diagnosis not present

## 2019-10-20 DIAGNOSIS — I700 Atherosclerosis of aorta: Secondary | ICD-10-CM | POA: Diagnosis not present

## 2019-10-20 DIAGNOSIS — Z8673 Personal history of transient ischemic attack (TIA), and cerebral infarction without residual deficits: Secondary | ICD-10-CM | POA: Diagnosis not present

## 2019-10-20 DIAGNOSIS — M6281 Muscle weakness (generalized): Secondary | ICD-10-CM | POA: Diagnosis not present

## 2019-10-20 DIAGNOSIS — W19XXXD Unspecified fall, subsequent encounter: Secondary | ICD-10-CM | POA: Diagnosis not present

## 2019-10-20 DIAGNOSIS — G4700 Insomnia, unspecified: Secondary | ICD-10-CM | POA: Diagnosis not present

## 2019-10-20 DIAGNOSIS — K760 Fatty (change of) liver, not elsewhere classified: Secondary | ICD-10-CM | POA: Diagnosis not present

## 2019-10-20 DIAGNOSIS — S0522XD Ocular laceration and rupture with prolapse or loss of intraocular tissue, left eye, subsequent encounter: Secondary | ICD-10-CM | POA: Diagnosis not present

## 2019-10-20 DIAGNOSIS — N183 Chronic kidney disease, stage 3 unspecified: Secondary | ICD-10-CM | POA: Diagnosis not present

## 2019-10-20 DIAGNOSIS — E782 Mixed hyperlipidemia: Secondary | ICD-10-CM | POA: Diagnosis not present

## 2019-10-20 DIAGNOSIS — Z9849 Cataract extraction status, unspecified eye: Secondary | ICD-10-CM | POA: Diagnosis not present

## 2019-10-20 DIAGNOSIS — M5030 Other cervical disc degeneration, unspecified cervical region: Secondary | ICD-10-CM | POA: Diagnosis not present

## 2019-10-20 DIAGNOSIS — I1310 Hypertensive heart and chronic kidney disease without heart failure, with stage 1 through stage 4 chronic kidney disease, or unspecified chronic kidney disease: Secondary | ICD-10-CM | POA: Diagnosis not present

## 2019-10-20 DIAGNOSIS — J984 Other disorders of lung: Secondary | ICD-10-CM | POA: Diagnosis not present

## 2019-10-20 DIAGNOSIS — E538 Deficiency of other specified B group vitamins: Secondary | ICD-10-CM | POA: Diagnosis not present

## 2019-10-20 DIAGNOSIS — N390 Urinary tract infection, site not specified: Secondary | ICD-10-CM | POA: Diagnosis not present

## 2019-10-20 DIAGNOSIS — H348311 Tributary (branch) retinal vein occlusion, right eye, with retinal neovascularization: Secondary | ICD-10-CM | POA: Diagnosis not present

## 2019-10-20 DIAGNOSIS — M47815 Spondylosis without myelopathy or radiculopathy, thoracolumbar region: Secondary | ICD-10-CM | POA: Diagnosis not present

## 2019-10-20 DIAGNOSIS — D631 Anemia in chronic kidney disease: Secondary | ICD-10-CM | POA: Diagnosis not present

## 2019-10-20 DIAGNOSIS — Z96652 Presence of left artificial knee joint: Secondary | ICD-10-CM | POA: Diagnosis not present

## 2019-10-21 ENCOUNTER — Telehealth: Payer: Self-pay

## 2019-10-21 NOTE — Telephone Encounter (Signed)
Dr. Patric Dykes called on behalf of her mother about some of her medications that she would like to change. Per the facility the only way this can happen is if you send an order or verbal order.  A. Miralax q am @9 . Please stop it , she is using the bathroom way to much B. Trazadone 0.5 mg for sleep is not working can you increase the dosage C. She has been having diarrhea. Fax number for is [phone] please advise

## 2019-10-21 NOTE — Telephone Encounter (Signed)
Dr. Patric Dykes called on behalf of her mother about some of her medications that she would like to change. Per the facility the only way this can happen is if you send an order or verbal order.  A. Miralax q am @9 . Please stop it , she is using the bathroom way to much B. Trazadone 0.5 mg for sleep is not working can you increase the dosage C. She has been having diarrhea. Fax number for is [phone] please advise   Per Dr. [phone].  D/c scheduled miralax. Change the miralax to daily PRN constipation. Trazodone does not come in 0.5mg .  Is she taking 25mg  which is half of a 50mg  pill?  If so, increase trazodone to 50mg  qhs.   order was faxed back to [facility] on 10/21/2018 @ 2.53pm

## 2019-10-21 NOTE — Telephone Encounter (Signed)
D/c scheduled miralax. Change the miralax to daily PRN constipation. Trazodone does not come in 0.5mg .  Is she taking 25mg  which is half of a 50mg  pill?  If so, increase trazodone to 50mg  qhs.

## 2019-10-22 DIAGNOSIS — E782 Mixed hyperlipidemia: Secondary | ICD-10-CM | POA: Diagnosis not present

## 2019-10-22 DIAGNOSIS — D631 Anemia in chronic kidney disease: Secondary | ICD-10-CM | POA: Diagnosis not present

## 2019-10-22 DIAGNOSIS — B9562 Methicillin resistant Staphylococcus aureus infection as the cause of diseases classified elsewhere: Secondary | ICD-10-CM | POA: Diagnosis not present

## 2019-10-22 DIAGNOSIS — W19XXXD Unspecified fall, subsequent encounter: Secondary | ICD-10-CM | POA: Diagnosis not present

## 2019-10-22 DIAGNOSIS — E559 Vitamin D deficiency, unspecified: Secondary | ICD-10-CM | POA: Diagnosis not present

## 2019-10-22 DIAGNOSIS — K760 Fatty (change of) liver, not elsewhere classified: Secondary | ICD-10-CM | POA: Diagnosis not present

## 2019-10-22 DIAGNOSIS — S0522XD Ocular laceration and rupture with prolapse or loss of intraocular tissue, left eye, subsequent encounter: Secondary | ICD-10-CM | POA: Diagnosis not present

## 2019-10-22 DIAGNOSIS — E538 Deficiency of other specified B group vitamins: Secondary | ICD-10-CM | POA: Diagnosis not present

## 2019-10-22 DIAGNOSIS — M47815 Spondylosis without myelopathy or radiculopathy, thoracolumbar region: Secondary | ICD-10-CM | POA: Diagnosis not present

## 2019-10-22 DIAGNOSIS — M5030 Other cervical disc degeneration, unspecified cervical region: Secondary | ICD-10-CM | POA: Diagnosis not present

## 2019-10-22 DIAGNOSIS — Z9181 History of falling: Secondary | ICD-10-CM | POA: Diagnosis not present

## 2019-10-22 DIAGNOSIS — I1310 Hypertensive heart and chronic kidney disease without heart failure, with stage 1 through stage 4 chronic kidney disease, or unspecified chronic kidney disease: Secondary | ICD-10-CM | POA: Diagnosis not present

## 2019-10-22 DIAGNOSIS — H348311 Tributary (branch) retinal vein occlusion, right eye, with retinal neovascularization: Secondary | ICD-10-CM | POA: Diagnosis not present

## 2019-10-22 DIAGNOSIS — Z8673 Personal history of transient ischemic attack (TIA), and cerebral infarction without residual deficits: Secondary | ICD-10-CM | POA: Diagnosis not present

## 2019-10-22 DIAGNOSIS — N390 Urinary tract infection, site not specified: Secondary | ICD-10-CM | POA: Diagnosis not present

## 2019-10-22 DIAGNOSIS — G4700 Insomnia, unspecified: Secondary | ICD-10-CM | POA: Diagnosis not present

## 2019-10-22 DIAGNOSIS — M6281 Muscle weakness (generalized): Secondary | ICD-10-CM | POA: Diagnosis not present

## 2019-10-22 DIAGNOSIS — I700 Atherosclerosis of aorta: Secondary | ICD-10-CM | POA: Diagnosis not present

## 2019-10-22 DIAGNOSIS — H353222 Exudative age-related macular degeneration, left eye, with inactive choroidal neovascularization: Secondary | ICD-10-CM | POA: Diagnosis not present

## 2019-10-22 DIAGNOSIS — I951 Orthostatic hypotension: Secondary | ICD-10-CM | POA: Diagnosis not present

## 2019-10-22 DIAGNOSIS — Z9849 Cataract extraction status, unspecified eye: Secondary | ICD-10-CM | POA: Diagnosis not present

## 2019-10-22 DIAGNOSIS — N183 Chronic kidney disease, stage 3 unspecified: Secondary | ICD-10-CM | POA: Diagnosis not present

## 2019-10-22 DIAGNOSIS — J984 Other disorders of lung: Secondary | ICD-10-CM | POA: Diagnosis not present

## 2019-10-22 DIAGNOSIS — Z96652 Presence of left artificial knee joint: Secondary | ICD-10-CM | POA: Diagnosis not present

## 2019-10-22 DIAGNOSIS — Z7902 Long term (current) use of antithrombotics/antiplatelets: Secondary | ICD-10-CM | POA: Diagnosis not present

## 2019-10-24 DIAGNOSIS — S0522XD Ocular laceration and rupture with prolapse or loss of intraocular tissue, left eye, subsequent encounter: Secondary | ICD-10-CM | POA: Diagnosis not present

## 2019-10-24 DIAGNOSIS — G4700 Insomnia, unspecified: Secondary | ICD-10-CM | POA: Diagnosis not present

## 2019-10-24 DIAGNOSIS — I951 Orthostatic hypotension: Secondary | ICD-10-CM | POA: Diagnosis not present

## 2019-10-24 DIAGNOSIS — Z9849 Cataract extraction status, unspecified eye: Secondary | ICD-10-CM | POA: Diagnosis not present

## 2019-10-24 DIAGNOSIS — I700 Atherosclerosis of aorta: Secondary | ICD-10-CM | POA: Diagnosis not present

## 2019-10-24 DIAGNOSIS — Z96652 Presence of left artificial knee joint: Secondary | ICD-10-CM | POA: Diagnosis not present

## 2019-10-24 DIAGNOSIS — W19XXXD Unspecified fall, subsequent encounter: Secondary | ICD-10-CM | POA: Diagnosis not present

## 2019-10-24 DIAGNOSIS — J984 Other disorders of lung: Secondary | ICD-10-CM | POA: Diagnosis not present

## 2019-10-24 DIAGNOSIS — Z7902 Long term (current) use of antithrombotics/antiplatelets: Secondary | ICD-10-CM | POA: Diagnosis not present

## 2019-10-24 DIAGNOSIS — M6281 Muscle weakness (generalized): Secondary | ICD-10-CM | POA: Diagnosis not present

## 2019-10-24 DIAGNOSIS — N183 Chronic kidney disease, stage 3 unspecified: Secondary | ICD-10-CM | POA: Diagnosis not present

## 2019-10-24 DIAGNOSIS — D631 Anemia in chronic kidney disease: Secondary | ICD-10-CM | POA: Diagnosis not present

## 2019-10-24 DIAGNOSIS — E538 Deficiency of other specified B group vitamins: Secondary | ICD-10-CM | POA: Diagnosis not present

## 2019-10-24 DIAGNOSIS — E559 Vitamin D deficiency, unspecified: Secondary | ICD-10-CM | POA: Diagnosis not present

## 2019-10-24 DIAGNOSIS — M47815 Spondylosis without myelopathy or radiculopathy, thoracolumbar region: Secondary | ICD-10-CM | POA: Diagnosis not present

## 2019-10-24 DIAGNOSIS — E782 Mixed hyperlipidemia: Secondary | ICD-10-CM | POA: Diagnosis not present

## 2019-10-24 DIAGNOSIS — H348311 Tributary (branch) retinal vein occlusion, right eye, with retinal neovascularization: Secondary | ICD-10-CM | POA: Diagnosis not present

## 2019-10-24 DIAGNOSIS — I1310 Hypertensive heart and chronic kidney disease without heart failure, with stage 1 through stage 4 chronic kidney disease, or unspecified chronic kidney disease: Secondary | ICD-10-CM | POA: Diagnosis not present

## 2019-10-24 DIAGNOSIS — N390 Urinary tract infection, site not specified: Secondary | ICD-10-CM | POA: Diagnosis not present

## 2019-10-24 DIAGNOSIS — M5030 Other cervical disc degeneration, unspecified cervical region: Secondary | ICD-10-CM | POA: Diagnosis not present

## 2019-10-24 DIAGNOSIS — K760 Fatty (change of) liver, not elsewhere classified: Secondary | ICD-10-CM | POA: Diagnosis not present

## 2019-10-24 DIAGNOSIS — Z9181 History of falling: Secondary | ICD-10-CM | POA: Diagnosis not present

## 2019-10-24 DIAGNOSIS — B9562 Methicillin resistant Staphylococcus aureus infection as the cause of diseases classified elsewhere: Secondary | ICD-10-CM | POA: Diagnosis not present

## 2019-10-24 DIAGNOSIS — Z8673 Personal history of transient ischemic attack (TIA), and cerebral infarction without residual deficits: Secondary | ICD-10-CM | POA: Diagnosis not present

## 2019-10-24 DIAGNOSIS — H353222 Exudative age-related macular degeneration, left eye, with inactive choroidal neovascularization: Secondary | ICD-10-CM | POA: Diagnosis not present

## 2019-10-27 ENCOUNTER — Other Ambulatory Visit: Payer: Self-pay

## 2019-10-27 ENCOUNTER — Ambulatory Visit (INDEPENDENT_AMBULATORY_CARE_PROVIDER_SITE_OTHER): Payer: Medicare Other | Admitting: Ophthalmology

## 2019-10-27 ENCOUNTER — Encounter (INDEPENDENT_AMBULATORY_CARE_PROVIDER_SITE_OTHER): Payer: Self-pay | Admitting: Ophthalmology

## 2019-10-27 DIAGNOSIS — H353123 Nonexudative age-related macular degeneration, left eye, advanced atrophic without subfoveal involvement: Secondary | ICD-10-CM | POA: Diagnosis not present

## 2019-10-27 DIAGNOSIS — H34831 Tributary (branch) retinal vein occlusion, right eye, with macular edema: Secondary | ICD-10-CM | POA: Diagnosis not present

## 2019-10-27 MED ORDER — BEVACIZUMAB CHEMO INJECTION 1.25MG/0.05ML SYRINGE FOR KALEIDOSCOPE
1.2500 mg | INTRAVITREAL | Status: AC | PRN
  Administered 2019-10-27: 1.25 mg via INTRAVITREAL

## 2019-10-27 NOTE — Progress Notes (Signed)
10/27/2019     CHIEF COMPLAINT Patient presents for Retina Follow Up   HISTORY OF PRESENT ILLNESS: Lisa [name] is a 86 y.o. female who presents to the clinic today for:   HPI    Retina Follow Up    Patient presents with  CRVO/BRVO.  In right eye.  Duration of 6 weeks.  Since onset it is stable.          Comments    6 week follow up - OCT OU, poss Avastin OD Patient denies change in vision and overall has no complaints.        Last edited by Berenice Bouton on 10/27/2019  9:45 AM. (History)      Referring physician: Kermit Balo, DO [address] [place],  [place] [address]  HISTORICAL INFORMATION:   Selected notes from the MEDICAL RECORD NUMBER    Lab Results  Component Value Date   HGBA1C 5.7 (H) 12/26/2018     CURRENT MEDICATIONS: No current outpatient medications on file. (Ophthalmic Drugs)   No current facility-administered medications for this visit. (Ophthalmic Drugs)   Current Outpatient Medications (Other)  Medication Sig  . Acetaminophen 500 MG capsule Take 2 capsules (1,000 mg total) by mouth at bedtime.  Marland Kitchen amoxicillin (AMOXIL) 500 MG capsule Take 2,000 mg by mouth See admin instructions. Take 2,000 mg by mouth one hour prior to dental procedures  . b complex vitamins tablet Take 1 tablet by mouth daily.  . carvedilol (COREG) 6.25 MG tablet Take 6.25 mg by mouth 2 (two) times daily.  . Cholecalciferol (VITAMIN D3) 10 MCG (400 UNIT) CAPS Take 400 Units by mouth daily.  . clobetasol cream (TEMOVATE) 0.05 % Apply 1 application topically 2 (two) times daily.  . clopidogrel (PLAVIX) 75 MG tablet Take 75 mg by mouth daily.  Marland Kitchen conjugated estrogens (PREMARIN) vaginal cream Place 1 Applicatorful vaginally daily.  . Cyanocobalamin (B-12 PO) Take 1 tablet by mouth daily.  . Multiple Vitamins-Minerals (PRESERVISION AREDS 2+MULTI VIT) CAPS Take 1 capsule by mouth 2 (two) times daily.   . traMADol (ULTRAM) 50 MG tablet Take 1 tablet (50 mg total) by mouth 2  (two) times daily as needed for moderate pain (for pain).  . traZODone (DESYREL) 50 MG tablet Take 0.5 tablets (25 mg total) by mouth at bedtime.  Marland Kitchen VITAMIN D PO Take 1 tablet by mouth daily.   No current facility-administered medications for this visit. (Other)      REVIEW OF SYSTEMS:    ALLERGIES Allergies  Allergen Reactions  . Ace Inhibitors Cough  . Lisinopril Cough  . Statins Other (See Comments)    Body aches and pains    PAST MEDICAL HISTORY Past Medical History:  Diagnosis Date  . Cervicalgia   . Chronic kidney disease   . Chronic kidney disease, stage III (moderate)   . Dermatophytosis of groin and perianal area   . Dyspnea   . Dysrhythmia   . Hyperlipidemia LDL goal < 100   . Insomnia, unspecified   . Muscle weakness (generalized)   . Orthostatic hypotension   . Osteoarthrosis, unspecified whether generalized or localized, lower leg    osteoarthritis - upper thoracic area, left knee.  . Other abnormal glucose   . Other B-complex deficiencies   . Other malaise and fatigue   . Palpitations   . Senile cataract, unspecified   . Shortness of breath   . Stroke [facility])    small "stroke" - right sided weakness for a  day.  . Stroke (HCC)   . Tachycardia, unspecified   . Undiagnosed cardiac murmurs   . Unspecified essential hypertension   . Unspecified vitamin D deficiency   . Urinary tract infection    Past Surgical History:  Procedure Laterality Date  . CATARACT EXTRACTION W/PHACO Bilateral 1994   Dr. Dione BoozeGroat  . CATARACT EXTRACTION, BILATERAL Bilateral   . EYE SURGERY     cataract  . RUPTURED GLOBE EXPLORATION AND REPAIR Left 09/03/2019   Procedure: Scleral laceration repair LEFT EYE, exam under anesthesia LEFT EYE;  Surgeon: Olivia CanterGroat, Richard Scott, MD;  [facility];  Service: Ophthalmology;  Laterality: Left;  . TOE AMPUTATION  2002  . TONSILLECTOMY  1944  . TONSILLECTOMY     younger yrs  . TOTAL KNEE ARTHROPLASTY Left 01/15/2015   Procedure: LEFT  TOTAL KNEE ARTHROPLASTY;  Surgeon: Eugenia Mcalpineobert Collins, MD;  Location: [facility] ORS;  Service: Orthopedics;  Laterality: Left;  . TUBAL LIGATION  1951    FAMILY HISTORY Family History  Problem Relation Age of Onset  . Cerebral aneurysm Mother   . Heart attack Father   . Alzheimer's disease Sister   . Emphysema Sister   . Stroke Sister   . Alzheimer's disease Sister   . Arthritis Sister   . Stroke Sister        Age 84   . Lung cancer Son     SOCIAL HISTORY Social History   Tobacco Use  . Smoking status: Never Smoker  . Smokeless tobacco: Never Used  Vaping Use  . Vaping Use: Never used  Substance Use Topics  . Alcohol use: Yes    Comment: rare social every 3 or 4 months wine  . Drug use: No         OPHTHALMIC EXAM:  Base Eye Exam    Visual Acuity (Snellen - Linear)      Right Left   Dist cc 20/30 20/400   Dist ph cc  NI   Correction: Glasses       Tonometry (Tonopen, 9:52 AM)      Right Left   Pressure 9 11       Pupils      Dark Light Shape React APD   Right 3 2 Round Sluggish None   Left 6 6 Irregular Minimal None       Visual Fields (Counting fingers)      Left Right    Full Full       Extraocular Movement      Right Left    Full Full       Neuro/Psych    Oriented x3: Yes   Mood/Affect: Normal       Dilation    Both eyes: 1.0% Mydriacyl, 2.5% Phenylephrine @ 9:53 AM        Slit Lamp and Fundus Exam    External Exam      Right Left   External Normal Normal       Slit Lamp Exam      Right Left   Lids/Lashes Normal Normal   Conjunctiva/Sclera White and quiet White and quiet   Cornea Clear Clear   Anterior Chamber Deep and quiet Deep and quiet   Iris Round and reactive keyhole iris loss from 8-9 meridian nasally   Lens Posterior chamber intraocular lens Posterior chamber intraocular lens   Anterior Vitreous Normal Normal, old pigment posterior to iol       Fundus Exam      Right Left  Posterior Vitreous Posterior vitreous detachment  Normal, mild old white vitreous debris   Disc Normal Normal   C/D Ratio 0.25 0.3   Macula no macular thickening, Hard drusen, no exudates, no hemorrhage Retinal pigment epithelial mottling   Vessels Inferior macular branch retinal vein occlusion.  Much less thickening Normal   Periphery Normal Normal, attached and no choroidals          IMAGING AND PROCEDURES  Imaging and Procedures for 10/27/19  OCT, Retina - OU - Both Eyes       Right Eye Quality was good. Scan locations included subfoveal. Central Foveal Thickness: 275. Progression has been stable. Findings include cystoid macular edema.   Left Eye Quality was good. Scan locations included subfoveal. Central Foveal Thickness: 380. Findings include subretinal hyper-reflective material, retinal drusen , no SRF, no IRF.   Notes Region of CME from superior branch retinal vein occlusion OD , Minor at 6-week interval post intravitreal Avastin  OS with large subfoveal drusenoid debris deposit unchanged over the last 9 to 12 months       Intravitreal Injection, Pharmacologic Agent - OD - Right Eye       Time Out 10/27/2019. 10:18 AM. Confirmed correct patient, procedure, site, and patient consented.   Anesthesia Topical anesthesia was used. Anesthetic medications included Akten 3.5%.   Procedure Preparation included Tobramycin 0.3%, 10% betadine to eyelids, 5% betadine to ocular surface. A 30 gauge needle was used.   Injection:  1.25 mg Bevacizumab (AVASTIN) SOLN   NDC: [address]-7, Lot: [id number]   Route: Intravitreal, Site: Right Eye, Waste: 0 mg  Post-op Post injection exam found visual acuity of at least counting fingers. The patient tolerated the procedure well. There were no complications. The patient received written and verbal post procedure care education. Post injection medications were not given.                 ASSESSMENT/PLAN:  No problem-specific Assessment & Plan notes found for this encounter.       ICD-10-CM   1. Branch retinal vein occlusion with macular edema of right eye  H34.8310 OCT, Retina - OU - Both Eyes    Intravitreal Injection, Pharmacologic Agent - OD - Right Eye    Bevacizumab (AVASTIN) SOLN 1.25 mg  2. Nonexudative age-related macular degeneration, left eye, advanced atrophic without subfoveal involvement  H35.3123 OCT, Retina - OU - Both Eyes    1.  2.  3.  Ophthalmic Meds Ordered this visit:  Meds ordered this encounter  Medications  . Bevacizumab (AVASTIN) SOLN 1.25 mg       Return in about 7 weeks (around 12/15/2019) for dilate, OD, AVASTIN OCT.  Patient Instructions  Patient to notify the office if new onset visual decline or distortion were to develop in either eye    Explained the diagnoses, plan, and follow up with the patient and they expressed understanding.  Patient expressed understanding of the importance of proper follow up care.   Alford Highland Catheryn Slifer M.D. Diseases & Surgery of the Retina and Vitreous [facility] 10/27/19     Abbreviations: M myopia (nearsighted); A astigmatism; H hyperopia (farsighted); P presbyopia; Mrx spectacle prescription;  CTL contact lenses; OD right eye; OS left eye; OU both eyes  XT exotropia; ET esotropia; PEK punctate epithelial keratitis; PEE punctate epithelial erosions; DES dry eye syndrome; MGD meibomian gland dysfunction; ATs artificial tears; PFAT's preservative free artificial tears; NSC nuclear sclerotic cataract; PSC posterior subcapsular cataract; ERM epi-retinal membrane; PVD  posterior vitreous detachment; RD retinal detachment; DM diabetes mellitus; DR diabetic retinopathy; NPDR non-proliferative diabetic retinopathy; PDR proliferative diabetic retinopathy; CSME clinically significant macular edema; DME diabetic macular edema; dbh dot blot hemorrhages; CWS cotton wool spot; POAG primary open angle glaucoma; C/D cup-to-disc ratio; HVF humphrey visual field; GVF goldmann visual field; OCT  optical coherence tomography; IOP intraocular pressure; BRVO Branch retinal vein occlusion; CRVO central retinal vein occlusion; CRAO central retinal artery occlusion; BRAO branch retinal artery occlusion; RT retinal tear; SB scleral buckle; PPV pars plana vitrectomy; VH Vitreous hemorrhage; PRP panretinal laser photocoagulation; IVK intravitreal kenalog; VMT vitreomacular traction; MH Macular hole;  NVD neovascularization of the disc; NVE neovascularization elsewhere; AREDS age related eye disease study; ARMD age related macular degeneration; POAG primary open angle glaucoma; EBMD epithelial/anterior basement membrane dystrophy; ACIOL anterior chamber intraocular lens; IOL intraocular lens; PCIOL posterior chamber intraocular lens; Phaco/IOL phacoemulsification with intraocular lens placement; PRK photorefractive keratectomy; LASIK laser assisted in situ keratomileusis; HTN hypertension; DM diabetes mellitus; COPD chronic obstructive pulmonary disease

## 2019-10-27 NOTE — Patient Instructions (Signed)
Patient to notify the office if new onset visual decline or distortion were to develop in either eye

## 2019-10-28 ENCOUNTER — Encounter (INDEPENDENT_AMBULATORY_CARE_PROVIDER_SITE_OTHER): Payer: Medicare Other | Admitting: Ophthalmology

## 2019-10-28 DIAGNOSIS — Z96652 Presence of left artificial knee joint: Secondary | ICD-10-CM | POA: Diagnosis not present

## 2019-10-28 DIAGNOSIS — N183 Chronic kidney disease, stage 3 unspecified: Secondary | ICD-10-CM | POA: Diagnosis not present

## 2019-10-28 DIAGNOSIS — S0522XD Ocular laceration and rupture with prolapse or loss of intraocular tissue, left eye, subsequent encounter: Secondary | ICD-10-CM | POA: Diagnosis not present

## 2019-10-28 DIAGNOSIS — B9562 Methicillin resistant Staphylococcus aureus infection as the cause of diseases classified elsewhere: Secondary | ICD-10-CM | POA: Diagnosis not present

## 2019-10-28 DIAGNOSIS — G4700 Insomnia, unspecified: Secondary | ICD-10-CM | POA: Diagnosis not present

## 2019-10-28 DIAGNOSIS — E782 Mixed hyperlipidemia: Secondary | ICD-10-CM | POA: Diagnosis not present

## 2019-10-28 DIAGNOSIS — M47815 Spondylosis without myelopathy or radiculopathy, thoracolumbar region: Secondary | ICD-10-CM | POA: Diagnosis not present

## 2019-10-28 DIAGNOSIS — H348311 Tributary (branch) retinal vein occlusion, right eye, with retinal neovascularization: Secondary | ICD-10-CM | POA: Diagnosis not present

## 2019-10-28 DIAGNOSIS — I700 Atherosclerosis of aorta: Secondary | ICD-10-CM | POA: Diagnosis not present

## 2019-10-28 DIAGNOSIS — K760 Fatty (change of) liver, not elsewhere classified: Secondary | ICD-10-CM | POA: Diagnosis not present

## 2019-10-28 DIAGNOSIS — N390 Urinary tract infection, site not specified: Secondary | ICD-10-CM | POA: Diagnosis not present

## 2019-10-28 DIAGNOSIS — I951 Orthostatic hypotension: Secondary | ICD-10-CM | POA: Diagnosis not present

## 2019-10-28 DIAGNOSIS — Z8673 Personal history of transient ischemic attack (TIA), and cerebral infarction without residual deficits: Secondary | ICD-10-CM | POA: Diagnosis not present

## 2019-10-28 DIAGNOSIS — E538 Deficiency of other specified B group vitamins: Secondary | ICD-10-CM | POA: Diagnosis not present

## 2019-10-28 DIAGNOSIS — Z7902 Long term (current) use of antithrombotics/antiplatelets: Secondary | ICD-10-CM | POA: Diagnosis not present

## 2019-10-28 DIAGNOSIS — M5030 Other cervical disc degeneration, unspecified cervical region: Secondary | ICD-10-CM | POA: Diagnosis not present

## 2019-10-28 DIAGNOSIS — J984 Other disorders of lung: Secondary | ICD-10-CM | POA: Diagnosis not present

## 2019-10-28 DIAGNOSIS — D631 Anemia in chronic kidney disease: Secondary | ICD-10-CM | POA: Diagnosis not present

## 2019-10-28 DIAGNOSIS — Z9181 History of falling: Secondary | ICD-10-CM | POA: Diagnosis not present

## 2019-10-28 DIAGNOSIS — M6281 Muscle weakness (generalized): Secondary | ICD-10-CM | POA: Diagnosis not present

## 2019-10-28 DIAGNOSIS — H353222 Exudative age-related macular degeneration, left eye, with inactive choroidal neovascularization: Secondary | ICD-10-CM | POA: Diagnosis not present

## 2019-10-28 DIAGNOSIS — W19XXXD Unspecified fall, subsequent encounter: Secondary | ICD-10-CM | POA: Diagnosis not present

## 2019-10-28 DIAGNOSIS — I1310 Hypertensive heart and chronic kidney disease without heart failure, with stage 1 through stage 4 chronic kidney disease, or unspecified chronic kidney disease: Secondary | ICD-10-CM | POA: Diagnosis not present

## 2019-10-28 DIAGNOSIS — E559 Vitamin D deficiency, unspecified: Secondary | ICD-10-CM | POA: Diagnosis not present

## 2019-10-28 DIAGNOSIS — Z9849 Cataract extraction status, unspecified eye: Secondary | ICD-10-CM | POA: Diagnosis not present

## 2019-10-30 ENCOUNTER — Telehealth: Payer: Self-pay

## 2019-10-30 ENCOUNTER — Encounter: Payer: Self-pay | Admitting: Internal Medicine

## 2019-10-30 DIAGNOSIS — F419 Anxiety disorder, unspecified: Secondary | ICD-10-CM

## 2019-10-30 DIAGNOSIS — H903 Sensorineural hearing loss, bilateral: Secondary | ICD-10-CM | POA: Diagnosis not present

## 2019-10-30 MED ORDER — SERTRALINE HCL 25 MG PO TABS: 25.0000 mg | ORAL_TABLET | Freq: Every day | ORAL | 0 refills | Status: DC

## 2019-10-30 NOTE — Telephone Encounter (Signed)
Incoming call received from patients daughter requesting rx for zoloft. Patient was on zoloft in the past and went off of it. Olegario Messier states patient needs something to help her relax.   If Dr.Reed is in agreement with this patient would like rx sent to [facility] also mentioned that patient is having abdominal pain and may have a UTI and needs something for pain. Patient is under the care of a urologist and I advise patient follow-up to r/o UTI.   Olegario Messier then asked if Dr.Reed knows anything about medical marijuana  I informed patient that is not our area of speciality.   Please advise on request to restart Zoloft

## 2019-10-30 NOTE — Telephone Encounter (Signed)
Ok to resume zoloft 25mg  daily for depression and anxiety #90

## 2019-10-30 NOTE — Telephone Encounter (Signed)
High risk or very high risk warning populated when attempting to refill medication, rx approval sent to PCP for review.

## 2019-10-31 DIAGNOSIS — H348311 Tributary (branch) retinal vein occlusion, right eye, with retinal neovascularization: Secondary | ICD-10-CM | POA: Diagnosis not present

## 2019-10-31 DIAGNOSIS — Z9849 Cataract extraction status, unspecified eye: Secondary | ICD-10-CM | POA: Diagnosis not present

## 2019-10-31 DIAGNOSIS — I700 Atherosclerosis of aorta: Secondary | ICD-10-CM | POA: Diagnosis not present

## 2019-10-31 DIAGNOSIS — E782 Mixed hyperlipidemia: Secondary | ICD-10-CM | POA: Diagnosis not present

## 2019-10-31 DIAGNOSIS — H353222 Exudative age-related macular degeneration, left eye, with inactive choroidal neovascularization: Secondary | ICD-10-CM | POA: Diagnosis not present

## 2019-10-31 DIAGNOSIS — M5030 Other cervical disc degeneration, unspecified cervical region: Secondary | ICD-10-CM | POA: Diagnosis not present

## 2019-10-31 DIAGNOSIS — B9562 Methicillin resistant Staphylococcus aureus infection as the cause of diseases classified elsewhere: Secondary | ICD-10-CM | POA: Diagnosis not present

## 2019-10-31 DIAGNOSIS — K760 Fatty (change of) liver, not elsewhere classified: Secondary | ICD-10-CM | POA: Diagnosis not present

## 2019-10-31 DIAGNOSIS — N183 Chronic kidney disease, stage 3 unspecified: Secondary | ICD-10-CM | POA: Diagnosis not present

## 2019-10-31 DIAGNOSIS — D631 Anemia in chronic kidney disease: Secondary | ICD-10-CM | POA: Diagnosis not present

## 2019-10-31 DIAGNOSIS — Z9181 History of falling: Secondary | ICD-10-CM | POA: Diagnosis not present

## 2019-10-31 DIAGNOSIS — M47815 Spondylosis without myelopathy or radiculopathy, thoracolumbar region: Secondary | ICD-10-CM | POA: Diagnosis not present

## 2019-10-31 DIAGNOSIS — E559 Vitamin D deficiency, unspecified: Secondary | ICD-10-CM | POA: Diagnosis not present

## 2019-10-31 DIAGNOSIS — Z8673 Personal history of transient ischemic attack (TIA), and cerebral infarction without residual deficits: Secondary | ICD-10-CM | POA: Diagnosis not present

## 2019-10-31 DIAGNOSIS — Z7902 Long term (current) use of antithrombotics/antiplatelets: Secondary | ICD-10-CM | POA: Diagnosis not present

## 2019-10-31 DIAGNOSIS — Z96652 Presence of left artificial knee joint: Secondary | ICD-10-CM | POA: Diagnosis not present

## 2019-10-31 DIAGNOSIS — S0522XD Ocular laceration and rupture with prolapse or loss of intraocular tissue, left eye, subsequent encounter: Secondary | ICD-10-CM | POA: Diagnosis not present

## 2019-10-31 DIAGNOSIS — J984 Other disorders of lung: Secondary | ICD-10-CM | POA: Diagnosis not present

## 2019-10-31 DIAGNOSIS — W19XXXD Unspecified fall, subsequent encounter: Secondary | ICD-10-CM | POA: Diagnosis not present

## 2019-10-31 DIAGNOSIS — M6281 Muscle weakness (generalized): Secondary | ICD-10-CM | POA: Diagnosis not present

## 2019-10-31 DIAGNOSIS — I951 Orthostatic hypotension: Secondary | ICD-10-CM | POA: Diagnosis not present

## 2019-10-31 DIAGNOSIS — E538 Deficiency of other specified B group vitamins: Secondary | ICD-10-CM | POA: Diagnosis not present

## 2019-10-31 DIAGNOSIS — I1310 Hypertensive heart and chronic kidney disease without heart failure, with stage 1 through stage 4 chronic kidney disease, or unspecified chronic kidney disease: Secondary | ICD-10-CM | POA: Diagnosis not present

## 2019-10-31 DIAGNOSIS — N390 Urinary tract infection, site not specified: Secondary | ICD-10-CM | POA: Diagnosis not present

## 2019-10-31 DIAGNOSIS — G4700 Insomnia, unspecified: Secondary | ICD-10-CM | POA: Diagnosis not present

## 2019-11-03 DIAGNOSIS — E559 Vitamin D deficiency, unspecified: Secondary | ICD-10-CM | POA: Diagnosis not present

## 2019-11-03 DIAGNOSIS — E782 Mixed hyperlipidemia: Secondary | ICD-10-CM | POA: Diagnosis not present

## 2019-11-03 DIAGNOSIS — H5213 Myopia, bilateral: Secondary | ICD-10-CM | POA: Diagnosis not present

## 2019-11-03 DIAGNOSIS — H52203 Unspecified astigmatism, bilateral: Secondary | ICD-10-CM | POA: Diagnosis not present

## 2019-11-03 DIAGNOSIS — H353222 Exudative age-related macular degeneration, left eye, with inactive choroidal neovascularization: Secondary | ICD-10-CM | POA: Diagnosis not present

## 2019-11-03 DIAGNOSIS — Z8673 Personal history of transient ischemic attack (TIA), and cerebral infarction without residual deficits: Secondary | ICD-10-CM | POA: Diagnosis not present

## 2019-11-03 DIAGNOSIS — K760 Fatty (change of) liver, not elsewhere classified: Secondary | ICD-10-CM | POA: Diagnosis not present

## 2019-11-03 DIAGNOSIS — H524 Presbyopia: Secondary | ICD-10-CM | POA: Diagnosis not present

## 2019-11-03 DIAGNOSIS — B9562 Methicillin resistant Staphylococcus aureus infection as the cause of diseases classified elsewhere: Secondary | ICD-10-CM | POA: Diagnosis not present

## 2019-11-03 DIAGNOSIS — M6281 Muscle weakness (generalized): Secondary | ICD-10-CM | POA: Diagnosis not present

## 2019-11-03 DIAGNOSIS — M47815 Spondylosis without myelopathy or radiculopathy, thoracolumbar region: Secondary | ICD-10-CM | POA: Diagnosis not present

## 2019-11-03 DIAGNOSIS — W19XXXD Unspecified fall, subsequent encounter: Secondary | ICD-10-CM | POA: Diagnosis not present

## 2019-11-03 DIAGNOSIS — Z9849 Cataract extraction status, unspecified eye: Secondary | ICD-10-CM | POA: Diagnosis not present

## 2019-11-03 DIAGNOSIS — E538 Deficiency of other specified B group vitamins: Secondary | ICD-10-CM | POA: Diagnosis not present

## 2019-11-03 DIAGNOSIS — H348311 Tributary (branch) retinal vein occlusion, right eye, with retinal neovascularization: Secondary | ICD-10-CM | POA: Diagnosis not present

## 2019-11-03 DIAGNOSIS — I700 Atherosclerosis of aorta: Secondary | ICD-10-CM | POA: Diagnosis not present

## 2019-11-03 DIAGNOSIS — J984 Other disorders of lung: Secondary | ICD-10-CM | POA: Diagnosis not present

## 2019-11-03 DIAGNOSIS — D631 Anemia in chronic kidney disease: Secondary | ICD-10-CM | POA: Diagnosis not present

## 2019-11-03 DIAGNOSIS — N183 Chronic kidney disease, stage 3 unspecified: Secondary | ICD-10-CM | POA: Diagnosis not present

## 2019-11-03 DIAGNOSIS — G4700 Insomnia, unspecified: Secondary | ICD-10-CM | POA: Diagnosis not present

## 2019-11-03 DIAGNOSIS — Z96652 Presence of left artificial knee joint: Secondary | ICD-10-CM | POA: Diagnosis not present

## 2019-11-03 DIAGNOSIS — Z9181 History of falling: Secondary | ICD-10-CM | POA: Diagnosis not present

## 2019-11-03 DIAGNOSIS — M5030 Other cervical disc degeneration, unspecified cervical region: Secondary | ICD-10-CM | POA: Diagnosis not present

## 2019-11-03 DIAGNOSIS — I951 Orthostatic hypotension: Secondary | ICD-10-CM | POA: Diagnosis not present

## 2019-11-03 DIAGNOSIS — Z7902 Long term (current) use of antithrombotics/antiplatelets: Secondary | ICD-10-CM | POA: Diagnosis not present

## 2019-11-03 DIAGNOSIS — S0522XD Ocular laceration and rupture with prolapse or loss of intraocular tissue, left eye, subsequent encounter: Secondary | ICD-10-CM | POA: Diagnosis not present

## 2019-11-03 DIAGNOSIS — N390 Urinary tract infection, site not specified: Secondary | ICD-10-CM | POA: Diagnosis not present

## 2019-11-03 DIAGNOSIS — I1310 Hypertensive heart and chronic kidney disease without heart failure, with stage 1 through stage 4 chronic kidney disease, or unspecified chronic kidney disease: Secondary | ICD-10-CM | POA: Diagnosis not present

## 2019-11-04 DIAGNOSIS — N952 Postmenopausal atrophic vaginitis: Secondary | ICD-10-CM | POA: Diagnosis not present

## 2019-11-04 DIAGNOSIS — R350 Frequency of micturition: Secondary | ICD-10-CM | POA: Diagnosis not present

## 2019-11-04 DIAGNOSIS — N3020 Other chronic cystitis without hematuria: Secondary | ICD-10-CM | POA: Diagnosis not present

## 2019-11-05 DIAGNOSIS — E782 Mixed hyperlipidemia: Secondary | ICD-10-CM | POA: Diagnosis not present

## 2019-11-05 DIAGNOSIS — H353222 Exudative age-related macular degeneration, left eye, with inactive choroidal neovascularization: Secondary | ICD-10-CM | POA: Diagnosis not present

## 2019-11-05 DIAGNOSIS — J984 Other disorders of lung: Secondary | ICD-10-CM | POA: Diagnosis not present

## 2019-11-05 DIAGNOSIS — N183 Chronic kidney disease, stage 3 unspecified: Secondary | ICD-10-CM | POA: Diagnosis not present

## 2019-11-05 DIAGNOSIS — N390 Urinary tract infection, site not specified: Secondary | ICD-10-CM | POA: Diagnosis not present

## 2019-11-05 DIAGNOSIS — S0522XD Ocular laceration and rupture with prolapse or loss of intraocular tissue, left eye, subsequent encounter: Secondary | ICD-10-CM | POA: Diagnosis not present

## 2019-11-05 DIAGNOSIS — I1310 Hypertensive heart and chronic kidney disease without heart failure, with stage 1 through stage 4 chronic kidney disease, or unspecified chronic kidney disease: Secondary | ICD-10-CM | POA: Diagnosis not present

## 2019-11-05 DIAGNOSIS — I700 Atherosclerosis of aorta: Secondary | ICD-10-CM | POA: Diagnosis not present

## 2019-11-05 DIAGNOSIS — Z9181 History of falling: Secondary | ICD-10-CM | POA: Diagnosis not present

## 2019-11-05 DIAGNOSIS — M6281 Muscle weakness (generalized): Secondary | ICD-10-CM | POA: Diagnosis not present

## 2019-11-05 DIAGNOSIS — Z9849 Cataract extraction status, unspecified eye: Secondary | ICD-10-CM | POA: Diagnosis not present

## 2019-11-05 DIAGNOSIS — Z7902 Long term (current) use of antithrombotics/antiplatelets: Secondary | ICD-10-CM | POA: Diagnosis not present

## 2019-11-05 DIAGNOSIS — Z96652 Presence of left artificial knee joint: Secondary | ICD-10-CM | POA: Diagnosis not present

## 2019-11-05 DIAGNOSIS — K760 Fatty (change of) liver, not elsewhere classified: Secondary | ICD-10-CM | POA: Diagnosis not present

## 2019-11-05 DIAGNOSIS — M47815 Spondylosis without myelopathy or radiculopathy, thoracolumbar region: Secondary | ICD-10-CM | POA: Diagnosis not present

## 2019-11-05 DIAGNOSIS — I951 Orthostatic hypotension: Secondary | ICD-10-CM | POA: Diagnosis not present

## 2019-11-05 DIAGNOSIS — B9562 Methicillin resistant Staphylococcus aureus infection as the cause of diseases classified elsewhere: Secondary | ICD-10-CM | POA: Diagnosis not present

## 2019-11-05 DIAGNOSIS — Z8673 Personal history of transient ischemic attack (TIA), and cerebral infarction without residual deficits: Secondary | ICD-10-CM | POA: Diagnosis not present

## 2019-11-05 DIAGNOSIS — E559 Vitamin D deficiency, unspecified: Secondary | ICD-10-CM | POA: Diagnosis not present

## 2019-11-05 DIAGNOSIS — W19XXXD Unspecified fall, subsequent encounter: Secondary | ICD-10-CM | POA: Diagnosis not present

## 2019-11-05 DIAGNOSIS — H348311 Tributary (branch) retinal vein occlusion, right eye, with retinal neovascularization: Secondary | ICD-10-CM | POA: Diagnosis not present

## 2019-11-05 DIAGNOSIS — M5030 Other cervical disc degeneration, unspecified cervical region: Secondary | ICD-10-CM | POA: Diagnosis not present

## 2019-11-05 DIAGNOSIS — G4700 Insomnia, unspecified: Secondary | ICD-10-CM | POA: Diagnosis not present

## 2019-11-05 DIAGNOSIS — E538 Deficiency of other specified B group vitamins: Secondary | ICD-10-CM | POA: Diagnosis not present

## 2019-11-05 DIAGNOSIS — D631 Anemia in chronic kidney disease: Secondary | ICD-10-CM | POA: Diagnosis not present

## 2019-11-11 ENCOUNTER — Telehealth: Payer: Self-pay

## 2019-11-11 DIAGNOSIS — W19XXXD Unspecified fall, subsequent encounter: Secondary | ICD-10-CM | POA: Diagnosis not present

## 2019-11-11 DIAGNOSIS — K760 Fatty (change of) liver, not elsewhere classified: Secondary | ICD-10-CM | POA: Diagnosis not present

## 2019-11-11 DIAGNOSIS — J984 Other disorders of lung: Secondary | ICD-10-CM | POA: Diagnosis not present

## 2019-11-11 DIAGNOSIS — B9562 Methicillin resistant Staphylococcus aureus infection as the cause of diseases classified elsewhere: Secondary | ICD-10-CM | POA: Diagnosis not present

## 2019-11-11 DIAGNOSIS — E538 Deficiency of other specified B group vitamins: Secondary | ICD-10-CM | POA: Diagnosis not present

## 2019-11-11 DIAGNOSIS — G4700 Insomnia, unspecified: Secondary | ICD-10-CM | POA: Diagnosis not present

## 2019-11-11 DIAGNOSIS — Z9849 Cataract extraction status, unspecified eye: Secondary | ICD-10-CM | POA: Diagnosis not present

## 2019-11-11 DIAGNOSIS — N390 Urinary tract infection, site not specified: Secondary | ICD-10-CM | POA: Diagnosis not present

## 2019-11-11 DIAGNOSIS — M6281 Muscle weakness (generalized): Secondary | ICD-10-CM | POA: Diagnosis not present

## 2019-11-11 DIAGNOSIS — H348311 Tributary (branch) retinal vein occlusion, right eye, with retinal neovascularization: Secondary | ICD-10-CM | POA: Diagnosis not present

## 2019-11-11 DIAGNOSIS — E559 Vitamin D deficiency, unspecified: Secondary | ICD-10-CM | POA: Diagnosis not present

## 2019-11-11 DIAGNOSIS — S0522XD Ocular laceration and rupture with prolapse or loss of intraocular tissue, left eye, subsequent encounter: Secondary | ICD-10-CM | POA: Diagnosis not present

## 2019-11-11 DIAGNOSIS — Z9181 History of falling: Secondary | ICD-10-CM | POA: Diagnosis not present

## 2019-11-11 DIAGNOSIS — D631 Anemia in chronic kidney disease: Secondary | ICD-10-CM | POA: Diagnosis not present

## 2019-11-11 DIAGNOSIS — I700 Atherosclerosis of aorta: Secondary | ICD-10-CM | POA: Diagnosis not present

## 2019-11-11 DIAGNOSIS — I951 Orthostatic hypotension: Secondary | ICD-10-CM | POA: Diagnosis not present

## 2019-11-11 DIAGNOSIS — Z7902 Long term (current) use of antithrombotics/antiplatelets: Secondary | ICD-10-CM | POA: Diagnosis not present

## 2019-11-11 DIAGNOSIS — Z8673 Personal history of transient ischemic attack (TIA), and cerebral infarction without residual deficits: Secondary | ICD-10-CM | POA: Diagnosis not present

## 2019-11-11 DIAGNOSIS — N183 Chronic kidney disease, stage 3 unspecified: Secondary | ICD-10-CM | POA: Diagnosis not present

## 2019-11-11 DIAGNOSIS — M47815 Spondylosis without myelopathy or radiculopathy, thoracolumbar region: Secondary | ICD-10-CM | POA: Diagnosis not present

## 2019-11-11 DIAGNOSIS — H353222 Exudative age-related macular degeneration, left eye, with inactive choroidal neovascularization: Secondary | ICD-10-CM | POA: Diagnosis not present

## 2019-11-11 DIAGNOSIS — Z96652 Presence of left artificial knee joint: Secondary | ICD-10-CM | POA: Diagnosis not present

## 2019-11-11 DIAGNOSIS — M5030 Other cervical disc degeneration, unspecified cervical region: Secondary | ICD-10-CM | POA: Diagnosis not present

## 2019-11-11 DIAGNOSIS — E782 Mixed hyperlipidemia: Secondary | ICD-10-CM | POA: Diagnosis not present

## 2019-11-11 DIAGNOSIS — I1310 Hypertensive heart and chronic kidney disease without heart failure, with stage 1 through stage 4 chronic kidney disease, or unspecified chronic kidney disease: Secondary | ICD-10-CM | POA: Diagnosis not present

## 2019-11-11 NOTE — Telephone Encounter (Signed)
Incoming call received from [place] with [facility] asking for clarification on written order for Zofran sent by Dr.Reed via fax  Per Misty Stanley order was received for zofran 6 mg and zofran comes in 4 or 8 mg.  I was unable to assist [facility] for Zofran is not listed on patient current medication list.  Please advise

## 2019-11-11 NOTE — Telephone Encounter (Signed)
Handwritten fax yesterday when no one was in the office any longer to help with anything at 6pm last night or so.  Let's go with 4mg .  There were 3 doses at one point.

## 2019-11-11 NOTE — Telephone Encounter (Signed)
I called [facility] [name] and informed her of Dr.Reed's response. I will also fax this telephone encounter to [phone]

## 2019-11-14 DIAGNOSIS — E538 Deficiency of other specified B group vitamins: Secondary | ICD-10-CM | POA: Diagnosis not present

## 2019-11-14 DIAGNOSIS — I951 Orthostatic hypotension: Secondary | ICD-10-CM | POA: Diagnosis not present

## 2019-11-14 DIAGNOSIS — D631 Anemia in chronic kidney disease: Secondary | ICD-10-CM | POA: Diagnosis not present

## 2019-11-14 DIAGNOSIS — K760 Fatty (change of) liver, not elsewhere classified: Secondary | ICD-10-CM | POA: Diagnosis not present

## 2019-11-14 DIAGNOSIS — Z9181 History of falling: Secondary | ICD-10-CM | POA: Diagnosis not present

## 2019-11-14 DIAGNOSIS — I1310 Hypertensive heart and chronic kidney disease without heart failure, with stage 1 through stage 4 chronic kidney disease, or unspecified chronic kidney disease: Secondary | ICD-10-CM | POA: Diagnosis not present

## 2019-11-14 DIAGNOSIS — S0522XD Ocular laceration and rupture with prolapse or loss of intraocular tissue, left eye, subsequent encounter: Secondary | ICD-10-CM | POA: Diagnosis not present

## 2019-11-14 DIAGNOSIS — N183 Chronic kidney disease, stage 3 unspecified: Secondary | ICD-10-CM | POA: Diagnosis not present

## 2019-11-14 DIAGNOSIS — H353222 Exudative age-related macular degeneration, left eye, with inactive choroidal neovascularization: Secondary | ICD-10-CM | POA: Diagnosis not present

## 2019-11-14 DIAGNOSIS — N390 Urinary tract infection, site not specified: Secondary | ICD-10-CM | POA: Diagnosis not present

## 2019-11-14 DIAGNOSIS — G4700 Insomnia, unspecified: Secondary | ICD-10-CM | POA: Diagnosis not present

## 2019-11-14 DIAGNOSIS — M47815 Spondylosis without myelopathy or radiculopathy, thoracolumbar region: Secondary | ICD-10-CM | POA: Diagnosis not present

## 2019-11-14 DIAGNOSIS — E559 Vitamin D deficiency, unspecified: Secondary | ICD-10-CM | POA: Diagnosis not present

## 2019-11-14 DIAGNOSIS — Z7902 Long term (current) use of antithrombotics/antiplatelets: Secondary | ICD-10-CM | POA: Diagnosis not present

## 2019-11-14 DIAGNOSIS — Z96652 Presence of left artificial knee joint: Secondary | ICD-10-CM | POA: Diagnosis not present

## 2019-11-14 DIAGNOSIS — H348311 Tributary (branch) retinal vein occlusion, right eye, with retinal neovascularization: Secondary | ICD-10-CM | POA: Diagnosis not present

## 2019-11-14 DIAGNOSIS — Z8673 Personal history of transient ischemic attack (TIA), and cerebral infarction without residual deficits: Secondary | ICD-10-CM | POA: Diagnosis not present

## 2019-11-14 DIAGNOSIS — M5030 Other cervical disc degeneration, unspecified cervical region: Secondary | ICD-10-CM | POA: Diagnosis not present

## 2019-11-14 DIAGNOSIS — I700 Atherosclerosis of aorta: Secondary | ICD-10-CM | POA: Diagnosis not present

## 2019-11-14 DIAGNOSIS — J984 Other disorders of lung: Secondary | ICD-10-CM | POA: Diagnosis not present

## 2019-11-14 DIAGNOSIS — M6281 Muscle weakness (generalized): Secondary | ICD-10-CM | POA: Diagnosis not present

## 2019-11-14 DIAGNOSIS — E782 Mixed hyperlipidemia: Secondary | ICD-10-CM | POA: Diagnosis not present

## 2019-11-14 DIAGNOSIS — W19XXXD Unspecified fall, subsequent encounter: Secondary | ICD-10-CM | POA: Diagnosis not present

## 2019-11-14 DIAGNOSIS — Z9849 Cataract extraction status, unspecified eye: Secondary | ICD-10-CM | POA: Diagnosis not present

## 2019-11-14 DIAGNOSIS — B9562 Methicillin resistant Staphylococcus aureus infection as the cause of diseases classified elsewhere: Secondary | ICD-10-CM | POA: Diagnosis not present

## 2019-11-19 DIAGNOSIS — I951 Orthostatic hypotension: Secondary | ICD-10-CM | POA: Diagnosis not present

## 2019-11-19 DIAGNOSIS — M6282 Rhabdomyolysis: Secondary | ICD-10-CM | POA: Diagnosis not present

## 2019-11-19 DIAGNOSIS — E559 Vitamin D deficiency, unspecified: Secondary | ICD-10-CM | POA: Diagnosis not present

## 2019-11-19 DIAGNOSIS — Z8673 Personal history of transient ischemic attack (TIA), and cerebral infarction without residual deficits: Secondary | ICD-10-CM | POA: Diagnosis not present

## 2019-11-19 DIAGNOSIS — N183 Chronic kidney disease, stage 3 unspecified: Secondary | ICD-10-CM | POA: Diagnosis not present

## 2019-11-19 DIAGNOSIS — E538 Deficiency of other specified B group vitamins: Secondary | ICD-10-CM | POA: Diagnosis not present

## 2019-11-19 DIAGNOSIS — G4700 Insomnia, unspecified: Secondary | ICD-10-CM | POA: Diagnosis not present

## 2019-11-19 DIAGNOSIS — Z9181 History of falling: Secondary | ICD-10-CM | POA: Diagnosis not present

## 2019-11-19 DIAGNOSIS — J984 Other disorders of lung: Secondary | ICD-10-CM | POA: Diagnosis not present

## 2019-11-19 DIAGNOSIS — M5030 Other cervical disc degeneration, unspecified cervical region: Secondary | ICD-10-CM | POA: Diagnosis not present

## 2019-11-19 DIAGNOSIS — Z87440 Personal history of urinary (tract) infections: Secondary | ICD-10-CM | POA: Diagnosis not present

## 2019-11-19 DIAGNOSIS — E782 Mixed hyperlipidemia: Secondary | ICD-10-CM | POA: Diagnosis not present

## 2019-11-19 DIAGNOSIS — B9562 Methicillin resistant Staphylococcus aureus infection as the cause of diseases classified elsewhere: Secondary | ICD-10-CM | POA: Diagnosis not present

## 2019-11-19 DIAGNOSIS — M6281 Muscle weakness (generalized): Secondary | ICD-10-CM | POA: Diagnosis not present

## 2019-11-19 DIAGNOSIS — W19XXXD Unspecified fall, subsequent encounter: Secondary | ICD-10-CM | POA: Diagnosis not present

## 2019-11-19 DIAGNOSIS — I700 Atherosclerosis of aorta: Secondary | ICD-10-CM | POA: Diagnosis not present

## 2019-11-19 DIAGNOSIS — D631 Anemia in chronic kidney disease: Secondary | ICD-10-CM | POA: Diagnosis not present

## 2019-11-19 DIAGNOSIS — M47815 Spondylosis without myelopathy or radiculopathy, thoracolumbar region: Secondary | ICD-10-CM | POA: Diagnosis not present

## 2019-11-19 DIAGNOSIS — K760 Fatty (change of) liver, not elsewhere classified: Secondary | ICD-10-CM | POA: Diagnosis not present

## 2019-11-19 DIAGNOSIS — H353222 Exudative age-related macular degeneration, left eye, with inactive choroidal neovascularization: Secondary | ICD-10-CM | POA: Diagnosis not present

## 2019-11-19 DIAGNOSIS — R55 Syncope and collapse: Secondary | ICD-10-CM | POA: Diagnosis not present

## 2019-11-19 DIAGNOSIS — N390 Urinary tract infection, site not specified: Secondary | ICD-10-CM | POA: Diagnosis not present

## 2019-11-19 DIAGNOSIS — Z96652 Presence of left artificial knee joint: Secondary | ICD-10-CM | POA: Diagnosis not present

## 2019-11-19 DIAGNOSIS — H348311 Tributary (branch) retinal vein occlusion, right eye, with retinal neovascularization: Secondary | ICD-10-CM | POA: Diagnosis not present

## 2019-11-19 DIAGNOSIS — I1310 Hypertensive heart and chronic kidney disease without heart failure, with stage 1 through stage 4 chronic kidney disease, or unspecified chronic kidney disease: Secondary | ICD-10-CM | POA: Diagnosis not present

## 2019-11-19 DIAGNOSIS — Z9849 Cataract extraction status, unspecified eye: Secondary | ICD-10-CM | POA: Diagnosis not present

## 2019-11-19 DIAGNOSIS — S0522XD Ocular laceration and rupture with prolapse or loss of intraocular tissue, left eye, subsequent encounter: Secondary | ICD-10-CM | POA: Diagnosis not present

## 2019-11-25 ENCOUNTER — Other Ambulatory Visit: Payer: Self-pay

## 2019-11-25 ENCOUNTER — Encounter: Payer: Self-pay | Admitting: Family

## 2019-11-25 ENCOUNTER — Ambulatory Visit (INDEPENDENT_AMBULATORY_CARE_PROVIDER_SITE_OTHER): Payer: Medicare Other | Admitting: Family

## 2019-11-25 VITALS — BP 166/90 | HR 78 | Temp 97.7°F | Resp 16 | Ht 67.0 in | Wt 138.6 lb

## 2019-11-25 DIAGNOSIS — S51812A Laceration without foreign body of left forearm, initial encounter: Secondary | ICD-10-CM

## 2019-11-25 DIAGNOSIS — W19XXXA Unspecified fall, initial encounter: Secondary | ICD-10-CM | POA: Diagnosis not present

## 2019-11-25 DIAGNOSIS — Y92129 Unspecified place in nursing home as the place of occurrence of the external cause: Secondary | ICD-10-CM | POA: Diagnosis not present

## 2019-11-25 DIAGNOSIS — S80212A Abrasion, left knee, initial encounter: Secondary | ICD-10-CM | POA: Diagnosis not present

## 2019-11-25 DIAGNOSIS — R2681 Unsteadiness on feet: Secondary | ICD-10-CM | POA: Diagnosis not present

## 2019-11-25 LAB — CBC WITH DIFFERENTIAL/PLATELET
Absolute Monocytes: 416 cells/uL (ref 200–950)
Basophils Absolute: 50 cells/uL (ref 0–200)
Basophils Relative: 0.8 %
Eosinophils Absolute: 69 cells/uL (ref 15–500)
Eosinophils Relative: 1.1 %
HCT: 33.8 % — ABNORMAL LOW (ref 35.0–45.0)
Hemoglobin: 11.2 g/dL — ABNORMAL LOW (ref 11.7–15.5)
Lymphs Abs: 1140 cells/uL (ref 850–3900)
MCH: 31 pg (ref 27.0–33.0)
MCHC: 33.1 g/dL (ref 32.0–36.0)
MCV: 93.6 fL (ref 80.0–100.0)
MPV: 10.7 fL (ref 7.5–12.5)
Monocytes Relative: 6.6 %
Neutro Abs: 4624 cells/uL (ref 1500–7800)
Neutrophils Relative %: 73.4 %
Platelets: 207 10*3/uL (ref 140–400)
RBC: 3.61 10*6/uL — ABNORMAL LOW (ref 3.80–5.10)
RDW: 12.4 % (ref 11.0–15.0)
Total Lymphocyte: 18.1 %
WBC: 6.3 10*3/uL (ref 3.8–10.8)

## 2019-11-25 LAB — COMPLETE METABOLIC PANEL WITH GFR
AG Ratio: 1.7 (calc) (ref 1.0–2.5)
ALT: 11 U/L (ref 6–29)
AST: 14 U/L (ref 10–35)
Albumin: 3.5 g/dL — ABNORMAL LOW (ref 3.6–5.1)
Alkaline phosphatase (APISO): 92 U/L (ref 37–153)
BUN/Creatinine Ratio: 18 (calc) (ref 6–22)
BUN: 24 mg/dL (ref 7–25)
CO2: 27 mmol/L (ref 20–32)
Calcium: 8.9 mg/dL (ref 8.6–10.4)
Chloride: 105 mmol/L (ref 98–110)
Creat: 1.32 mg/dL — ABNORMAL HIGH (ref 0.60–0.88)
GFR, Est African American: 39 mL/min/{1.73_m2} — ABNORMAL LOW (ref >=60)
GFR, Est Non African American: 34 mL/min/{1.73_m2} — ABNORMAL LOW (ref >=60)
Globulin: 2.1 g/dL (calc) (ref 1.9–3.7)
Glucose, Bld: 129 mg/dL (ref 65–139)
Potassium: 4 mmol/L (ref 3.5–5.3)
Sodium: 141 mmol/L (ref 135–146)
Total Bilirubin: 0.9 mg/dL (ref 0.2–1.2)
Total Protein: 5.6 g/dL — ABNORMAL LOW (ref 6.1–8.1)

## 2019-11-25 NOTE — Progress Notes (Signed)
Provider: Dinah Ngetich FNP-C  Gayland Curry, DO  Patient Care Team: Gayland Curry, DO as PCP - General (Geriatric Medicine) Sydnee Cabal, MD as Consulting Physician (Orthopedic Surgery) Rosalin Hawking, MD as Consulting Physician (Neurology) Warden Fillers, MD as Consulting Physician (Ophthalmology) Zadie Rhine Clent Demark, MD as Consulting Physician (Ophthalmology) Gayland Curry, DO (Geriatric Medicine)  Extended Emergency Contact Information Primary Emergency Contact: Smith,Kathy Address: [address] [place], [place] [address] Johnnette Litter of [place] Phone: [phone] Mobile Phone: [phone] Relation: Daughter Secondary Emergency Contact: Festus Holts, [place] of [place] Phone: [phone] Relation: Friend  Code Status:  DNR Goals of care: Advanced Directive information Advanced Directives 11/25/2019  Does Patient Have a Medical Advance Directive? Yes  Type of Paramedic of [place];Living will;Out of facility DNR (pink MOST or yellow form)  Does patient want to make changes to medical advance directive? No - Patient declined  Copy of [place] in Chart? Yes - validated most recent copy scanned in chart (See row information)  Would patient like information on creating a medical advance directive? -  Pre-existing out of facility DNR order (yellow form or pink MOST form) -     Chief Complaint  Patient presents with   Acute Visit    Patient fell and has a wound that wont stop bleeding. Patient is taking a blood thinner.    HPI:  Pt is a 86 y.o. female seen today for an acute visit for evaluation of fall and wound that wont stop bleeding.she is on blood thinner.she states was getting up from the chair in the dinning room on 11/24/2019 held onto the chair after she stood up but the chair flipped over.thinks she put too much weight on the chair.she fell on her left knee which started  bleeding.also sustained a skin tear on her left forearm which she put the flap back but was also bleeding.Nurse applied dressings.she states left knee has been bleeding since she is on a Plavix. Facility Nurse changed dressing this morning and noticed area still bleeding and advised her to get checked out.she denies any pain on the knee and walking without any difficulties at her baseline. She denies hitting her head or loss of consciousness.Her POA present during visit states patient fell on Thursday Previous week too while at home sustained a left knee carpet rug burn on the knee. She states has chronic UTI following up with Urologist.she is on low dose antibiotics.she states does not think she has a UTI " I know when i've one". She has been working with Physical Therapy in the facility had one more day to complete therapy.she resides in [facility] in the assisted Living facility.     Past Medical History:  Diagnosis Date   Cervicalgia    Chronic kidney disease    Chronic kidney disease, stage III (moderate)    Dermatophytosis of groin and perianal area    Dyspnea    Dysrhythmia    Hyperlipidemia LDL goal < 100    Insomnia, unspecified    Muscle weakness (generalized)    Orthostatic hypotension    Osteoarthrosis, unspecified whether generalized or localized, lower leg    osteoarthritis - upper thoracic area, left knee.   Other abnormal glucose    Other B-complex deficiencies    Other malaise and fatigue    Palpitations    Senile  cataract, unspecified    Shortness of breath    Stroke ([place])    small "stroke" - right sided weakness for a day.   Stroke ([place])    Tachycardia, unspecified    Undiagnosed cardiac murmurs    Unspecified essential hypertension    Unspecified vitamin D deficiency    Urinary tract infection    Past Surgical History:  Procedure Laterality Date   CATARACT EXTRACTION W/PHACO Bilateral 1994   Dr. Katy Fitch   CATARACT EXTRACTION,  BILATERAL Bilateral    EYE SURGERY     cataract   RUPTURED GLOBE EXPLORATION AND REPAIR Left 09/03/2019   Procedure: Scleral laceration repair LEFT EYE, exam under anesthesia LEFT EYE;  Surgeon: Debbra Riding, MD;  Location: [facility];  Service: Ophthalmology;  Laterality: Left;   TOE AMPUTATION  2002   TONSILLECTOMY  1944   TONSILLECTOMY     younger yrs   TOTAL KNEE ARTHROPLASTY Left 01/15/2015   Procedure: LEFT TOTAL KNEE ARTHROPLASTY;  Surgeon: Sydnee Cabal, MD;  Location: [facility];  Service: Orthopedics;  Laterality: Left;   TUBAL LIGATION  1951    Allergies  Allergen Reactions   Ace Inhibitors Cough   Lisinopril Cough   Statins Other (See Comments)    Body aches and pains    Outpatient Encounter Medications as of 11/25/2019  Medication Sig   Acetaminophen 500 MG capsule Take 2 capsules (1,000 mg total) by mouth at bedtime.   amoxicillin (AMOXIL) 500 MG capsule Take 2,000 mg by mouth See admin instructions. Take 2,000 mg by mouth one hour prior to dental procedures   b complex vitamins tablet Take 1 tablet by mouth daily.   carvedilol (COREG) 6.25 MG tablet Take 6.25 mg by mouth 2 (two) times daily.   Cholecalciferol (VITAMIN D3) 10 MCG (400 UNIT) CAPS Take 400 Units by mouth daily.   clobetasol cream (TEMOVATE) 4.13 % Apply 1 application topically 2 (two) times daily.   clopidogrel (PLAVIX) 75 MG tablet Take 75 mg by mouth daily.   conjugated estrogens (PREMARIN) vaginal cream Place 1 Applicatorful vaginally daily.   Cyanocobalamin (B-12 PO) Take 1 tablet by mouth daily.   Multiple Vitamins-Minerals (PRESERVISION AREDS 2+MULTI VIT) CAPS Take 1 capsule by mouth 2 (two) times daily.    nitrofurantoin (MACRODANTIN) 50 MG capsule Take 50 mg by mouth at bedtime.   ondansetron (ZOFRAN) 4 MG tablet Take 4 mg by mouth.   polyethylene glycol (MIRALAX / GLYCOLAX) 17 g packet Take 17 g by mouth as needed.   pyridoxine (B-6) 100 MG tablet Take 100 mg by mouth as  needed. For burning upon urination.   sertraline (ZOLOFT) 25 MG tablet Take 1 tablet (25 mg total) by mouth daily. For anxiety and depression   traMADol (ULTRAM) 50 MG tablet Take 1 tablet (50 mg total) by mouth 2 (two) times daily as needed for moderate pain (for pain).   traZODone (DESYREL) 50 MG tablet Take 0.5 tablets (25 mg total) by mouth at bedtime.   VITAMIN D PO Take 1 tablet by mouth daily.   No facility-administered encounter medications on file as of 11/25/2019.    Review of Systems  Constitutional: Negative for appetite change, chills, fatigue and fever.  HENT: Negative for congestion, rhinorrhea, sinus pressure, sinus pain, sneezing, sore throat and trouble swallowing.   Respiratory: Negative for cough, chest tightness, shortness of breath and wheezing.   Cardiovascular: Negative for chest pain, palpitations and leg swelling.  Gastrointestinal: Negative for abdominal distention, abdominal pain, constipation, diarrhea,  nausea and vomiting.  Endocrine: Negative for cold intolerance, heat intolerance, polydipsia, polyphagia and polyuria.  Genitourinary: Positive for frequency. Negative for difficulty urinating, dysuria, flank pain and urgency.  Musculoskeletal: Positive for gait problem. Negative for arthralgias and joint swelling.  Skin: Negative for color change, pallor and rash.  Neurological: Negative for dizziness, speech difficulty, weakness, light-headedness, numbness and headaches.  Hematological: Does not bruise/bleed easily.  Psychiatric/Behavioral: Negative for agitation, confusion and sleep disturbance. The patient is not nervous/anxious.     Immunization History  Administered Date(s) Administered   Fluad Quad(high Dose 65+) 12/26/2018   Influenza Split 11/18/2008, 12/28/2009   Influenza Whole 12/24/2012   Influenza, High Dose Seasonal PF 12/03/2017   Influenza,inj,Quad PF,6+ Mos 12/21/2016   Influenza-Unspecified 01/13/2014, 12/19/2014, 01/15/2016    PFIZER SARS-COV-2 Vaccination 05/22/2019, 06/18/2019   PPD Test 01/18/2015   Pneumococcal Conjugate-13 01/12/2014   Pneumococcal Polysaccharide-23 05/22/2011   Tdap 05/22/2011   Zoster 12/20/2010   Pertinent  Health Maintenance Due  Topic Date Due   INFLUENZA VACCINE  10/19/2019   PNA vac Low Risk Adult  Completed   DEXA SCAN  Addressed   Fall Risk  11/25/2019 10/05/2019 09/25/2019 07/16/2019 07/08/2019  Falls in the past year? _0 0 0  Number falls in past yr: _1 - 0  Injury with Fall? _2 - 0  Risk for fall due to : - History of fall(s);Impaired balance/gait;Impaired mobility;Impaired vision;Medication side effect - - -  Follow up - Falls evaluation completed;Education provided;Falls prevention discussed;Follow up appointment - - -      Vitals:   11/25/19 1307  BP: (!) 166/90  Pulse: 78  Resp: 16  Temp: 97.7 F (36.5 C)  SpO2: 96%  Weight: 138 lb 9.6 oz (62.9 kg)  Height: 5' 7" (1.702 m)   Body mass index is 21.71 kg/m. Physical Exam Vitals reviewed.  Constitutional:      General: She is not in acute distress.    Appearance: She is not ill-appearing.  HENT:     Head: Normocephalic.  Eyes:     General: No scleral icterus.       Right eye: No discharge.        Left eye: No discharge.     Conjunctiva/sclera: Conjunctivae normal.     Pupils: Pupils are equal, round, and reactive to light.  Neck:     Vascular: No carotid bruit.  Cardiovascular:     Rate and Rhythm: Normal rate and regular rhythm.     Pulses: Normal pulses.     Heart sounds: Normal heart sounds. No murmur heard.  No friction rub. No gallop.   Pulmonary:     Effort: Pulmonary effort is normal. No respiratory distress.     Breath sounds: Normal breath sounds. No wheezing, rhonchi or rales.  Chest:     Chest wall: No tenderness.  Abdominal:     General: Bowel sounds are normal. There is no distension.     Palpations: Abdomen is soft. There is no mass.     Tenderness: There is no  abdominal tenderness. There is no right CVA tenderness, left CVA tenderness, guarding or rebound.  Musculoskeletal:        General: No swelling or tenderness.     Cervical back: Normal range of motion. No rigidity or tenderness.     Right lower leg: No edema.     Left lower leg: No edema.     Comments: Unsteady gait walks with a Rolator.bilateral knee  old surgical incision noted.   Lymphadenopathy:     Cervical: No cervical adenopathy.  Skin:    General: Skin is warm.     Coloration: Skin is not pale.     Findings: No erythema or rash.     Comments: 1. Left forearm skin tear with flap well approximated on 95 % area except small area not covered by skin flap.No drainage noted.surrounding area without any signs of infection.cleansed with saline,pat dry and steri strips applied on edges then covered with non adhesive gauze and secured with Kerlix and coban.   2. Left knee skin abrasion with lateral area small bleeding area cauterized and bleeding stopped.patient tolerated cauterization well. Large amount of blood noted on old ABD dressing.cleansed with saline,pat dry prior to cauterization.Covered with 4 x 4 Gauze and secured with Kerlix and coban.   Neurological:     Mental Status: She is alert and oriented to person, place, and time.     Cranial Nerves: No cranial nerve deficit.     Sensory: No sensory deficit.     Motor: No weakness.     Gait: Gait abnormal.  Psychiatric:        Mood and Affect: Mood normal.        Behavior: Behavior normal.        Thought Content: Thought content normal.        Judgment: Judgment normal.     Labs reviewed: Recent Labs    09/03/19 1400 09/04/19 0156 09/05/19 0242  NA 134* 135 134*  K 4.1 4.5 3.9  CL 101 102 103  CO2 _0 GLUCOSE 197* 160* 118*  BUN 25* 19 23  CREATININE 1.29* 0.90 1.14*  CALCIUM 8.5* 7.9* 8.1*  MG  --   --  1.8   Recent Labs    01/04/19 1355 01/04/19 1355 06/05/19 0901 09/03/19 1400 09/05/19 0242  AST 21   <  > 12 45* 17  ALT 32   < > 10 202* 94*  ALKPHOS 100  --   --  162* 106  BILITOT 0.9   < > 0.6 1.4* 0.9  PROT 7.1   < > 6.4 5.9* 4.9*  ALBUMIN 4.1  --   --  3.4* 2.7*   < > = values in this interval not displayed.   Recent Labs    01/04/19 1355 01/04/19 1355 06/05/19 0901 07/15/19 1335 09/03/19 1400 09/04/19 0156 09/05/19 0242  WBC 11.8*   < > 6.3   < > 5.9 5.0 5.3  NEUTROABS 9.5*  --  4,278  --  4.5  --   --   HGB 13.3   < > 13.2   < > 11.0* 10.6* 10.2*  HCT 41.7   < > 40.2   < > 34.9* 33.2* 32.0*  MCV 95.4   < > 91.6   < > 97.2 94.6 95.2  PLT 276   < > 277   < > 217 202 209   < > = values in this interval not displayed.   Lab Results  Component Value Date   TSH 2.44 06/05/2019   Lab Results  Component Value Date   HGBA1C 5.7 (H) 12/26/2018   Lab Results  Component Value Date   CHOL 205 (H) 04/17/2016   HDL 26 (L) 04/17/2016   LDLCALC 128 (H) 04/17/2016   TRIG 254 (H) 04/17/2016   CHOLHDL 7.9 (H) 04/17/2016    Significant Diagnostic Results in last 30 days:  Intravitreal Injection, Pharmacologic  Agent - OD - Right Eye  Result Date: 10/27/2019 Time Out 10/27/2019. 10:18 AM. Confirmed correct patient, procedure, site, and patient consented. Anesthesia Topical anesthesia was used. Anesthetic medications included Akten 3.5%. Procedure Preparation included Tobramycin 0.3%, 10% betadine to eyelids, 5% betadine to ocular surface. A 30 gauge needle was used. Injection: 1.25 mg Bevacizumab (AVASTIN) SOLN   NDC: [address]-0, Lot: [id number]   Route: Intravitreal, Site: Right Eye, Waste: 0 mg Post-op Post injection exam found visual acuity of at least counting fingers. The patient tolerated the procedure well. There were no complications. The patient received written and verbal post procedure care education. Post injection medications were not given.   OCT, Retina - OU - Both Eyes  Result Date: 10/27/2019 Right Eye Quality was good. Scan locations included subfoveal. Central Foveal  Thickness: 275. Progression has been stable. Findings include cystoid macular edema. Left Eye Quality was good. Scan locations included subfoveal. Central Foveal Thickness: 380. Findings include subretinal hyper-reflective material, retinal drusen , no SRF, no IRF. Notes Region of CME from superior branch retinal vein occlusion OD , Minor at 6-week interval post intravitreal Avastin OS with large subfoveal drusenoid debris deposit unchanged over the last 9 to 12 months   Assessment/Plan 1. Fall at nursing home, initial encounter Status post fall from holding a chair in the dinning room chair flipped and ended up on the floor on left knee sustained abrasion and skin tear on left forearm.she did not hit her head or loss consciousness.  - decline urine to be checked doesn't think it's UTI related follows up with urologist and knows when she has a UTI will obtain labs to rule out acute abnormalities and metabolic etiologies.  - CBC with Differential/Platelet - CMP with eGFR([facility])  2. Unsteady gait -continue to use Rolator  - continue with PT/OT for ROM,exercise,gait stability and muscle strengthening. Orders written faxed to facility [facility] original copy given to patient.  - CBC with Differential/Platelet - CMP with eGFR([facility])  3. Skin tear of left forearm without complication, initial encounter Left forearm skin tear with flap well approximated on 95 % area except small area not covered by skin flap.No drainage noted.surrounding area without any signs of infection.cleansed with saline,pat dry and steri strips applied on edges then covered with non adhesive gauze and secured with Kerlix and coban. - CBC with Differential/Platelet - For home use only DME Other see comment: - orders faxed to facility as above. Facility Nurse to change  left forearm skin tear,cover with non-adhensive gauze and secure with Kerlix and coban.change dressing every other day and as needed if soiled.Monitor for signs  and symptoms of infections.   4. Abrasion of left knee, initial encounter  Left knee skin abrasion with lateral area small bleeding area cauterized and bleeding stopped.patient tolerated cauterization well. Large amount of blood noted on old ABD dressing.cleansed with saline,pat dry prior to cauterization.Covered with 4 x 4 Gauze and secured with Kerlix and coban.  - CBC with Differential/Platelet - For home use only DME Other see comment:Cleanse left knee abrasion with saline,pat dry,cover with  4 X  4 gauze and secure with Kerlix and coban.changed dressing every other day and as needed if soiled.Monitor for signs of infections.  - Hold Plavix toady and 11/26/2019 then restart 11/27/2019   Family/ staff Communication: Reviewed plan of care with patient and POA verbalized understanding.   Labs/tests ordered:  - CBC with Differential/Platelet - CMP with eGFR([facility]) Next Appointment: As needed if symptoms worsen or fail  to improve   Sandrea Hughs, NP

## 2019-11-25 NOTE — Patient Instructions (Addendum)
-   Facility Nurse to change  left forearm skin tear,cover with non-adhensive gauze and secure with Kerlix and coban.change dressing every other day and as needed if soiled.Monitor for signs and symptoms of infections.   - Cleanse left knee abrasion with saline,pat dry,cover with  4 X  4 gauze and secure with Kerlix and coban.changed dressing every other day and as needed if soiled.Monitor for signs of infections.   - Hold Plavix toady and 11/26/2019 then restart 11/27/2019   - continue with PT/OT for ROM,exercise,gait stability and muscle strengthening

## 2019-11-26 ENCOUNTER — Telehealth: Payer: Self-pay | Admitting: *Deleted

## 2019-11-26 ENCOUNTER — Telehealth: Payer: Self-pay

## 2019-11-26 DIAGNOSIS — G4700 Insomnia, unspecified: Secondary | ICD-10-CM | POA: Diagnosis not present

## 2019-11-26 DIAGNOSIS — Z9849 Cataract extraction status, unspecified eye: Secondary | ICD-10-CM | POA: Diagnosis not present

## 2019-11-26 DIAGNOSIS — M6281 Muscle weakness (generalized): Secondary | ICD-10-CM | POA: Diagnosis not present

## 2019-11-26 DIAGNOSIS — I951 Orthostatic hypotension: Secondary | ICD-10-CM | POA: Diagnosis not present

## 2019-11-26 DIAGNOSIS — B9562 Methicillin resistant Staphylococcus aureus infection as the cause of diseases classified elsewhere: Secondary | ICD-10-CM | POA: Diagnosis not present

## 2019-11-26 DIAGNOSIS — S0522XD Ocular laceration and rupture with prolapse or loss of intraocular tissue, left eye, subsequent encounter: Secondary | ICD-10-CM | POA: Diagnosis not present

## 2019-11-26 DIAGNOSIS — R2681 Unsteadiness on feet: Secondary | ICD-10-CM

## 2019-11-26 DIAGNOSIS — E782 Mixed hyperlipidemia: Secondary | ICD-10-CM | POA: Diagnosis not present

## 2019-11-26 DIAGNOSIS — I1310 Hypertensive heart and chronic kidney disease without heart failure, with stage 1 through stage 4 chronic kidney disease, or unspecified chronic kidney disease: Secondary | ICD-10-CM | POA: Diagnosis not present

## 2019-11-26 DIAGNOSIS — I700 Atherosclerosis of aorta: Secondary | ICD-10-CM | POA: Diagnosis not present

## 2019-11-26 DIAGNOSIS — M47815 Spondylosis without myelopathy or radiculopathy, thoracolumbar region: Secondary | ICD-10-CM | POA: Diagnosis not present

## 2019-11-26 DIAGNOSIS — W19XXXD Unspecified fall, subsequent encounter: Secondary | ICD-10-CM | POA: Diagnosis not present

## 2019-11-26 DIAGNOSIS — E538 Deficiency of other specified B group vitamins: Secondary | ICD-10-CM | POA: Diagnosis not present

## 2019-11-26 DIAGNOSIS — H348311 Tributary (branch) retinal vein occlusion, right eye, with retinal neovascularization: Secondary | ICD-10-CM | POA: Diagnosis not present

## 2019-11-26 DIAGNOSIS — H353222 Exudative age-related macular degeneration, left eye, with inactive choroidal neovascularization: Secondary | ICD-10-CM | POA: Diagnosis not present

## 2019-11-26 DIAGNOSIS — W19XXXA Unspecified fall, initial encounter: Secondary | ICD-10-CM

## 2019-11-26 DIAGNOSIS — D631 Anemia in chronic kidney disease: Secondary | ICD-10-CM | POA: Diagnosis not present

## 2019-11-26 DIAGNOSIS — Z96652 Presence of left artificial knee joint: Secondary | ICD-10-CM | POA: Diagnosis not present

## 2019-11-26 DIAGNOSIS — Z8673 Personal history of transient ischemic attack (TIA), and cerebral infarction without residual deficits: Secondary | ICD-10-CM | POA: Diagnosis not present

## 2019-11-26 DIAGNOSIS — N390 Urinary tract infection, site not specified: Secondary | ICD-10-CM | POA: Diagnosis not present

## 2019-11-26 DIAGNOSIS — Z9181 History of falling: Secondary | ICD-10-CM | POA: Diagnosis not present

## 2019-11-26 DIAGNOSIS — E559 Vitamin D deficiency, unspecified: Secondary | ICD-10-CM | POA: Diagnosis not present

## 2019-11-26 DIAGNOSIS — J984 Other disorders of lung: Secondary | ICD-10-CM | POA: Diagnosis not present

## 2019-11-26 DIAGNOSIS — N183 Chronic kidney disease, stage 3 unspecified: Secondary | ICD-10-CM | POA: Diagnosis not present

## 2019-11-26 DIAGNOSIS — M5030 Other cervical disc degeneration, unspecified cervical region: Secondary | ICD-10-CM | POA: Diagnosis not present

## 2019-11-26 DIAGNOSIS — Z87440 Personal history of urinary (tract) infections: Secondary | ICD-10-CM | POA: Diagnosis not present

## 2019-11-26 DIAGNOSIS — K760 Fatty (change of) liver, not elsewhere classified: Secondary | ICD-10-CM | POA: Diagnosis not present

## 2019-11-26 NOTE — Telephone Encounter (Signed)
Jeral Pinch from [facility] states that this patient needs new orders for home health to come into facility and assist skin tears. Jeral Pinch also states that Plavix order wasn't on hold for 11/26/2019 and restarted on 11/27/2019. Nurse wants to know if Plavix should be held tomorrow 11/27/2019 and given on 9/10/20021. She states that neither her or another nurse was there to tend to these orders. She states to fax orders to number [phone] and contact back phone number is [phone]. Please Advise.

## 2019-11-26 NOTE — Telephone Encounter (Signed)
Lisa [name] with [facility] called requesting verbal orders to recert patient and see 2x4wks due to recent falls. Stated that they were going to discharge today but with having recent falls they are going to re certify.  Verbal orders given.

## 2019-11-26 NOTE — Telephone Encounter (Signed)
Attempted to call back to facility to address this when I finally got a chance to read it at 5:44pm.  Phone rings and rings and then it's like someone picks up but no one is there and it's dead silent.  Happened 2 times in a row.   I wanted to find out if she's still having considerable bleeding from her skin tears.  If not, ok to continue plavix as usual since holding it increases stroke risk.

## 2019-11-27 NOTE — Telephone Encounter (Signed)
Raeanne Gathers called to check the status of her message for she had not heard back. I shared Dr.Reed's response and [place] asked that I fax this encounter to her for documentation purposes. Raeanne Gathers states patient is not having bleeding at this time.   Raeanne Gathers asked that Dr.Reed place an order for Home Health- Skilled Nursing.   Raeanne Gathers would like that order faxed as well.  I will await Dr.Reed's review and completion of Home Health referral prior to faxing all requested items  Fax number: [phone]  Phone number for Raeanne Gathers is [phone] (work cell phone)

## 2019-11-27 NOTE — Telephone Encounter (Signed)
Telephone encounter and referral order faxed as requested. If Chip Boer needs more information regarding a particular agency they will contact us.

## 2019-11-27 NOTE — Telephone Encounter (Signed)
Home health PT and RN orders added.  Is there a particular agency needed to go out to [facility]? That may be needed for the referral coordinator to address.

## 2019-11-30 DIAGNOSIS — I951 Orthostatic hypotension: Secondary | ICD-10-CM

## 2019-11-30 DIAGNOSIS — S0522XD Ocular laceration and rupture with prolapse or loss of intraocular tissue, left eye, subsequent encounter: Secondary | ICD-10-CM | POA: Diagnosis not present

## 2019-11-30 DIAGNOSIS — I1310 Hypertensive heart and chronic kidney disease without heart failure, with stage 1 through stage 4 chronic kidney disease, or unspecified chronic kidney disease: Secondary | ICD-10-CM

## 2019-11-30 DIAGNOSIS — N390 Urinary tract infection, site not specified: Secondary | ICD-10-CM | POA: Diagnosis not present

## 2019-11-30 DIAGNOSIS — N183 Chronic kidney disease, stage 3 unspecified: Secondary | ICD-10-CM

## 2019-11-30 DIAGNOSIS — B9562 Methicillin resistant Staphylococcus aureus infection as the cause of diseases classified elsewhere: Secondary | ICD-10-CM | POA: Diagnosis not present

## 2019-11-30 DIAGNOSIS — D631 Anemia in chronic kidney disease: Secondary | ICD-10-CM

## 2019-11-30 DIAGNOSIS — W19XXXD Unspecified fall, subsequent encounter: Secondary | ICD-10-CM | POA: Diagnosis not present

## 2019-12-02 DIAGNOSIS — Z96652 Presence of left artificial knee joint: Secondary | ICD-10-CM | POA: Diagnosis not present

## 2019-12-02 DIAGNOSIS — H348311 Tributary (branch) retinal vein occlusion, right eye, with retinal neovascularization: Secondary | ICD-10-CM | POA: Diagnosis not present

## 2019-12-02 DIAGNOSIS — J984 Other disorders of lung: Secondary | ICD-10-CM | POA: Diagnosis not present

## 2019-12-02 DIAGNOSIS — E538 Deficiency of other specified B group vitamins: Secondary | ICD-10-CM | POA: Diagnosis not present

## 2019-12-02 DIAGNOSIS — M6281 Muscle weakness (generalized): Secondary | ICD-10-CM | POA: Diagnosis not present

## 2019-12-02 DIAGNOSIS — M5030 Other cervical disc degeneration, unspecified cervical region: Secondary | ICD-10-CM | POA: Diagnosis not present

## 2019-12-02 DIAGNOSIS — E782 Mixed hyperlipidemia: Secondary | ICD-10-CM | POA: Diagnosis not present

## 2019-12-02 DIAGNOSIS — I1310 Hypertensive heart and chronic kidney disease without heart failure, with stage 1 through stage 4 chronic kidney disease, or unspecified chronic kidney disease: Secondary | ICD-10-CM | POA: Diagnosis not present

## 2019-12-02 DIAGNOSIS — H353222 Exudative age-related macular degeneration, left eye, with inactive choroidal neovascularization: Secondary | ICD-10-CM | POA: Diagnosis not present

## 2019-12-02 DIAGNOSIS — I951 Orthostatic hypotension: Secondary | ICD-10-CM | POA: Diagnosis not present

## 2019-12-02 DIAGNOSIS — D631 Anemia in chronic kidney disease: Secondary | ICD-10-CM | POA: Diagnosis not present

## 2019-12-02 DIAGNOSIS — N183 Chronic kidney disease, stage 3 unspecified: Secondary | ICD-10-CM | POA: Diagnosis not present

## 2019-12-02 DIAGNOSIS — I700 Atherosclerosis of aorta: Secondary | ICD-10-CM | POA: Diagnosis not present

## 2019-12-02 DIAGNOSIS — Z9181 History of falling: Secondary | ICD-10-CM | POA: Diagnosis not present

## 2019-12-02 DIAGNOSIS — W19XXXD Unspecified fall, subsequent encounter: Secondary | ICD-10-CM | POA: Diagnosis not present

## 2019-12-02 DIAGNOSIS — B9562 Methicillin resistant Staphylococcus aureus infection as the cause of diseases classified elsewhere: Secondary | ICD-10-CM | POA: Diagnosis not present

## 2019-12-02 DIAGNOSIS — Z87440 Personal history of urinary (tract) infections: Secondary | ICD-10-CM | POA: Diagnosis not present

## 2019-12-02 DIAGNOSIS — Z9849 Cataract extraction status, unspecified eye: Secondary | ICD-10-CM | POA: Diagnosis not present

## 2019-12-02 DIAGNOSIS — S0522XD Ocular laceration and rupture with prolapse or loss of intraocular tissue, left eye, subsequent encounter: Secondary | ICD-10-CM | POA: Diagnosis not present

## 2019-12-02 DIAGNOSIS — M47815 Spondylosis without myelopathy or radiculopathy, thoracolumbar region: Secondary | ICD-10-CM | POA: Diagnosis not present

## 2019-12-02 DIAGNOSIS — Z8673 Personal history of transient ischemic attack (TIA), and cerebral infarction without residual deficits: Secondary | ICD-10-CM | POA: Diagnosis not present

## 2019-12-02 DIAGNOSIS — G4700 Insomnia, unspecified: Secondary | ICD-10-CM | POA: Diagnosis not present

## 2019-12-02 DIAGNOSIS — N390 Urinary tract infection, site not specified: Secondary | ICD-10-CM | POA: Diagnosis not present

## 2019-12-02 DIAGNOSIS — E559 Vitamin D deficiency, unspecified: Secondary | ICD-10-CM | POA: Diagnosis not present

## 2019-12-02 DIAGNOSIS — K760 Fatty (change of) liver, not elsewhere classified: Secondary | ICD-10-CM | POA: Diagnosis not present

## 2019-12-04 DIAGNOSIS — E559 Vitamin D deficiency, unspecified: Secondary | ICD-10-CM | POA: Diagnosis not present

## 2019-12-04 DIAGNOSIS — I700 Atherosclerosis of aorta: Secondary | ICD-10-CM | POA: Diagnosis not present

## 2019-12-04 DIAGNOSIS — G4700 Insomnia, unspecified: Secondary | ICD-10-CM | POA: Diagnosis not present

## 2019-12-04 DIAGNOSIS — Z9181 History of falling: Secondary | ICD-10-CM | POA: Diagnosis not present

## 2019-12-04 DIAGNOSIS — H353222 Exudative age-related macular degeneration, left eye, with inactive choroidal neovascularization: Secondary | ICD-10-CM | POA: Diagnosis not present

## 2019-12-04 DIAGNOSIS — M5030 Other cervical disc degeneration, unspecified cervical region: Secondary | ICD-10-CM | POA: Diagnosis not present

## 2019-12-04 DIAGNOSIS — J984 Other disorders of lung: Secondary | ICD-10-CM | POA: Diagnosis not present

## 2019-12-04 DIAGNOSIS — N183 Chronic kidney disease, stage 3 unspecified: Secondary | ICD-10-CM | POA: Diagnosis not present

## 2019-12-04 DIAGNOSIS — Z87440 Personal history of urinary (tract) infections: Secondary | ICD-10-CM | POA: Diagnosis not present

## 2019-12-04 DIAGNOSIS — Z96652 Presence of left artificial knee joint: Secondary | ICD-10-CM | POA: Diagnosis not present

## 2019-12-04 DIAGNOSIS — N390 Urinary tract infection, site not specified: Secondary | ICD-10-CM | POA: Diagnosis not present

## 2019-12-04 DIAGNOSIS — I951 Orthostatic hypotension: Secondary | ICD-10-CM | POA: Diagnosis not present

## 2019-12-04 DIAGNOSIS — H348311 Tributary (branch) retinal vein occlusion, right eye, with retinal neovascularization: Secondary | ICD-10-CM | POA: Diagnosis not present

## 2019-12-04 DIAGNOSIS — Z8673 Personal history of transient ischemic attack (TIA), and cerebral infarction without residual deficits: Secondary | ICD-10-CM | POA: Diagnosis not present

## 2019-12-04 DIAGNOSIS — M6281 Muscle weakness (generalized): Secondary | ICD-10-CM | POA: Diagnosis not present

## 2019-12-04 DIAGNOSIS — S0522XD Ocular laceration and rupture with prolapse or loss of intraocular tissue, left eye, subsequent encounter: Secondary | ICD-10-CM | POA: Diagnosis not present

## 2019-12-04 DIAGNOSIS — K760 Fatty (change of) liver, not elsewhere classified: Secondary | ICD-10-CM | POA: Diagnosis not present

## 2019-12-04 DIAGNOSIS — B9562 Methicillin resistant Staphylococcus aureus infection as the cause of diseases classified elsewhere: Secondary | ICD-10-CM | POA: Diagnosis not present

## 2019-12-04 DIAGNOSIS — I1310 Hypertensive heart and chronic kidney disease without heart failure, with stage 1 through stage 4 chronic kidney disease, or unspecified chronic kidney disease: Secondary | ICD-10-CM | POA: Diagnosis not present

## 2019-12-04 DIAGNOSIS — E538 Deficiency of other specified B group vitamins: Secondary | ICD-10-CM | POA: Diagnosis not present

## 2019-12-04 DIAGNOSIS — E782 Mixed hyperlipidemia: Secondary | ICD-10-CM | POA: Diagnosis not present

## 2019-12-04 DIAGNOSIS — W19XXXD Unspecified fall, subsequent encounter: Secondary | ICD-10-CM | POA: Diagnosis not present

## 2019-12-04 DIAGNOSIS — D631 Anemia in chronic kidney disease: Secondary | ICD-10-CM | POA: Diagnosis not present

## 2019-12-04 DIAGNOSIS — Z9849 Cataract extraction status, unspecified eye: Secondary | ICD-10-CM | POA: Diagnosis not present

## 2019-12-04 DIAGNOSIS — M47815 Spondylosis without myelopathy or radiculopathy, thoracolumbar region: Secondary | ICD-10-CM | POA: Diagnosis not present

## 2019-12-09 DIAGNOSIS — N183 Chronic kidney disease, stage 3 unspecified: Secondary | ICD-10-CM | POA: Diagnosis not present

## 2019-12-09 DIAGNOSIS — Z9181 History of falling: Secondary | ICD-10-CM | POA: Diagnosis not present

## 2019-12-09 DIAGNOSIS — I1310 Hypertensive heart and chronic kidney disease without heart failure, with stage 1 through stage 4 chronic kidney disease, or unspecified chronic kidney disease: Secondary | ICD-10-CM | POA: Diagnosis not present

## 2019-12-09 DIAGNOSIS — E559 Vitamin D deficiency, unspecified: Secondary | ICD-10-CM | POA: Diagnosis not present

## 2019-12-09 DIAGNOSIS — K760 Fatty (change of) liver, not elsewhere classified: Secondary | ICD-10-CM | POA: Diagnosis not present

## 2019-12-09 DIAGNOSIS — I951 Orthostatic hypotension: Secondary | ICD-10-CM | POA: Diagnosis not present

## 2019-12-09 DIAGNOSIS — D631 Anemia in chronic kidney disease: Secondary | ICD-10-CM | POA: Diagnosis not present

## 2019-12-09 DIAGNOSIS — W19XXXD Unspecified fall, subsequent encounter: Secondary | ICD-10-CM | POA: Diagnosis not present

## 2019-12-09 DIAGNOSIS — N390 Urinary tract infection, site not specified: Secondary | ICD-10-CM | POA: Diagnosis not present

## 2019-12-09 DIAGNOSIS — Z87440 Personal history of urinary (tract) infections: Secondary | ICD-10-CM | POA: Diagnosis not present

## 2019-12-09 DIAGNOSIS — M5030 Other cervical disc degeneration, unspecified cervical region: Secondary | ICD-10-CM | POA: Diagnosis not present

## 2019-12-09 DIAGNOSIS — S0522XD Ocular laceration and rupture with prolapse or loss of intraocular tissue, left eye, subsequent encounter: Secondary | ICD-10-CM | POA: Diagnosis not present

## 2019-12-09 DIAGNOSIS — B9562 Methicillin resistant Staphylococcus aureus infection as the cause of diseases classified elsewhere: Secondary | ICD-10-CM | POA: Diagnosis not present

## 2019-12-09 DIAGNOSIS — J984 Other disorders of lung: Secondary | ICD-10-CM | POA: Diagnosis not present

## 2019-12-09 DIAGNOSIS — I700 Atherosclerosis of aorta: Secondary | ICD-10-CM | POA: Diagnosis not present

## 2019-12-09 DIAGNOSIS — E538 Deficiency of other specified B group vitamins: Secondary | ICD-10-CM | POA: Diagnosis not present

## 2019-12-09 DIAGNOSIS — G4700 Insomnia, unspecified: Secondary | ICD-10-CM | POA: Diagnosis not present

## 2019-12-09 DIAGNOSIS — H348311 Tributary (branch) retinal vein occlusion, right eye, with retinal neovascularization: Secondary | ICD-10-CM | POA: Diagnosis not present

## 2019-12-09 DIAGNOSIS — M47815 Spondylosis without myelopathy or radiculopathy, thoracolumbar region: Secondary | ICD-10-CM | POA: Diagnosis not present

## 2019-12-09 DIAGNOSIS — M6281 Muscle weakness (generalized): Secondary | ICD-10-CM | POA: Diagnosis not present

## 2019-12-09 DIAGNOSIS — H353222 Exudative age-related macular degeneration, left eye, with inactive choroidal neovascularization: Secondary | ICD-10-CM | POA: Diagnosis not present

## 2019-12-09 DIAGNOSIS — Z8673 Personal history of transient ischemic attack (TIA), and cerebral infarction without residual deficits: Secondary | ICD-10-CM | POA: Diagnosis not present

## 2019-12-09 DIAGNOSIS — E782 Mixed hyperlipidemia: Secondary | ICD-10-CM | POA: Diagnosis not present

## 2019-12-09 DIAGNOSIS — Z96652 Presence of left artificial knee joint: Secondary | ICD-10-CM | POA: Diagnosis not present

## 2019-12-09 DIAGNOSIS — Z9849 Cataract extraction status, unspecified eye: Secondary | ICD-10-CM | POA: Diagnosis not present

## 2019-12-11 DIAGNOSIS — K760 Fatty (change of) liver, not elsewhere classified: Secondary | ICD-10-CM | POA: Diagnosis not present

## 2019-12-11 DIAGNOSIS — H353222 Exudative age-related macular degeneration, left eye, with inactive choroidal neovascularization: Secondary | ICD-10-CM | POA: Diagnosis not present

## 2019-12-11 DIAGNOSIS — H348311 Tributary (branch) retinal vein occlusion, right eye, with retinal neovascularization: Secondary | ICD-10-CM | POA: Diagnosis not present

## 2019-12-11 DIAGNOSIS — Z9181 History of falling: Secondary | ICD-10-CM | POA: Diagnosis not present

## 2019-12-11 DIAGNOSIS — E782 Mixed hyperlipidemia: Secondary | ICD-10-CM | POA: Diagnosis not present

## 2019-12-11 DIAGNOSIS — I1310 Hypertensive heart and chronic kidney disease without heart failure, with stage 1 through stage 4 chronic kidney disease, or unspecified chronic kidney disease: Secondary | ICD-10-CM | POA: Diagnosis not present

## 2019-12-11 DIAGNOSIS — B9562 Methicillin resistant Staphylococcus aureus infection as the cause of diseases classified elsewhere: Secondary | ICD-10-CM | POA: Diagnosis not present

## 2019-12-11 DIAGNOSIS — M6281 Muscle weakness (generalized): Secondary | ICD-10-CM | POA: Diagnosis not present

## 2019-12-11 DIAGNOSIS — J984 Other disorders of lung: Secondary | ICD-10-CM | POA: Diagnosis not present

## 2019-12-11 DIAGNOSIS — D631 Anemia in chronic kidney disease: Secondary | ICD-10-CM | POA: Diagnosis not present

## 2019-12-11 DIAGNOSIS — W19XXXD Unspecified fall, subsequent encounter: Secondary | ICD-10-CM | POA: Diagnosis not present

## 2019-12-11 DIAGNOSIS — Z9849 Cataract extraction status, unspecified eye: Secondary | ICD-10-CM | POA: Diagnosis not present

## 2019-12-11 DIAGNOSIS — Z96652 Presence of left artificial knee joint: Secondary | ICD-10-CM | POA: Diagnosis not present

## 2019-12-11 DIAGNOSIS — I700 Atherosclerosis of aorta: Secondary | ICD-10-CM | POA: Diagnosis not present

## 2019-12-11 DIAGNOSIS — M5030 Other cervical disc degeneration, unspecified cervical region: Secondary | ICD-10-CM | POA: Diagnosis not present

## 2019-12-11 DIAGNOSIS — N390 Urinary tract infection, site not specified: Secondary | ICD-10-CM | POA: Diagnosis not present

## 2019-12-11 DIAGNOSIS — I951 Orthostatic hypotension: Secondary | ICD-10-CM | POA: Diagnosis not present

## 2019-12-11 DIAGNOSIS — E538 Deficiency of other specified B group vitamins: Secondary | ICD-10-CM | POA: Diagnosis not present

## 2019-12-11 DIAGNOSIS — M47815 Spondylosis without myelopathy or radiculopathy, thoracolumbar region: Secondary | ICD-10-CM | POA: Diagnosis not present

## 2019-12-11 DIAGNOSIS — N183 Chronic kidney disease, stage 3 unspecified: Secondary | ICD-10-CM | POA: Diagnosis not present

## 2019-12-11 DIAGNOSIS — S0522XD Ocular laceration and rupture with prolapse or loss of intraocular tissue, left eye, subsequent encounter: Secondary | ICD-10-CM | POA: Diagnosis not present

## 2019-12-11 DIAGNOSIS — E559 Vitamin D deficiency, unspecified: Secondary | ICD-10-CM | POA: Diagnosis not present

## 2019-12-11 DIAGNOSIS — G4700 Insomnia, unspecified: Secondary | ICD-10-CM | POA: Diagnosis not present

## 2019-12-11 DIAGNOSIS — Z87440 Personal history of urinary (tract) infections: Secondary | ICD-10-CM | POA: Diagnosis not present

## 2019-12-11 DIAGNOSIS — Z8673 Personal history of transient ischemic attack (TIA), and cerebral infarction without residual deficits: Secondary | ICD-10-CM | POA: Diagnosis not present

## 2019-12-15 ENCOUNTER — Encounter (INDEPENDENT_AMBULATORY_CARE_PROVIDER_SITE_OTHER): Payer: Medicare Other | Admitting: Ophthalmology

## 2019-12-16 ENCOUNTER — Emergency Department (HOSPITAL_COMMUNITY)
Admission: EM | Admit: 2019-12-16 | Discharge: 2019-12-16 | Disposition: A | Discharge status: Home / Self Care | Payer: Medicare Other | Attending: Emergency Medicine | Admitting: Emergency Medicine

## 2019-12-16 ENCOUNTER — Encounter (HOSPITAL_COMMUNITY): Payer: Self-pay | Admitting: Emergency Medicine

## 2019-12-16 ENCOUNTER — Emergency Department (HOSPITAL_COMMUNITY): Payer: Medicare Other

## 2019-12-16 DIAGNOSIS — I6782 Cerebral ischemia: Secondary | ICD-10-CM | POA: Diagnosis not present

## 2019-12-16 DIAGNOSIS — Z23 Encounter for immunization: Secondary | ICD-10-CM | POA: Insufficient documentation

## 2019-12-16 DIAGNOSIS — W050XXA Fall from non-moving wheelchair, initial encounter: Secondary | ICD-10-CM | POA: Insufficient documentation

## 2019-12-16 DIAGNOSIS — Y9389 Activity, other specified: Secondary | ICD-10-CM | POA: Diagnosis not present

## 2019-12-16 DIAGNOSIS — S199XXA Unspecified injury of neck, initial encounter: Secondary | ICD-10-CM | POA: Diagnosis not present

## 2019-12-16 DIAGNOSIS — W19XXXA Unspecified fall, initial encounter: Secondary | ICD-10-CM | POA: Diagnosis not present

## 2019-12-16 DIAGNOSIS — N3000 Acute cystitis without hematuria: Secondary | ICD-10-CM | POA: Diagnosis not present

## 2019-12-16 DIAGNOSIS — Y9289 Other specified places as the place of occurrence of the external cause: Secondary | ICD-10-CM | POA: Insufficient documentation

## 2019-12-16 DIAGNOSIS — Z96652 Presence of left artificial knee joint: Secondary | ICD-10-CM | POA: Insufficient documentation

## 2019-12-16 DIAGNOSIS — J320 Chronic maxillary sinusitis: Secondary | ICD-10-CM | POA: Diagnosis not present

## 2019-12-16 DIAGNOSIS — S61411A Laceration without foreign body of right hand, initial encounter: Secondary | ICD-10-CM | POA: Insufficient documentation

## 2019-12-16 DIAGNOSIS — I129 Hypertensive chronic kidney disease with stage 1 through stage 4 chronic kidney disease, or unspecified chronic kidney disease: Secondary | ICD-10-CM | POA: Insufficient documentation

## 2019-12-16 DIAGNOSIS — Z79899 Other long term (current) drug therapy: Secondary | ICD-10-CM | POA: Insufficient documentation

## 2019-12-16 DIAGNOSIS — R6889 Other general symptoms and signs: Secondary | ICD-10-CM | POA: Diagnosis not present

## 2019-12-16 DIAGNOSIS — S0990XA Unspecified injury of head, initial encounter: Secondary | ICD-10-CM | POA: Diagnosis present

## 2019-12-16 DIAGNOSIS — S0031XA Abrasion of nose, initial encounter: Secondary | ICD-10-CM | POA: Insufficient documentation

## 2019-12-16 DIAGNOSIS — N183 Chronic kidney disease, stage 3 unspecified: Secondary | ICD-10-CM | POA: Insufficient documentation

## 2019-12-16 DIAGNOSIS — Z743 Need for continuous supervision: Secondary | ICD-10-CM | POA: Diagnosis not present

## 2019-12-16 DIAGNOSIS — G319 Degenerative disease of nervous system, unspecified: Secondary | ICD-10-CM | POA: Diagnosis not present

## 2019-12-16 DIAGNOSIS — S0081XA Abrasion of other part of head, initial encounter: Secondary | ICD-10-CM | POA: Insufficient documentation

## 2019-12-16 DIAGNOSIS — S0003XA Contusion of scalp, initial encounter: Secondary | ICD-10-CM | POA: Diagnosis not present

## 2019-12-16 DIAGNOSIS — M542 Cervicalgia: Secondary | ICD-10-CM | POA: Diagnosis not present

## 2019-12-16 LAB — URINALYSIS, ROUTINE W REFLEX MICROSCOPIC
Bilirubin Urine: NEGATIVE
Glucose, UA: NEGATIVE mg/dL
Hgb urine dipstick: NEGATIVE
Ketones, ur: NEGATIVE mg/dL
Nitrite: NEGATIVE
Protein, ur: NEGATIVE mg/dL
Specific Gravity, Urine: 1.009 (ref 1.005–1.030)
WBC, UA: >50 WBC/hpf — ABNORMAL HIGH (ref 0–5)
pH: 7 (ref 5.0–8.0)

## 2019-12-16 MED ORDER — TETANUS-DIPHTH-ACELL PERTUSSIS 5-2.5-18.5 LF-MCG/0.5 IM SUSP
0.5000 mL | Freq: Once | INTRAMUSCULAR | Status: AC
  Administered 2019-12-16: 0.5 mL via INTRAMUSCULAR
  Filled 2019-12-16: qty 0.5

## 2019-12-16 MED ORDER — FLUCONAZOLE 150 MG PO TABS
150.0000 mg | ORAL_TABLET | Freq: Once | ORAL | Status: AC
  Administered 2019-12-16: 150 mg via ORAL
  Filled 2019-12-16: qty 1

## 2019-12-16 MED ORDER — CEPHALEXIN 500 MG PO CAPS
500.0000 mg | ORAL_CAPSULE | Freq: Three times a day (TID) | ORAL | 0 refills | Status: DC
Start: 2019-12-16 — End: 2020-01-29

## 2019-12-16 MED ORDER — CEPHALEXIN 250 MG PO CAPS
500.0000 mg | ORAL_CAPSULE | Freq: Once | ORAL | Status: AC
  Administered 2019-12-16: 500 mg via ORAL
  Filled 2019-12-16: qty 2

## 2019-12-16 NOTE — Discharge Instructions (Addendum)
You were seen in the emergency department today after a fall . Your CT scans today she had some previous degenerative findings, but no head bleed or new fracture.  Your urine sample did show that you have a UTI.  It also showed that you have some yeast present.  You were given a dose of an antifungal medicine for the yeast in the emergency department and we are sending you home with Keflex to take 3 times per day for the next 1 week to treat the urinary tract infection.  We have sent your urine for culture, we will call you if this is not the correct antibiotic.    We have prescribed you new medication(s) today. Discuss the medications prescribed today with your pharmacist as they can have adverse effects and interactions with your other medicines including over the counter and prescribed medications. Seek medical evaluation if you start to experience new or abnormal symptoms after taking one of these medicines, seek care immediately if you start to experience difficulty breathing, feeling of your throat closing, facial swelling, or rash as these could be indications of a more serious allergic reaction   Your blood pressure was also noted to be elevated in the emergency department today, please have this rechecked at your primary care follow-up appointment.   Please follow-up with your primary care provider as well as with Dr. Arita Miss within 3 days for reevaluation.  Return to the emergency department for new or worsening symptoms including but not limited to fever, abdominal pain, back pain, flank pain, passing out, chest pain, trouble breathing, numbness, weakness, or any other concerns.

## 2019-12-16 NOTE — ED Notes (Signed)
Dr. Denton Lank in triage to assess patient.

## 2019-12-16 NOTE — ED Notes (Signed)
Charge RN Asher Muir aware pt had fall on plavix

## 2019-12-16 NOTE — ED Provider Notes (Signed)
[facility] EMERGENCY DEPARTMENT Provider Note   CSN: [id number] Arrival date & time: 12/16/19  0913     History Chief Complaint  Patient presents with  . Fall    Lisa [name] is a 86 y.o. female with a hx of CKD, hyperlipidemia, prior stroke, & hypertension who presents to the ED from  [place] assisted [facility] for evaluation S/p fall shortly PTA. Patient states she was in her wheelchair attempting to get dressed, she stood up to put her pants on and fell forward hitting her face/head. No LOC. Called for help. Denies any lightheadedness, dizziness, chest pain, or dyspnea prior to fall or at present. States she is not in a significant amount of pain at this time, she does have some abrasions to her face- she is unsure of last tetanus. She is currently using a wheel chair at all times. Denies headache, neck pain, back pain, chest pain, abdominal pain, numbness, or focal weakness. She is on plavix. She had a fall last week too.   She also mentions she has a problem with recurrent UTIs, has had icnreased urinary frequency recently, currently on Macrobid daily for this.  HPI     Past Medical History:  Diagnosis Date  . Cervicalgia   . Chronic kidney disease   . Chronic kidney disease, stage III (moderate)   . Dermatophytosis of groin and perianal area   . Dyspnea   . Dysrhythmia   . Hyperlipidemia LDL goal < 100   . Insomnia, unspecified   . Muscle weakness (generalized)   . Orthostatic hypotension   . Osteoarthrosis, unspecified whether generalized or localized, lower leg    osteoarthritis - upper thoracic area, left knee.  . Other abnormal glucose   . Other B-complex deficiencies   . Other malaise and fatigue   . Palpitations   . Senile cataract, unspecified   . Shortness of breath   . Stroke [facility])    small "stroke" - right sided weakness for a day.  . Stroke (HCC)   . Tachycardia, unspecified   . Undiagnosed cardiac murmurs   . Unspecified  essential hypertension   . Unspecified vitamin D deficiency   . Urinary tract infection     Patient Active Problem List   Diagnosis Date Noted  . Prolapse of iris in recent wound, left, subsequent encounter 09/16/2019  . Orthostatic syncope 09/04/2019  . Scleral laceration of left eye 09/03/2019  . Fall at home, initial encounter 09/03/2019  . Syncope and collapse 09/03/2019  . Generalized weakness 09/03/2019  . Recurrent UTI 09/03/2019  . Abnormal LFTs 09/03/2019  . Branch retinal vein occlusion with macular edema of right eye 07/07/2019  . Branch retinal vein occlusion with neovascularization of right eye 07/07/2019  . Cystoid macular edema of right eye 07/07/2019  . Exudative age-related macular degeneration of left eye with inactive choroidal neovascularization (HCC) 07/07/2019  . Genitourinary syndrome of menopause 07/01/2019  . Other fatigue 12/03/2017  . Anemia 12/03/2017  . Balance problem 12/03/2017  . Lichen sclerosus of female genitalia 12/03/2017  . Syncope 07/17/2017  . Renal hypertension 07/05/2015  . Neck pain, chronic 07/05/2015  . Status post total left knee replacement 07/05/2015  . Primary osteoarthritis of left knee 01/15/2015  . S/P knee replacement 01/15/2015  . Dry skin 01/28/2014  . HLD (hyperlipidemia) 01/06/2014  . Essential hypertension 01/06/2014  . B12 deficiency 09/29/2013  . Mixed hyperlipidemia 09/29/2013  . Hypertension, renal disease, stage 1-4 or unspecified chronic kidney disease 06/26/2013  .  Seasonal allergies 06/26/2013  . CVA (cerebral vascular accident) (HCC) 04/09/2013  . Stroke (HCC) 04/09/2013  . Insomnia 09/14/2012  . Hyperlipidemia   . Osteoarthritis of right knee   . Undiagnosed cardiac murmurs   . Shortness of breath   . Chronic kidney disease, stage III (moderate) (HCC)   . Cervicalgia   . HYPERTENSION 11/30/2008  . Dyspnea 11/30/2008  . History of cardiovascular disorder 11/30/2008    Past Surgical History:    Procedure Laterality Date  . CATARACT EXTRACTION W/PHACO Bilateral 1994   Dr. Dione BoozeGroat  . CATARACT EXTRACTION, BILATERAL Bilateral   . EYE SURGERY     cataract  . RUPTURED GLOBE EXPLORATION AND REPAIR Left 09/03/2019   Procedure: Scleral laceration repair LEFT EYE, exam under anesthesia LEFT EYE;  Surgeon: Olivia CanterGroat, Richard Scott, MD;  [facility];  Service: Ophthalmology;  Laterality: Left;  . TOE AMPUTATION  2002  . TONSILLECTOMY  1944  . TONSILLECTOMY     younger yrs  . TOTAL KNEE ARTHROPLASTY Left 01/15/2015   Procedure: LEFT TOTAL KNEE ARTHROPLASTY;  Surgeon: Eugenia Mcalpineobert Collins, MD;  Location: [facility] ORS;  Service: Orthopedics;  Laterality: Left;  . TUBAL LIGATION  1951     OB History   No obstetric history on file.     Family History  Problem Relation Age of Onset  . Cerebral aneurysm Mother   . Heart attack Father   . Alzheimer's disease Sister   . Emphysema Sister   . Stroke Sister   . Alzheimer's disease Sister   . Arthritis Sister   . Stroke Sister        Age 84   . Lung cancer Son     Social History   Tobacco Use  . Smoking status: Never Smoker  . Smokeless tobacco: Never Used  Vaping Use  . Vaping Use: Never used  Substance Use Topics  . Alcohol use: Yes    Comment: rare social every 3 or 4 months wine  . Drug use: No    Home Medications Prior to Admission medications   Medication Sig Start Date End Date Taking? Authorizing Provider  Acetaminophen 500 MG capsule Take 2 capsules (1,000 mg total) by mouth at bedtime. 09/25/19   Reed, Tiffany L, DO  amoxicillin (AMOXIL) 500 MG capsule Take 2,000 mg by mouth See admin instructions. Take 2,000 mg by mouth one hour prior to dental procedures 05/21/19   [provider]  b complex vitamins tablet Take 1 tablet by mouth daily.    [provider]  carvedilol (COREG) 6.25 MG tablet Take 6.25 mg by mouth 2 (two) times daily. 06/05/19   [provider]  Cholecalciferol (VITAMIN D3) 10 MCG (400 UNIT)  CAPS Take 400 Units by mouth daily.    [provider]  clobetasol cream (TEMOVATE) 0.05 % Apply 1 application topically 2 (two) times daily. 09/25/19   Reed, Tiffany L, DO  clopidogrel (PLAVIX) 75 MG tablet Take 75 mg by mouth daily. 07/25/19   [provider]  conjugated estrogens (PREMARIN) vaginal cream Place 1 Applicatorful vaginally daily. 09/06/19 09/05/20  Pokhrel, Rebekah ChesterfieldLaxman, MD  Cyanocobalamin (B-12 PO) Take 1 tablet by mouth daily.    [provider]  Multiple Vitamins-Minerals (PRESERVISION AREDS 2+MULTI VIT) CAPS Take 1 capsule by mouth 2 (two) times daily.     [provider]  nitrofurantoin (MACRODANTIN) 50 MG capsule Take 50 mg by mouth at bedtime.    [provider]  ondansetron (ZOFRAN) 4 MG tablet Take 4  mg by mouth.    [provider]  polyethylene glycol (MIRALAX / GLYCOLAX) 17 g packet Take 17 g by mouth as needed.    [provider]  pyridoxine (B-6) 100 MG tablet Take 100 mg by mouth as needed. For burning upon urination.    [provider]  sertraline (ZOLOFT) 25 MG tablet Take 1 tablet (25 mg total) by mouth daily. For anxiety and depression 10/30/19   Reed, Tiffany L, DO  traMADol (ULTRAM) 50 MG tablet Take 1 tablet (50 mg total) by mouth 2 (two) times daily as needed for moderate pain (for pain). 09/25/19 09/24/20  Reed, Tiffany L, DO  traZODone (DESYREL) 50 MG tablet Take 0.5 tablets (25 mg total) by mouth at bedtime. 09/25/19   Reed, Tiffany L, DO  VITAMIN D PO Take 1 tablet by mouth daily.    [provider]    Allergies    Ace inhibitors, Lisinopril, and Statins  Review of Systems   Review of Systems  Constitutional: Negative for chills and fever.  Respiratory: Negative for shortness of breath.   Cardiovascular: Negative for chest pain.  Gastrointestinal: Negative for abdominal pain and vomiting.  Musculoskeletal: Negative for back pain and neck pain.  Skin: Positive for wound.  Neurological:  Negative for seizures, syncope, weakness and numbness.  All other systems reviewed and are negative.   Physical Exam Updated Vital Signs BP (!) 173/92   Pulse 76   Temp 98.7 F (37.1 C) (Oral)   Resp 18   Ht 5\' 7"  (1.702 m)   Wt 62.6 kg   SpO2 98%   BMI 21.61 kg/m   Physical Exam Vitals and nursing note reviewed.  HENT:     Head:     Comments: Abrasions to forehead and to the nose. No active bleeding or appreciable FBs. No racoon eyes or battle sign.  No facial tenderness to palpation.    Nose:     Comments: No septal hematoma noted.     Mouth/Throat:     Pharynx: Oropharynx is clear.  Eyes:     Comments: R pupil is larger than L pupil which is baseline per patient & her daughter.   Neck:     Comments: C-collar in place & maintained on exam.  Cardiovascular:     Rate and Rhythm: Normal rate and regular rhythm.  Pulmonary:     Effort: Pulmonary effort is normal.     Breath sounds: Normal breath sounds.  Chest:     Chest wall: No tenderness.  Abdominal:     General: There is no distension.     Tenderness: There is no abdominal tenderness. There is no guarding or rebound.  Musculoskeletal:     Comments: Upper extremities: skin tear to dorsum of the R hand without active bleeding, purulent drainage, or surrounding erythema/warmth. No focal tony tenderness Back: No midline tenderness or step off.  Lower extremities: No focal bony tenderness.   Skin:    General: Skin is warm and dry.  Neurological:     Mental Status: She is alert.     Comments: Alert. Clear speech. Strength & sensation grossly intact x 4.     ED Results / Procedures / Treatments   Labs (all labs ordered are listed, but only abnormal results are displayed) Labs Reviewed  URINALYSIS, ROUTINE W REFLEX MICROSCOPIC - Abnormal; Notable for the following components:      Result Value   APPearance CLOUDY (*)    Leukocytes,Ua LARGE (*)  WBC, UA >50 (*)    Bacteria, UA MANY (*)    All other components  within normal limits  URINE CULTURE    EKG None  Radiology CT HEAD WO CONTRAST  Result Date: 12/16/2019 CLINICAL DATA:  Head trauma, minor. Neck trauma. Additional history provided: Fall, neck pain. EXAM: CT HEAD WITHOUT CONTRAST CT CERVICAL SPINE WITHOUT CONTRAST TECHNIQUE: Multidetector CT imaging of the head and cervical spine was performed following the standard protocol without intravenous contrast. Multiplanar CT image reconstructions of the cervical spine were also generated. COMPARISON:  Brain MRI 07/17/2017. FINDINGS: CT HEAD FINDINGS Brain: Stable, mild generalized cerebral atrophy. Stable, mild ill-defined hypoattenuation within the cerebral white matter which is nonspecific, but consistent with chronic small vessel ischemic disease. Redemonstrated chronic left corona radiata/basal ganglia lacunar infarct. A small remote infarct within the left cerebellar hemisphere was better appreciated on the prior MRI. There is no acute intracranial hemorrhage. No demarcated cortical infarct. No extra-axial fluid collection. No evidence of intracranial mass. No midline shift. Vascular: No hyperdense vessel.  Atherosclerotic calcifications Skull: Normal. Negative for fracture or focal lesion. Sinuses/Orbits: Visualized orbits show no acute finding. Moderate ethmoid sinus mucosal thickening. Frothy secretions within the right maxillary sinus. Mild left maxillary sinus mucosal thickening. No significant mastoid effusion. Other: Small left parietal scalp hematoma. CT CERVICAL SPINE FINDINGS Alignment: Straightening of the expected cervical lordosis. 2 mm C4-C5 grade 1 anterolisthesis. Trace C5-C6 grade 1 retrolisthesis. Skull base and vertebrae: The basion-dental and atlanto-dental intervals are maintained.No evidence of acute fracture to the cervical spine. Soft tissues and spinal canal: No prevertebral fluid or swelling. No visible canal hematoma. Disc levels: Cervical spondylosis with multilevel disc space  narrowing, disc bulges, posterior disc osteophytes, uncovertebral and facet hypertrophy. Degenerative C3-C4 fusion across the disc space and posterior elements. Upper chest: No consolidation within the imaged lung apices. No visible pneumothorax. IMPRESSION: CT head: 1. No evidence of acute intracranial abnormality. 2. Small left parietal scalp hematoma. 3. Stable mild generalized cerebral atrophy and chronic small vessel ischemic disease. 4. Redemonstrated remote infarcts within the left corona radiata/basal ganglia and left cerebellum. 5. Right maxillary sinusitis. Ethmoid and left maxillary sinus mucosal thickening as described. CT cervical spine: 1. No evidence of acute fracture to the cervical spine. 2. 2 mm C4-C5 grade 1 anterolisthesis. 3. Trace C5-C6 grade 1 retrolisthesis. 4. Cervical spondylosis as described. Electronically Signed   By: Jackey Loge DO   On: 12/16/2019 13:29   CT CERVICAL SPINE WO CONTRAST  Result Date: 12/16/2019 CLINICAL DATA:  Head trauma, minor. Neck trauma. Additional history provided: Fall, neck pain. EXAM: CT HEAD WITHOUT CONTRAST CT CERVICAL SPINE WITHOUT CONTRAST TECHNIQUE: Multidetector CT imaging of the head and cervical spine was performed following the standard protocol without intravenous contrast. Multiplanar CT image reconstructions of the cervical spine were also generated. COMPARISON:  Brain MRI 07/17/2017. FINDINGS: CT HEAD FINDINGS Brain: Stable, mild generalized cerebral atrophy. Stable, mild ill-defined hypoattenuation within the cerebral white matter which is nonspecific, but consistent with chronic small vessel ischemic disease. Redemonstrated chronic left corona radiata/basal ganglia lacunar infarct. A small remote infarct within the left cerebellar hemisphere was better appreciated on the prior MRI. There is no acute intracranial hemorrhage. No demarcated cortical infarct. No extra-axial fluid collection. No evidence of intracranial mass. No midline shift.  Vascular: No hyperdense vessel.  Atherosclerotic calcifications Skull: Normal. Negative for fracture or focal lesion. Sinuses/Orbits: Visualized orbits show no acute finding. Moderate ethmoid sinus mucosal thickening. Frothy secretions within the right maxillary sinus.  Mild left maxillary sinus mucosal thickening. No significant mastoid effusion. Other: Small left parietal scalp hematoma. CT CERVICAL SPINE FINDINGS Alignment: Straightening of the expected cervical lordosis. 2 mm C4-C5 grade 1 anterolisthesis. Trace C5-C6 grade 1 retrolisthesis. Skull base and vertebrae: The basion-dental and atlanto-dental intervals are maintained.No evidence of acute fracture to the cervical spine. Soft tissues and spinal canal: No prevertebral fluid or swelling. No visible canal hematoma. Disc levels: Cervical spondylosis with multilevel disc space narrowing, disc bulges, posterior disc osteophytes, uncovertebral and facet hypertrophy. Degenerative C3-C4 fusion across the disc space and posterior elements. Upper chest: No consolidation within the imaged lung apices. No visible pneumothorax. IMPRESSION: CT head: 1. No evidence of acute intracranial abnormality. 2. Small left parietal scalp hematoma. 3. Stable mild generalized cerebral atrophy and chronic small vessel ischemic disease. 4. Redemonstrated remote infarcts within the left corona radiata/basal ganglia and left cerebellum. 5. Right maxillary sinusitis. Ethmoid and left maxillary sinus mucosal thickening as described. CT cervical spine: 1. No evidence of acute fracture to the cervical spine. 2. 2 mm C4-C5 grade 1 anterolisthesis. 3. Trace C5-C6 grade 1 retrolisthesis. 4. Cervical spondylosis as described. Electronically Signed   By: Jackey Loge DO   On: 12/16/2019 13:29    Procedures Procedures (including critical care time)  Medications Ordered in ED Medications  Tdap (BOOSTRIX) injection 0.5 mL (has no administration in time range)  fluconazole (DIFLUCAN)  tablet 150 mg (has no administration in time range)  cephALEXin (KEFLEX) capsule 500 mg (has no administration in time range)    ED Course  I have reviewed the triage vital signs and the nursing notes.  Pertinent labs & imaging results that were available during my care of the patient were reviewed by me and considered in my medical decision making (see chart for details).    MDM Rules/Calculators/A&P                         Patient presents to the ED for evaluation status post fall from her wheelchair while she was trying to get dressed.  No prodromal symptoms to indicate syncope and no loss of consciousness.  Vitals w/ elevated BP. She has abrasions to her face that do not appear to require repair. Tetanus updated. CT head and C-spine ordered for further assessment.  Urinalysis and culture ordered given her urinary symptoms.  Additional history obtained:  Additional history obtained from chart review nursing note reviewed.. Previous records obtained and reviewed specifically most recent urine culture  Lab Tests:  I Ordered, reviewed, and interpreted labs, which included:  Urinalysis: Concerning for UTI.  Imaging Studies ordered:  I ordered imaging studies which included CT head/C-spine, I independently visualized and interpreted imaging which showed no head bleed or acute spinal fracture.  Patient without point/focal vertebral tenderness or focal neurologic deficits therefore do not suspect significant head/neck/back injury.  C-collar was removed by the patient after CT.  She does not have any chest or abdominal tenderness, she is moving all extremities appropriately without focal bony tenderness.  In regards to her urinalysis-prior cultures have been reviewed, will start on Keflex 3 times daily, in regards to the yeast noted in her urine, discussed with Dr. Denton Lank who recommends one-time dose of fluconazole in the emergency department which has been ordered. Recommend follow up w/ PCP and  her urologist Dr. Arita Miss. I discussed results, treatment plan, need for follow-up, and return precautions with the patient & her daughter @ bedside. Provided opportunity for questions,  patient & her daughter confirmed understanding and are in agreement with plan.   Findings and plan of care discussed with supervising physician Dr. Denton Lank who has evaluated patient as shared visit & is in agreement.   Portions of this note were generated with Scientist, clinical (histocompatibility and immunogenetics). Dictation errors may occur despite best attempts at proofreading.   Final Clinical Impression(s) / ED Diagnoses Final diagnoses:  Fall, initial encounter  Acute cystitis without hematuria    Rx / DC Orders ED Discharge Orders         Ordered    cephALEXin (KEFLEX) 500 MG capsule  3 times daily        12/16/19 [address], [place], PA-C 12/16/19 1403    Cathren Laine, MD 12/17/19 [phone]

## 2019-12-16 NOTE — ED Notes (Signed)
Patient verbalizes understanding of discharge instructions. Opportunity for questioning and answers were provided. Pt discharged from ED via wheelchair.  

## 2019-12-16 NOTE — ED Triage Notes (Addendum)
Pt arrives via [facility] from [facility], pt stood up from wheelchair to put her pants on and fell hitting her nose, no LOC. Pt on plavix, denies neck or back pain, a/ox4.

## 2019-12-17 ENCOUNTER — Encounter (INDEPENDENT_AMBULATORY_CARE_PROVIDER_SITE_OTHER): Payer: Self-pay

## 2019-12-17 ENCOUNTER — Encounter (INDEPENDENT_AMBULATORY_CARE_PROVIDER_SITE_OTHER): Payer: Medicare Other | Admitting: Ophthalmology

## 2019-12-17 DIAGNOSIS — E559 Vitamin D deficiency, unspecified: Secondary | ICD-10-CM | POA: Diagnosis not present

## 2019-12-17 DIAGNOSIS — B9562 Methicillin resistant Staphylococcus aureus infection as the cause of diseases classified elsewhere: Secondary | ICD-10-CM | POA: Diagnosis not present

## 2019-12-17 DIAGNOSIS — Z9849 Cataract extraction status, unspecified eye: Secondary | ICD-10-CM | POA: Diagnosis not present

## 2019-12-17 DIAGNOSIS — N183 Chronic kidney disease, stage 3 unspecified: Secondary | ICD-10-CM | POA: Diagnosis not present

## 2019-12-17 DIAGNOSIS — Z9181 History of falling: Secondary | ICD-10-CM | POA: Diagnosis not present

## 2019-12-17 DIAGNOSIS — G4700 Insomnia, unspecified: Secondary | ICD-10-CM | POA: Diagnosis not present

## 2019-12-17 DIAGNOSIS — I951 Orthostatic hypotension: Secondary | ICD-10-CM | POA: Diagnosis not present

## 2019-12-17 DIAGNOSIS — W19XXXD Unspecified fall, subsequent encounter: Secondary | ICD-10-CM | POA: Diagnosis not present

## 2019-12-17 DIAGNOSIS — I700 Atherosclerosis of aorta: Secondary | ICD-10-CM | POA: Diagnosis not present

## 2019-12-17 DIAGNOSIS — M5030 Other cervical disc degeneration, unspecified cervical region: Secondary | ICD-10-CM | POA: Diagnosis not present

## 2019-12-17 DIAGNOSIS — Z87440 Personal history of urinary (tract) infections: Secondary | ICD-10-CM | POA: Diagnosis not present

## 2019-12-17 DIAGNOSIS — H353222 Exudative age-related macular degeneration, left eye, with inactive choroidal neovascularization: Secondary | ICD-10-CM | POA: Diagnosis not present

## 2019-12-17 DIAGNOSIS — H348311 Tributary (branch) retinal vein occlusion, right eye, with retinal neovascularization: Secondary | ICD-10-CM | POA: Diagnosis not present

## 2019-12-17 DIAGNOSIS — J984 Other disorders of lung: Secondary | ICD-10-CM | POA: Diagnosis not present

## 2019-12-17 DIAGNOSIS — M47815 Spondylosis without myelopathy or radiculopathy, thoracolumbar region: Secondary | ICD-10-CM | POA: Diagnosis not present

## 2019-12-17 DIAGNOSIS — E782 Mixed hyperlipidemia: Secondary | ICD-10-CM | POA: Diagnosis not present

## 2019-12-17 DIAGNOSIS — K760 Fatty (change of) liver, not elsewhere classified: Secondary | ICD-10-CM | POA: Diagnosis not present

## 2019-12-17 DIAGNOSIS — N390 Urinary tract infection, site not specified: Secondary | ICD-10-CM | POA: Diagnosis not present

## 2019-12-17 DIAGNOSIS — S0522XD Ocular laceration and rupture with prolapse or loss of intraocular tissue, left eye, subsequent encounter: Secondary | ICD-10-CM | POA: Diagnosis not present

## 2019-12-17 DIAGNOSIS — M6281 Muscle weakness (generalized): Secondary | ICD-10-CM | POA: Diagnosis not present

## 2019-12-17 DIAGNOSIS — Z96652 Presence of left artificial knee joint: Secondary | ICD-10-CM | POA: Diagnosis not present

## 2019-12-17 DIAGNOSIS — D631 Anemia in chronic kidney disease: Secondary | ICD-10-CM | POA: Diagnosis not present

## 2019-12-17 DIAGNOSIS — E538 Deficiency of other specified B group vitamins: Secondary | ICD-10-CM | POA: Diagnosis not present

## 2019-12-17 DIAGNOSIS — I1310 Hypertensive heart and chronic kidney disease without heart failure, with stage 1 through stage 4 chronic kidney disease, or unspecified chronic kidney disease: Secondary | ICD-10-CM | POA: Diagnosis not present

## 2019-12-17 DIAGNOSIS — Z8673 Personal history of transient ischemic attack (TIA), and cerebral infarction without residual deficits: Secondary | ICD-10-CM | POA: Diagnosis not present

## 2019-12-18 DIAGNOSIS — Z87440 Personal history of urinary (tract) infections: Secondary | ICD-10-CM | POA: Diagnosis not present

## 2019-12-18 DIAGNOSIS — Z96652 Presence of left artificial knee joint: Secondary | ICD-10-CM | POA: Diagnosis not present

## 2019-12-18 DIAGNOSIS — E538 Deficiency of other specified B group vitamins: Secondary | ICD-10-CM | POA: Diagnosis not present

## 2019-12-18 DIAGNOSIS — K760 Fatty (change of) liver, not elsewhere classified: Secondary | ICD-10-CM | POA: Diagnosis not present

## 2019-12-18 DIAGNOSIS — G4700 Insomnia, unspecified: Secondary | ICD-10-CM | POA: Diagnosis not present

## 2019-12-18 DIAGNOSIS — N183 Chronic kidney disease, stage 3 unspecified: Secondary | ICD-10-CM | POA: Diagnosis not present

## 2019-12-18 DIAGNOSIS — D631 Anemia in chronic kidney disease: Secondary | ICD-10-CM | POA: Diagnosis not present

## 2019-12-18 DIAGNOSIS — W19XXXD Unspecified fall, subsequent encounter: Secondary | ICD-10-CM | POA: Diagnosis not present

## 2019-12-18 DIAGNOSIS — M5030 Other cervical disc degeneration, unspecified cervical region: Secondary | ICD-10-CM | POA: Diagnosis not present

## 2019-12-18 DIAGNOSIS — M6281 Muscle weakness (generalized): Secondary | ICD-10-CM | POA: Diagnosis not present

## 2019-12-18 DIAGNOSIS — J984 Other disorders of lung: Secondary | ICD-10-CM | POA: Diagnosis not present

## 2019-12-18 DIAGNOSIS — H353222 Exudative age-related macular degeneration, left eye, with inactive choroidal neovascularization: Secondary | ICD-10-CM | POA: Diagnosis not present

## 2019-12-18 DIAGNOSIS — Z8673 Personal history of transient ischemic attack (TIA), and cerebral infarction without residual deficits: Secondary | ICD-10-CM | POA: Diagnosis not present

## 2019-12-18 DIAGNOSIS — I700 Atherosclerosis of aorta: Secondary | ICD-10-CM | POA: Diagnosis not present

## 2019-12-18 DIAGNOSIS — I951 Orthostatic hypotension: Secondary | ICD-10-CM | POA: Diagnosis not present

## 2019-12-18 DIAGNOSIS — E559 Vitamin D deficiency, unspecified: Secondary | ICD-10-CM | POA: Diagnosis not present

## 2019-12-18 DIAGNOSIS — M47815 Spondylosis without myelopathy or radiculopathy, thoracolumbar region: Secondary | ICD-10-CM | POA: Diagnosis not present

## 2019-12-18 DIAGNOSIS — Z9181 History of falling: Secondary | ICD-10-CM | POA: Diagnosis not present

## 2019-12-18 DIAGNOSIS — B9562 Methicillin resistant Staphylococcus aureus infection as the cause of diseases classified elsewhere: Secondary | ICD-10-CM | POA: Diagnosis not present

## 2019-12-18 DIAGNOSIS — H348311 Tributary (branch) retinal vein occlusion, right eye, with retinal neovascularization: Secondary | ICD-10-CM | POA: Diagnosis not present

## 2019-12-18 DIAGNOSIS — E782 Mixed hyperlipidemia: Secondary | ICD-10-CM | POA: Diagnosis not present

## 2019-12-18 DIAGNOSIS — S0522XD Ocular laceration and rupture with prolapse or loss of intraocular tissue, left eye, subsequent encounter: Secondary | ICD-10-CM | POA: Diagnosis not present

## 2019-12-18 DIAGNOSIS — Z9849 Cataract extraction status, unspecified eye: Secondary | ICD-10-CM | POA: Diagnosis not present

## 2019-12-18 DIAGNOSIS — N390 Urinary tract infection, site not specified: Secondary | ICD-10-CM | POA: Diagnosis not present

## 2019-12-18 DIAGNOSIS — I1310 Hypertensive heart and chronic kidney disease without heart failure, with stage 1 through stage 4 chronic kidney disease, or unspecified chronic kidney disease: Secondary | ICD-10-CM | POA: Diagnosis not present

## 2019-12-18 LAB — URINE CULTURE: Culture: >=100000 — AB

## 2019-12-19 ENCOUNTER — Telehealth (HOSPITAL_BASED_OUTPATIENT_CLINIC_OR_DEPARTMENT_OTHER): Payer: Self-pay | Admitting: Emergency Medicine

## 2019-12-19 DIAGNOSIS — R55 Syncope and collapse: Secondary | ICD-10-CM | POA: Diagnosis not present

## 2019-12-19 DIAGNOSIS — Z9181 History of falling: Secondary | ICD-10-CM | POA: Diagnosis not present

## 2019-12-19 DIAGNOSIS — M6282 Rhabdomyolysis: Secondary | ICD-10-CM | POA: Diagnosis not present

## 2019-12-19 NOTE — Progress Notes (Signed)
ED Antimicrobial Stewardship Positive Culture Follow Up   Lisa [name] is an 86 y.o. female who presented to [facility] on 12/16/2019 with a chief complaint of  Chief Complaint  Patient presents with  . Fall    Recent Results (from the past 720 hour(s))  Urine culture     Status: Abnormal   Collection Time: 12/16/19  3:29 PM   Specimen: Urine, Random  Result Value Ref Range Status   Specimen Description URINE, RANDOM  Final   Special Requests   Final    NONE Performed at [facility], [address] [place], [place] [address]    Culture >=100,000 COLONIES/mL KLEBSIELLA ORNITHINOLYTICA (A)  Final   Report Status 12/18/2019 FINAL  Final   Organism ID, Bacteria KLEBSIELLA ORNITHINOLYTICA (A)  Final      Susceptibility   Klebsiella ornithinolytica - MIC*    AMPICILLIN 16 INTERMEDIATE Intermediate     CEFAZOLIN 32 INTERMEDIATE Intermediate     CEFTRIAXONE 32 RESISTANT Resistant     CIPROFLOXACIN <=0.25 SENSITIVE Sensitive     GENTAMICIN <=1 SENSITIVE Sensitive     IMIPENEM 0.5 SENSITIVE Sensitive     NITROFURANTOIN >=512 RESISTANT Resistant     TRIMETH/SULFA <=20 SENSITIVE Sensitive     AMPICILLIN/SULBACTAM <=2 SENSITIVE Sensitive     PIP/TAZO <=4 SENSITIVE Sensitive     * >=100,000 COLONIES/mL KLEBSIELLA ORNITHINOLYTICA    [x]  Treated with cephalexin, organism resistant to prescribed antimicrobial []  Patient discharged originally without antimicrobial agent and treatment is now indicated  New antibiotic prescription: Symptom check - If continued symptoms without improvement, DC cephalexin and start augmentin 500mg  PO BID x 5 days  ED Provider: , PA-C   Lisa [name], 12/19/2019, 8:06 AM Clinical Pharmacist Monday - Friday phone -  [phone] Saturday - Sunday phone - [phone]

## 2019-12-19 NOTE — Telephone Encounter (Signed)
Post ED Visit - Positive Culture Follow-up: Successful Patient Follow-Up  Culture assessed and recommendations reviewed by:  []  , Pharm.D. []  Enzo Bi, .D., BCPS AQ-ID []  Celedonio Miyamoto, Pharm.D., BCPS []  [address], .D., BCPS []  [place], .D., BCPS, AAHIVP []  Georgina Pillion, Pharm.D., BCPS, AAHIVP [x]  [address], PharmD, BCPS []  , PharmD, BCPS []  [place], PharmD, BCPS []  [address], PharmD  Positive urine culture  []  Patient discharged without antimicrobial prescription and treatment is now indicated [x]  Organism is resistant to prescribed ED discharge antimicrobial []  Patient with positive blood cultures  Changes discussed with ED provider: PA New antibiotic prescription: Augmentin 500 mg PO BID x five days Called and faxed to [facility]  Contacted patient's facility , date 12/19/2019, time 1000 Phone: [phone] Fax:  [phone]   Phillips Climes 12/19/2019, 11:31 AM

## 2019-12-22 DIAGNOSIS — J984 Other disorders of lung: Secondary | ICD-10-CM | POA: Diagnosis not present

## 2019-12-22 DIAGNOSIS — B9562 Methicillin resistant Staphylococcus aureus infection as the cause of diseases classified elsewhere: Secondary | ICD-10-CM | POA: Diagnosis not present

## 2019-12-22 DIAGNOSIS — I700 Atherosclerosis of aorta: Secondary | ICD-10-CM | POA: Diagnosis not present

## 2019-12-22 DIAGNOSIS — Z9181 History of falling: Secondary | ICD-10-CM | POA: Diagnosis not present

## 2019-12-22 DIAGNOSIS — D631 Anemia in chronic kidney disease: Secondary | ICD-10-CM | POA: Diagnosis not present

## 2019-12-22 DIAGNOSIS — G4700 Insomnia, unspecified: Secondary | ICD-10-CM | POA: Diagnosis not present

## 2019-12-22 DIAGNOSIS — S0522XD Ocular laceration and rupture with prolapse or loss of intraocular tissue, left eye, subsequent encounter: Secondary | ICD-10-CM | POA: Diagnosis not present

## 2019-12-22 DIAGNOSIS — Z8673 Personal history of transient ischemic attack (TIA), and cerebral infarction without residual deficits: Secondary | ICD-10-CM | POA: Diagnosis not present

## 2019-12-22 DIAGNOSIS — E538 Deficiency of other specified B group vitamins: Secondary | ICD-10-CM | POA: Diagnosis not present

## 2019-12-22 DIAGNOSIS — H353222 Exudative age-related macular degeneration, left eye, with inactive choroidal neovascularization: Secondary | ICD-10-CM | POA: Diagnosis not present

## 2019-12-22 DIAGNOSIS — M5030 Other cervical disc degeneration, unspecified cervical region: Secondary | ICD-10-CM | POA: Diagnosis not present

## 2019-12-22 DIAGNOSIS — Z87440 Personal history of urinary (tract) infections: Secondary | ICD-10-CM | POA: Diagnosis not present

## 2019-12-22 DIAGNOSIS — N390 Urinary tract infection, site not specified: Secondary | ICD-10-CM | POA: Diagnosis not present

## 2019-12-22 DIAGNOSIS — I1310 Hypertensive heart and chronic kidney disease without heart failure, with stage 1 through stage 4 chronic kidney disease, or unspecified chronic kidney disease: Secondary | ICD-10-CM | POA: Diagnosis not present

## 2019-12-22 DIAGNOSIS — I951 Orthostatic hypotension: Secondary | ICD-10-CM | POA: Diagnosis not present

## 2019-12-22 DIAGNOSIS — M47815 Spondylosis without myelopathy or radiculopathy, thoracolumbar region: Secondary | ICD-10-CM | POA: Diagnosis not present

## 2019-12-22 DIAGNOSIS — Z9849 Cataract extraction status, unspecified eye: Secondary | ICD-10-CM | POA: Diagnosis not present

## 2019-12-22 DIAGNOSIS — Z96652 Presence of left artificial knee joint: Secondary | ICD-10-CM | POA: Diagnosis not present

## 2019-12-22 DIAGNOSIS — N183 Chronic kidney disease, stage 3 unspecified: Secondary | ICD-10-CM | POA: Diagnosis not present

## 2019-12-22 DIAGNOSIS — H348311 Tributary (branch) retinal vein occlusion, right eye, with retinal neovascularization: Secondary | ICD-10-CM | POA: Diagnosis not present

## 2019-12-22 DIAGNOSIS — W19XXXD Unspecified fall, subsequent encounter: Secondary | ICD-10-CM | POA: Diagnosis not present

## 2019-12-22 DIAGNOSIS — K760 Fatty (change of) liver, not elsewhere classified: Secondary | ICD-10-CM | POA: Diagnosis not present

## 2019-12-22 DIAGNOSIS — M6281 Muscle weakness (generalized): Secondary | ICD-10-CM | POA: Diagnosis not present

## 2019-12-22 DIAGNOSIS — E782 Mixed hyperlipidemia: Secondary | ICD-10-CM | POA: Diagnosis not present

## 2019-12-22 DIAGNOSIS — E559 Vitamin D deficiency, unspecified: Secondary | ICD-10-CM | POA: Diagnosis not present

## 2019-12-25 ENCOUNTER — Telehealth: Payer: Self-pay

## 2019-12-25 DIAGNOSIS — B9562 Methicillin resistant Staphylococcus aureus infection as the cause of diseases classified elsewhere: Secondary | ICD-10-CM | POA: Diagnosis not present

## 2019-12-25 DIAGNOSIS — H348311 Tributary (branch) retinal vein occlusion, right eye, with retinal neovascularization: Secondary | ICD-10-CM | POA: Diagnosis not present

## 2019-12-25 DIAGNOSIS — Z87440 Personal history of urinary (tract) infections: Secondary | ICD-10-CM | POA: Diagnosis not present

## 2019-12-25 DIAGNOSIS — Z96652 Presence of left artificial knee joint: Secondary | ICD-10-CM | POA: Diagnosis not present

## 2019-12-25 DIAGNOSIS — H353222 Exudative age-related macular degeneration, left eye, with inactive choroidal neovascularization: Secondary | ICD-10-CM | POA: Diagnosis not present

## 2019-12-25 DIAGNOSIS — Z9181 History of falling: Secondary | ICD-10-CM | POA: Diagnosis not present

## 2019-12-25 DIAGNOSIS — E782 Mixed hyperlipidemia: Secondary | ICD-10-CM | POA: Diagnosis not present

## 2019-12-25 DIAGNOSIS — N183 Chronic kidney disease, stage 3 unspecified: Secondary | ICD-10-CM | POA: Diagnosis not present

## 2019-12-25 DIAGNOSIS — S0522XD Ocular laceration and rupture with prolapse or loss of intraocular tissue, left eye, subsequent encounter: Secondary | ICD-10-CM | POA: Diagnosis not present

## 2019-12-25 DIAGNOSIS — I1310 Hypertensive heart and chronic kidney disease without heart failure, with stage 1 through stage 4 chronic kidney disease, or unspecified chronic kidney disease: Secondary | ICD-10-CM | POA: Diagnosis not present

## 2019-12-25 DIAGNOSIS — D631 Anemia in chronic kidney disease: Secondary | ICD-10-CM | POA: Diagnosis not present

## 2019-12-25 DIAGNOSIS — G4700 Insomnia, unspecified: Secondary | ICD-10-CM | POA: Diagnosis not present

## 2019-12-25 DIAGNOSIS — K760 Fatty (change of) liver, not elsewhere classified: Secondary | ICD-10-CM | POA: Diagnosis not present

## 2019-12-25 DIAGNOSIS — Z8673 Personal history of transient ischemic attack (TIA), and cerebral infarction without residual deficits: Secondary | ICD-10-CM | POA: Diagnosis not present

## 2019-12-25 DIAGNOSIS — E559 Vitamin D deficiency, unspecified: Secondary | ICD-10-CM | POA: Diagnosis not present

## 2019-12-25 DIAGNOSIS — I951 Orthostatic hypotension: Secondary | ICD-10-CM | POA: Diagnosis not present

## 2019-12-25 DIAGNOSIS — N390 Urinary tract infection, site not specified: Secondary | ICD-10-CM | POA: Diagnosis not present

## 2019-12-25 DIAGNOSIS — Z9849 Cataract extraction status, unspecified eye: Secondary | ICD-10-CM | POA: Diagnosis not present

## 2019-12-25 DIAGNOSIS — M6281 Muscle weakness (generalized): Secondary | ICD-10-CM | POA: Diagnosis not present

## 2019-12-25 DIAGNOSIS — M47815 Spondylosis without myelopathy or radiculopathy, thoracolumbar region: Secondary | ICD-10-CM | POA: Diagnosis not present

## 2019-12-25 DIAGNOSIS — M5030 Other cervical disc degeneration, unspecified cervical region: Secondary | ICD-10-CM | POA: Diagnosis not present

## 2019-12-25 DIAGNOSIS — W19XXXD Unspecified fall, subsequent encounter: Secondary | ICD-10-CM | POA: Diagnosis not present

## 2019-12-25 DIAGNOSIS — E538 Deficiency of other specified B group vitamins: Secondary | ICD-10-CM | POA: Diagnosis not present

## 2019-12-25 DIAGNOSIS — I700 Atherosclerosis of aorta: Secondary | ICD-10-CM | POA: Diagnosis not present

## 2019-12-25 DIAGNOSIS — J984 Other disorders of lung: Secondary | ICD-10-CM | POA: Diagnosis not present

## 2019-12-25 NOTE — Telephone Encounter (Signed)
[name] state the as the Physical therapist went out today to discharge Lisa [name] he noticed that she had 2 new wounds . [name] would like to set up a nurse visit with your permission to look at at dress the wounds.   Please advise ?

## 2019-12-25 NOTE — Telephone Encounter (Signed)
Of course sending home health nurse out is ok

## 2019-12-26 NOTE — Telephone Encounter (Signed)
Message given to Ashley.

## 2020-01-01 DIAGNOSIS — R8279 Other abnormal findings on microbiological examination of urine: Secondary | ICD-10-CM | POA: Diagnosis not present

## 2020-01-01 DIAGNOSIS — N3020 Other chronic cystitis without hematuria: Secondary | ICD-10-CM | POA: Diagnosis not present

## 2020-01-01 DIAGNOSIS — R300 Dysuria: Secondary | ICD-10-CM | POA: Diagnosis not present

## 2020-01-05 ENCOUNTER — Other Ambulatory Visit: Payer: Self-pay

## 2020-01-05 ENCOUNTER — Telehealth: Payer: Self-pay

## 2020-01-05 ENCOUNTER — Ambulatory Visit (INDEPENDENT_AMBULATORY_CARE_PROVIDER_SITE_OTHER): Payer: Medicare Other | Admitting: Ophthalmology

## 2020-01-05 ENCOUNTER — Encounter (INDEPENDENT_AMBULATORY_CARE_PROVIDER_SITE_OTHER): Payer: Self-pay | Admitting: Ophthalmology

## 2020-01-05 DIAGNOSIS — H353122 Nonexudative age-related macular degeneration, left eye, intermediate dry stage: Secondary | ICD-10-CM

## 2020-01-05 DIAGNOSIS — H34831 Tributary (branch) retinal vein occlusion, right eye, with macular edema: Secondary | ICD-10-CM

## 2020-01-05 DIAGNOSIS — M5030 Other cervical disc degeneration, unspecified cervical region: Secondary | ICD-10-CM

## 2020-01-05 MED ORDER — BEVACIZUMAB CHEMO INJECTION 1.25MG/0.05ML SYRINGE FOR KALEIDOSCOPE
1.2500 mg | INTRAVITREAL | Status: AC | PRN
  Administered 2020-01-05: 1.25 mg via INTRAVITREAL

## 2020-01-05 MED ORDER — TRAMADOL HCL 50 MG PO TABS: 50.0000 mg | ORAL_TABLET | Freq: Two times a day (BID) | ORAL | 5 refills | Status: DC | PRN

## 2020-01-05 NOTE — Progress Notes (Signed)
01/05/2020     CHIEF COMPLAINT Patient presents for Retina Follow Up   HISTORY OF PRESENT ILLNESS: Lisa [name] is a 86 y.o. female who presents to the clinic today for:   HPI    Retina Follow Up    Patient presents with  CRVO/BRVO.  In right eye.  This started 10 weeks ago.  Severity is mild.  Duration of 10 weeks.  Since onset it is stable.          Comments    10 Week BRVO F/U OD, poss Avastin OD  Pt denies noticeable changes to [facility] OU since last visit. Pt denies ocular pain, flashes of light, or floaters OU.         Last edited by Ileana RoupKennerly, Paige, COA on 01/05/2020  8:41 AM. (History)      Referring physician: Kermit Baloeed, Tiffany L, DO [address] [place] [id number]  HISTORICAL INFORMATION:   Selected notes from the MEDICAL RECORD NUMBER    Lab Results  Component Value Date   HGBA1C 5.7 (H) 12/26/2018     CURRENT MEDICATIONS: No current outpatient medications on file. (Ophthalmic Drugs)   No current facility-administered medications for this visit. (Ophthalmic Drugs)   Current Outpatient Medications (Other)  Medication Sig  . Acetaminophen 500 MG capsule Take 2 capsules (1,000 mg total) by mouth at bedtime.  Marland Kitchen. b complex vitamins tablet Take 1 tablet by mouth daily.  . carvedilol (COREG) 6.25 MG tablet Take 6.25 mg by mouth 2 (two) times daily.  . cephALEXin (KEFLEX) 500 MG capsule Take 1 capsule (500 mg total) by mouth 3 (three) times daily.  . Cholecalciferol (VITAMIN D3) 10 MCG (400 UNIT) CAPS Take 400 Units by mouth daily.  . clobetasol cream (TEMOVATE) 0.05 % Apply 1 application topically 2 (two) times daily.  . clopidogrel (PLAVIX) 75 MG tablet Take 75 mg by mouth daily.  Marland Kitchen. conjugated estrogens (PREMARIN) vaginal cream Place 1 Applicatorful vaginally daily.  . Cyanocobalamin (B-12 PO) Take 1 tablet by mouth daily.  . Multiple Vitamins-Minerals (PRESERVISION AREDS 2+MULTI VIT) CAPS Take 1 capsule by mouth 2 (two) times daily.   .  nitrofurantoin (MACRODANTIN) 50 MG capsule Take 50 mg by mouth at bedtime.  . ondansetron (ZOFRAN) 4 MG tablet Take 4 mg by mouth.  . polyethylene glycol (MIRALAX / GLYCOLAX) 17 g packet Take 17 g by mouth as needed.  . pyridoxine (B-6) 100 MG tablet Take 100 mg by mouth as needed. For burning upon urination.  . sertraline (ZOLOFT) 25 MG tablet Take 1 tablet (25 mg total) by mouth daily. For anxiety and depression  . traMADol (ULTRAM) 50 MG tablet Take 1 tablet (50 mg total) by mouth 2 (two) times daily as needed for moderate pain (for pain).  . traZODone (DESYREL) 50 MG tablet Take 0.5 tablets (25 mg total) by mouth at bedtime.  Marland Kitchen. VITAMIN D PO Take 1 tablet by mouth daily.   No current facility-administered medications for this visit. (Other)      REVIEW OF SYSTEMS:    ALLERGIES Allergies  Allergen Reactions  . Ace Inhibitors Cough  . Lisinopril Cough  . Statins Other (See Comments)    Body aches and pains    PAST MEDICAL HISTORY Past Medical History:  Diagnosis Date  . Cervicalgia   . Chronic kidney disease   . Chronic kidney disease, stage III (moderate) (HCC)   . Dermatophytosis of groin and perianal area   . Dyspnea   .  Dysrhythmia   . Hyperlipidemia LDL goal < 100   . Insomnia, unspecified   . Muscle weakness (generalized)   . Orthostatic hypotension   . Osteoarthrosis, unspecified whether generalized or localized, lower leg    osteoarthritis - upper thoracic area, left knee.  . Other abnormal glucose   . Other B-complex deficiencies   . Other malaise and fatigue   . Palpitations   . Senile cataract, unspecified   . Shortness of breath   . Stroke [facility])    small "stroke" - right sided weakness for a day.  . Stroke (HCC)   . Tachycardia, unspecified   . Undiagnosed cardiac murmurs   . Unspecified essential hypertension   . Unspecified vitamin D deficiency   . Urinary tract infection    Past Surgical History:  Procedure Laterality Date  . CATARACT  EXTRACTION W/PHACO Bilateral 1994   Dr. Dione Booze  . CATARACT EXTRACTION, BILATERAL Bilateral   . EYE SURGERY     cataract  . RUPTURED GLOBE EXPLORATION AND REPAIR Left 09/03/2019   Procedure: Scleral laceration repair LEFT EYE, exam under anesthesia LEFT EYE;  Surgeon: Olivia Canter, MD;  Location: [facility] OR;  Service: Ophthalmology;  Laterality: Left;  . TOE AMPUTATION  2002  . TONSILLECTOMY  1944  . TONSILLECTOMY     younger yrs  . TOTAL KNEE ARTHROPLASTY Left 01/15/2015   Procedure: LEFT TOTAL KNEE ARTHROPLASTY;  Surgeon: Eugenia Mcalpine, MD;  Location: [facility] ORS;  Service: Orthopedics;  Laterality: Left;  . TUBAL LIGATION  1951    FAMILY HISTORY Family History  Problem Relation Age of Onset  . Cerebral aneurysm Mother   . Heart attack Father   . Alzheimer's disease Sister   . Emphysema Sister   . Stroke Sister   . Alzheimer's disease Sister   . Arthritis Sister   . Stroke Sister        Age 11   . Lung cancer Son     SOCIAL HISTORY Social History   Tobacco Use  . Smoking status: Never Smoker  . Smokeless tobacco: Never Used  Vaping Use  . Vaping Use: Never used  Substance Use Topics  . Alcohol use: Yes    Comment: rare social every 3 or 4 months wine  . Drug use: No         OPHTHALMIC EXAM:  Base Eye Exam    Visual Acuity (ETDRS)      Right Left   Dist cc 20/50 -2 20/200   Dist ph cc 20/30 -2 NI   Correction: Glasses       Tonometry (Tonopen, 8:41 AM)      Right Left   Pressure 08 09       Pupils      Dark Light Shape React APD   Right 3 2 Round Sluggish None   Left 6 6 Irregular Minimal None       Visual Fields (Counting fingers)      Left Right    Full Full       Extraocular Movement      Right Left    Full Full       Neuro/Psych    Oriented x3: Yes   Mood/Affect: Normal       Dilation    Right eye: 1.0% Mydriacyl, 2.5% Phenylephrine @ 8:48 AM        Slit Lamp and Fundus Exam    External Exam      Right Left   External Normal  Normal       Slit Lamp Exam      Right Left   Lids/Lashes Normal Normal   Conjunctiva/Sclera White and quiet White and quiet   Cornea Clear Clear   Anterior Chamber Deep and quiet Deep and quiet   Iris Round and reactive keyhole iris loss from 8-9 meridian nasally   Lens Posterior chamber intraocular lens Posterior chamber intraocular lens   Anterior Vitreous Normal Normal, old pigment posterior to iol       Fundus Exam      Right Left   Posterior Vitreous Posterior vitreous detachment    Disc Normal    C/D Ratio 0.25    Macula no macular thickening, Hard drusen, no exudates, no hemorrhage    Vessels Inferior macular branch retinal vein occlusion.  Much less thickening    Periphery Normal           IMAGING AND PROCEDURES  Imaging and Procedures for 01/05/20  OCT, Retina - OU - Both Eyes       Right Eye Quality was good. Scan locations included subfoveal. Central Foveal Thickness: 271. Progression has been stable. Findings include cystoid macular edema.   Left Eye Quality was good. Scan locations included subfoveal. Central Foveal Thickness: 379. Findings include subretinal hyper-reflective material, retinal drusen , no SRF, no IRF, vitreomacular adhesion .   Notes Region of CME from superior branch retinal vein occlusion OD , Minor at 10-week interval post intravitreal Avastin.  We will repeat intravitreal Avastin OD for stabilization and examination OU in 3 months  OS with large subfoveal drusenoid debris deposit unchanged over the last 9 to 12 months       Intravitreal Injection, Pharmacologic Agent - OD - Right Eye       Time Out 01/05/2020. 9:17 AM. Confirmed correct patient, procedure, site, and patient consented.   Anesthesia Topical anesthesia was used. Anesthetic medications included Akten 3.5%.   Procedure Preparation included Tobramycin 0.3%, 10% betadine to eyelids, 5% betadine to ocular surface. A 30 gauge needle was used.   Injection:  1.25 mg  Bevacizumab (AVASTIN) SOLN   NDC: 70360-001-02   Route: Intravitreal, Site: Right Eye, Waste: 0 mg  Post-op Post injection exam found visual acuity of at least counting fingers. The patient tolerated the procedure well. There were no complications. The patient received written and verbal post procedure care education. Post injection medications were not given.                 ASSESSMENT/PLAN:  Branch retinal vein occlusion with macular edema of right eye The nature of branch retinal vein occlusion with macular edema was discussed.  The patient was given access to printed information.  The treatment options including continued observation looking for spontaneous resolution versus grid laser versus intravitreal Kenalog injection were discussed.  PRIMARY THERAPY CONSISTS of Anti-VEGF Therapies, AVASTIN, LUCENTIS AND EYLEA.  Their usage was discussed to assist in halting the progression of Macular Edema, in order to preserve, protect or improve acuity.  Additionally, at times, limited focal laser therapy is used in the management.  The risks and benefits of all these options were discussed with the patient.  The patient's questions were answered.  OD, currently at 10-week follow-up OD stable at 10 weeks post Avastin, will repeat injection today and extend interval of examination to 3 months for OU  Intermediate stage nonexudative age-related macular degeneration of left eye The nature of age--related macular degeneration was discussed with the patient as well as  the distinction between dry and wet types. Checking an Amsler Grid daily with advice to return immediately should a distortion develop, was given to the patient. The patient 's smoking status now and in the past was determined and advice based on the AREDS study was provided regarding the consumption of antioxidant supplements. AREDS 2 vitamin formulation was recommended. Consumption of dark leafy vegetables and fresh fruits of various  colors was recommended. Treatment modalities for wet macular degeneration particularly the use of intravitreal injections of anti-blood vessel growth factors was discussed with the patient. Avastin, Lucentis, and Eylea are the available options. On occasion, therapy includes the use of photodynamic therapy and thermal laser. Stressed to the patient do not rub eyes.  Patient was advised to check Amsler Grid daily and return immediately if changes are noted. Instructions on using the grid were given to the patient. All patient questions were answered.      ICD-10-CM   1. Branch retinal vein occlusion with macular edema of right eye  H34.8310 OCT, Retina - OU - Both Eyes    Intravitreal Injection, Pharmacologic Agent - OD - Right Eye    Bevacizumab (AVASTIN) SOLN 1.25 mg  2. Intermediate stage nonexudative age-related macular degeneration of left eye  H35.3122     1.  Patient reports missing previous appointments due to recurrent falls.  Will delay now at 10-week interval status post most recent Avastin for CME associated with BRVO, confirm stability of the findings and thus we will repeat today to maintain best visual acuity status OD and yet extend interval of examination to 3 months OU  2.  OS does have large subfoveal drusenoid component with inner retinal elevation suggesting possible vitreal macular adhesion attributing to this, yet will observe at this time.  3.  Continues under the care of Dr. Wallie Renshaw for prior damage and trauma to the left eye  Ophthalmic Meds Ordered this visit:  Meds ordered this encounter  Medications  . Bevacizumab (AVASTIN) SOLN 1.25 mg       Return in about 3 months (around 04/06/2020) for DILATE OU, AVASTIN OCT, OD.  Patient Instructions  Patient instructed contact the office promptly for new onset visual acuity declines or distortions.    Explained the diagnoses, plan, and follow up with the patient and they expressed understanding.  Patient expressed  understanding of the importance of proper follow up care.   Alford Highland Eulice Rutledge M.D. Diseases & Surgery of the Retina and Vitreous [facility] 01/05/20     Abbreviations: M myopia (nearsighted); A astigmatism; H hyperopia (farsighted); P presbyopia; Mrx spectacle prescription;  CTL contact lenses; OD right eye; OS left eye; OU both eyes  XT exotropia; ET esotropia; PEK punctate epithelial keratitis; PEE punctate epithelial erosions; DES dry eye syndrome; MGD meibomian gland dysfunction; ATs artificial tears; PFAT's preservative free artificial tears; NSC nuclear sclerotic cataract; PSC posterior subcapsular cataract; ERM epi-retinal membrane; PVD posterior vitreous detachment; RD retinal detachment; DM diabetes mellitus; DR diabetic retinopathy; NPDR non-proliferative diabetic retinopathy; PDR proliferative diabetic retinopathy; CSME clinically significant macular edema; DME diabetic macular edema; dbh dot blot hemorrhages; CWS cotton wool spot; POAG primary open angle glaucoma; C/D cup-to-disc ratio; HVF humphrey visual field; GVF goldmann visual field; OCT optical coherence tomography; IOP intraocular pressure; BRVO Branch retinal vein occlusion; CRVO central retinal vein occlusion; CRAO central retinal artery occlusion; BRAO branch retinal artery occlusion; RT retinal tear; SB scleral buckle; PPV pars plana vitrectomy; VH Vitreous hemorrhage; PRP panretinal laser photocoagulation; IVK  intravitreal kenalog; VMT vitreomacular traction; MH Macular hole;  NVD neovascularization of the disc; NVE neovascularization elsewhere; AREDS age related eye disease study; ARMD age related macular degeneration; POAG primary open angle glaucoma; EBMD epithelial/anterior basement membrane dystrophy; ACIOL anterior chamber intraocular lens; IOL intraocular lens; PCIOL posterior chamber intraocular lens; Phaco/IOL phacoemulsification with intraocular lens placement; [place] photorefractive keratectomy; LASIK laser  assisted in situ keratomileusis; HTN hypertension; DM diabetes mellitus; COPD chronic obstructive pulmonary disease

## 2020-01-05 NOTE — Patient Instructions (Signed)
Patient instructed contact the office promptly for new onset visual acuity declines or distortions.

## 2020-01-05 NOTE — Assessment & Plan Note (Signed)

## 2020-01-05 NOTE — Telephone Encounter (Signed)
Incoming call received from [place] at [facility] requesting refill on Tramadol, please send to [facility].   RX last filled 09/25/2019  No treatment agreement on file, notation made on pending appointment scheduled for 01/29/2020

## 2020-01-05 NOTE — Assessment & Plan Note (Signed)
The nature of branch retinal vein occlusion with macular edema was discussed.  The patient was given access to printed information.  The treatment options including continued observation looking for spontaneous resolution versus grid laser versus intravitreal Kenalog injection were discussed.  PRIMARY THERAPY CONSISTS of Anti-VEGF Therapies, AVASTIN, LUCENTIS AND EYLEA.  Their usage was discussed to assist in halting the progression of Macular Edema, in order to preserve, protect or improve acuity.  Additionally, at times, limited focal laser therapy is used in the management.  The risks and benefits of all these options were discussed with the patient.  The patient's questions were answered.  OD, currently at 10-week follow-up OD stable at 10 weeks post Avastin, will repeat injection today and extend interval of examination to 3 months for OU

## 2020-01-16 DIAGNOSIS — N3281 Overactive bladder: Secondary | ICD-10-CM | POA: Diagnosis not present

## 2020-01-16 DIAGNOSIS — R8279 Other abnormal findings on microbiological examination of urine: Secondary | ICD-10-CM | POA: Diagnosis not present

## 2020-01-16 DIAGNOSIS — R300 Dysuria: Secondary | ICD-10-CM | POA: Diagnosis not present

## 2020-01-19 DIAGNOSIS — R55 Syncope and collapse: Secondary | ICD-10-CM | POA: Diagnosis not present

## 2020-01-19 DIAGNOSIS — Z9181 History of falling: Secondary | ICD-10-CM | POA: Diagnosis not present

## 2020-01-19 DIAGNOSIS — M6282 Rhabdomyolysis: Secondary | ICD-10-CM | POA: Diagnosis not present

## 2020-01-29 ENCOUNTER — Encounter: Payer: Self-pay | Admitting: Internal Medicine

## 2020-01-29 ENCOUNTER — Other Ambulatory Visit: Payer: Self-pay

## 2020-01-29 ENCOUNTER — Ambulatory Visit (INDEPENDENT_AMBULATORY_CARE_PROVIDER_SITE_OTHER): Payer: Medicare Other | Admitting: Internal Medicine

## 2020-01-29 VITALS — BP 160/70 | HR 67 | Temp 97.1°F | Resp 16 | Ht 67.0 in | Wt 113.0 lb

## 2020-01-29 DIAGNOSIS — R634 Abnormal weight loss: Secondary | ICD-10-CM

## 2020-01-29 DIAGNOSIS — N958 Other specified menopausal and perimenopausal disorders: Secondary | ICD-10-CM

## 2020-01-29 DIAGNOSIS — L900 Lichen sclerosus et atrophicus: Secondary | ICD-10-CM

## 2020-01-29 DIAGNOSIS — R2681 Unsteadiness on feet: Secondary | ICD-10-CM

## 2020-01-29 DIAGNOSIS — D692 Other nonthrombocytopenic purpura: Secondary | ICD-10-CM | POA: Diagnosis not present

## 2020-01-29 DIAGNOSIS — I951 Orthostatic hypotension: Secondary | ICD-10-CM

## 2020-01-29 DIAGNOSIS — R531 Weakness: Secondary | ICD-10-CM | POA: Diagnosis not present

## 2020-01-29 DIAGNOSIS — Z7189 Other specified counseling: Secondary | ICD-10-CM

## 2020-01-29 DIAGNOSIS — R296 Repeated falls: Secondary | ICD-10-CM

## 2020-01-29 MED ORDER — CLOBETASOL PROPIONATE 0.05 % EX CREA: 1.0000 "application " | TOPICAL_CREAM | Freq: Two times a day (BID) | CUTANEOUS | 5 refills | Status: DC

## 2020-01-29 NOTE — Progress Notes (Signed)
Location:  [facility] clinic Provider:  Kymberlee Viger L. Renato Gails, D.O., C.M.D.  Code Status: DNR Goals of Care:  Advanced Directives 11/25/2019  Does Patient Have a Medical Advance Directive? Yes  Type of Estate agent of [place];Living will;Out of facility DNR (pink MOST or yellow form)  Does patient want to make changes to medical advance directive? No - Patient declined  Copy of Healthcare Power of Attorney in Chart? Yes - validated most recent copy scanned in chart (See row information)  Would patient like information on creating a medical advance directive? -  Pre-existing out of facility DNR order (yellow form or pink MOST form) -     Chief Complaint  Patient presents with  . Medical Management of Chronic Issues    4 month follow up and sign treatment agreement.     HPI: Patient is a 86 y.o. female seen today for medical management of chronic diseases.    Pt is 86 yo, lives in ALF and takes a little tramadol for pain given her frequent falls and baseline neck OA.    She is now confined to a wheelchair 6-8 wks--gets up to go to the bathroom, bed, chair.  She has to hold onto something all the time.  She would need PT to do that.    No recent orthostatic spells.  Only one fall since being in the wheelchair.  She had tried to pull her pants up in a standing position.    Hurt right hand and knee.    She had another UTI despite that she has been on low grade abx .  Probiotics for women and more water and f/u with Dr. Marcelle Overlie about lichen sclerosis recommended by NP at Dr. Thomos Lemons office.  She has a pain in her right arm once in a while.  Hurts to roll her wheelchair.  Whole arm.  Starts at her neck and goes to her fingers.  Cervicalgia.    Paper thin skin--is it age or blood thinners?  Explained to them.    They have the MOST form:  DNR, comfort measures, abx to be determined, no fluids or feeding tube.  Discussed with her kids and grandkids  Her intake has  declined.  She is trying to drink one boost per day.  They think it's related to a change in diet.  She has a little rattle in her chest also.  They do not want me to work this up.    Past Medical History:  Diagnosis Date  . Cervicalgia   . Chronic kidney disease   . Chronic kidney disease, stage III (moderate) (HCC)   . Dermatophytosis of groin and perianal area   . Dyspnea   . Dysrhythmia   . Hyperlipidemia LDL goal < 100   . Insomnia, unspecified   . Muscle weakness (generalized)   . Orthostatic hypotension   . Osteoarthrosis, unspecified whether generalized or localized, lower leg    osteoarthritis - upper thoracic area, left knee.  . Other abnormal glucose   . Other B-complex deficiencies   . Other malaise and fatigue   . Palpitations   . Senile cataract, unspecified   . Shortness of breath   . Stroke [facility])    small "stroke" - right sided weakness for a day.  . Stroke (HCC)   . Tachycardia, unspecified   . Undiagnosed cardiac murmurs   . Unspecified essential hypertension   . Unspecified vitamin D deficiency   . Urinary tract infection  Past Surgical History:  Procedure Laterality Date  . CATARACT EXTRACTION W/PHACO Bilateral 1994   Dr. Dione Booze  . CATARACT EXTRACTION, BILATERAL Bilateral   . EYE SURGERY     cataract  . RUPTURED GLOBE EXPLORATION AND REPAIR Left 09/03/2019   Procedure: Scleral laceration repair LEFT EYE, exam under anesthesia LEFT EYE;  Surgeon: Olivia Canter, MD;  Location: [facility] OR;  Service: Ophthalmology;  Laterality: Left;  . TOE AMPUTATION  2002  . TONSILLECTOMY  1944  . TONSILLECTOMY     younger yrs  . TOTAL KNEE ARTHROPLASTY Left 01/15/2015   Procedure: LEFT TOTAL KNEE ARTHROPLASTY;  Surgeon: Eugenia Mcalpine, MD;  Location: [facility] ORS;  Service: Orthopedics;  Laterality: Left;  . TUBAL LIGATION  1951    Allergies  Allergen Reactions  . Ace Inhibitors Cough  . Lisinopril Cough  . Statins Other (See Comments)    Body aches and pains     Outpatient Encounter Medications as of 01/29/2020  Medication Sig  . Acetaminophen 500 MG capsule Take 2 capsules (1,000 mg total) by mouth at bedtime.  Marland Kitchen b complex vitamins tablet Take 1 tablet by mouth daily.  . carvedilol (COREG) 6.25 MG tablet Take 6.25 mg by mouth 2 (two) times daily.  . cephALEXin (KEFLEX) 500 MG capsule Take 1 capsule (500 mg total) by mouth 3 (three) times daily.  . Cholecalciferol (VITAMIN D3) 10 MCG (400 UNIT) CAPS Take 400 Units by mouth daily.  . clobetasol cream (TEMOVATE) 0.05 % Apply 1 application topically 2 (two) times daily.  . clopidogrel (PLAVIX) 75 MG tablet Take 75 mg by mouth daily.  Marland Kitchen conjugated estrogens (PREMARIN) vaginal cream Place 1 Applicatorful vaginally daily.  . Cyanocobalamin (B-12 PO) Take 1 tablet by mouth daily.  . Multiple Vitamins-Minerals (PRESERVISION AREDS 2+MULTI VIT) CAPS Take 1 capsule by mouth 2 (two) times daily.   . nitrofurantoin (MACRODANTIN) 50 MG capsule Take 50 mg by mouth at bedtime.  . ondansetron (ZOFRAN) 4 MG tablet Take 4 mg by mouth.  . polyethylene glycol (MIRALAX / GLYCOLAX) 17 g packet Take 17 g by mouth as needed.  . pyridoxine (B-6) 100 MG tablet Take 100 mg by mouth as needed. For burning upon urination.  . sertraline (ZOLOFT) 25 MG tablet Take 1 tablet (25 mg total) by mouth daily. For anxiety and depression  . traMADol (ULTRAM) 50 MG tablet Take 1 tablet (50 mg total) by mouth 2 (two) times daily as needed for moderate pain (for pain).  . traZODone (DESYREL) 50 MG tablet Take 0.5 tablets (25 mg total) by mouth at bedtime.  Marland Kitchen VITAMIN D PO Take 1 tablet by mouth daily.  . [DISCONTINUED] clobetasol cream (TEMOVATE) 0.05 % Apply 1 application topically 2 (two) times daily.   No facility-administered encounter medications on file as of 01/29/2020.    Review of Systems:  Review of Systems  Constitutional: Positive for malaise/fatigue and weight loss. Negative for chills and fever.  HENT: Positive for  hearing loss. Negative for sore throat.   Eyes: Negative for blurred vision.  Respiratory: Negative for cough and shortness of breath.   Cardiovascular: Negative for chest pain, palpitations and leg swelling.  Gastrointestinal: Negative for abdominal pain and constipation.  Genitourinary:       Did not c/o her urinary symptoms today  Musculoskeletal: Positive for falls.  Skin: Negative for itching and rash.  Neurological: Negative for dizziness and loss of consciousness.  Endo/Heme/Allergies: Bruises/bleeds easily.  Psychiatric/Behavioral: Positive for memory loss (some recent decline ).  Health Maintenance  Topic Date Due  . INFLUENZA VACCINE  10/19/2019  . TETANUS/TDAP  12/15/2029  . COVID-19 Vaccine  Completed  . PNA vac Low Risk Adult  Completed  . DEXA SCAN  Addressed    Physical Exam: Vitals:   01/29/20 1310  Weight: 113 lb (51.3 kg)  Height: 5\' 7"  (1.702 m)   Body mass index is 17.7 kg/m. Physical Exam Vitals reviewed.  Constitutional:      General: She is not in acute distress.    Appearance: Normal appearance. She is not toxic-appearing.  HENT:     Head: Normocephalic and atraumatic.  Eyes:     Pupils: Pupils are equal, round, and reactive to light.     Comments: Some permanent darkening of left eye after trauma  Cardiovascular:     Rate and Rhythm: Normal rate and regular rhythm.     Pulses: Normal pulses.     Heart sounds: Normal heart sounds.  Pulmonary:     Effort: Pulmonary effort is normal.     Breath sounds: Normal breath sounds. No wheezing, rhonchi or rales.  Abdominal:     General: Bowel sounds are normal.  Musculoskeletal:        General: Normal range of motion.     Right lower leg: No edema.     Left lower leg: No edema.  Skin:    Comments: Multiple skin tears of lower legs that are bleeding and required new dressings  Neurological:     General: No focal deficit present.     Mental Status: She is alert and oriented to person, place, and  time.     Motor: Weakness present.     Gait: Gait abnormal.     Comments: Using wheelchair now  Psychiatric:     Comments: HOH and talks very loudly over others now especially her daughter     Labs reviewed: Basic Metabolic Panel: Recent Labs    06/05/19 0901 07/15/19 1335 09/04/19 0156 09/05/19 0242 11/25/19 1439  NA 142   < > 135 134* 141  K 4.2   < > 4.5 3.9 4.0  CL 107   < > 102 103 105  CO2 27   < > 23 24 27   GLUCOSE 109*   < > 160* 118* 129  BUN 27*   < > 19 23 24   CREATININE 1.14*   < > 0.90 1.14* 1.32*  CALCIUM 9.2   < > 7.9* 8.1* 8.9  MG  --   --   --  1.8  --   TSH 2.44  --   --   --   --    < > = values in this interval not displayed.   Liver Function Tests: Recent Labs    09/03/19 1400 09/05/19 0242 11/25/19 1439  AST 45* 17 14  ALT 202* 94* 11  ALKPHOS 162* 106  --   BILITOT 1.4* 0.9 0.9  PROT 5.9* 4.9* 5.6*  ALBUMIN 3.4* 2.7*  --    No results for input(s): LIPASE, AMYLASE in the last 8760 hours. No results for input(s): AMMONIA in the last 8760 hours. CBC: Recent Labs    06/05/19 0901 07/15/19 1335 09/03/19 1400 09/03/19 1400 09/04/19 0156 09/05/19 0242 11/25/19 1439  WBC 6.3   < > 5.9   < > 5.0 5.3 6.3  NEUTROABS 4,278  --  4.5  --   --   --  4,624  HGB 13.2   < > 11.0*   < >  10.6* 10.2* 11.2*  HCT 40.2   < > 34.9*   < > 33.2* 32.0* 33.8*  MCV 91.6   < > 97.2   < > 94.6 95.2 93.6  PLT 277   < > 217   < > 202 209 207   < > = values in this interval not displayed.   Lipid Panel: No results for input(s): CHOL, HDL, LDLCALC, TRIG, CHOLHDL, LDLDIRECT in the last 8760 hours. Lab Results  Component Value Date   HGBA1C 5.7 (H) 12/26/2018    Procedures since last visit: Intravitreal Injection, Pharmacologic Agent - OD - Right Eye  Result Date: 01/05/2020 Time Out 01/05/2020. 9:17 AM. Confirmed correct patient, procedure, site, and patient consented. Anesthesia Topical anesthesia was used. Anesthetic medications included Akten 3.5%.  Procedure Preparation included Tobramycin 0.3%, 10% betadine to eyelids, 5% betadine to ocular surface. A 30 gauge needle was used. Injection: 1.25 mg Bevacizumab (AVASTIN) SOLN   NDC: 70360-001-02   Route: Intravitreal, Site: Right Eye, Waste: 0 mg Post-op Post injection exam found visual acuity of at least counting fingers. The patient tolerated the procedure well. There were no complications. The patient received written and verbal post procedure care education. Post injection medications were not given.   OCT, Retina - OU - Both Eyes  Result Date: 01/05/2020 Right Eye Quality was good. Scan locations included subfoveal. Central Foveal Thickness: 271. Progression has been stable. Findings include cystoid macular edema. Left Eye Quality was good. Scan locations included subfoveal. Central Foveal Thickness: 379. Findings include subretinal hyper-reflective material, retinal drusen , no SRF, no IRF, vitreomacular adhesion . Notes Region of CME from superior branch retinal vein occlusion OD , Minor at 10-week interval post intravitreal Avastin.  We will repeat intravitreal Avastin OD for stabilization and examination OU in 3 months OS with large subfoveal drusenoid debris deposit unchanged over the last 9 to 12 months   Assessment/Plan 1. Lichen sclerosus - her cream was renewed, she'll follow-up with her gyn on this  - clobetasol cream (TEMOVATE) 0.05 %; Apply 1 application topically 2 (two) times daily.  Dispense: 60 g; Refill: 5  2. Senile purpura (HCC) -is tired of bleeding all of the time on plavix and has just the tiniest bump or abrasion and it won't stop bleeding  3. Genitourinary syndrome of menopause - per specialist gyn  - requires reminding that her dysuria and symptoms are somewhat chronic and not always infection--cont premarin cream, lichen sclerosis cream, constipation mgt, hydration -on macrodantin prophylaxis from urology also - Amb Referral to Palliative Care  4. Generalized  weakness - with recurrent falls related to orthostasis and poor hydration at 96, is on coreg but bp here 160/70 so maintained this -requests therapy to see if she can get back to ambulating safely with a walker since she's been using a wheelchair most [facility] - Amb Referral to Palliative Care due to goals of care being comfort-based for a long time now (as long as I've known her which is about 10 yrs)  5. Orthostatic syncope - multifactorial--poor fluid intake, getting up or changing positions too quickly, frailty overall, beta blocker, could also be due in part to meds, plus some falls are mechanical not syncopal for her (vision no longer as good with macular and left eye trauma) - Amb Referral to Palliative Care  6. Unsteady gait -PT eval and tx at [facility] per their request--see if she can make progress since walking helps her qol - Amb Referral to Palliative Care  7. Recurrent falls - as above in orthostatic hypotension - Amb Referral to Palliative Care  8. Weight loss -appetite is less, is growing more frail, encouraged hydration, protein supplements -advanced age runs in her family, but I am noting changes in her the past year c/w frailty -supposedly she was 138 last visit two mos ago and 113 this time; however, she's been dropping weight since July and had at a previous time been in the 120s during acute illness - Amb Referral to Palliative Care  ADVANCE CARE PLANNING:  17 mins spent on this today--signed her MOST.  She has a MOST form, DNR, living will and HCPOA in place.  I referred her to palliative care for increased support in the AL setting and to help prevent hospitalizations despite her MOST.  We previously did her goals of care form, as well.  MOST copy is not yet uploaded into vynca.    Labs/tests ordered:  Lab Orders  No laboratory test(s) ordered today   Next appt:  07/13/2020  Chailyn Racette L. Estaban [place], D.O. Geriatrics [facility] [address] [place], [place] [address] Cell Phone (Mon-Fri 8am-5pm):  [phone] On Call:  [phone] & follow prompts after 5pm & weekends Office Phone:  [phone] Office Fax:  [phone]

## 2020-01-30 ENCOUNTER — Telehealth: Payer: Self-pay

## 2020-01-30 NOTE — Telephone Encounter (Signed)
Lisa [name], daughter, called inquiring about PT for patient. Dr. Renato Gails stated she needed to sign some paperwork from [facility] to order the therapy and we can call [facility] to request the form.   VM left for [facility] [phone]) to return call, was unable to speak to anyone after multiple attempts.

## 2020-02-02 ENCOUNTER — Telehealth: Payer: Self-pay | Admitting: *Deleted

## 2020-02-02 DIAGNOSIS — Z87440 Personal history of urinary (tract) infections: Secondary | ICD-10-CM | POA: Diagnosis not present

## 2020-02-02 NOTE — Telephone Encounter (Signed)
B12 1000 mcg po daily

## 2020-02-02 NOTE — Telephone Encounter (Signed)
Raeanne Gathers notified and agreed.  Medication list updated.

## 2020-02-02 NOTE — Telephone Encounter (Signed)
Raeanne Gathers with [facility] called and stated that patient was seen last week. Stated that the medication list did not state How much Vitamin B12 the patient is suppose to be taking.  Needs the Strength of how much patient should be getting daily.   Please Advise.    Cyanocobalamin (B-12 PO) 1 tablet, Daily Take 1 tablet by mouth daily.

## 2020-02-03 ENCOUNTER — Telehealth: Payer: Self-pay | Admitting: *Deleted

## 2020-02-03 NOTE — Telephone Encounter (Signed)
LMOM to return call.

## 2020-02-03 NOTE — Telephone Encounter (Signed)
Patient called and stated that she wants to come off of Plavix. Stated that she is tired of bleeding every time she touches herself. Patient stated that she rather die of a Heart Attack than bleed to death.  Requesting to discontinue the Plavix.   Please Advise.

## 2020-02-03 NOTE — Telephone Encounter (Signed)
Coming off the plavix will also increase her stroke risk, but I do understand her desire to come off the plavix.  As long as she is aware of those risks, this is reasonable with her goals of care being comfort-based.  So, OK to stop plavix.

## 2020-02-04 ENCOUNTER — Other Ambulatory Visit: Payer: Self-pay | Admitting: Internal Medicine

## 2020-02-04 NOTE — Telephone Encounter (Signed)
Patient notified and agreed. Stated that she understands the risks and still wants to STOP the medication Plavix.   Medication list updated.

## 2020-02-05 ENCOUNTER — Telehealth: Payer: Self-pay

## 2020-02-05 DIAGNOSIS — N3281 Overactive bladder: Secondary | ICD-10-CM | POA: Diagnosis not present

## 2020-02-05 DIAGNOSIS — N3020 Other chronic cystitis without hematuria: Secondary | ICD-10-CM | POA: Diagnosis not present

## 2020-02-05 NOTE — Telephone Encounter (Signed)
Spoke with patient's daughter to change appointment from [place] NP to [place] NP on facility day. New appointment scheduled for 02/26/20. That was the soonest facility day appointment that daughter could attend.

## 2020-02-05 NOTE — Telephone Encounter (Signed)
Spoke with patient's daughter Olegario Messier and have scheduled an In-person Consult for 02/18/20 @  8:30AM.  COVID screening was negative. No pets in home. [place] [facility].  Patient's daughter will be at consult appointment.   Consent obtained; updated Outlook/Netsmart/Team List and Epic.

## 2020-02-18 ENCOUNTER — Other Ambulatory Visit: Payer: Self-pay | Admitting: Internal Medicine

## 2020-02-18 DIAGNOSIS — R55 Syncope and collapse: Secondary | ICD-10-CM | POA: Diagnosis not present

## 2020-02-18 DIAGNOSIS — Z9181 History of falling: Secondary | ICD-10-CM | POA: Diagnosis not present

## 2020-02-18 DIAGNOSIS — L900 Lichen sclerosus et atrophicus: Secondary | ICD-10-CM | POA: Diagnosis not present

## 2020-02-18 DIAGNOSIS — M6282 Rhabdomyolysis: Secondary | ICD-10-CM | POA: Diagnosis not present

## 2020-02-19 NOTE — Telephone Encounter (Signed)
error 

## 2020-02-26 ENCOUNTER — Other Ambulatory Visit: Payer: Self-pay

## 2020-02-26 ENCOUNTER — Telehealth: Payer: Self-pay | Admitting: *Deleted

## 2020-02-26 ENCOUNTER — Other Ambulatory Visit: Payer: Medicare Other | Admitting: Nurse Practitioner

## 2020-02-26 NOTE — Telephone Encounter (Signed)
Patient daughter called and stated that she just wanted to let you know that Dr. Arita Miss is discontinuing patient's Medication, Gemtesa.   FYI

## 2020-03-08 ENCOUNTER — Telehealth: Payer: Self-pay

## 2020-03-08 NOTE — Telephone Encounter (Signed)
Spoke with patient and rescheduled an in-person Palliative Consult for 03/17/20 @ 8:30AM  COVID screening was negative. No pets in home. [place] lives [place] at [place]  Consent obtained; updated Outlook/Netsmart/Team List and Epic.

## 2020-03-17 ENCOUNTER — Encounter: Payer: Medicare Other | Admitting: Nurse Practitioner

## 2020-03-20 DIAGNOSIS — M6282 Rhabdomyolysis: Secondary | ICD-10-CM | POA: Diagnosis not present

## 2020-03-20 DIAGNOSIS — Z9181 History of falling: Secondary | ICD-10-CM | POA: Diagnosis not present

## 2020-03-20 DIAGNOSIS — R55 Syncope and collapse: Secondary | ICD-10-CM | POA: Diagnosis not present

## 2020-04-01 ENCOUNTER — Non-Acute Institutional Stay: Payer: Medicare Other | Admitting: Nurse Practitioner

## 2020-04-01 ENCOUNTER — Other Ambulatory Visit: Payer: Self-pay

## 2020-04-01 DIAGNOSIS — R296 Repeated falls: Secondary | ICD-10-CM

## 2020-04-01 DIAGNOSIS — Z515 Encounter for palliative care: Secondary | ICD-10-CM | POA: Diagnosis not present

## 2020-04-01 NOTE — Progress Notes (Signed)
[facility] Consult Note Telephone: [phone]  Fax: [phone]  PATIENT NAME: Lisa [name] [id number] [phone] (home)  DOB: 1921-05-22 MRN: [id number]  PRIMARY CARE PROVIDER:    Gayland Curry, DO,  [place] [address] [phone]  REFERRING PROVIDER:   Gayland Curry, DO [place],  [place] [address] [phone]  RESPONSIBLE PARTY:   Extended Emergency Contact Information Primary Emergency Contact: Smith,Kathy Address: [address] [place], [place] [address] Johnnette Litter of [place] Phone: [phone] Mobile Phone: [phone] Relation: Daughter Secondary Emergency Contact: Festus Holts, [place] of [place] Phone: [phone] Relation: Friend  I met face to face with patient and daughter [name] in facility.   ASSESSMENT AND RECOMMENDATIONS:   Advance Care Planning: Today's visit consisted of building trust and discussions on Palliative care medicine asa  specialized medical care for people living with serious illness, aimed at facilitating improved quality of life through symptoms relief, assisting with advance care planning and establishing goals of care. Patient and daughter [name] expressed appreciation for education provided on Palliative care and how it differs from Hospice care service. Palliative care will continue to provide support to patient, family and the medical team.  Goal of care: Patient's goal of care is comfort. Patient verbalized desire to be comfortable and be free of pain as she lives out the rest of her life. Directives: Patient has a signed DNR and MOST form on her chart in the facility and on [facility] EMR. Details of MOST form include, DNR/no CPR, comfort measures, determine use and limitation of antibiotics when infection occurs, no IV fluids, no feeding tubes. Reviewed  MOST form elections with patient and her daughter. Patient confirmed election with no change made today.   Symptom Management:  Numbeness: Patient with complaints of numbness and tingling to bilateral toes and fingers. Denied burning pain, report having just tingling. Report symptoms started about 2-3 weeks ago. Patient has not used anything for it, reports nothing makes it better. Patient not diabetic, on Cyanocobalamin 1026m for vit B deficiency. Recommendation: Patient would benefit from further work up, consider checking vit B 12 level, reported a remote history of diabetes, will reach out to her PCP.  High blood pressure: Blood pressure elevated on exam today. Patient on Coreg 6.227mtwice a day. Patient denied headache, denied edema, denied increased SOB. Patient endorsed ability to check BPs, has a blood pressure monitoring device. Patient and daughter voiced no other concerns today.  Recommendation: Recommend patient check her blood pressure twice a day, she is to call if SBP is consistently greater than 150 or SBP >180. May consider adding Amlodipine to regimen. Addendum 04/02/2020 Called Dr. ReCyndi Lennertffice, left message with AlElmo Puttor Dr. ReMariea Clontsegarding patient's symptoms.  Follow up Palliative Care Visit: Palliative care will continue to follow for goals of care clarification and symptom management. Return in about 4 weeks or prn.  Family /Caregiver/Community Supports:  Patient lives in an assisted living. She has 3 living children, lost a son 2 years ago. She is the last child of 10 children. Daughter KaJuliann Pulses her HCPOA and very involved in her care.   Cognitive / Functional decline: Patient is hard of hearing. She is awake, alert and communicative, baths self and dresses self. Able to self  transfer to wheel chair, comode and bed. Wheelchair dependent for ambulation. Last fall was last month.   I spent 60 minutes providing this consultation, time includes time spent with patient and  daughter, chart review, provider coordination, and documentation. More than 50% of the time in this consultation was spent counseling and coordinating communication.   CHIEF COMPLAINT: numbness and tingling in bilateral fingers and toes  HISTORY OF PRESENT ILLNESS:  Lisa [name] is a 87 y.o. year old female with multiple medical problems including Lichen Sclerosis (on Estrogen cream and Clobetasol cream), depression, Insomnia, Osteoarthritis of neck, CKD 3, HTN, hx frequent UTI (followed by Urology Dr. Claudia Desanctis, on Probiotics and daily Cranberry extract, no longer taking Macrodantin), hx of CVA ( patient stopped taking Plavix due to bleeding issues). Palliative Care was asked to follow this patient by consultation request of Gayland Curry, DO to help address advance care planning and goals of care. This is an initial visit.  CODE STATUS: DNR  PPS: 50%  HOSPICE ELIGIBILITY/DIAGNOSIS: TBD  PHYSICAL EXAM / ROS:   BP 162/92 (left arm), 150/90 (right arm), P 78, 95% on room air Current and past weights: 141lbs , Ht 34f7", BMI 22.1kg/m2 General:  frail appearing, sitting in recliner in her room in NAD Cardiovascular: denied chest pain, no edema, S1S2 normal Pulmonary: no cough, no increased SOB, room air GI: no report of swallowing issues, appetite fair, denied constipation, continent of bowel GU: denies dysuria, continent of urine MSK: no joint and ROM abnormalities Skin: no rashes or wounds reported Neurological: Weakness, but otherwise nonfocal Psych: non -anxious affect  PAST MEDICAL HISTORY:  Past Medical History:  Diagnosis Date  . Cervicalgia   . Chronic kidney disease   . Chronic kidney disease, stage III (moderate) (HCC)   . Dermatophytosis of groin and perianal area   . Dyspnea   . Dysrhythmia   . Hyperlipidemia LDL goal < 100   . Insomnia, unspecified   . Muscle weakness (generalized)   . Orthostatic hypotension   . Osteoarthrosis, unspecified whether generalized or  localized, lower leg    osteoarthritis - upper thoracic area, left knee.  . Other abnormal glucose   . Other B-complex deficiencies   . Other malaise and fatigue   . Palpitations   . Senile cataract, unspecified   . Shortness of breath   . Stroke ([facility]    small "stroke" - right sided weakness for a day.  . [place]   . Tachycardia, unspecified   . Undiagnosed cardiac murmurs   . Unspecified essential hypertension   . Unspecified vitamin D deficiency   . Urinary tract infection     SOCIAL HX:  Social History   Tobacco Use  . Smoking status: Never Smoker  . Smokeless tobacco: Never Used  Substance Use Topics  . Alcohol use: Yes    Comment: rare social every 3 or 4 months wine   FAMILY HX:  Family History  Problem Relation Age of Onset  . Cerebral aneurysm Mother   . Heart attack Father   . Alzheimer's disease Sister   . Emphysema Sister   . Stroke Sister   . Alzheimer's disease Sister   . Arthritis Sister   . Stroke Sister        Age 85  . Lung cancer Son     ALLERGIES:  Allergies  Allergen Reactions  . Ace Inhibitors Cough  . Lisinopril Cough  . Statins Other (See Comments)    Body aches  and pains     PERTINENT MEDICATIONS:  Outpatient Encounter Medications as of 04/01/2020  Medication Sig  . Acetaminophen 500 MG capsule Take 2 capsules (1,000 mg total) by mouth at bedtime.  . carvedilol (COREG) 6.25 MG tablet Take 6.25 mg by mouth 2 (two) times daily.  . Cholecalciferol (VITAMIN D3) 10 MCG (400 UNIT) CAPS Take 400 Units by mouth daily.  . clobetasol cream (TEMOVATE) 2.13 % Apply 1 application topically 2 (two) times daily.  Marland Kitchen conjugated estrogens (PREMARIN) vaginal cream Place 1 Applicatorful vaginally daily.  . Cyanocobalamin 1000 MCG CAPS Take one capsule by mouth once daily.  . Multiple Vitamins-Minerals (PRESERVISION AREDS 2+MULTI VIT) CAPS Take 1 capsule by mouth 2 (two) times daily.   . nitrofurantoin (MACRODANTIN) 50 MG capsule Take 50 mg by  mouth at bedtime.  . ondansetron (ZOFRAN) 4 MG tablet Take 4 mg by mouth.  . polyethylene glycol (MIRALAX / GLYCOLAX) 17 g packet Take 17 g by mouth as needed.  . pyridoxine (B-6) 100 MG tablet Take 100 mg by mouth as needed. For burning upon urination.  . sertraline (ZOLOFT) 25 MG tablet Take 1 tablet (25 mg total) by mouth daily. For anxiety and depression  . traMADol (ULTRAM) 50 MG tablet Take 1 tablet (50 mg total) by mouth 2 (two) times daily as needed for moderate pain (for pain).  . traZODone (DESYREL) 50 MG tablet Take 0.5 tablets (25 mg total) by mouth at bedtime.   No facility-administered encounter medications on file as of 04/01/2020.    Thank you for the opportunity to participate in the care of Lisa [name]. The palliative care team will continue to follow. Please call our office at [phone] if we can be of additional assistance.  Jari Favre, DNP, AGPCNP-BC

## 2020-04-02 ENCOUNTER — Telehealth: Payer: Self-pay | Admitting: *Deleted

## 2020-04-02 NOTE — Telephone Encounter (Signed)
Reuel Boom, NP with Palliative Care called and wanted to let you know she saw patient yesterday and patient was complaining of Numbness and Tingling in bilateral hands and Toes for about 2-3 weeks. Also having trouble with fine motor skills.  Has upcoming appointment on 06/03/2020 with Dr. Juluis Pitch

## 2020-04-06 ENCOUNTER — Encounter (INDEPENDENT_AMBULATORY_CARE_PROVIDER_SITE_OTHER): Payer: Medicare Other | Admitting: Ophthalmology

## 2020-04-08 ENCOUNTER — Other Ambulatory Visit: Payer: Self-pay

## 2020-04-08 ENCOUNTER — Ambulatory Visit (INDEPENDENT_AMBULATORY_CARE_PROVIDER_SITE_OTHER): Payer: Medicare Other | Admitting: Internal Medicine

## 2020-04-08 ENCOUNTER — Encounter: Payer: Self-pay | Admitting: Internal Medicine

## 2020-04-08 VITALS — BP 138/82 | HR 58 | Temp 97.3°F | Ht 67.0 in | Wt 141.1 lb

## 2020-04-08 DIAGNOSIS — N958 Other specified menopausal and perimenopausal disorders: Secondary | ICD-10-CM

## 2020-04-08 DIAGNOSIS — D692 Other nonthrombocytopenic purpura: Secondary | ICD-10-CM

## 2020-04-08 DIAGNOSIS — G629 Polyneuropathy, unspecified: Secondary | ICD-10-CM

## 2020-04-08 DIAGNOSIS — R739 Hyperglycemia, unspecified: Secondary | ICD-10-CM

## 2020-04-08 DIAGNOSIS — E538 Deficiency of other specified B group vitamins: Secondary | ICD-10-CM | POA: Diagnosis not present

## 2020-04-08 MED ORDER — GABAPENTIN 100 MG PO CAPS: 100.0000 mg | ORAL_CAPSULE | Freq: Every day | ORAL | 11 refills | Status: DC

## 2020-04-08 NOTE — Progress Notes (Signed)
Location:  [facility] clinic Provider:  Velmer Broadfoot L. Renato Gails, D.O., C.M.D.  Code Status:  DNR Goals of Care:  Advanced Directives 04/08/2020  Does Patient Have a Medical Advance Directive? Yes  Type of Estate agent of [place];Out of facility DNR (pink MOST or yellow form);Living will  Does patient want to make changes to medical advance directive? No - Patient declined  Copy of Healthcare Power of Attorney in Chart? Yes - validated most recent copy scanned in chart (See row information)  Would patient like information on creating a medical advance directive? -  Pre-existing out of facility DNR order (yellow form or pink MOST form) Pink MOST/Yellow Form most recent copy in chart - Physician notified to receive inpatient order   Chief Complaint  Patient presents with  . Acute Visit    Numbness in finger and toes x 2 or 3 weeks. Discuss frequent urination, she's up every 2 hours.      HPI: Patient is a 87 y.o. [name] seen today for acute visit for numbness of fingers and toes for 2-3 weeks. It started as tingling in the ends of her fingers and is moving up into her hands.  Right arm also ache from neck up and whole arm aches.  She pulled a toenail off without knowing.  She has trouble holding things and writing.    113 lbs was wrong b/c she was 138 at [facility] the next day.  Left third toe with missing nail and second with medial abrasion--this is the foot where she'd had an accident and lost her great toe.  She also continues to have urinary frequency--up every 2-3 hrs. No recent UTI issues.    Past Medical History:  Diagnosis Date  . Cervicalgia   . Chronic kidney disease   . Chronic kidney disease, stage III (moderate) (HCC)   . Dermatophytosis of groin and perianal area   . Dyspnea   . Dysrhythmia   . Hyperlipidemia LDL goal < 100   . Insomnia, unspecified   . Muscle weakness (generalized)   . Orthostatic hypotension   . Osteoarthrosis, unspecified whether  generalized or localized, lower leg    osteoarthritis - upper thoracic area, left knee.  . Other abnormal glucose   . Other B-complex deficiencies   . Other malaise and fatigue   . Palpitations   . Senile cataract, unspecified   . Shortness of breath   . Stroke [facility])    small "stroke" - right sided weakness for a day.  . Stroke (HCC)   . Tachycardia, unspecified   . Undiagnosed cardiac murmurs   . Unspecified essential hypertension   . Unspecified vitamin D deficiency   . Urinary tract infection     Past Surgical History:  Procedure Laterality Date  . CATARACT EXTRACTION W/PHACO Bilateral 1994   Dr. Dione Booze  . CATARACT EXTRACTION, BILATERAL Bilateral   . EYE SURGERY     cataract  . RUPTURED GLOBE EXPLORATION AND REPAIR Left 09/03/2019   Procedure: Scleral laceration repair LEFT EYE, exam under anesthesia LEFT EYE;  Surgeon: Olivia Canter, MD;  Location: [facility] OR;  Service: Ophthalmology;  Laterality: Left;  . TOE AMPUTATION  2002  . TONSILLECTOMY  1944  . TONSILLECTOMY     younger yrs  . TOTAL KNEE ARTHROPLASTY Left 01/15/2015   Procedure: LEFT TOTAL KNEE ARTHROPLASTY;  Surgeon: Eugenia Mcalpine, MD;  Location: [facility] ORS;  Service: Orthopedics;  Laterality: Left;  . TUBAL LIGATION  1951    Allergies  Allergen  Reactions  . Ace Inhibitors Cough  . Lisinopril Cough  . Statins Other (See Comments)    Body aches and pains    Outpatient Encounter Medications as of 04/08/2020  Medication Sig  . Acetaminophen 500 MG capsule Take 2 capsules (1,000 mg total) by mouth at bedtime.  . carvedilol (COREG) 6.25 MG tablet Take 6.25 mg by mouth 2 (two) times daily.  . Cholecalciferol (VITAMIN D3) 10 MCG (400 UNIT) CAPS Take 400 Units by mouth daily.  . clobetasol cream (TEMOVATE) 0.05 % Apply 1 application topically 2 (two) times daily.  Marland Kitchen conjugated estrogens (PREMARIN) vaginal cream Place 1 Applicatorful vaginally daily.  . Cyanocobalamin 1000 MCG CAPS Take one capsule by mouth once  daily.  . Multiple Vitamins-Minerals (PRESERVISION AREDS 2+MULTI VIT) CAPS Take 1 capsule by mouth 2 (two) times daily.   . nitrofurantoin (MACRODANTIN) 50 MG capsule Take 50 mg by mouth at bedtime.  . ondansetron (ZOFRAN) 4 MG tablet Take 4 mg by mouth.  . polyethylene glycol (MIRALAX / GLYCOLAX) 17 g packet Take 17 g by mouth as needed.  . pyridoxine (B-6) 100 MG tablet Take 100 mg by mouth as needed. For burning upon urination.  . sertraline (ZOLOFT) 25 MG tablet Take 1 tablet (25 mg total) by mouth daily. For anxiety and depression  . traMADol (ULTRAM) 50 MG tablet Take 1 tablet (50 mg total) by mouth 2 (two) times daily as needed for moderate pain (for pain).  . traZODone (DESYREL) 50 MG tablet Take 0.5 tablets (25 mg total) by mouth at bedtime.   No facility-administered encounter medications on file as of 04/08/2020.    Review of Systems:  Review of Systems  Constitutional: Negative for chills, fever and malaise/fatigue.  HENT: Negative for congestion and sore throat.   Eyes: Negative for blurred vision.       Prior left eye trauma  Respiratory: Negative for cough and shortness of breath.   Cardiovascular: Negative for chest pain, palpitations and leg swelling.  Gastrointestinal: Negative for abdominal pain, blood in stool, constipation, diarrhea and melena.  Genitourinary: Positive for frequency and urgency. Negative for dysuria.       Nocturia  Musculoskeletal: Negative for falls.  Neurological: Positive for tingling and sensory change. Negative for dizziness and loss of consciousness.  Endo/Heme/Allergies: Bruises/bleeds easily.       Bruising much improved  Psychiatric/Behavioral: Negative for depression and memory loss. The patient has insomnia. The patient is not nervous/anxious.     Health Maintenance  Topic Date Due  . COVID-19 Vaccine (4 - Booster for Pfizer series) 06/29/2020  . TETANUS/TDAP  12/15/2029  . INFLUENZA VACCINE  Completed  . PNA vac Low Risk Adult   Completed  . DEXA SCAN  Addressed    Physical Exam: Vitals:   04/08/20 1008  BP: 138/82  Pulse: (!) 58  Temp: (!) 97.3 F (36.3 C)  SpO2: 96%  Weight: 141 lb 1.6 oz (64 kg)  Height: 5\' 7"  (1.702 m)   Body mass index is 22.1 kg/m. Physical Exam Vitals reviewed.  Constitutional:      Appearance: Normal appearance.  Cardiovascular:     Rate and Rhythm: Normal rate and regular rhythm.  Pulmonary:     Effort: Pulmonary effort is normal.     Breath sounds: Normal breath sounds.  Abdominal:     General: Bowel sounds are normal.  Neurological:     Mental Status: She is alert.     Labs reviewed: Basic Metabolic Panel: Recent Labs  06/05/19 0901 07/15/19 1335 09/04/19 0156 09/05/19 0242 11/25/19 1439  NA 142   < > 135 134* 141  K 4.2   < > 4.5 3.9 4.0  CL 107   < > 102 103 105  CO2 27   < > 23 24 27   GLUCOSE 109*   < > 160* 118* 129  BUN 27*   < > 19 23 24   CREATININE 1.14*   < > 0.90 1.14* 1.32*  CALCIUM 9.2   < > 7.9* 8.1* 8.9  MG  --   --   --  1.8  --   TSH 2.44  --   --   --   --    < > = values in this interval not displayed.   Liver Function Tests: Recent Labs    09/03/19 1400 09/05/19 0242 11/25/19 1439  AST 45* 17 14  ALT 202* 94* 11  ALKPHOS 162* 106  --   BILITOT 1.4* 0.9 0.9  PROT 5.9* 4.9* 5.6*  ALBUMIN 3.4* 2.7*  --    No results for input(s): LIPASE, AMYLASE in the last 8760 hours. No results for input(s): AMMONIA in the last 8760 hours. CBC: Recent Labs    06/05/19 0901 07/15/19 1335 09/03/19 1400 09/04/19 0156 09/05/19 0242 11/25/19 1439  WBC 6.3   < > 5.9 5.0 5.3 6.3  NEUTROABS 4,278  --  4.5  --   --  4,624  HGB 13.2   < > 11.0* 10.6* 10.2* 11.2*  HCT 40.2   < > 34.9* 33.2* 32.0* 33.8*  MCV 91.6   < > 97.2 94.6 95.2 93.6  PLT 277   < > 217 202 209 207   < > = values in this interval not displayed.   Lipid Panel: No results for input(s): CHOL, HDL, LDLCALC, TRIG, CHOLHDL, LDLDIRECT in the last 8760 hours. Lab Results   Component Value Date   HGBA1C 5.7 (H) 12/26/2018    Procedures since last visit: No results found.  Assessment/Plan 1. Neuropathy - suspect radiculopathy from her neck primarily plus she's had prior stroke and also had much higher glucose readings in the past -last b12 was ok 3/21 on same dose as current - Hemoglobin A1c - Vitamin B12 - gabapentin (NEURONTIN) 100 MG capsule; Take 1 capsule (100 mg total) by mouth at bedtime.  Dispense: 30 capsule; Refill: 11  2. Senile purpura (HCC) -persistent but improved bleeding with d/c'd plavix  3. Genitourinary syndrome of menopause -ongoing, but no recent UTIs fortunately and not c/o symptoms like this today  4. Hyperglycemia - f/u lab due to neuropathic pain - Hemoglobin A1c  Labs/tests ordered:  * No order type specified * Next appt:  06/03/2020   Carmel Waddington L. Jabe Jeanbaptiste, D.O. Geriatrics 4/21 [facility] [address], [address] [place] Cell Phone (Mon-Fri 8am-5pm):  [phone] On Call:  [phone] & follow prompts after 5pm & weekends Office Phone:  [phone] Office Fax:  [phone]

## 2020-04-09 LAB — HEMOGLOBIN A1C
Hgb A1c MFr Bld: 5.4 % of total Hgb (ref <5.7)
Mean Plasma Glucose: 108 mg/dL
eAG (mmol/L): 6 mmol/L

## 2020-04-09 LAB — VITAMIN B12: Vitamin B-12: 1251 pg/mL — ABNORMAL HIGH (ref 200–1100)

## 2020-04-12 ENCOUNTER — Other Ambulatory Visit: Payer: Self-pay | Admitting: *Deleted

## 2020-04-12 DIAGNOSIS — M5030 Other cervical disc degeneration, unspecified cervical region: Secondary | ICD-10-CM

## 2020-04-12 MED ORDER — TRAMADOL HCL 50 MG PO TABS: 50.0000 mg | ORAL_TABLET | Freq: Two times a day (BID) | ORAL | 1 refills | Status: DC | PRN

## 2020-04-12 NOTE — Telephone Encounter (Signed)
Dondra Spry with [facility] called requesting refill on patient's Tramadol.  Contract on File.  Requesting to be faxed to [facility] and sent to [facility] for approval. (Dr. Renato Gails out of office)

## 2020-04-19 ENCOUNTER — Other Ambulatory Visit: Payer: Self-pay

## 2020-04-19 ENCOUNTER — Ambulatory Visit (INDEPENDENT_AMBULATORY_CARE_PROVIDER_SITE_OTHER): Payer: Medicare Other | Admitting: Ophthalmology

## 2020-04-19 ENCOUNTER — Encounter (INDEPENDENT_AMBULATORY_CARE_PROVIDER_SITE_OTHER): Payer: Self-pay | Admitting: Ophthalmology

## 2020-04-19 ENCOUNTER — Encounter (INDEPENDENT_AMBULATORY_CARE_PROVIDER_SITE_OTHER): Payer: Medicare Other | Admitting: Ophthalmology

## 2020-04-19 DIAGNOSIS — H353122 Nonexudative age-related macular degeneration, left eye, intermediate dry stage: Secondary | ICD-10-CM

## 2020-04-19 DIAGNOSIS — H34831 Tributary (branch) retinal vein occlusion, right eye, with macular edema: Secondary | ICD-10-CM

## 2020-04-19 MED ORDER — BEVACIZUMAB 2.5 MG/0.1ML IZ SOSY
2.5000 mg | PREFILLED_SYRINGE | INTRAVITREAL | Status: AC | PRN
  Administered 2020-04-19: 2.5 mg via INTRAVITREAL

## 2020-04-19 NOTE — Assessment & Plan Note (Signed)
Increasing subfoveal drusenoid deposit left eye of concern for potential progression to wet ARMD will continue to monitor and observe

## 2020-04-19 NOTE — Progress Notes (Signed)
04/19/2020     CHIEF COMPLAINT Patient presents for Retina Follow Up (3 MO FU OD, POSS AVASTIN OD //Pt reports stable vision OD. Pt denies any new F/F, pain, or pressure OD./)   HISTORY OF PRESENT ILLNESS: Lisa [name] is a 87 y.o. female who presents to the clinic today for:   HPI    Retina Follow Up    Patient presents with  CRVO/BRVO.  In right eye.  This started 3 months ago.  Duration of 3 months.  Since onset it is stable. Additional comments: 3 MO FU OD, POSS AVASTIN OD   Pt reports stable vision OD. Pt denies any new F/F, pain, or pressure OD.        Last edited by Varney Biles D on 04/19/2020  9:55 AM. (History)      Referring physician: Kermit Balo, DO [address] [place],  [place] [address]  HISTORICAL INFORMATION:   Selected notes from the MEDICAL RECORD NUMBER    Lab Results  Component Value Date   HGBA1C 5.4 04/08/2020     CURRENT MEDICATIONS: No current outpatient medications on file. (Ophthalmic Drugs)   No current facility-administered medications for this visit. (Ophthalmic Drugs)   Current Outpatient Medications (Other)  Medication Sig  . Acetaminophen 500 MG capsule Take 2 capsules (1,000 mg total) by mouth at bedtime.  . carvedilol (COREG) 6.25 MG tablet Take 6.25 mg by mouth 2 (two) times daily.  . Cholecalciferol (VITAMIN D3) 10 MCG (400 UNIT) CAPS Take 400 Units by mouth daily.  . clobetasol cream (TEMOVATE) 0.05 % Apply 1 application topically 2 (two) times daily.  Marland Kitchen conjugated estrogens (PREMARIN) vaginal cream Place 1 Applicatorful vaginally daily.  . Cyanocobalamin 1000 MCG CAPS Take one capsule by mouth once daily.  Marland Kitchen gabapentin (NEURONTIN) 100 MG capsule Take 1 capsule (100 mg total) by mouth at bedtime.  . Multiple Vitamins-Minerals (PRESERVISION AREDS 2+MULTI VIT) CAPS Take 1 capsule by mouth 2 (two) times daily.   . nitrofurantoin (MACRODANTIN) 50 MG capsule Take 50 mg by mouth at bedtime.  . ondansetron (ZOFRAN) 4 MG  tablet Take 4 mg by mouth.  . polyethylene glycol (MIRALAX / GLYCOLAX) 17 g packet Take 17 g by mouth as needed.  . pyridoxine (B-6) 100 MG tablet Take 100 mg by mouth as needed. For burning upon urination.  . sertraline (ZOLOFT) 25 MG tablet Take 1 tablet (25 mg total) by mouth daily. For anxiety and depression  . traMADol (ULTRAM) 50 MG tablet Take 1 tablet (50 mg total) by mouth 2 (two) times daily as needed for moderate pain (for pain).  . traZODone (DESYREL) 50 MG tablet Take 0.5 tablets (25 mg total) by mouth at bedtime.   No current facility-administered medications for this visit. (Other)      REVIEW OF SYSTEMS:    ALLERGIES Allergies  Allergen Reactions  . Ace Inhibitors Cough  . Lisinopril Cough  . Statins Other (See Comments)    Body aches and pains    PAST MEDICAL HISTORY Past Medical History:  Diagnosis Date  . Cervicalgia   . Chronic kidney disease   . Chronic kidney disease, stage III (moderate) (HCC)   . Dermatophytosis of groin and perianal area   . Dyspnea   . Dysrhythmia   . Hyperlipidemia LDL goal < 100   . Insomnia, unspecified   . Muscle weakness (generalized)   . Orthostatic hypotension   . Osteoarthrosis, unspecified whether generalized or localized, lower leg  osteoarthritis - upper thoracic area, left knee.  . Other abnormal glucose   . Other B-complex deficiencies   . Other malaise and fatigue   . Palpitations   . Senile cataract, unspecified   . Shortness of breath   . Stroke [facility])    small "stroke" - right sided weakness for a day.  . Stroke (HCC)   . Tachycardia, unspecified   . Undiagnosed cardiac murmurs   . Unspecified essential hypertension   . Unspecified vitamin D deficiency   . Urinary tract infection    Past Surgical History:  Procedure Laterality Date  . CATARACT EXTRACTION W/PHACO Bilateral 1994   Dr. Dione Booze  . CATARACT EXTRACTION, BILATERAL Bilateral   . EYE SURGERY     cataract  . RUPTURED GLOBE EXPLORATION AND  REPAIR Left 09/03/2019   Procedure: Scleral laceration repair LEFT EYE, exam under anesthesia LEFT EYE;  Surgeon: Olivia Canter, MD;  Location: [facility] OR;  Service: Ophthalmology;  Laterality: Left;  . TOE AMPUTATION  2002  . TONSILLECTOMY  1944  . TONSILLECTOMY     younger yrs  . TOTAL KNEE ARTHROPLASTY Left 01/15/2015   Procedure: LEFT TOTAL KNEE ARTHROPLASTY;  Surgeon: Eugenia Mcalpine, MD;  Location: [facility] ORS;  Service: Orthopedics;  Laterality: Left;  . TUBAL LIGATION  1951    FAMILY HISTORY Family History  Problem Relation Age of Onset  . Cerebral aneurysm Mother   . Heart attack Father   . Alzheimer's disease Sister   . Emphysema Sister   . Stroke Sister   . Alzheimer's disease Sister   . Arthritis Sister   . Stroke Sister        Age 79   . Lung cancer Son     SOCIAL HISTORY Social History   Tobacco Use  . Smoking status: Never Smoker  . Smokeless tobacco: Never Used  Vaping Use  . Vaping Use: Never used  Substance Use Topics  . Alcohol use: Yes    Comment: rare social every 3 or 4 months wine  . Drug use: No         OPHTHALMIC EXAM: Base Eye Exam    Visual Acuity (ETDRS)      Right Left   Dist cc 20/50 -2 20/400   Dist ph cc NI NI   Correction: Glasses       Tonometry (Tonopen, 10:01 AM)      Right Left   Pressure 9 7       Pupils      Dark Light Shape React APD   Right 3 2 Round Sluggish None   Left 6 6 Irregular Minimal None       Visual Fields (Counting fingers)      Left Right    Full Full       Extraocular Movement      Right Left    Full Full       Neuro/Psych    Oriented x3: Yes   Mood/Affect: Normal       Dilation    Both eyes: 1.0% Mydriacyl, 2.5% Phenylephrine @ 10:01 AM        Slit Lamp and Fundus Exam    External Exam      Right Left   External Normal Normal       Slit Lamp Exam      Right Left   Lids/Lashes Normal Normal   Conjunctiva/Sclera White and quiet White and quiet   Cornea Clear Clear   Anterior  Chamber Deep  and quiet Deep and quiet   Iris Round and reactive keyhole iris loss from 8-9 meridian nasally   Lens Posterior chamber intraocular lens Posterior chamber intraocular lens   Anterior Vitreous Normal Normal, old pigment posterior to iol       Fundus Exam      Right Left   Posterior Vitreous Posterior vitreous detachment    Disc Normal    C/D Ratio 0.25    Macula no macular thickening, Hard drusen, no exudates, no hemorrhage    Vessels Inferior macular branch retinal vein occlusion.  Much less thickening    Periphery Normal           IMAGING AND PROCEDURES  Imaging and Procedures for 04/19/20  OCT, Retina - OU - Both Eyes       Right Eye Quality was good. Scan locations included subfoveal. Central Foveal Thickness: 281. Progression has been stable. Findings include cystoid macular edema.   Left Eye Quality was good. Scan locations included subfoveal. Central Foveal Thickness: 404. Findings include subretinal hyper-reflective material, retinal drusen , no SRF, no IRF, vitreomacular adhesion .   Notes Region of CME from superior branch retinal vein occlusion OD , Minor at 10-week interval post intravitreal Avastin.  We will repeat intravitreal Avastin OD for stabilization and examination OU in 3 months  OS with large subfoveal drusenoid debris deposit now increased over the last 4 months       Intravitreal Injection, Pharmacologic Agent - OD - Right Eye       Time Out 04/19/2020. 10:41 AM. Confirmed correct patient, procedure, site, and patient consented.   Anesthesia Topical anesthesia was used. Anesthetic medications included Akten 3.5%.   Procedure Preparation included 10% betadine to eyelids, 5% betadine to ocular surface, Ofloxacin . A 30 gauge needle was used.   Injection:  2.5 mg Bevacizumab (AVASTIN) 2.5mg /0.47mL SOSY   NDC: 71696-789-38, Lot: [id number]   Route: Intravitreal, Site: Right Eye  Post-op Post injection exam found visual acuity of at  least counting fingers. The patient tolerated the procedure well. There were no complications. The patient received written and verbal post procedure care education. Post injection medications were not given.                 ASSESSMENT/PLAN:  Branch retinal vein occlusion with macular edema of right eye Improved overall region of thickening and CME superior the fovea OD, controlled now at 54-month follow-up.  Post Avastin.  We will repeat injection today and examination next in 4 months      ICD-10-CM   1. Branch retinal vein occlusion with macular edema of right eye  H34.8310 OCT, Retina - OU - Both Eyes    Intravitreal Injection, Pharmacologic Agent - OD - Right Eye    bevacizumab (AVASTIN) SOSY 2.5 mg    1.  2.  3.  Ophthalmic Meds Ordered this visit:  Meds ordered this encounter  Medications  . bevacizumab (AVASTIN) SOSY 2.5 mg       Return in about 4 months (around 08/17/2020) for DILATE OU, COLOR FP, OCT, AVASTIN OCT, OD.  There are no Patient Instructions on file for this visit.   Explained the diagnoses, plan, and follow up with the patient and they expressed understanding.  Patient expressed understanding of the importance of proper follow up care.   Alford Highland Neira Bentsen M.D. Diseases & Surgery of the Retina and Vitreous [facility] 04/19/20     Abbreviations: M myopia (nearsighted); A astigmatism; H hyperopia (  farsighted); P presbyopia; Mrx spectacle prescription;  CTL contact lenses; OD right eye; OS left eye; OU both eyes  XT exotropia; ET esotropia; PEK punctate epithelial keratitis; PEE punctate epithelial erosions; DES dry eye syndrome; MGD meibomian gland dysfunction; ATs artificial tears; PFAT's preservative free artificial tears; [place] nuclear sclerotic cataract; PSC posterior subcapsular cataract; ERM epi-retinal membrane; PVD posterior vitreous detachment; RD retinal detachment; DM diabetes mellitus; DR diabetic retinopathy; NPDR  non-proliferative diabetic retinopathy; PDR proliferative diabetic retinopathy; CSME clinically significant macular edema; DME diabetic macular edema; dbh dot blot hemorrhages; CWS cotton wool spot; POAG primary open angle glaucoma; C/D cup-to-disc ratio; HVF humphrey visual field; GVF goldmann visual field; OCT optical coherence tomography; IOP intraocular pressure; BRVO Branch retinal vein occlusion; CRVO central retinal vein occlusion; CRAO central retinal artery occlusion; BRAO branch retinal artery occlusion; RT retinal tear; SB scleral buckle; PPV pars plana vitrectomy; VH Vitreous hemorrhage; PRP panretinal laser photocoagulation; IVK intravitreal kenalog; VMT vitreomacular traction; MH Macular hole;  NVD neovascularization of the disc; NVE neovascularization elsewhere; AREDS age related eye disease study; ARMD age related macular degeneration; POAG primary open angle glaucoma; EBMD epithelial/anterior basement membrane dystrophy; ACIOL anterior chamber intraocular lens; IOL intraocular lens; PCIOL posterior chamber intraocular lens; Phaco/IOL phacoemulsification with intraocular lens placement; [place] photorefractive keratectomy; LASIK laser assisted in situ keratomileusis; HTN hypertension; DM diabetes mellitus; COPD chronic obstructive pulmonary disease

## 2020-04-19 NOTE — Assessment & Plan Note (Signed)
Improved overall region of thickening and CME superior the fovea OD, controlled now at 21-month follow-up.  Post Avastin.  We will repeat injection today and examination next in 4 months

## 2020-04-20 DIAGNOSIS — R55 Syncope and collapse: Secondary | ICD-10-CM | POA: Diagnosis not present

## 2020-04-20 DIAGNOSIS — M6282 Rhabdomyolysis: Secondary | ICD-10-CM | POA: Diagnosis not present

## 2020-04-20 DIAGNOSIS — Z9181 History of falling: Secondary | ICD-10-CM | POA: Diagnosis not present

## 2020-05-03 DIAGNOSIS — Z9889 Other specified postprocedural states: Secondary | ICD-10-CM | POA: Diagnosis not present

## 2020-05-10 ENCOUNTER — Encounter: Payer: Self-pay | Admitting: Internal Medicine

## 2020-05-11 ENCOUNTER — Telehealth: Payer: Self-pay | Admitting: *Deleted

## 2020-05-11 DIAGNOSIS — G629 Polyneuropathy, unspecified: Secondary | ICD-10-CM

## 2020-05-11 MED ORDER — GABAPENTIN 300 MG PO CAPS: 300.0000 mg | ORAL_CAPSULE | Freq: Every day | ORAL | 3 refills | Status: DC

## 2020-05-11 NOTE — Telephone Encounter (Signed)
Patient notified and agreed. Wanted Rx faxed to [facility] ([place] Faxed.   Printed message and faxed to [place] Fax: [phone]

## 2020-05-11 NOTE — Telephone Encounter (Signed)
Patient called and stated that the Gabapentin 100mg  once at bedtime is not helping her tingling and numbness in her fingers and hands. Stated that it doesn't even make her sleepy.  Wondering if the medication needs to be increased.  Please Advise.

## 2020-05-11 NOTE — Telephone Encounter (Signed)
Let's increase her gabapentin to 300mg  po qhs.  We will need to send the order over to [place] where she lives.

## 2020-05-18 DIAGNOSIS — R55 Syncope and collapse: Secondary | ICD-10-CM | POA: Diagnosis not present

## 2020-05-18 DIAGNOSIS — M6282 Rhabdomyolysis: Secondary | ICD-10-CM | POA: Diagnosis not present

## 2020-05-18 DIAGNOSIS — Z9181 History of falling: Secondary | ICD-10-CM | POA: Diagnosis not present

## 2020-05-24 ENCOUNTER — Emergency Department (HOSPITAL_COMMUNITY): Payer: Medicare Other

## 2020-05-24 ENCOUNTER — Emergency Department (HOSPITAL_COMMUNITY)
Admission: EM | Admit: 2020-05-24 | Discharge: 2020-05-24 | Disposition: A | Discharge status: Home / Self Care | Payer: Medicare Other | Attending: Emergency Medicine | Admitting: Emergency Medicine

## 2020-05-24 ENCOUNTER — Encounter (HOSPITAL_COMMUNITY): Payer: Self-pay

## 2020-05-24 DIAGNOSIS — S0990XA Unspecified injury of head, initial encounter: Secondary | ICD-10-CM | POA: Diagnosis not present

## 2020-05-24 DIAGNOSIS — R404 Transient alteration of awareness: Secondary | ICD-10-CM | POA: Diagnosis not present

## 2020-05-24 DIAGNOSIS — M4312 Spondylolisthesis, cervical region: Secondary | ICD-10-CM | POA: Diagnosis not present

## 2020-05-24 DIAGNOSIS — Z79899 Other long term (current) drug therapy: Secondary | ICD-10-CM | POA: Diagnosis not present

## 2020-05-24 DIAGNOSIS — Z96652 Presence of left artificial knee joint: Secondary | ICD-10-CM | POA: Insufficient documentation

## 2020-05-24 DIAGNOSIS — W19XXXA Unspecified fall, initial encounter: Secondary | ICD-10-CM | POA: Diagnosis not present

## 2020-05-24 DIAGNOSIS — N390 Urinary tract infection, site not specified: Secondary | ICD-10-CM | POA: Diagnosis not present

## 2020-05-24 DIAGNOSIS — M2578 Osteophyte, vertebrae: Secondary | ICD-10-CM | POA: Diagnosis not present

## 2020-05-24 DIAGNOSIS — Z743 Need for continuous supervision: Secondary | ICD-10-CM | POA: Diagnosis not present

## 2020-05-24 DIAGNOSIS — I129 Hypertensive chronic kidney disease with stage 1 through stage 4 chronic kidney disease, or unspecified chronic kidney disease: Secondary | ICD-10-CM | POA: Insufficient documentation

## 2020-05-24 DIAGNOSIS — G319 Degenerative disease of nervous system, unspecified: Secondary | ICD-10-CM | POA: Diagnosis not present

## 2020-05-24 DIAGNOSIS — N183 Chronic kidney disease, stage 3 unspecified: Secondary | ICD-10-CM | POA: Insufficient documentation

## 2020-05-24 DIAGNOSIS — R55 Syncope and collapse: Secondary | ICD-10-CM | POA: Diagnosis not present

## 2020-05-24 DIAGNOSIS — G9389 Other specified disorders of brain: Secondary | ICD-10-CM | POA: Diagnosis not present

## 2020-05-24 DIAGNOSIS — R29810 Facial weakness: Secondary | ICD-10-CM | POA: Diagnosis not present

## 2020-05-24 DIAGNOSIS — R6889 Other general symptoms and signs: Secondary | ICD-10-CM | POA: Diagnosis not present

## 2020-05-24 DIAGNOSIS — J320 Chronic maxillary sinusitis: Secondary | ICD-10-CM | POA: Diagnosis not present

## 2020-05-24 DIAGNOSIS — S199XXA Unspecified injury of neck, initial encounter: Secondary | ICD-10-CM | POA: Diagnosis not present

## 2020-05-24 LAB — CBC WITH DIFFERENTIAL/PLATELET
Abs Immature Granulocytes: 0.05 10*3/uL (ref 0.00–0.07)
Basophils Absolute: 0 10*3/uL (ref 0.0–0.1)
Basophils Relative: 1 %
Eosinophils Absolute: 0.2 10*3/uL (ref 0.0–0.5)
Eosinophils Relative: 2 %
HCT: 29.4 % — ABNORMAL LOW (ref 36.0–46.0)
Hemoglobin: 9.1 g/dL — ABNORMAL LOW (ref 12.0–15.0)
Immature Granulocytes: 1 %
Lymphocytes Relative: 9 %
Lymphs Abs: 0.8 10*3/uL (ref 0.7–4.0)
MCH: 29.8 pg (ref 26.0–34.0)
MCHC: 31 g/dL (ref 30.0–36.0)
MCV: 96.4 fL (ref 80.0–100.0)
Monocytes Absolute: 0.5 10*3/uL (ref 0.1–1.0)
Monocytes Relative: 6 %
Neutro Abs: 7.2 10*3/uL (ref 1.7–7.7)
Neutrophils Relative %: 82 %
Platelets: 238 10*3/uL (ref 150–400)
RBC: 3.05 MIL/uL — ABNORMAL LOW (ref 3.87–5.11)
RDW: 14.1 % (ref 11.5–15.5)
WBC: 8.8 10*3/uL (ref 4.0–10.5)
nRBC: 0 % (ref 0.0–0.2)
nRBC: 0 /100 WBC

## 2020-05-24 LAB — URINALYSIS, MICROSCOPIC (REFLEX): WBC, UA: >50 WBC/hpf (ref 0–5)

## 2020-05-24 LAB — URINALYSIS, ROUTINE W REFLEX MICROSCOPIC
Bilirubin Urine: NEGATIVE
Glucose, UA: NEGATIVE mg/dL
Ketones, ur: NEGATIVE mg/dL
Nitrite: POSITIVE — AB
Protein, ur: 100 mg/dL — AB
Specific Gravity, Urine: 1.025 (ref 1.005–1.030)
pH: 6 (ref 5.0–8.0)

## 2020-05-24 LAB — COMPREHENSIVE METABOLIC PANEL
ALT: 14 U/L (ref 0–44)
AST: 17 U/L (ref 15–41)
Albumin: 2.9 g/dL — ABNORMAL LOW (ref 3.5–5.0)
Alkaline Phosphatase: 53 U/L (ref 38–126)
Anion gap: 6 (ref 5–15)
BUN: 36 mg/dL — ABNORMAL HIGH (ref 8–23)
CO2: 22 mmol/L (ref 22–32)
Calcium: 8.2 mg/dL — ABNORMAL LOW (ref 8.9–10.3)
Chloride: 111 mmol/L (ref 98–111)
Creatinine, Ser: 1.66 mg/dL — ABNORMAL HIGH (ref 0.44–1.00)
GFR, Estimated: 28 mL/min — ABNORMAL LOW (ref >60)
Glucose, Bld: 141 mg/dL — ABNORMAL HIGH (ref 70–99)
Potassium: 4.6 mmol/L (ref 3.5–5.1)
Sodium: 139 mmol/L (ref 135–145)
Total Bilirubin: 0.7 mg/dL (ref 0.3–1.2)
Total Protein: 5.7 g/dL — ABNORMAL LOW (ref 6.5–8.1)

## 2020-05-24 MED ORDER — SODIUM CHLORIDE 0.9 % IV BOLUS
500.0000 mL | Freq: Once | INTRAVENOUS | Status: AC
  Administered 2020-05-24: 500 mL via INTRAVENOUS

## 2020-05-24 MED ORDER — FENTANYL CITRATE (PF) 100 MCG/2ML IJ SOLN
50.0000 ug | Freq: Once | INTRAMUSCULAR | Status: AC
  Administered 2020-05-24: 50 ug via INTRAVENOUS
  Filled 2020-05-24: qty 2

## 2020-05-24 MED ORDER — SODIUM CHLORIDE 0.9 % IV BOLUS
1000.0000 mL | Freq: Once | INTRAVENOUS | Status: AC
  Administered 2020-05-24: 1000 mL via INTRAVENOUS

## 2020-05-24 MED ORDER — CEPHALEXIN 500 MG PO CAPS: 500.0000 mg | ORAL_CAPSULE | Freq: Two times a day (BID) | ORAL | 0 refills | Status: AC

## 2020-05-24 MED ORDER — CEPHALEXIN 500 MG PO CAPS: 500.0000 mg | ORAL_CAPSULE | Freq: Four times a day (QID) | ORAL | 0 refills | Status: DC

## 2020-05-24 MED ORDER — FENTANYL CITRATE (PF) 100 MCG/2ML IJ SOLN
25.0000 ug | Freq: Once | INTRAMUSCULAR | Status: AC
Start: 2020-05-24 — End: 2020-05-24
  Administered 2020-05-24: 25 ug via INTRAVENOUS
  Filled 2020-05-24: qty 2

## 2020-05-24 MED ORDER — LIDOCAINE HCL URETHRAL/MUCOSAL 2 % EX GEL
1.0000 "application " | Freq: Once | CUTANEOUS | Status: AC
  Administered 2020-05-24: 1 via TOPICAL
  Filled 2020-05-24: qty 11

## 2020-05-24 MED ORDER — CEPHALEXIN 250 MG PO CAPS
500.0000 mg | ORAL_CAPSULE | Freq: Once | ORAL | Status: AC
  Administered 2020-05-24: 500 mg via ORAL
  Filled 2020-05-24: qty 2

## 2020-05-24 MED ORDER — ACETAMINOPHEN 325 MG PO TABS
650.0000 mg | ORAL_TABLET | Freq: Once | ORAL | Status: AC
  Administered 2020-05-24: 650 mg via ORAL
  Filled 2020-05-24: qty 2

## 2020-05-24 NOTE — ED Triage Notes (Signed)
Pt comes from [place] [facility] assisted living via EMS, complains of syncopal episode on toilet, Hx of orthostatic hypotension. EMS reports pt was cold and clammy upon their arrival, awake but mumbling. Pt does not remember episode. No radial pulses but weak carotid pulses. Pt laid flat with improvement in symptoms and appearance. Hx of syncope always with sitting per pt. Pt is awake a/0x4 at this time. 20g IV established LAC. approx 400 ml NS administered. 12 lead unremarkable. Pt did not fall and no injuries noted or reported. BGL=146.

## 2020-05-24 NOTE — ED Provider Notes (Signed)
[facility] EMERGENCY DEPARTMENT Provider Note   CSN: [id number] Arrival date & time: 05/24/20  1308     History Chief Complaint  Patient presents with  . Fall  . Loss of Consciousness    Lisa [name] is a 87 y.o. female.  Patient had syncopal event while using the bathroom today.  EMS states that she was using bedside commode where she had a bowel movement.  She was found on the floor.  Patient uses a wheelchair at baseline.  Was unable to get up off the floor due to the chronic wheelchair status.  She has no extremity pain.  No headache or neck pain.  Feels back to her baseline now.  She had not had any food yet today.  Per EMS patient had brown stool in the commode.  Patient denies any black or bloody stools.  Has a history of similar events in the past.  Patient not on blood thinners.  Denies any chest pain, shortness of breath, abdominal pain.  The history is provided by the patient and the EMS personnel.  Loss of Consciousness Episode history:  Single Most recent episode:  Today Progression:  Resolved Chronicity:  New Context: bowel movement   Witnessed: no   Relieved by:  Nothing Worsened by:  Nothing Associated symptoms: no anxiety, no chest pain, no confusion, no diaphoresis, no difficulty breathing, no dizziness, no fever, no focal sensory loss, no focal weakness, no headaches, no malaise/fatigue, no nausea, no palpitations, no recent fall, no recent injury, no recent surgery, no rectal bleeding, no seizures, no shortness of breath, no visual change, no vomiting and no weakness        Past Medical History:  Diagnosis Date  . Cervicalgia   . Chronic kidney disease   . Chronic kidney disease, stage III (moderate) (HCC)   . Dermatophytosis of groin and perianal area   . Dyspnea   . Dysrhythmia   . Hyperlipidemia LDL goal < 100   . Insomnia, unspecified   . Muscle weakness (generalized)   . Orthostatic hypotension   . Osteoarthrosis,  unspecified whether generalized or localized, lower leg    osteoarthritis - upper thoracic area, left knee.  . Other abnormal glucose   . Other B-complex deficiencies   . Other malaise and fatigue   . Palpitations   . Senile cataract, unspecified   . Shortness of breath   . Stroke [facility])    small "stroke" - right sided weakness for a day.  . Stroke (HCC)   . Tachycardia, unspecified   . Undiagnosed cardiac murmurs   . Unspecified essential hypertension   . Unspecified vitamin D deficiency   . Urinary tract infection     Patient Active Problem List   Diagnosis Date Noted  . Intermediate stage nonexudative age-related macular degeneration of left eye 01/05/2020  . Prolapse of iris in recent wound, left, subsequent encounter 09/16/2019  . Orthostatic syncope 09/04/2019  . Scleral laceration of left eye 09/03/2019  . Fall at home, initial encounter 09/03/2019  . Syncope and collapse 09/03/2019  . Generalized weakness 09/03/2019  . Recurrent UTI 09/03/2019  . Abnormal LFTs 09/03/2019  . Branch retinal vein occlusion with macular edema of right eye 07/07/2019  . Branch retinal vein occlusion with neovascularization of right eye 07/07/2019  . Cystoid macular edema of right eye 07/07/2019  . Exudative age-related macular degeneration of left eye with inactive choroidal neovascularization (HCC) 07/07/2019  . Genitourinary syndrome of menopause 07/01/2019  . Other  fatigue 12/03/2017  . Anemia 12/03/2017  . Balance problem 12/03/2017  . Lichen sclerosus of female genitalia 12/03/2017  . Syncope 07/17/2017  . Renal hypertension 07/05/2015  . Neck pain, chronic 07/05/2015  . Status post total left knee replacement 07/05/2015  . Primary osteoarthritis of left knee 01/15/2015  . S/P knee replacement 01/15/2015  . Dry skin 01/28/2014  . HLD (hyperlipidemia) 01/06/2014  . Essential hypertension 01/06/2014  . B12 deficiency 09/29/2013  . Mixed hyperlipidemia 09/29/2013  . Hypertension,  renal disease, stage 1-4 or unspecified chronic kidney disease 06/26/2013  . Seasonal allergies 06/26/2013  . CVA (cerebral vascular accident) (HCC) 04/09/2013  . Stroke (HCC) 04/09/2013  . Insomnia 09/14/2012  . Hyperlipidemia   . Osteoarthritis of right knee   . Undiagnosed cardiac murmurs   . Shortness of breath   . Chronic kidney disease, stage III (moderate) (HCC)   . Cervicalgia   . HYPERTENSION 11/30/2008  . Dyspnea 11/30/2008  . History of cardiovascular disorder 11/30/2008    Past Surgical History:  Procedure Laterality Date  . CATARACT EXTRACTION W/PHACO Bilateral 1994   Dr. Dione Booze  . CATARACT EXTRACTION, BILATERAL Bilateral   . EYE SURGERY     cataract  . RUPTURED GLOBE EXPLORATION AND REPAIR Left 09/03/2019   Procedure: Scleral laceration repair LEFT EYE, exam under anesthesia LEFT EYE;  Surgeon: Olivia Canter, MD;  Location: [facility] OR;  Service: Ophthalmology;  Laterality: Left;  . TOE AMPUTATION  2002  . TONSILLECTOMY  1944  . TONSILLECTOMY     younger yrs  . TOTAL KNEE ARTHROPLASTY Left 01/15/2015   Procedure: LEFT TOTAL KNEE ARTHROPLASTY;  Surgeon: Eugenia Mcalpine, MD;  Location: [facility] ORS;  Service: Orthopedics;  Laterality: Left;  . TUBAL LIGATION  1951     OB History   No obstetric history on file.     Family History  Problem Relation Age of Onset  . Cerebral aneurysm Mother   . Heart attack Father   . Alzheimer's disease Sister   . Emphysema Sister   . Stroke Sister   . Alzheimer's disease Sister   . Arthritis Sister   . Stroke Sister        Age 79   . Lung cancer Son     Social History   Tobacco Use  . Smoking status: Never Smoker  . Smokeless tobacco: Never Used  Vaping Use  . Vaping Use: Never used  Substance Use Topics  . Alcohol use: Yes    Comment: rare social every 3 or 4 months wine  . Drug use: No    Home Medications Prior to Admission medications   Medication Sig Start Date End Date Taking? Authorizing Provider   Acetaminophen 500 MG capsule Take 2 capsules (1,000 mg total) by mouth at bedtime. 09/25/19   Reed, Tiffany L, DO  carvedilol (COREG) 6.25 MG tablet Take 6.25 mg by mouth 2 (two) times daily. 06/05/19   [provider]  cephALEXin (KEFLEX) 500 MG capsule Take 1 capsule (500 mg total) by mouth 2 (two) times daily for 7 days. 05/24/20 05/31/20  Bronte Sabado, DO  Cholecalciferol (VITAMIN D3) 10 MCG (400 UNIT) CAPS Take 400 Units by mouth daily.    [provider]  clobetasol cream (TEMOVATE) 0.05 % Apply 1 application topically 2 (two) times daily. 01/29/20   Reed, Tiffany L, DO  conjugated estrogens (PREMARIN) vaginal cream Place 1 Applicatorful vaginally daily. 09/06/19 09/05/20  Pokhrel, Rebekah Chesterfield, MD  Cyanocobalamin 1000 MCG CAPS Take one capsule by mouth  once daily.    [provider]  gabapentin (NEURONTIN) 300 MG capsule Take 1 capsule (300 mg total) by mouth at bedtime. 05/11/20   Reed, Tiffany L, DO  Multiple Vitamins-Minerals (PRESERVISION AREDS 2+MULTI VIT) CAPS Take 1 capsule by mouth 2 (two) times daily.     [provider]  nitrofurantoin (MACRODANTIN) 50 MG capsule Take 50 mg by mouth at bedtime.    [provider]  ondansetron (ZOFRAN) 4 MG tablet Take 4 mg by mouth.    [provider]  polyethylene glycol (MIRALAX / GLYCOLAX) 17 g packet Take 17 g by mouth as needed.    [provider]  pyridoxine (B-6) 100 MG tablet Take 100 mg by mouth as needed. For burning upon urination.    [provider]  sertraline (ZOLOFT) 25 MG tablet Take 1 tablet (25 mg total) by mouth daily. For anxiety and depression 10/30/19   Reed, Tiffany L, DO  traMADol (ULTRAM) 50 MG tablet Take 1 tablet (50 mg total) by mouth 2 (two) times daily as needed for moderate pain (for pain). 04/12/20 04/12/21  Ngetich, Dinah C, NP  traZODone (DESYREL) 50 MG tablet Take 0.5 tablets (25 mg total) by mouth at bedtime. 09/25/19   Reed, Tiffany L, DO    Allergies     Ace inhibitors, Lisinopril, and Statins  Review of Systems   Review of Systems  Constitutional: Negative for chills, diaphoresis, fever and malaise/fatigue.  HENT: Negative for ear pain and sore throat.   Eyes: Negative for pain and visual disturbance.  Respiratory: Negative for cough and shortness of breath.   Cardiovascular: Positive for syncope. Negative for chest pain and palpitations.  Gastrointestinal: Negative for abdominal pain, nausea and vomiting.  Genitourinary: Negative for dysuria and hematuria.  Musculoskeletal: Negative for arthralgias and back pain.  Skin: Negative for color change and rash.  Neurological: Negative for dizziness, tremors, focal weakness, seizures, syncope, facial asymmetry, speech difficulty, weakness, light-headedness, numbness and headaches.  Psychiatric/Behavioral: Negative for confusion.  All other systems reviewed and are negative.   Physical Exam Updated Vital Signs BP (!) 173/68   Pulse 76   Temp 98.3 F (36.8 C) (Oral)   Resp 16   SpO2 95%   Physical Exam Vitals and nursing note reviewed.  Constitutional:      General: She is not in acute distress.    Appearance: She is well-developed and well-nourished. She is not ill-appearing.  HENT:     Head: Normocephalic and atraumatic.     Nose: Nose normal.     Mouth/Throat:     Mouth: Mucous membranes are dry.  Eyes:     Extraocular Movements: Extraocular movements intact.     Conjunctiva/sclera: Conjunctivae normal.     Pupils: Pupils are equal, round, and reactive to light.  Cardiovascular:     Rate and Rhythm: Normal rate and regular rhythm.     Pulses: Normal pulses.     Heart sounds: Normal heart sounds. No murmur heard.   Pulmonary:     Effort: Pulmonary effort is normal. No respiratory distress.     Breath sounds: Normal breath sounds.  Abdominal:     General: Abdomen is flat. There is no distension.     Palpations: Abdomen is soft.     Tenderness: There is no abdominal  tenderness.  Musculoskeletal:        General: No tenderness or edema.     Cervical back: Normal range of motion and neck supple. No tenderness.  Skin:  General: Skin is warm and dry.  Neurological:     General: No focal deficit present.     Mental Status: She is alert and oriented to person, place, and time. Mental status is at baseline.     Cranial Nerves: No cranial nerve deficit.     Sensory: No sensory deficit.     Motor: No weakness.  Psychiatric:        Mood and Affect: Mood and affect and mood normal.     ED Results / Procedures / Treatments   Labs (all labs ordered are listed, but only abnormal results are displayed) Labs Reviewed  CBC WITH DIFFERENTIAL/PLATELET - Abnormal; Notable for the following components:      Result Value   RBC 3.05 (*)    Hemoglobin 9.1 (*)    HCT 29.4 (*)    All other components within normal limits  COMPREHENSIVE METABOLIC PANEL - Abnormal; Notable for the following components:   Glucose, Bld 141 (*)    BUN 36 (*)    Creatinine, Ser 1.66 (*)    Calcium 8.2 (*)    Total Protein 5.7 (*)    Albumin 2.9 (*)    GFR, Estimated 28 (*)    All other components within normal limits  URINALYSIS, ROUTINE W REFLEX MICROSCOPIC - Abnormal; Notable for the following components:   Color, Urine YELLOW (*)    APPearance TURBID (*)    Hgb urine dipstick MODERATE (*)    Protein, ur 100 (*)    Nitrite POSITIVE (*)    Leukocytes,Ua LARGE (*)    All other components within normal limits  URINALYSIS, MICROSCOPIC (REFLEX) - Abnormal; Notable for the following components:   Bacteria, UA MANY (*)    All other components within normal limits  URINE CULTURE    EKG EKG Interpretation  Date/Time:  Monday May 24 2020 09:35:18 EST Ventricular Rate:  74 PR Interval:    QRS Duration: 150 QT Interval:  433 QTC Calculation: 481 R Axis:   -4 Text Interpretation: Sinus rhythm Atrial premature complexes Borderline prolonged PR interval Right bundle branch  block No significant change since last tracing Confirmed by Virgina Norfolk [phone]) on 05/24/2020 9:37:08 AM   Radiology CT Head Wo Contrast  Result Date: 05/24/2020 CLINICAL DATA:  Status post trauma. EXAM: CT HEAD WITHOUT CONTRAST TECHNIQUE: Contiguous axial images were obtained from the base of the skull through the vertex without intravenous contrast. COMPARISON:  December 16, 2019 FINDINGS: Brain: There is mild to moderate severity cerebral atrophy with widening of the extra-axial spaces and ventricular dilatation. There are areas of decreased attenuation within the white matter tracts of the supratentorial brain, consistent with microvascular disease changes. Vascular: No hyperdense vessel or unexpected calcification. Skull: Normal. Negative for fracture or focal lesion. Sinuses/Orbits: Moderate severity right maxillary sinus mucosal thickening is seen. Other: None. IMPRESSION: 1. Mild to moderate severity cerebral atrophy. 2. No acute intracranial abnormality. 3. Moderate severity right maxillary sinus disease. Electronically Signed   By: Aram Candela M.D.   On: 05/24/2020 12:19   CT Cervical Spine Wo Contrast  Result Date: 05/24/2020 CLINICAL DATA:  Status post trauma. EXAM: CT CERVICAL SPINE WITHOUT CONTRAST TECHNIQUE: Multidetector CT imaging of the cervical spine was performed without intravenous contrast. Multiplanar CT image reconstructions were also generated. COMPARISON:  December 16, 2019 FINDINGS: Alignment: There is approximately 1 mm anterolisthesis of the C4 vertebral body on C5. Approximately 1 mm retrolisthesis of the C5 vertebral body on C6 is also noted. Skull base  and vertebrae: No acute fracture. Chronic changes are seen involving the body and tip of the dens. No primary bone lesion or focal pathologic process. Soft tissues and spinal canal: No prevertebral fluid or swelling. No visible canal hematoma. Disc levels: Marked severity anterior and lateral osteophyte formation is  seen at the levels of C4-C5, C5-C6, C6-C7 and C7-T1. Mild to moderate severity endplate sclerosis is noted at the level of C3-C4. There is marked severity narrowing of the anterior atlantoaxial articulation. Moderate severity, predominant posterior intervertebral disc space narrowing is seen at the levels of C2-C3, C5-C6, C6-C7 and C7-T1. Bilateral marked severity multilevel facet joint hypertrophy is noted. Upper chest: Negative. Other: Moderate severity right maxillary sinus mucosal thickening is seen. IMPRESSION: 1. Marked severity multilevel degenerative changes, as described above. 2. No evidence of an acute fracture or subluxation. 3. Moderate severity right maxillary sinus mucosal thickening. Electronically Signed   By: Aram Candela M.D.   On: 05/24/2020 12:24    Procedures Procedures   Medications Ordered in ED Medications  sodium chloride 0.9 % bolus 1,000 mL (0 mLs Intravenous Stopped 05/24/20 1134)  acetaminophen (TYLENOL) tablet 650 mg (650 mg Oral Given 05/24/20 1144)  sodium chloride 0.9 % bolus 500 mL (0 mLs Intravenous Stopped 05/24/20 1304)  fentaNYL (SUBLIMAZE) injection 50 mcg (50 mcg Intravenous Given 05/24/20 1142)  lidocaine (XYLOCAINE) 2 % jelly 1 application (1 application Topical Given 05/24/20 1145)  fentaNYL (SUBLIMAZE) injection 25 mcg (25 mcg Intravenous Given 05/24/20 1257)  cephALEXin (KEFLEX) capsule 500 mg (500 mg Oral Given 05/24/20 1306)    ED Course  I have reviewed the triage vital signs and the nursing notes.  Pertinent labs & imaging results that were available during my care of the patient were reviewed by me and considered in my medical decision making (see chart for details).    MDM Rules/Calculators/A&P                          Lisa [name] is a 87 year old female with history of high cholesterol, CKD, stroke, orthostatic hypotension who presents the ED after syncopal event while on bedside commode.  Patient normal vitals.  No fever.  Normal  neurological exam.  Patient states that she was using bedside commode to have a bowel movement and next thing she knew she was on the floor.  When EMS got there she was still little bit weak but has improved greatly throughout transport.  Patient overall appears well.  No extremity pain on exam.  Will get head CT and neck CT to rule out traumatic injuries given fall.  EKG shows sinus rhythm.  No ischemic changes, unchanged from prior.  No chest pain, no shortness of breath, no abdominal pain.  Denies any melena.  Overall suspect vasovagal event.  Has a history of the same.  She appears slightly dry with dry mucous membranes on exam.  Will give fluid bolus and check orthostatics.  Will check for any anemia, electrolyte abnormality otherwise.  CT imaging unremarkable.  Urinalysis consistent with infection.  No other significant anemia, electrolyte abnormality, leukocytosis.  Patient was having some acute on chronic pain due to her lichen planus and lidocaine cream was placed.  Was given some doses of fentanyl with relief.  Did not appear to be any acute on chronic infection in this area as well.  We will have her follow-up with dermatologist.  Discharged in ED in good condition.  Suspect vasovagal event today.  UTI  likely from poor hygiene given her lichen planus.  This chart was dictated using voice recognition software.  Despite best efforts to proofread,  errors can occur which can change the documentation meaning.    Final Clinical Impression(s) / ED Diagnoses Final diagnoses:  Lower urinary tract infectious disease  Vasovagal episode    Rx / DC Orders ED Discharge Orders         Ordered    cephALEXin (KEFLEX) 500 MG capsule  4 times daily,   Status:  Discontinued        05/24/20 1333    cephALEXin (KEFLEX) 500 MG capsule  2 times daily        05/24/20 1333           [place] [employer], Kishan [place], DO 05/24/20 1335

## 2020-05-26 LAB — URINE CULTURE: Culture: >=100000 — AB

## 2020-06-03 ENCOUNTER — Ambulatory Visit (INDEPENDENT_AMBULATORY_CARE_PROVIDER_SITE_OTHER): Payer: Medicare Other | Admitting: Internal Medicine

## 2020-06-03 ENCOUNTER — Other Ambulatory Visit: Payer: Self-pay

## 2020-06-03 ENCOUNTER — Encounter: Payer: Self-pay | Admitting: Internal Medicine

## 2020-06-03 VITALS — BP 156/88 | HR 86 | Temp 97.3°F | Ht 67.0 in | Wt 142.4 lb

## 2020-06-03 DIAGNOSIS — N958 Other specified menopausal and perimenopausal disorders: Secondary | ICD-10-CM | POA: Diagnosis not present

## 2020-06-03 DIAGNOSIS — I951 Orthostatic hypotension: Secondary | ICD-10-CM

## 2020-06-03 DIAGNOSIS — G629 Polyneuropathy, unspecified: Secondary | ICD-10-CM

## 2020-06-03 DIAGNOSIS — R350 Frequency of micturition: Secondary | ICD-10-CM

## 2020-06-03 DIAGNOSIS — M6281 Muscle weakness (generalized): Secondary | ICD-10-CM | POA: Diagnosis not present

## 2020-06-03 DIAGNOSIS — L900 Lichen sclerosus et atrophicus: Secondary | ICD-10-CM

## 2020-06-03 LAB — POCT URINALYSIS DIPSTICK
Bilirubin, UA: NEGATIVE
Glucose, UA: NEGATIVE
Ketones, UA: NEGATIVE
Nitrite, UA: NEGATIVE
Protein, UA: POSITIVE — AB
Spec Grav, UA: 1.02 (ref 1.010–1.025)
Urobilinogen, UA: NEGATIVE E.U./dL — AB
pH, UA: 5 (ref 5.0–8.0)

## 2020-06-03 MED ORDER — DULOXETINE HCL 20 MG PO CPEP: 20.0000 mg | ORAL_CAPSULE | Freq: Every day | ORAL | 3 refills | Status: DC

## 2020-06-03 NOTE — Progress Notes (Signed)
Location:  [facility] Provider:  Earland Reish L. Renato Gails, D.O., C.M.D. [facility] med room fax number:  [phone]  Code Status: DNR, MOST Goals of Care:  Advanced Directives 04/08/2020  Does Patient Have a Medical Advance Directive? Yes  Type of Estate agent of [place];Out of facility DNR (pink MOST or yellow form);Living will  Does patient want to make changes to medical advance directive? No - Patient declined  Copy of Healthcare Power of Attorney in Chart? Yes - validated most recent copy scanned in chart (See row information)  Would patient like information on creating a medical advance directive? -  Pre-existing out of facility DNR order (yellow form or pink MOST form) Pink MOST/Yellow Form most recent copy in chart - Physician notified to receive inpatient order     Chief Complaint  Patient presents with  . Medical Management of Chronic Issues    Medical Management of Chronic Issues. Complains of possible UTI, Frequency.     HPI: Patient is a 87 y.o. female seen today for medical management of chronic diseases.    Hands are still very numb.  Increased gabapentin which did not help enough.  Dropping items.  They tingle all of the time.  Better if keeps them warm and still.  Helps to put them under the covers. Her neck arthritis is terrible.  Hands only mildly numb.   She's happy if she rests 2 hrs b/w urinating.  She had a spell of orthostatic hypotension that caused her to pass out when she was on the commode.  She had not been feeling well that morning when she was woken and CNA came back and checked on her.  CT scan again.  Dirty urine sample at hospital found.   Goal is to get stronger to do enough with her grandson and great grands when they come.  Wants to do some PT for better transfers.    Does not want to be hospitalized.  Has MOST that indicates this, but when she did not respond last time, [facility] still sent her out.    Past Medical History:   Diagnosis Date  . Cervicalgia   . Chronic kidney disease   . Chronic kidney disease, stage III (moderate) (HCC)   . Dermatophytosis of groin and perianal area   . Dyspnea   . Dysrhythmia   . Hyperlipidemia LDL goal < 100   . Insomnia, unspecified   . Muscle weakness (generalized)   . Orthostatic hypotension   . Osteoarthrosis, unspecified whether generalized or localized, lower leg    osteoarthritis - upper thoracic area, left knee.  . Other abnormal glucose   . Other B-complex deficiencies   . Other malaise and fatigue   . Palpitations   . Senile cataract, unspecified   . Shortness of breath   . Stroke [facility])    small "stroke" - right sided weakness for a day.  . Stroke (HCC)   . Tachycardia, unspecified   . Undiagnosed cardiac murmurs   . Unspecified essential hypertension   . Unspecified vitamin D deficiency   . Urinary tract infection     Past Surgical History:  Procedure Laterality Date  . CATARACT EXTRACTION W/PHACO Bilateral 1994   Dr. Dione Booze  . CATARACT EXTRACTION, BILATERAL Bilateral   . EYE SURGERY     cataract  . RUPTURED GLOBE EXPLORATION AND REPAIR Left 09/03/2019   Procedure: Scleral laceration repair LEFT EYE, exam under anesthesia LEFT EYE;  Surgeon: Olivia Canter, MD;  Location: [facility] OR;  Service: Ophthalmology;  Laterality: Left;  . TOE AMPUTATION  2002  . TONSILLECTOMY  1944  . TONSILLECTOMY     younger yrs  . TOTAL KNEE ARTHROPLASTY Left 01/15/2015   Procedure: LEFT TOTAL KNEE ARTHROPLASTY;  Surgeon: Eugenia Mcalpine, MD;  Location: [facility] ORS;  Service: Orthopedics;  Laterality: Left;  . TUBAL LIGATION  1951    Allergies  Allergen Reactions  . Ace Inhibitors Cough  . Lisinopril Cough  . Statins Other (See Comments)    Body aches and pains    Outpatient Encounter Medications as of 06/03/2020  Medication Sig  . Acetaminophen 500 MG capsule Take 2 capsules (1,000 mg total) by mouth at bedtime.  . carvedilol (COREG) 6.25 MG tablet Take 6.25 mg  by mouth 2 (two) times daily.  . Cholecalciferol (VITAMIN D3) 10 MCG (400 UNIT) CAPS Take 400 Units by mouth daily.  . clobetasol cream (TEMOVATE) 0.05 % Apply 1 application topically 2 (two) times daily.  Marland Kitchen conjugated estrogens (PREMARIN) vaginal cream Place 1 Applicatorful vaginally daily.  . Cyanocobalamin 1000 MCG CAPS Take one capsule by mouth once daily.  Marland Kitchen gabapentin (NEURONTIN) 300 MG capsule Take 1 capsule (300 mg total) by mouth at bedtime.  . Multiple Vitamins-Minerals (PRESERVISION AREDS 2+MULTI VIT) CAPS Take 1 capsule by mouth 2 (two) times daily.   . ondansetron (ZOFRAN) 4 MG tablet Take 4 mg by mouth.  . polyethylene glycol (MIRALAX / GLYCOLAX) 17 g packet Take 17 g by mouth as needed.  . pyridoxine (B-6) 100 MG tablet Take 100 mg by mouth as needed. For burning upon urination.  . sertraline (ZOLOFT) 25 MG tablet Take 1 tablet (25 mg total) by mouth daily. For anxiety and depression  . traMADol (ULTRAM) 50 MG tablet Take 1 tablet (50 mg total) by mouth 2 (two) times daily as needed for moderate pain (for pain).  . traZODone (DESYREL) 50 MG tablet Take 0.5 tablets (25 mg total) by mouth at bedtime.  . [DISCONTINUED] nitrofurantoin (MACRODANTIN) 50 MG capsule Take 50 mg by mouth at bedtime.   No facility-administered encounter medications on file as of 06/03/2020.    Review of Systems:  Review of Systems  Constitutional: Negative for chills and fever.  HENT: Positive for hearing loss. Negative for congestion and sore throat.   Eyes: Positive for blurred vision.       Left vision minimal after eye injury from fall  Respiratory: Negative for cough and shortness of breath.   Cardiovascular: Negative for chest pain, palpitations and leg swelling.  Gastrointestinal: Negative for abdominal pain, blood in stool, constipation and melena.  Genitourinary: Positive for dysuria.       Chronic genitourinary syndrome of menopause (see urogyn notes in care everywhere)  Musculoskeletal:  Positive for falls.  Skin: Negative for rash.  Neurological: Positive for tingling, sensory change, loss of consciousness and weakness. Negative for dizziness.       Sensory loss in bilateral hands, minimal in feet  Endo/Heme/Allergies: Bruises/bleeds easily.  Psychiatric/Behavioral: Negative for depression and memory loss. The patient is not nervous/anxious.     Health Maintenance  Topic Date Due  . COVID-19 Vaccine (4 - Booster for Pfizer series) 06/29/2020  . TETANUS/TDAP  12/15/2029  . INFLUENZA VACCINE  Completed  . PNA vac Low Risk Adult  Completed  . DEXA SCAN  Addressed  . HPV VACCINES  Aged Out    Physical Exam: Vitals:   06/03/20 1302  BP: (!) 156/88  Pulse: 86  Temp: (!) 97.3 F (36.3  C)  TempSrc: Skin  SpO2: 98%  Weight: 142 lb 6.4 oz (64.6 kg)  Height: 5\' 7"  (1.702 m)   Body mass index is 22.3 kg/m. Physical Exam Vitals reviewed.  Constitutional:      Appearance: Normal appearance.  HENT:     Ears:     Comments: HOH, speaks loudly Eyes:     Comments: Glasses, loss of left eye vision  Cardiovascular:     Rate and Rhythm: Normal rate and regular rhythm.     Pulses: Normal pulses.     Heart sounds: Normal heart sounds.  Pulmonary:     Effort: Pulmonary effort is normal.     Breath sounds: Normal breath sounds. No wheezing, rhonchi or rales.  Abdominal:     General: Bowel sounds are normal.     Palpations: Abdomen is soft.  Musculoskeletal:     Right lower leg: No edema.     Left lower leg: No edema.     Comments: Neck muscle tenderness, right greater than left  Skin:    Comments: Abrasions left knee/lower leg  Neurological:     Mental Status: She is alert and oriented to person, place, and time.     Sensory: Sensory deficit present.     Comments: Has been coming in wheelchair for past several visits  Psychiatric:        Mood and Affect: Mood normal.        Behavior: Behavior normal.        Thought Content: Thought content normal.     Labs  reviewed: Basic Metabolic Panel: Recent Labs    06/05/19 0901 07/15/19 1335 09/05/19 0242 11/25/19 1439 05/24/20 0943  NA 142   < > 134* 141 139  K 4.2   < > 3.9 4.0 4.6  CL 107   < > 103 105 111  CO2 27   < > 24 27 22   GLUCOSE 109*   < > 118* 129 141*  BUN 27*   < > 23 24 36*  CREATININE 1.14*   < > 1.14* 1.32* 1.66*  CALCIUM 9.2   < > 8.1* 8.9 8.2*  MG  --   --  1.8  --   --   TSH 2.44  --   --   --   --    < > = values in this interval not displayed.   Liver Function Tests: Recent Labs    09/03/19 1400 09/05/19 0242 11/25/19 1439 05/24/20 0943  AST 45* 17 14 17   ALT 202* 94* 11 14  ALKPHOS 162* 106  --  53  BILITOT 1.4* 0.9 0.9 0.7  PROT 5.9* 4.9* 5.6* 5.7*  ALBUMIN 3.4* 2.7*  --  2.9*   No results for input(s): LIPASE, AMYLASE in the last 8760 hours. No results for input(s): AMMONIA in the last 8760 hours. CBC: Recent Labs    09/03/19 1400 09/04/19 0156 09/05/19 0242 11/25/19 1439 05/24/20 0943  WBC 5.9   < > 5.3 6.3 8.8  NEUTROABS 4.5  --   --  4,624 7.2  HGB 11.0*   < > 10.2* 11.2* 9.1*  HCT 34.9*   < > 32.0* 33.8* 29.4*  MCV 97.2   < > 95.2 93.6 96.4  PLT 217   < > 209 207 238   < > = values in this interval not displayed.   Lipid Panel: No results for input(s): CHOL, HDL, LDLCALC, TRIG, CHOLHDL, LDLDIRECT in the last 8760 hours. Lab Results  Component  Value Date   HGBA1C 5.4 04/08/2020    Procedures since last visit: CT Head Wo Contrast  Result Date: 05/24/2020 CLINICAL DATA:  Status post trauma. EXAM: CT HEAD WITHOUT CONTRAST TECHNIQUE: Contiguous axial images were obtained from the base of the skull through the vertex without intravenous contrast. COMPARISON:  December 16, 2019 FINDINGS: Brain: There is mild to moderate severity cerebral atrophy with widening of the extra-axial spaces and ventricular dilatation. There are areas of decreased attenuation within the white matter tracts of the supratentorial brain, consistent with microvascular  disease changes. Vascular: No hyperdense vessel or unexpected calcification. Skull: Normal. Negative for fracture or focal lesion. Sinuses/Orbits: Moderate severity right maxillary sinus mucosal thickening is seen. Other: None. IMPRESSION: 1. Mild to moderate severity cerebral atrophy. 2. No acute intracranial abnormality. 3. Moderate severity right maxillary sinus disease. Electronically Signed   By: Aram Candelahaddeus  Houston M.D.   On: 05/24/2020 12:19   CT Cervical Spine Wo Contrast  Result Date: 05/24/2020 CLINICAL DATA:  Status post trauma. EXAM: CT CERVICAL SPINE WITHOUT CONTRAST TECHNIQUE: Multidetector CT imaging of the cervical spine was performed without intravenous contrast. Multiplanar CT image reconstructions were also generated. COMPARISON:  December 16, 2019 FINDINGS: Alignment: There is approximately 1 mm anterolisthesis of the C4 vertebral body on C5. Approximately 1 mm retrolisthesis of the C5 vertebral body on C6 is also noted. Skull base and vertebrae: No acute fracture. Chronic changes are seen involving the body and tip of the dens. No primary bone lesion or focal pathologic process. Soft tissues and spinal canal: No prevertebral fluid or swelling. No visible canal hematoma. Disc levels: Marked severity anterior and lateral osteophyte formation is seen at the levels of C4-C5, C5-C6, C6-C7 and C7-T1. Mild to moderate severity endplate sclerosis is noted at the level of C3-C4. There is marked severity narrowing of the anterior atlantoaxial articulation. Moderate severity, predominant posterior intervertebral disc space narrowing is seen at the levels of C2-C3, C5-C6, C6-C7 and C7-T1. Bilateral marked severity multilevel facet joint hypertrophy is noted. Upper chest: Negative. Other: Moderate severity right maxillary sinus mucosal thickening is seen. IMPRESSION: 1. Marked severity multilevel degenerative changes, as described above. 2. No evidence of an acute fracture or subluxation. 3. Moderate  severity right maxillary sinus mucosal thickening. Electronically Signed   By: Aram Candelahaddeus  Houston M.D.   On: 05/24/2020 12:24    Assessment/Plan 1. Frequency of urination - ongoing every 1-2 hrs  - completed course of nitrofurantoin - POC Urinalysis Dipstick - Culture, Urine  2. Genitourinary syndrome of menopause -has this chronically along with her lichen sclerosis, but recent symptoms were worse again and she's completed nitrofurantoin which is only bacteriostatic so suspect infection persists -cont B6 which was started as a prophylaxis for this by urogyn -was also doing cranberry and D mannose at one point, but they're not on the list anymore  3. Neuropathy -cont b12, d/c gabapentin which has not helped at all, start cymbalta 20mg  and titrate up at 4 wks; also stop zoloft low dose in exchange for this to help mood   4. Lichen sclerosus -has periods where this is worse and causes her more vaginal discomfort  5. Orthostatic syncope -felt to be culprit for most recent episode on commode--suspect vasovagal  -push fluids -orders written for staff to get her in bed, lie her head back if she gets dizzy, syncopal  -do not hospitalize as per her longstanding wish  6. Proximal muscle weakness -PT eval and tx to help with muscle strength in proximal  thighs and for transfers  Labs/tests ordered:  Orders Placed This Encounter  Procedures  . Culture, Urine  . POC Urinalysis Dipstick   Next appt:  4 mos with Dr. Hyacinth Meeker and I will follow her symptoms with the palliative care team   Jimie Kuwahara L. Shreena Baines, D.O. Geriatrics [facility] [address], [place] [address] Cell Phone (Mon-Fri 8am-5pm):  [phone] On Call:  [phone] & follow prompts after 5pm & weekends Office Phone:  [phone] Office Fax:  [phone]

## 2020-06-03 NOTE — Patient Instructions (Signed)
Stop gabapentin and zoloft Start cymbalta PT

## 2020-06-05 LAB — URINE CULTURE
MICRO NUMBER:: 11663502
SPECIMEN QUALITY:: ADEQUATE

## 2020-06-06 NOTE — Progress Notes (Signed)
Urine still appears infected and she was still having symptoms at her appt. Let's start her on bactrim ds 800/160mg  po bid for 7 days.  This will go to [place] [facility] and this will need to be printed and signed, then sent to [place] (fax number is in top of my note from Thursday).

## 2020-06-08 ENCOUNTER — Other Ambulatory Visit: Payer: Self-pay | Admitting: *Deleted

## 2020-06-08 MED ORDER — SULFAMETHOXAZOLE-TRIMETHOPRIM 800-160 MG PO TABS: 1.0000 | ORAL_TABLET | Freq: Two times a day (BID) | ORAL | 0 refills | Status: DC

## 2020-06-08 NOTE — Telephone Encounter (Signed)
Reed, Tiffany L, DO  [facility] Urine still appears infected and she was still having symptoms at her appt.  Let's start her on bactrim ds 800/160mg  po bid for 7 days. This will go to [place] pharmacy and this will need to be printed and signed, then sent to [place] (fax number is in top of my note from Thursday).     Patient called regarding her Urine Culture Results. Patient notified and agreed.   Rx faxed to [facility] per patient and labs/order faxed to [place] Fax: [phone]  Pended Rx and sent to Dr. Renato Gails for approval due to HIGH ALERT Warning.

## 2020-06-08 NOTE — Addendum Note (Signed)
Addended by: Nelda Severe A on: 06/08/2020 10:54 AM   Modules accepted: Orders

## 2020-06-08 NOTE — Telephone Encounter (Signed)
Rx sent to pharmacy per Dr. Renato Gails.  Faxed Copy of Rx and Lab/Order to [facility].

## 2020-06-17 DIAGNOSIS — S8001XD Contusion of right knee, subsequent encounter: Secondary | ICD-10-CM | POA: Diagnosis not present

## 2020-06-17 DIAGNOSIS — M1711 Unilateral primary osteoarthritis, right knee: Secondary | ICD-10-CM | POA: Diagnosis not present

## 2020-06-17 DIAGNOSIS — I129 Hypertensive chronic kidney disease with stage 1 through stage 4 chronic kidney disease, or unspecified chronic kidney disease: Secondary | ICD-10-CM | POA: Diagnosis not present

## 2020-06-17 DIAGNOSIS — N183 Chronic kidney disease, stage 3 unspecified: Secondary | ICD-10-CM | POA: Diagnosis not present

## 2020-06-17 DIAGNOSIS — G629 Polyneuropathy, unspecified: Secondary | ICD-10-CM | POA: Diagnosis not present

## 2020-06-17 DIAGNOSIS — I951 Orthostatic hypotension: Secondary | ICD-10-CM | POA: Diagnosis not present

## 2020-06-23 ENCOUNTER — Other Ambulatory Visit: Payer: Self-pay

## 2020-06-23 ENCOUNTER — Encounter: Payer: Self-pay | Admitting: Family

## 2020-06-23 ENCOUNTER — Ambulatory Visit (INDEPENDENT_AMBULATORY_CARE_PROVIDER_SITE_OTHER): Payer: Medicare Other | Admitting: Family

## 2020-06-23 VITALS — BP 100/70 | HR 77 | Temp 97.7°F | Resp 16 | Ht 67.0 in | Wt 129.4 lb

## 2020-06-23 DIAGNOSIS — S80212A Abrasion, left knee, initial encounter: Secondary | ICD-10-CM | POA: Diagnosis not present

## 2020-06-23 DIAGNOSIS — R55 Syncope and collapse: Secondary | ICD-10-CM

## 2020-06-23 DIAGNOSIS — I951 Orthostatic hypotension: Secondary | ICD-10-CM

## 2020-06-23 DIAGNOSIS — R531 Weakness: Secondary | ICD-10-CM

## 2020-06-23 NOTE — Patient Instructions (Signed)
-  change Cymbalta 20 mg capsule to one by mouth every other day x 3 weeks then stop.Notify provider if symptoms worsen or fail to improve   - labs done today will call you with results.  - EKG done today showed no changes from previous readings

## 2020-06-23 NOTE — Progress Notes (Signed)
Provider: Zyion Doxtater FNP-C  Wardell Honour, MD  Patient Care Team: Wardell Honour, MD as PCP - General (Family Medicine) Sydnee Cabal, MD as Consulting Physician (Orthopedic Surgery) Rosalin Hawking, MD as Consulting Physician (Neurology) Warden Fillers, MD as Consulting Physician (Ophthalmology) Zadie Rhine Clent Demark, MD as Consulting Physician (Ophthalmology) Gayland Curry, DO (Geriatric Medicine)  Extended Emergency Contact Information Primary Emergency Contact: Smith,Kathy Address: [address] [place], [place] [address] Johnnette Litter of [place] Phone: [phone] Mobile Phone: [phone] Relation: Daughter Secondary Emergency Contact: Festus Holts, [place] of [place] Mobile Phone: [phone] Relation: Friend  Code Status:  DNR Goals of care: Advanced Directive information Advanced Directives 06/23/2020  Does Patient Have a Medical Advance Directive? Yes  Type of Paramedic of [place];Living will;Out of facility DNR (pink MOST or yellow form)  Does patient want to make changes to medical advance directive? No - Patient declined  Copy of [place] in Chart? Yes - validated most recent copy scanned in chart (See row information)  Would patient like information on creating a medical advance directive? -  Pre-existing out of facility DNR order (yellow form or pink MOST form) -     Chief Complaint  Patient presents with  . Acute Visit    Frequent falling.  . Fall Risk     High Fall Risk    HPI:  Pt is a 87 y.o. female seen today for an acute visit for evaluation of frequent falling.she is here with her daughter who provides additional information.Has lightheadedness then pass out.Has had multiple falls passing out 1-2 times per day in the facility. Passing out has become more frequent. Appetite is not good. Not drinking enough but tries.states tired of people telling to drink  water  She does not want to be evaluated in the hospital.States has seen the cardiologist about her Orthostatic Blood pressure and there's nothing they can do for her.  Has been working with Physical therapy at [facility]. Daughter would like Cymbalta to be discontinued thinks it's contributing to low blood pressure.Has been taking Cymbalta for pain rather than depression.     Past Medical History:  Diagnosis Date  . Cervicalgia   . Chronic kidney disease   . Chronic kidney disease, stage III (moderate) (HCC)   . Dermatophytosis of groin and perianal area   . Dyspnea   . Dysrhythmia   . Hyperlipidemia LDL goal < 100   . Insomnia, unspecified   . Muscle weakness (generalized)   . Orthostatic hypotension   . Osteoarthrosis, unspecified whether generalized or localized, lower leg    osteoarthritis - upper thoracic area, left knee.  . Other abnormal glucose   . Other B-complex deficiencies   . Other malaise and fatigue   . Palpitations   . Senile cataract, unspecified   . Shortness of breath   . Stroke [facility])    small "stroke" - right sided weakness for a day.  . Stroke ([place])   . Tachycardia, unspecified   . Undiagnosed cardiac murmurs   . Unspecified essential hypertension   . Unspecified vitamin D deficiency   . Urinary tract infection    Past Surgical History:  Procedure Laterality Date  . CATARACT EXTRACTION W/PHACO Bilateral 1994   Dr. Katy Fitch  . CATARACT EXTRACTION, BILATERAL Bilateral   . EYE SURGERY     cataract  .  RUPTURED GLOBE EXPLORATION AND REPAIR Left 09/03/2019   Procedure: Scleral laceration repair LEFT EYE, exam under anesthesia LEFT EYE;  Surgeon: Debbra Riding, MD;  Location: [facility];  Service: Ophthalmology;  Laterality: Left;  . TOE AMPUTATION  2002  . TONSILLECTOMY  1944  . TONSILLECTOMY     younger yrs  . TOTAL KNEE ARTHROPLASTY Left 01/15/2015   Procedure: LEFT TOTAL KNEE ARTHROPLASTY;  Surgeon: Sydnee Cabal, MD;  Location: [facility];   Service: Orthopedics;  Laterality: Left;  . TUBAL LIGATION  1951    Allergies  Allergen Reactions  . Ace Inhibitors Cough  . Lisinopril Cough  . Statins Other (See Comments)    Body aches and pains    Outpatient Encounter Medications as of 06/23/2020  Medication Sig  . Acetaminophen 500 MG capsule Take 2 capsules (1,000 mg total) by mouth at bedtime.  . carvedilol (COREG) 6.25 MG tablet Take 6.25 mg by mouth 2 (two) times daily.  . Cholecalciferol (VITAMIN D3) 10 MCG (400 UNIT) CAPS Take 400 Units by mouth daily.  . clobetasol cream (TEMOVATE) 4.40 % Apply 1 application topically 2 (two) times daily.  Marland Kitchen conjugated estrogens (PREMARIN) vaginal cream Place 1 Applicatorful vaginally daily.  . Cyanocobalamin 1000 MCG CAPS Take one capsule by mouth once daily.  . DULoxetine (CYMBALTA) 20 MG capsule Take 1 capsule (20 mg total) by mouth daily.  . Multiple Vitamins-Minerals (PRESERVISION AREDS 2+MULTI VIT) CAPS Take 1 capsule by mouth 2 (two) times daily.   . ondansetron (ZOFRAN) 4 MG tablet Take 4 mg by mouth.  . polyethylene glycol (MIRALAX / GLYCOLAX) 17 g packet Take 17 g by mouth as needed.  . pyridoxine (B-6) 100 MG tablet Take 100 mg by mouth as needed. For burning upon urination.  . sulfamethoxazole-trimethoprim (BACTRIM DS) 800-160 MG tablet Take 1 tablet by mouth 2 (two) times daily. For 7 days  . traMADol (ULTRAM) 50 MG tablet Take 1 tablet (50 mg total) by mouth 2 (two) times daily as needed for moderate pain (for pain).  . traZODone (DESYREL) 50 MG tablet Take 0.5 tablets (25 mg total) by mouth at bedtime.   No facility-administered encounter medications on file as of 06/23/2020.    Review of Systems  Constitutional: Positive for appetite change. Negative for chills, fatigue and fever.  HENT: Negative for congestion, rhinorrhea, sinus pressure, sinus pain, sneezing and sore throat.   Eyes: Negative for discharge, redness and itching.  Respiratory: Negative for cough, chest  tightness, shortness of breath and wheezing.   Cardiovascular: Negative for chest pain, palpitations and leg swelling.  Gastrointestinal: Negative for abdominal distention, abdominal pain, constipation, diarrhea, nausea and vomiting.  Endocrine: Negative for cold intolerance, heat intolerance, polydipsia, polyphagia and polyuria.  Genitourinary: Negative for difficulty urinating, dysuria, flank pain, frequency and urgency.  Musculoskeletal: Positive for arthralgias and gait problem. Negative for joint swelling and myalgias.  Skin: Negative for color change, pallor and rash.  Neurological: Positive for syncope and light-headedness. Negative for dizziness, tremors, speech difficulty, weakness and headaches.       Generalized weakness  Has had syncope 1-2 times per day   Hematological: Does not bruise/bleed easily.  Psychiatric/Behavioral: Positive for agitation. Negative for behavioral problems and sleep disturbance. The patient is not nervous/anxious.     Immunization History  Administered Date(s) Administered  . Fluad Quad(high Dose 65+) 12/26/2018  . Influenza Split 11/18/2008, 12/28/2009  . Influenza Whole 12/24/2012  . Influenza, High Dose Seasonal PF 12/03/2017, 02/02/2020  . Influenza,inj,Quad PF,6+ Mos  12/21/2016  . Influenza-Unspecified 01/13/2014, 12/19/2014, 01/15/2016  . PFIZER(Purple Top)SARS-COV-2 Vaccination 05/22/2019, 06/18/2019, 12/30/2019  . PPD Test 01/18/2015  . Pneumococcal Conjugate-13 01/12/2014  . Pneumococcal Polysaccharide-23 05/22/2011  . Tdap 05/22/2011, 12/16/2019  . Zoster 12/20/2010   Pertinent  Health Maintenance Due  Topic Date Due  . INFLUENZA VACCINE  10/18/2020  . PNA vac Low Risk Adult  Completed  . DEXA SCAN  Addressed   Fall Risk  06/23/2020 04/08/2020 01/29/2020 11/25/2019 10/05/2019  Falls in the past year? 1 0 _0 Comment - - September 20th, 2021 - -  Number falls in past yr: 1 0 _1 Injury with Fall? _2 Risk for fall due to : -  - History of fall(s);Impaired balance/gait;Impaired mobility - History of fall(s);Impaired balance/gait;Impaired mobility;Impaired vision;Medication side effect  Follow up - - Falls evaluation completed;Education provided;Falls prevention discussed - Falls evaluation completed;Education provided;Falls prevention discussed;Follow up appointment   Functional Status Survey:    Vitals:   06/23/20 1348  BP: 100/70  Pulse: 77  Resp: 16  Temp: 97.7 F (36.5 C)  SpO2: 96%  Weight: 129 lb 6.4 oz (58.7 kg)  Height: _3  (1.702 m)   Body mass index is 20.27 kg/m. Physical Exam Vitals reviewed.  Constitutional:      General: She is not in acute distress.    Appearance: She is normal weight. She is not ill-appearing.  HENT:     Mouth/Throat:     Mouth: Mucous membranes are moist.     Pharynx: Oropharynx is clear. No oropharyngeal exudate or posterior oropharyngeal erythema.  Eyes:     General: No scleral icterus.       Right eye: No discharge.        Left eye: No discharge.     Conjunctiva/sclera: Conjunctivae normal.     Pupils: Pupils are equal, round, and reactive to light.  Neck:     Vascular: No carotid bruit.  Cardiovascular:     Rate and Rhythm: Normal rate and regular rhythm.     Pulses: Normal pulses.     Heart sounds: Normal heart sounds. No murmur heard. No friction rub. No gallop.   Pulmonary:     Effort: Pulmonary effort is normal. No respiratory distress.     Breath sounds: Normal breath sounds. No wheezing, rhonchi or rales.  Chest:     Chest wall: No tenderness.  Abdominal:     General: Bowel sounds are normal. There is no distension.     Palpations: Abdomen is soft. There is no mass.     Tenderness: There is no abdominal tenderness. There is no right CVA tenderness, left CVA tenderness, guarding or rebound.  Musculoskeletal:        General: No swelling or tenderness.     Cervical back: Normal range of motion. No rigidity or tenderness.     Right lower leg: No  edema.     Left lower leg: No edema.     Comments: Unsteady gait on wheelchair during visit   Lymphadenopathy:     Cervical: No cervical adenopathy.  Skin:    General: Skin is warm and dry.     Coloration: Skin is not pale.     Findings: No erythema or rash.     Comments: Abrasion to left lateral upper leg and right leg dressing intact declined removal " there's really nothing to worry about there".   Neurological:     Mental Status: She  is alert.  Psychiatric:        Mood and Affect: Mood normal.        Speech: Speech normal.        Behavior: Behavior is agitated.        Thought Content: Thought content normal.     Labs reviewed: Recent Labs    09/05/19 0242 11/25/19 1439 05/24/20 0943  NA 134* 141 139  K 3.9 4.0 4.6  CL 103 105 111  CO2 _0 GLUCOSE 118* 129 141*  BUN 23 24 36*  CREATININE 1.14* 1.32* 1.66*  CALCIUM 8.1* 8.9 8.2*  MG 1.8  --   --    Recent Labs    09/03/19 1400 09/05/19 0242 11/25/19 1439 05/24/20 0943  AST 45* _1 ALT 202* 94* 11 14  ALKPHOS 162* 106  --  53  BILITOT 1.4* 0.9 0.9 0.7  PROT 5.9* 4.9* 5.6* 5.7*  ALBUMIN 3.4* 2.7*  --  2.9*   Recent Labs    09/03/19 1400 09/04/19 0156 09/05/19 0242 11/25/19 1439 05/24/20 0943  WBC 5.9   < > 5.3 6.3 8.8  NEUTROABS 4.5  --   --  4,624 7.2  HGB 11.0*   < > 10.2* 11.2* 9.1*  HCT 34.9*   < > 32.0* 33.8* 29.4*  MCV 97.2   < > 95.2 93.6 96.4  PLT 217   < > 209 207 238   < > = values in this interval not displayed.   Lab Results  Component Value Date   TSH 2.44 06/05/2019   Lab Results  Component Value Date   HGBA1C 5.4 04/08/2020   Lab Results  Component Value Date   CHOL 205 (H) 04/17/2016   HDL 26 (L) 04/17/2016   LDLCALC 128 (H) 04/17/2016   TRIG 254 (H) 04/17/2016   CHOLHDL 7.9 (H) 04/17/2016    Significant Diagnostic Results in last 30 days:  No results found.  Assessment/Plan 1. Orthostatic syncope Significant blood pressure drop with standing. - CBC  with Differential/Platelet - CMP with eGFR([facility]) - EKG 12-Lead indicates SR with right bundle branch block with left bifascicular block and nonspecific T-abnormality HR 90 b/min no chanages compared to previous EKG done 05/24/2020 Advised to avoid getting up too quickly. - decline ED and cardiology referral  Wean off Cymbalta per daughter's request.20 mg capsule every other day x 3 weeks then stop.   2. Generalized weakness Afebrile.suspect due to soft blood pressure.HR stable Will obtain lab work to rule out other infectious or metabolic etiologies.  - CBC with Differential/Platelet - CMP with eGFR([facility]) - EKG 12-Lead  3. Abrasion of left knee, initial encounter Not visualized declined states managed by facility Nurse.   4. Syncope, unspecified syncope type Has had 1-2 episode daily suspect due to her Orthostatic blood pressure. - CBC with Differential/Platelet - CMP with eGFR([facility]) - Culture, Urine  Family/ staff Communication: Reviewed plan of care with patient and daughter.  Labs/tests ordered:  - CBC with Differential/Platelet - CMP with eGFR([facility]) - Culture, Urine  Next Appointment: 1 month for follow up orthostatic blood pressure ,syncope,depression     Shaye Elling C Josh Nicolosi, NP

## 2020-06-25 LAB — CBC WITH DIFFERENTIAL/PLATELET
Absolute Monocytes: 421 cells/uL (ref 200–950)
Basophils Absolute: 52 cells/uL (ref 0–200)
Basophils Relative: 0.6 %
Eosinophils Absolute: 77 cells/uL (ref 15–500)
Eosinophils Relative: 0.9 %
HCT: 36.2 % (ref 35.0–45.0)
Hemoglobin: 11.2 g/dL — ABNORMAL LOW (ref 11.7–15.5)
Lymphs Abs: 1505 cells/uL (ref 850–3900)
MCH: 28 pg (ref 27.0–33.0)
MCHC: 30.9 g/dL — ABNORMAL LOW (ref 32.0–36.0)
MCV: 90.5 fL (ref 80.0–100.0)
MPV: 9.5 fL (ref 7.5–12.5)
Monocytes Relative: 4.9 %
Neutro Abs: 6545 cells/uL (ref 1500–7800)
Neutrophils Relative %: 76.1 %
Platelets: 332 10*3/uL (ref 140–400)
RBC: 4 10*6/uL (ref 3.80–5.10)
RDW: 13.1 % (ref 11.0–15.0)
Total Lymphocyte: 17.5 %
WBC: 8.6 10*3/uL (ref 3.8–10.8)

## 2020-06-25 LAB — COMPLETE METABOLIC PANEL WITH GFR
AG Ratio: 1.4 (calc) (ref 1.0–2.5)
ALT: 15 U/L (ref 6–29)
AST: 18 U/L (ref 10–35)
Albumin: 3.9 g/dL (ref 3.6–5.1)
Alkaline phosphatase (APISO): 73 U/L (ref 37–153)
BUN/Creatinine Ratio: 35 (calc) — ABNORMAL HIGH (ref 6–22)
BUN: 45 mg/dL — ABNORMAL HIGH (ref 7–25)
CO2: 19 mmol/L — ABNORMAL LOW (ref 20–32)
Calcium: 8.9 mg/dL (ref 8.6–10.4)
Chloride: 106 mmol/L (ref 98–110)
Creat: 1.27 mg/dL — ABNORMAL HIGH (ref 0.60–0.88)
GFR, Est African American: 41 mL/min/{1.73_m2} — ABNORMAL LOW (ref >=60)
GFR, Est Non African American: 36 mL/min/{1.73_m2} — ABNORMAL LOW (ref >=60)
Globulin: 2.7 g/dL (calc) (ref 1.9–3.7)
Glucose, Bld: 106 mg/dL (ref 65–139)
Potassium: 5.2 mmol/L (ref 3.5–5.3)
Sodium: 137 mmol/L (ref 135–146)
Total Bilirubin: 0.4 mg/dL (ref 0.2–1.2)
Total Protein: 6.6 g/dL (ref 6.1–8.1)

## 2020-06-25 LAB — URINE CULTURE
MICRO NUMBER:: 11739001
SPECIMEN QUALITY:: ADEQUATE

## 2020-07-03 ENCOUNTER — Encounter (HOSPITAL_COMMUNITY): Payer: Self-pay

## 2020-07-03 ENCOUNTER — Other Ambulatory Visit: Payer: Self-pay

## 2020-07-03 ENCOUNTER — Emergency Department (HOSPITAL_COMMUNITY)
Admission: EM | Admit: 2020-07-03 | Discharge: 2020-07-03 | Disposition: A | Discharge status: Home / Self Care | Payer: Medicare Other | Attending: Emergency Medicine | Admitting: Emergency Medicine

## 2020-07-03 ENCOUNTER — Emergency Department (HOSPITAL_COMMUNITY): Payer: Medicare Other

## 2020-07-03 DIAGNOSIS — S0101XA Laceration without foreign body of scalp, initial encounter: Secondary | ICD-10-CM | POA: Diagnosis not present

## 2020-07-03 DIAGNOSIS — W01198A Fall on same level from slipping, tripping and stumbling with subsequent striking against other object, initial encounter: Secondary | ICD-10-CM | POA: Insufficient documentation

## 2020-07-03 DIAGNOSIS — Z96652 Presence of left artificial knee joint: Secondary | ICD-10-CM | POA: Insufficient documentation

## 2020-07-03 DIAGNOSIS — N183 Chronic kidney disease, stage 3 unspecified: Secondary | ICD-10-CM | POA: Diagnosis not present

## 2020-07-03 DIAGNOSIS — S51811A Laceration without foreign body of right forearm, initial encounter: Secondary | ICD-10-CM | POA: Insufficient documentation

## 2020-07-03 DIAGNOSIS — Y92002 Bathroom of unspecified non-institutional (private) residence single-family (private) house as the place of occurrence of the external cause: Secondary | ICD-10-CM | POA: Insufficient documentation

## 2020-07-03 DIAGNOSIS — Z8673 Personal history of transient ischemic attack (TIA), and cerebral infarction without residual deficits: Secondary | ICD-10-CM | POA: Diagnosis not present

## 2020-07-03 DIAGNOSIS — I129 Hypertensive chronic kidney disease with stage 1 through stage 4 chronic kidney disease, or unspecified chronic kidney disease: Secondary | ICD-10-CM | POA: Diagnosis not present

## 2020-07-03 DIAGNOSIS — S0990XA Unspecified injury of head, initial encounter: Secondary | ICD-10-CM | POA: Diagnosis present

## 2020-07-03 DIAGNOSIS — Z79899 Other long term (current) drug therapy: Secondary | ICD-10-CM | POA: Insufficient documentation

## 2020-07-03 DIAGNOSIS — W19XXXA Unspecified fall, initial encounter: Secondary | ICD-10-CM

## 2020-07-03 MED ORDER — LIDOCAINE HCL (PF) 1 % IJ SOLN
5.0000 mL | Freq: Once | INTRAMUSCULAR | Status: AC
  Administered 2020-07-03: 5 mL
  Filled 2020-07-03: qty 30

## 2020-07-03 MED ORDER — ACETAMINOPHEN 325 MG PO TABS
650.0000 mg | ORAL_TABLET | Freq: Once | ORAL | Status: AC
  Administered 2020-07-03: 650 mg via ORAL
  Filled 2020-07-03: qty 2

## 2020-07-03 NOTE — Discharge Instructions (Addendum)
Please keep the right forearm covered.  Change dressing twice daily.  Use a nonstick pad, and cover with a gauze.

## 2020-07-03 NOTE — ED Triage Notes (Signed)
Pt comes from [facility], fell after using bathroom and hit back of head. Denies anticoags, no loc, bleeding controlled, skin tear on rt forearm. Hx of hypotension, is DNR.

## 2020-07-03 NOTE — ED Notes (Signed)
Pt. Refused purwick. Commode at bedside.

## 2020-07-03 NOTE — ED Provider Notes (Signed)
[facility]-EMERGENCY DEPT Provider Note   CSN: [id number] Arrival date & time: 07/03/20  1142     History No chief complaint on file.   Lisa [name] is a 87 y.o. female.  The patient was attempting to get up from the toilet.  She was transferring to her wheelchair when she slipped and struck her head on the edge of a door frame.  She is currently experiencing some mild pain in her head, but otherwise she is feeling fine.  No symptoms prior to this fall.  The history is provided by the patient.  Fall This is a new problem. The current episode started less than 1 hour ago. The problem occurs constantly. The problem has not changed since onset.Pertinent negatives include no chest pain, no abdominal pain, no headaches and no shortness of breath. Nothing aggravates the symptoms. Nothing relieves the symptoms. She has tried nothing for the symptoms. The treatment provided no relief.       Past Medical History:  Diagnosis Date  . Cervicalgia   . Chronic kidney disease   . Chronic kidney disease, stage III (moderate) (HCC)   . Dermatophytosis of groin and perianal area   . Dyspnea   . Dysrhythmia   . Hyperlipidemia LDL goal < 100   . Insomnia, unspecified   . Muscle weakness (generalized)   . Orthostatic hypotension   . Osteoarthrosis, unspecified whether generalized or localized, lower leg    osteoarthritis - upper thoracic area, left knee.  . Other abnormal glucose   . Other B-complex deficiencies   . Other malaise and fatigue   . Palpitations   . Senile cataract, unspecified   . Shortness of breath   . Stroke [facility])    small "stroke" - right sided weakness for a day.  . Stroke (HCC)   . Tachycardia, unspecified   . Undiagnosed cardiac murmurs   . Unspecified essential hypertension   . Unspecified vitamin D deficiency   . Urinary tract infection     Patient Active Problem List   Diagnosis Date Noted  . Intermediate stage nonexudative  age-related macular degeneration of left eye 01/05/2020  . Prolapse of iris in recent wound, left, subsequent encounter 09/16/2019  . Orthostatic syncope 09/04/2019  . Scleral laceration of left eye 09/03/2019  . Syncope and collapse 09/03/2019  . Generalized weakness 09/03/2019  . Recurrent UTI 09/03/2019  . Branch retinal vein occlusion with macular edema of right eye 07/07/2019  . Branch retinal vein occlusion with neovascularization of right eye 07/07/2019  . Cystoid macular edema of right eye 07/07/2019  . Exudative age-related macular degeneration of left eye with inactive choroidal neovascularization (HCC) 07/07/2019  . Genitourinary syndrome of menopause 07/01/2019  . Other fatigue 12/03/2017  . Anemia 12/03/2017  . Balance problem 12/03/2017  . Lichen sclerosus of female genitalia 12/03/2017  . Syncope 07/17/2017  . Neck pain, chronic 07/05/2015  . Status post total left knee replacement 07/05/2015  . Primary osteoarthritis of left knee 01/15/2015  . S/P knee replacement 01/15/2015  . Dry skin 01/28/2014  . HLD (hyperlipidemia) 01/06/2014  . Essential hypertension 01/06/2014  . B12 deficiency 09/29/2013  . Mixed hyperlipidemia 09/29/2013  . Hypertension, renal disease, stage 1-4 or unspecified chronic kidney disease 06/26/2013  . Seasonal allergies 06/26/2013  . CVA (cerebral vascular accident) (HCC) 04/09/2013  . Stroke (HCC) 04/09/2013  . Insomnia 09/14/2012  . Hyperlipidemia   . Osteoarthritis of right knee   . Undiagnosed cardiac murmurs   . Shortness  of breath   . Chronic kidney disease, stage III (moderate) (HCC)   . Cervicalgia   . Dyspnea 11/30/2008    Past Surgical History:  Procedure Laterality Date  . CATARACT EXTRACTION W/PHACO Bilateral 1994   Dr. Dione BoozeGroat  . CATARACT EXTRACTION, BILATERAL Bilateral   . EYE SURGERY     cataract  . RUPTURED GLOBE EXPLORATION AND REPAIR Left 09/03/2019   Procedure: Scleral laceration repair LEFT EYE, exam under  anesthesia LEFT EYE;  Surgeon: Olivia CanterGroat, Richard Scott, MD;  [facility];  Service: Ophthalmology;  Laterality: Left;  . TOE AMPUTATION  2002  . TONSILLECTOMY  1944  . TONSILLECTOMY     younger yrs  . TOTAL KNEE ARTHROPLASTY Left 01/15/2015   Procedure: LEFT TOTAL KNEE ARTHROPLASTY;  Surgeon: Eugenia Mcalpineobert Collins, MD;  Location: [facility] ORS;  Service: Orthopedics;  Laterality: Left;  . TUBAL LIGATION  1951     OB History   No obstetric history on file.     Family History  Problem Relation Age of Onset  . Cerebral aneurysm Mother   . Heart attack Father   . Alzheimer's disease Sister   . Emphysema Sister   . Stroke Sister   . Alzheimer's disease Sister   . Arthritis Sister   . Stroke Sister        Age 85   . Lung cancer Son     Social History   Tobacco Use  . Smoking status: Never Smoker  . Smokeless tobacco: Never Used  Vaping Use  . Vaping Use: Never used  Substance Use Topics  . Alcohol use: Not Currently    Comment: rare social every 3 or 4 months wine  . Drug use: No    Home Medications Prior to Admission medications   Medication Sig Start Date End Date Taking? Authorizing Provider  Acetaminophen 500 MG capsule Take 2 capsules (1,000 mg total) by mouth at bedtime. 09/25/19   Reed, Tiffany L, DO  carvedilol (COREG) 6.25 MG tablet Take 6.25 mg by mouth 2 (two) times daily. 06/05/19   [provider]  Cholecalciferol (VITAMIN D3) 10 MCG (400 UNIT) CAPS Take 400 Units by mouth daily.    [provider]  clobetasol cream (TEMOVATE) 0.05 % Apply 1 application topically 2 (two) times daily. 01/29/20   Reed, Tiffany L, DO  conjugated estrogens (PREMARIN) vaginal cream Place 1 Applicatorful vaginally daily. 09/06/19 09/05/20  Pokhrel, Rebekah ChesterfieldLaxman, MD  Cyanocobalamin 1000 MCG CAPS Take one capsule by mouth once daily.    [provider]  DULoxetine (CYMBALTA) 20 MG capsule Take 1 capsule (20 mg total) by mouth daily. 06/03/20   Reed, Tiffany L, DO  Multiple  Vitamins-Minerals (PRESERVISION AREDS 2+MULTI VIT) CAPS Take 1 capsule by mouth 2 (two) times daily.     [provider]  ondansetron (ZOFRAN) 4 MG tablet Take 4 mg by mouth.    [provider]  polyethylene glycol (MIRALAX / GLYCOLAX) 17 g packet Take 17 g by mouth as needed.    [provider]  pyridoxine (B-6) 100 MG tablet Take 100 mg by mouth as needed. For burning upon urination.    [provider]  sulfamethoxazole-trimethoprim (BACTRIM DS) 800-160 MG tablet Take 1 tablet by mouth 2 (two) times daily. For 7 days 06/08/20   Bufford Spikeseed, Tiffany L, DO  traMADol (ULTRAM) 50 MG tablet Take 1 tablet (50 mg total) by mouth 2 (two) times daily as needed for moderate pain (for pain). 04/12/20 04/12/21  Ngetich, Donalee Citrininah C, NP  traZODone (DESYREL) 50 MG tablet Take 0.5 tablets (25 mg total) by mouth at bedtime. 09/25/19   Reed, Tiffany L, DO    Allergies    Ace inhibitors, Lisinopril, and Statins  Review of Systems   Review of Systems  Constitutional: Negative for chills and fever.  HENT: Negative for ear pain and sore throat.   Eyes: Negative for pain and visual disturbance.  Respiratory: Negative for cough and shortness of breath.   Cardiovascular: Negative for chest pain and palpitations.  Gastrointestinal: Negative for abdominal pain and vomiting.  Genitourinary: Negative for dysuria and hematuria.  Musculoskeletal: Negative for arthralgias and back pain.  Skin: Positive for wound. Negative for color change and rash.  Neurological: Negative for seizures, syncope and headaches.  All other systems reviewed and are negative.   Physical Exam Updated Vital Signs BP (!) 186/88   Pulse 82   Temp 98.3 F (36.8 C) (Oral)   Resp 18   Ht 5\' 7"  (1.702 m)   Wt 64 kg   SpO2 99%   BMI 22.08 kg/m   Physical Exam Vitals and nursing note reviewed.  HENT:     Head: Normocephalic. Laceration present.      Comments: Small, linear laceration at the right temporal aspect  of the scalp Eyes:     General: No scleral icterus. Pulmonary:     Effort: Pulmonary effort is normal. No respiratory distress.  Musculoskeletal:     Cervical back: Normal range of motion.  Skin:    General: Skin is warm and dry.     Comments: Complex, superficial skin tear over the dorsum of the right forearm  Neurological:     Mental Status: She is alert.  Psychiatric:        Mood and Affect: Mood normal.     ED Results / Procedures / Treatments   Labs (all labs ordered are listed, but only abnormal results are displayed) Labs Reviewed - No data to display  EKG None  Radiology CT Head Wo Contrast  Result Date: 07/03/2020 CLINICAL DATA:  Status post fall, hitting back of head. EXAM: CT HEAD WITHOUT CONTRAST TECHNIQUE: Contiguous axial images were obtained from the base of the skull through the vertex without intravenous contrast. COMPARISON:  May 24 2020 FINDINGS: Brain: No evidence of acute infarction, hemorrhage, hydrocephalus, extra-axial collection or mass lesion/mass effect. There is chronic diffuse atrophy. Chronic bilateral periventricular white matter small vessel ischemic changes are noted. Vascular: No hyperdense vessels are noted. Skull: Normal. Negative for fracture or focal lesion. Sinuses/Orbits: No acute finding. Other: None. IMPRESSION: 1. No focal acute intracranial abnormality identified. 2. Chronic diffuse atrophy. Chronic bilateral periventricular white matter small vessel ischemic change. Electronically Signed   By: [birth year]1979 M.D.   On: 07/03/2020 14:18    Procedures .04/18/2022Laceration Repair  Date/Time: 07/03/2020 2:49 PM Performed by: 07/05/2020, MD Authorized by: Koleen Distance, MD   Consent:    Consent obtained:  Verbal   Consent given by:  Patient   Risks, benefits, and alternatives were discussed: yes     Risks discussed:  Infection, need for additional repair, nerve damage, pain, poor cosmetic result, poor wound healing, retained foreign body  and vascular damage   Alternatives discussed:  No treatment, delayed treatment, observation and referral Universal protocol:    Immediately prior to procedure, a time out was called: yes     Patient identity confirmed:  Verbally with patient Anesthesia:    Anesthesia method:  Local infiltration  Local anesthetic:  Lidocaine 1% w/o epi Laceration details:    Location:  Scalp   Scalp location:  R temporal   Length (cm):  2   Depth (mm):  5 Pre-procedure details:    Preparation:  Patient was prepped and draped in usual sterile fashion Exploration:    Limited defect created (wound extended): no     Hemostasis achieved with:  Direct pressure   Wound exploration: wound explored through full range of motion and entire depth of wound visualized     Wound extent: no foreign bodies/material noted, no muscle damage noted, no nerve damage noted, no underlying fracture noted and no vascular damage noted     Contaminated: no   Treatment:    Area cleansed with:  Povidone-iodine   Amount of cleaning:  Standard   Irrigation solution:  Sterile saline   Irrigation volume:  10 cc   Irrigation method:  Syringe   Visualized foreign bodies/material removed: no     Debridement:  None   Undermining:  None   Scar revision: no   Skin repair:    Repair method:  Staples   Number of staples:  2 Approximation:    Approximation:  Close Repair type:    Repair type:  Simple Post-procedure details:    Dressing:  Open (no dressing)   Procedure completion:  Tolerated well, no immediate complications .Marland KitchenLaceration Repair  Date/Time: 07/03/2020 2:50 PM Performed by: Koleen Distance, MD Authorized by: Koleen Distance, MD   Consent:    Consent obtained:  Verbal   Consent given by:  Patient   Risks discussed:  Infection, need for additional repair, pain, poor cosmetic result, poor wound healing, nerve damage, retained foreign body, tendon damage and vascular damage   Alternatives discussed:  No treatment,  delayed treatment, observation and referral Universal protocol:    Immediately prior to procedure, a time out was called: yes     Patient identity confirmed:  Verbally with patient Laceration details:    Location:  Shoulder/arm   Shoulder/arm location:  R lower arm   Length (cm):  20 (There was almost a cobblestone pattern of skin tears.  20 cm is an estimation of each of the edges of the rounded tears.)   Depth (mm):  1 Pre-procedure details:    Preparation:  Patient was prepped and draped in usual sterile fashion Exploration:    Limited defect created (wound extended): no     Hemostasis achieved with:  Direct pressure   Wound exploration: wound explored through full range of motion and entire depth of wound visualized     Wound extent: no foreign bodies/material noted, no muscle damage noted, no nerve damage noted, no underlying fracture noted and no vascular damage noted     Contaminated: no   Treatment:    Area cleansed with:  Saline   Amount of cleaning:  Standard   Irrigation solution:  Sterile saline   Irrigation volume:  10   Irrigation method:  Syringe   Visualized foreign bodies/material removed: no     Debridement:  None Skin repair:    Repair method:  Tissue adhesive Approximation:    Approximation:  Loose Repair type:    Repair type:  Simple Post-procedure details:    Dressing:  Non-adherent dressing   Procedure completion:  Tolerated well, no immediate complications     Medications Ordered in ED Medications  lidocaine (PF) (XYLOCAINE) 1 % injection 5 mL (has no administration in time range)  acetaminophen (TYLENOL) tablet  650 mg (has no administration in time range)    ED Course  I have reviewed the triage vital signs and the nursing notes.  Pertinent labs & imaging results that were available during my care of the patient were reviewed by me and considered in my medical decision making (see chart for details).    MDM Rules/Calculators/A&P                           Lisa [name] presents after a mechanical fall.  She has a scalp laceration but no evidence of intracranial trauma on head CT.  Her forearm lacerations are more skin tears and would not respond well to sutures.  Nonetheless, I loosely approximated them with Dermabond in order to facilitate healing.  She was given instructions on wound care and return precautions. Final Clinical Impression(s) / ED Diagnoses Final diagnoses:  Fall, initial encounter  Laceration of scalp, initial encounter  Skin tear of right forearm without complication, initial encounter    Rx / DC Orders ED Discharge Orders    None       Koleen Distance, MD 07/03/20 1452

## 2020-07-03 NOTE — ED Notes (Signed)
Wound dressing done. Wound on the head clean around the staple

## 2020-07-12 ENCOUNTER — Encounter: Payer: Self-pay | Admitting: Nurse Practitioner

## 2020-07-12 ENCOUNTER — Ambulatory Visit (INDEPENDENT_AMBULATORY_CARE_PROVIDER_SITE_OTHER): Payer: Medicare Other | Admitting: Nurse Practitioner

## 2020-07-12 ENCOUNTER — Other Ambulatory Visit: Payer: Self-pay

## 2020-07-12 VITALS — BP 98/54 | HR 86 | Temp 96.8°F | Ht 67.0 in | Wt 136.0 lb

## 2020-07-12 DIAGNOSIS — S51811D Laceration without foreign body of right forearm, subsequent encounter: Secondary | ICD-10-CM

## 2020-07-12 DIAGNOSIS — R531 Weakness: Secondary | ICD-10-CM

## 2020-07-12 DIAGNOSIS — Z4802 Encounter for removal of sutures: Secondary | ICD-10-CM | POA: Diagnosis not present

## 2020-07-12 NOTE — Progress Notes (Signed)
Careteam: Patient Care Team: Frederica Kuster, MD as PCP - General (Family Medicine) Eugenia Mcalpine, MD as Consulting Physician (Orthopedic Surgery) Marvel Plan, MD as Consulting Physician (Neurology) Sallye Lat, MD as Consulting Physician (Ophthalmology) Luciana Axe Alford Highland, MD as Consulting Physician (Ophthalmology) Kermit Balo, DO (Geriatric Medicine)  PLACE OF SERVICE:  [facility]  Advanced Directive information    Allergies  Allergen Reactions  . Ace Inhibitors Cough  . Lisinopril Cough  . Statins Other (See Comments)    Body aches and pains    Chief Complaint  Patient presents with  . Follow-up    Follow-up from fall and remove staples      HPI: Patient is a 87 y.o. female to follow up from ED and stable removal The patient was attempting to get up from the toilet.  She was transferring to her wheelchair when she slipped and struck her head on the edge of a door frame, she was transferred to the ED and required 2 staples Also sustained a skin tear to the right forearm CT of head negative.  Pt with hx of dementia but overall has functioned well in the past. Now needing increase assistance due to frequent falls.  Reports head hurt the first night but not since No falls since 07/03/20  Review of Systems:  Review of Systems  Constitutional: Negative for chills, fever and weight loss.  HENT: Positive for hearing loss. Negative for tinnitus.   Respiratory: Negative for cough, sputum production and shortness of breath.   Cardiovascular: Negative for chest pain, palpitations and leg swelling.  Gastrointestinal: Negative for abdominal pain, constipation, diarrhea and heartburn.  Genitourinary: Negative for dysuria, frequency and urgency.  Musculoskeletal: Negative for back pain, falls, joint pain and myalgias.  Skin: Negative.   Neurological: Negative for dizziness and headaches.  Psychiatric/Behavioral: Negative for depression and memory loss. The patient  does not have insomnia.     Past Medical History:  Diagnosis Date  . Cervicalgia   . Chronic kidney disease   . Chronic kidney disease, stage III (moderate) (HCC)   . Dermatophytosis of groin and perianal area   . Dyspnea   . Dysrhythmia   . Hyperlipidemia LDL goal < 100   . Insomnia, unspecified   . Muscle weakness (generalized)   . Orthostatic hypotension   . Osteoarthrosis, unspecified whether generalized or localized, lower leg    osteoarthritis - upper thoracic area, left knee.  . Other abnormal glucose   . Other B-complex deficiencies   . Other malaise and fatigue   . Palpitations   . Senile cataract, unspecified   . Shortness of breath   . Stroke [facility])    small "stroke" - right sided weakness for a day.  . Stroke (HCC)   . Tachycardia, unspecified   . Undiagnosed cardiac murmurs   . Unspecified essential hypertension   . Unspecified vitamin D deficiency   . Urinary tract infection    Past Surgical History:  Procedure Laterality Date  . CATARACT EXTRACTION W/PHACO Bilateral 1994   Dr. Dione Booze  . CATARACT EXTRACTION, BILATERAL Bilateral   . EYE SURGERY     cataract  . RUPTURED GLOBE EXPLORATION AND REPAIR Left 09/03/2019   Procedure: Scleral laceration repair LEFT EYE, exam under anesthesia LEFT EYE;  Surgeon: Olivia Canter, MD;  Location: [place] OR;  Service: Ophthalmology;  Laterality: Left;  . TOE AMPUTATION  2002  . TONSILLECTOMY  1944  . TONSILLECTOMY     younger yrs  .  TOTAL KNEE ARTHROPLASTY Left 01/15/2015   Procedure: LEFT TOTAL KNEE ARTHROPLASTY;  Surgeon: Eugenia Mcalpine, MD;  Location: [facility] ORS;  Service: Orthopedics;  Laterality: Left;  . TUBAL LIGATION  1951   Social History:   reports that she has never smoked. She has never used smokeless tobacco. She reports previous alcohol use. She reports that she does not use drugs.  Family History  Problem Relation Age of Onset  . Cerebral aneurysm Mother   . Heart attack Father   . Alzheimer's disease  Sister   . Emphysema Sister   . Stroke Sister   . Alzheimer's disease Sister   . Arthritis Sister   . Stroke Sister        Age 85   . Lung cancer Son     Medications: Patient's Medications  New Prescriptions   No medications on file  Previous Medications   ACETAMINOPHEN 500 MG CAPSULE    Take 2 capsules (1,000 mg total) by mouth at bedtime.   B COMPLEX VITAMINS CAPSULE    Take 1 capsule by mouth daily.   CARVEDILOL (COREG) 6.25 MG TABLET    Take 6.25 mg by mouth 2 (two) times daily.   CHOLECALCIFEROL (VITAMIN D3) 10 MCG (400 UNIT) CAPS    Take 400 Units by mouth daily.   CIPROFLOXACIN (CIPRO) 500 MG TABLET    Take 500 mg by mouth 2 (two) times daily.   CONJUGATED ESTROGENS (PREMARIN) VAGINAL CREAM    Place 1 Applicatorful vaginally daily.   CYANOCOBALAMIN 1000 MCG CAPS    Take one capsule by mouth once daily.   DULOXETINE (CYMBALTA) 20 MG CAPSULE    Take 1 capsule (20 mg total) by mouth daily.   MOMETASONE (ELOCON) 0.1 % OINTMENT    Apply 1 application topically 2 (two) times daily. To vaginal area   MULTIPLE VITAMINS-MINERALS (PRESERVISION AREDS 2+MULTI VIT) CAPS    Take 1 capsule by mouth 2 (two) times daily.    ONDANSETRON (ZOFRAN) 4 MG TABLET    Take 4 mg by mouth every 6 (six) hours as needed.   POLYETHYLENE GLYCOL (MIRALAX / GLYCOLAX) 17 G PACKET    Take 17 g by mouth as needed.   PYRIDOXINE (B-6) 100 MG TABLET    Take 100 mg by mouth as needed. For burning upon urination.   TRAMADOL (ULTRAM) 50 MG TABLET    Take 1 tablet (50 mg total) by mouth 2 (two) times daily as needed for moderate pain (for pain).   TRAZODONE (DESYREL) 50 MG TABLET    Take 0.5 tablets (25 mg total) by mouth at bedtime.  Modified Medications   No medications on file  Discontinued Medications   CLOBETASOL CREAM (TEMOVATE) 0.05 %    Apply 1 application topically 2 (two) times daily.   SULFAMETHOXAZOLE-TRIMETHOPRIM (BACTRIM DS) 800-160 MG TABLET    Take 1 tablet by mouth 2 (two) times daily. For 7 days     Physical Exam:  Vitals:   07/12/20 1316  BP: (!) 98/54  Pulse: 86  Temp: (!) 96.8 F (36 C)  TempSrc: Temporal  SpO2: 96%  Weight: 136 lb (61.7 kg)  Height: 5\' 7"  (1.702 m)   Body mass index is 21.3 kg/m. Wt Readings from Last 3 Encounters:  07/12/20 136 lb (61.7 kg)  07/03/20 141 lb (64 kg)  06/23/20 129 lb 6.4 oz (58.7 kg)    Physical Exam Constitutional:      General: She is not in acute distress.    Appearance: She  is well-developed. She is not diaphoretic.  HENT:     Head: Normocephalic.     Comments: Laceration noted with 2 staples. No swelling, redness or tenderness noted.  Eyes:     Conjunctiva/sclera: Conjunctivae normal.     Pupils: Pupils are equal, round, and reactive to light.  Cardiovascular:     Rate and Rhythm: Normal rate and regular rhythm.     Heart sounds: Normal heart sounds.  Pulmonary:     Effort: Pulmonary effort is normal.     Breath sounds: Normal breath sounds.  Abdominal:     General: Bowel sounds are normal.     Palpations: Abdomen is soft.  Musculoskeletal:        General: No tenderness.     Cervical back: Normal range of motion and neck supple.  Skin:    General: Skin is warm and dry.  Neurological:     Mental Status: She is alert and oriented to person, place, and time.    Labs reviewed: Basic Metabolic Panel: Recent Labs    09/05/19 0242 11/25/19 1439 05/24/20 0943 06/23/20 0000  NA 134* 141 139 137  K 3.9 4.0 4.6 5.2  CL 103 105 111 106  CO2 24 27 22  19*  GLUCOSE 118* 129 141* 106  BUN 23 24 36* 45*  CREATININE 1.14* 1.32* 1.66* 1.27*  CALCIUM 8.1* 8.9 8.2* 8.9  MG 1.8  --   --   --    Liver Function Tests: Recent Labs    09/03/19 1400 09/05/19 0242 11/25/19 1439 05/24/20 0943 06/23/20 0000  AST 45* 17 14 17 18   ALT 202* 94* 11 14 15   ALKPHOS 162* 106  --  53  --   BILITOT 1.4* 0.9 0.9 0.7 0.4  PROT 5.9* 4.9* 5.6* 5.7* 6.6  ALBUMIN 3.4* 2.7*  --  2.9*  --    No results for input(s): LIPASE,  AMYLASE in the last 8760 hours. No results for input(s): AMMONIA in the last 8760 hours. CBC: Recent Labs    11/25/19 1439 05/24/20 0943 06/23/20 0000  WBC 6.3 8.8 8.6  NEUTROABS 4,624 7.2 6,545  HGB 11.2* 9.1* 11.2*  HCT 33.8* 29.4* 36.2  MCV 93.6 96.4 90.5  PLT 207 238 332   Lipid Panel: No results for input(s): CHOL, HDL, LDLCALC, TRIG, CHOLHDL, LDLDIRECT in the last 8760 hours. TSH: No results for input(s): TSH in the last 8760 hours. A1C: Lab Results  Component Value Date   HGBA1C 5.4 04/08/2020     Assessment/Plan 1. Generalized weakness Weakness with frequent falls. Has home health PT/OT that she is working with now. Has assistance with all ADLs.  2. Encounter for staple removal -area cleaned and 2 staples removed easily, pt tolerated well.  3. Skin tear of right forearm without complication, subsequent encounter -dermabond was applied in ED, area is healing well. Continues to use non-stick dressing to cover. Nursing is monitoring.   07/24/20. 08/23/20  [facility] [phone]

## 2020-07-13 ENCOUNTER — Ambulatory Visit (INDEPENDENT_AMBULATORY_CARE_PROVIDER_SITE_OTHER): Payer: Medicare Other | Admitting: Nurse Practitioner

## 2020-07-13 ENCOUNTER — Encounter: Payer: Self-pay | Admitting: Nurse Practitioner

## 2020-07-13 ENCOUNTER — Telehealth: Payer: Self-pay

## 2020-07-13 DIAGNOSIS — Z0000 Encounter for general adult medical examination without abnormal findings: Secondary | ICD-10-CM

## 2020-07-13 DIAGNOSIS — E2839 Other primary ovarian failure: Secondary | ICD-10-CM

## 2020-07-13 NOTE — Progress Notes (Signed)
This service is provided via telemedicine  No vital signs collected/recorded due to the encounter was a telemedicine visit.   Location of patient (ex: home, work):  Home  Patient consents to a telephone visit: Yes, see encounter dated 07/13/2020  Location of the provider (ex: office, home): [facility]  Name of any referring provider: Jacalyn Lefevre, MD  Names of all persons participating in the telemedicine service and their role in the encounter:  Abbey Chatters, Nurse Practitioner, Elveria Royals, CMA, and patient.   Time spent on call:  10 minutes with medical assistant

## 2020-07-13 NOTE — Patient Instructions (Signed)
Lisa [name] , Thank you for taking time to come for your Medicare Wellness Visit. I appreciate your ongoing commitment to your health goals. Please review the following plan we discussed and let me know if I can assist you in the future.   Screening recommendations/referrals: Colonoscopy aged out Mammogram aged out Bone Density order placed.  Recommended yearly ophthalmology/optometry visit for glaucoma screening and checkup Recommended yearly dental visit for hygiene and checkup  Vaccinations: Influenza vaccine up to date Pneumococcal vaccine up to date Tdap vaccine up to date Shingles vaccine RECOMMENDED- to get at your local pharmacy.     Advanced directives: on file.   Conditions/risks identified: high fall risk.   Next appointment: 1 year for awv    Preventive Care 13 Years and Older, Female Preventive care refers to lifestyle choices and visits with your health care provider that can promote health and wellness. What does preventive care include?  A yearly physical exam. This is also called an annual well check.  Dental exams once or twice a year.  Routine eye exams. Ask your health care provider how often you should have your eyes checked.  Personal lifestyle choices, including:  Daily care of your teeth and gums.  Regular physical activity.  Eating a healthy diet.  Avoiding tobacco and drug use.  Limiting alcohol use.  Practicing safe sex.  Taking low-dose aspirin every day.  Taking vitamin and mineral supplements as recommended by your health care provider. What happens during an annual well check? The services and screenings done by your health care provider during your annual well check will depend on your age, overall health, lifestyle risk factors, and family history of disease. Counseling  Your health care provider may ask you questions about your:  Alcohol use.  Tobacco use.  Drug use.  Emotional well-being.  Home and relationship  well-being.  Sexual activity.  Eating habits.  History of falls.  Memory and ability to understand (cognition).  Work and work Astronomer.  Reproductive health. Screening  You may have the following tests or measurements:  Height, weight, and BMI.  Blood pressure.  Lipid and cholesterol levels. These may be checked every 5 years, or more frequently if you are over 52 years old.  Skin check.  Lung cancer screening. You may have this screening every year starting at age 24 if you have a 30-pack-year history of smoking and currently smoke or have quit within the past 15 years.  Fecal occult blood test (FOBT) of the stool. You may have this test every year starting at age 20.  Flexible sigmoidoscopy or colonoscopy. You may have a sigmoidoscopy every 5 years or a colonoscopy every 10 years starting at age 86.  Hepatitis C blood test.  Hepatitis B blood test.  Sexually transmitted disease (STD) testing.  Diabetes screening. This is done by checking your blood sugar (glucose) after you have not eaten for a while (fasting). You may have this done every 1-3 years.  Bone density scan. This is done to screen for osteoporosis. You may have this done starting at age 81.  Mammogram. This may be done every 1-2 years. Talk to your health care provider about how often you should have regular mammograms. Talk with your health care provider about your test results, treatment options, and if necessary, the need for more tests. Vaccines  Your health care provider may recommend certain vaccines, such as:  Influenza vaccine. This is recommended every year.  Tetanus, diphtheria, and acellular pertussis (Tdap, Td) vaccine.  You may need a Td booster every 10 years.  Zoster vaccine. You may need this after age 93.  Pneumococcal 13-valent conjugate (PCV13) vaccine. One dose is recommended after age 70.  Pneumococcal polysaccharide (PPSV23) vaccine. One dose is recommended after age  32. Talk to your health care provider about which screenings and vaccines you need and how often you need them. This information is not intended to replace advice given to you by your health care provider. Make sure you discuss any questions you have with your health care provider. Document Released: 04/02/2015 Document Revised: 11/24/2015 Document Reviewed: 01/05/2015 Elsevier Interactive Patient Education  2017 [place] Prevention in the Home Falls can cause injuries. They can happen to people of all ages. There are many things you can do to make your home safe and to help prevent falls. What can I do on the outside of my home?  Regularly fix the edges of walkways and driveways and fix any cracks.  Remove anything that might make you trip as you walk through a door, such as a raised step or threshold.  Trim any bushes or trees on the path to your home.  Use bright outdoor lighting.  Clear any walking paths of anything that might make someone trip, such as rocks or tools.  Regularly check to see if handrails are loose or broken. Make sure that both sides of any steps have handrails.  Any raised decks and porches should have guardrails on the edges.  Have any leaves, snow, or ice cleared regularly.  Use sand or salt on walking paths during winter.  Clean up any spills in your garage right away. This includes oil or grease spills. What can I do in the bathroom?  Use night lights.  Install grab bars by the toilet and in the tub and shower. Do not use towel bars as grab bars.  Use non-skid mats or decals in the tub or shower.  If you need to sit down in the shower, use a plastic, non-slip stool.  Keep the floor dry. Clean up any water that spills on the floor as soon as it happens.  Remove soap buildup in the tub or shower regularly.  Attach bath mats securely with double-sided non-slip rug tape.  Do not have throw rugs and other things on the floor that can make  you trip. What can I do in the bedroom?  Use night lights.  Make sure that you have a light by your bed that is easy to reach.  Do not use any sheets or blankets that are too big for your bed. They should not hang down onto the floor.  Have a firm chair that has side arms. You can use this for support while you get dressed.  Do not have throw rugs and other things on the floor that can make you trip. What can I do in the kitchen?  Clean up any spills right away.  Avoid walking on wet floors.  Keep items that you use a lot in easy-to-reach places.  If you need to reach something above you, use a strong step stool that has a grab bar.  Keep electrical cords out of the way.  Do not use floor polish or wax that makes floors slippery. If you must use wax, use non-skid floor wax.  Do not have throw rugs and other things on the floor that can make you trip. What can I do with my stairs?  Do not leave any  items on the stairs.  Make sure that there are handrails on both sides of the stairs and use them. Fix handrails that are broken or loose. Make sure that handrails are as long as the stairways.  Check any carpeting to make sure that it is firmly attached to the stairs. Fix any carpet that is loose or worn.  Avoid having throw rugs at the top or bottom of the stairs. If you do have throw rugs, attach them to the floor with carpet tape.  Make sure that you have a light switch at the top of the stairs and the bottom of the stairs. If you do not have them, ask someone to add them for you. What else can I do to help prevent falls?  Wear shoes that:  Do not have high heels.  Have rubber bottoms.  Are comfortable and fit you well.  Are closed at the toe. Do not wear sandals.  If you use a stepladder:  Make sure that it is fully opened. Do not climb a closed stepladder.  Make sure that both sides of the stepladder are locked into place.  Ask someone to hold it for you, if  possible.  Clearly mark and make sure that you can see:  Any grab bars or handrails.  First and last steps.  Where the edge of each step is.  Use tools that help you move around (mobility aids) if they are needed. These include:  Canes.  Walkers.  Scooters.  Crutches.  Turn on the lights when you go into a dark area. Replace any light bulbs as soon as they burn out.  Set up your furniture so you have a clear path. Avoid moving your furniture around.  If any of your floors are uneven, fix them.  If there are any pets around you, be aware of where they are.  Review your medicines with your doctor. Some medicines can make you feel dizzy. This can increase your chance of falling. Ask your doctor what other things that you can do to help prevent falls. This information is not intended to replace advice given to you by your health care provider. Make sure you discuss any questions you have with your health care provider. Document Released: 12/31/2008 Document Revised: 08/12/2015 Document Reviewed: 04/10/2014 Elsevier Interactive Patient Education  2017 [employer].

## 2020-07-13 NOTE — Progress Notes (Signed)
Subjective:   Lisa [name] is a 87 y.o. female who presents for Medicare Annual (Subsequent) preventive examination.  Review of Systems     Cardiac Risk Factors include: advanced age (>5men, >38 women);hypertension;sedentary lifestyle     Objective:    There were no vitals filed for this visit. There is no height or weight on file to calculate BMI.  Advanced Directives 07/13/2020 07/03/2020 06/23/2020 04/08/2020 01/29/2020 11/25/2019 09/25/2019  Does Patient Have a Medical Advance Directive? Yes Yes Yes Yes Yes Yes Yes  Type of Estate agent of [place];Living will;Out of facility DNR (pink MOST or yellow form) Out of facility DNR (pink MOST or yellow form) Healthcare Power of [place];Living will;Out of facility DNR (pink MOST or yellow form) Healthcare Power of [place];Out of facility DNR (pink MOST or yellow form);Living will Healthcare Power of [place];Out of facility DNR (pink MOST or yellow form) Healthcare Power of [place];Living will;Out of facility DNR (pink MOST or yellow form) Healthcare Power of [place];Living will;Out of facility DNR (pink MOST or yellow form)  Does patient want to make changes to medical advance directive? No - Patient declined No - Patient declined No - Patient declined No - Patient declined No - Patient declined No - Patient declined No - Patient declined  Copy of Healthcare Power of Attorney in Chart? Yes - validated most recent copy scanned in chart (See row information) No - copy requested Yes - validated most recent copy scanned in chart (See row information) Yes - validated most recent copy scanned in chart (See row information) Yes - validated most recent copy scanned in chart (See row information) Yes - validated most recent copy scanned in chart (See row information) Yes - validated most recent copy scanned in chart (See row information)  Would patient like information on creating a medical advance directive? - - - - - - -   Pre-existing out of facility DNR order (yellow form or pink MOST form) Yellow form placed in chart (order not valid for inpatient use) - - Pink MOST/Yellow Form most recent copy in chart - Physician notified to receive inpatient order - - -    Current Medications (verified) Outpatient Encounter Medications as of 07/13/2020  Medication Sig  . Acetaminophen 500 MG capsule Take 2 capsules (1,000 mg total) by mouth at bedtime.  Marland Kitchen b complex vitamins capsule Take 1 capsule by mouth daily.  . carvedilol (COREG) 6.25 MG tablet Take 6.25 mg by mouth 2 (two) times daily.  . Cholecalciferol (VITAMIN D3) 10 MCG (400 UNIT) CAPS Take 400 Units by mouth daily.  Marland Kitchen conjugated estrogens (PREMARIN) vaginal cream Place 1 Applicatorful vaginally daily.  . Cyanocobalamin 1000 MCG CAPS Take one capsule by mouth once daily.  . Lidocaine HCl 200 MG/10ML SOSY Place 25 mLs vaginally every 12 (twelve) hours as needed (for atrophy).  . mometasone (ELOCON) 0.1 % ointment Apply 1 application topically 2 (two) times daily. To vaginal area  . Multiple Vitamins-Minerals (PRESERVISION AREDS 2+MULTI VIT) CAPS Take 1 capsule by mouth 2 (two) times daily.   . ondansetron (ZOFRAN) 4 MG tablet Take 4 mg by mouth every 6 (six) hours as needed.  . polyethylene glycol (MIRALAX / GLYCOLAX) 17 g packet Take 17 g by mouth as needed.  . pyridoxine (B-6) 100 MG tablet Take 100 mg by mouth as needed. For burning upon urination.  . traMADol (ULTRAM) 50 MG tablet Take 1 tablet (50 mg total) by mouth 2 (two) times daily as needed for moderate  pain (for pain).  . traZODone (DESYREL) 50 MG tablet Take 0.5 tablets (25 mg total) by mouth at bedtime.  . [DISCONTINUED] DULoxetine (CYMBALTA) 20 MG capsule Take 1 capsule (20 mg total) by mouth daily.   No facility-administered encounter medications on file as of 07/13/2020.    Allergies (verified) Ace inhibitors, Lisinopril, and Statins   History: Past Medical History:  Diagnosis Date  .  Cervicalgia   . Chronic kidney disease   . Chronic kidney disease, stage III (moderate) (HCC)   . Dermatophytosis of groin and perianal area   . Dyspnea   . Dysrhythmia   . Hyperlipidemia LDL goal < 100   . Insomnia, unspecified   . Muscle weakness (generalized)   . Orthostatic hypotension   . Osteoarthrosis, unspecified whether generalized or localized, lower leg    osteoarthritis - upper thoracic area, left knee.  . Other abnormal glucose   . Other B-complex deficiencies   . Other malaise and fatigue   . Palpitations   . Senile cataract, unspecified   . Shortness of breath   . Stroke [facility])    small "stroke" - right sided weakness for a day.  . Stroke (HCC)   . Tachycardia, unspecified   . Undiagnosed cardiac murmurs   . Unspecified essential hypertension   . Unspecified vitamin D deficiency   . Urinary tract infection    Past Surgical History:  Procedure Laterality Date  . CATARACT EXTRACTION W/PHACO Bilateral 1994   Dr. Dione Booze  . CATARACT EXTRACTION, BILATERAL Bilateral   . EYE SURGERY     cataract  . RUPTURED GLOBE EXPLORATION AND REPAIR Left 09/03/2019   Procedure: Scleral laceration repair LEFT EYE, exam under anesthesia LEFT EYE;  Surgeon: Olivia Canter, MD;  [facility];  Service: Ophthalmology;  Laterality: Left;  . TOE AMPUTATION  2002  . TONSILLECTOMY  1944  . TONSILLECTOMY     younger yrs  . TOTAL KNEE ARTHROPLASTY Left 01/15/2015   Procedure: LEFT TOTAL KNEE ARTHROPLASTY;  Surgeon: Eugenia Mcalpine, MD;  Location: [facility] ORS;  Service: Orthopedics;  Laterality: Left;  . TUBAL LIGATION  1951   Family History  Problem Relation Age of Onset  . Cerebral aneurysm Mother   . Heart attack Father   . Alzheimer's disease Sister   . Emphysema Sister   . Stroke Sister   . Alzheimer's disease Sister   . Arthritis Sister   . Stroke Sister        Age 68   . Lung cancer Son    Social History   Socioeconomic History  . Marital status: Widowed    Spouse  name: Not on file  . Number of children: 4  . Years of education: 12th  . Highest education level: Not on file  Occupational History  . Occupation: retired  Tobacco Use  . Smoking status: Never Smoker  . Smokeless tobacco: Never Used  Vaping Use  . Vaping Use: Never used  Substance and Sexual Activity  . Alcohol use: Not Currently    Comment: rare social every 3 or 4 months wine  . Drug use: No  . Sexual activity: Never  Other Topics Concern  . Not on file  Social History Narrative   ** Merged History Encounter **       Patient lives at home alone   Patient drinks coffee  And coke    Social Determinants of Health   Financial Resource Strain: Not on file  Food Insecurity: Not on file  Transportation Needs: Not on file  Physical Activity: Not on file  Stress: Not on file  Social Connections: Not on file    Tobacco Counseling Counseling given: Not Answered   Clinical Intake:  Pre-visit preparation completed: Yes  Pain : No/denies pain     BMI - recorded: 21 Nutritional Status: BMI of 19-24  Normal Nutritional Risks: None Diabetes: No  How often do you need to have someone help you when you read instructions, pamphlets, or other written materials from your doctor or pharmacy?: 1 - Never  Diabetic?no         Activities of Daily Living In your present state of health, do you have any difficulty performing the following activities: 07/13/2020  Hearing? Y  Vision? N  Difficulty concentrating or making decisions? N  Walking or climbing stairs? Y  Dressing or bathing? Y  Doing errands, shopping? Y  Comment unable to Electrical engineer and eating ? Y  Comment hands are numb, unable to prepare food  Using the Toilet? Y  Comment has to have assistance due to frequent falls.  In the past six months, have you accidently leaked urine? Y  Do you have problems with loss of bowel control? N  Managing your Medications? Y  Managing your Finances? Y   Housekeeping or managing your Housekeeping? Y  Some recent data might be hidden    Patient Care Team: Frederica Kuster, MD as PCP - General (Family Medicine) Eugenia Mcalpine, MD as Consulting Physician (Orthopedic Surgery) Marvel Plan, MD as Consulting Physician (Neurology) Sallye Lat, MD as Consulting Physician (Ophthalmology) Luciana Axe Alford Highland, MD as Consulting Physician (Ophthalmology) Kermit Balo, DO (Geriatric Medicine)  Indicate any recent Medical Services you may have received from other than [facility] providers in the past year (date may be approximate).     Assessment:   This is a routine wellness examination for Natilie.  Hearing/Vision screen  Hearing Screening   125Hz  250Hz  500Hz  1000Hz  2000Hz  3000Hz  4000Hz  6000Hz  8000Hz   Right ear:           Left ear:           Comments: Patient has hearing problems. Patient does not wear hearing aids  Vision Screening Comments: Patient has some vision problems. Patient's last eye exam was January 2022. Patient sees Dr.  Dietary issues and exercise activities discussed: Current Exercise Habits: Home exercise routine, Type of exercise: calisthenics, Time (Minutes): 30, Frequency (Times/Week): 2, Weekly Exercise (Minutes/Week): 60  Goals    . <enter goal here>     Starting 04/17/16, I will maintain my current lifestyle.     . Patient Stated     To get back to being independent       Depression Screen PHQ 2/9 Scores 07/13/2020 04/08/2020 07/08/2019 06/26/2019 02/24/2019 08/22/2018 07/05/2018  PHQ - 2 Score 0 0 0 0 0 0 0    Fall Risk Fall Risk  07/13/2020 07/12/2020 06/23/2020 04/08/2020 01/29/2020  Falls in the past year? 1 1 1  0 1  Comment - - - - September 20th, 2021  Number falls in past yr: 1 1 1  0 1  Injury with Fall? 1 1 1 1 1   Risk for fall due to : - - - - History of fall(s);Impaired balance/gait;Impaired mobility  Follow up - - - - Falls evaluation completed;Education provided;Falls prevention discussed    FALL  RISK PREVENTION PERTAINING TO THE HOME:  Any stairs in or around the home? No  If so, are  there any without handrails? No  Home free of loose throw rugs in walkways, pet beds, electrical cords, etc? Yes  Adequate lighting in your home to reduce risk of falls? Yes   ASSISTIVE DEVICES UTILIZED TO PREVENT FALLS:  Life alert? Yes  Use of a cane, walker or w/c? Yes  Grab bars in the bathroom? Yes  Shower chair or bench in shower? Yes  Elevated toilet seat or a handicapped toilet? Yes   TIMED UP AND GO:  Was the test performed? No .    Cognitive Function: MMSE - Mini Mental State Exam 07/02/2017 04/17/2016 04/12/2015  Not completed: - - (No Data)  Orientation to time 5 5 5   Orientation to Place 5 5 5   Registration 3 3 3   Attention/ Calculation 4 4 5   Recall 1 3 2   Language- name 2 objects 2 2 2   Language- repeat 1 1 1   Language- follow 3 step command 3 3 3   Language- read & follow direction 1 1 1   Write a sentence 1 1 1   Copy design 1 0 0  Total score 27 28 28      6CIT Screen 07/13/2020 07/08/2019 07/05/2018  What Year? 0 points 0 points 0 points  What month? 0 points 0 points 0 points  What time? 0 points 0 points 0 points  Count back from 20 0 points 0 points 0 points  Months in reverse 0 points 0 points 0 points  Repeat phrase 6 points 8 points 0 points  Total Score 6 8 0    Immunizations Immunization History  Administered Date(s) Administered  . Fluad Quad(high Dose 65+) 12/26/2018  . Influenza Split 11/18/2008, 12/28/2009  . Influenza Whole 12/24/2012  . Influenza, High Dose Seasonal PF 12/03/2017, 02/02/2020  . Influenza,inj,Quad PF,6+ Mos 12/21/2016  . Influenza-Unspecified 01/13/2014, 12/19/2014, 01/15/2016  . PFIZER(Purple Top)SARS-COV-2 Vaccination 05/22/2019, 06/18/2019, 12/30/2019  . PPD Test 01/18/2015  . Pneumococcal Conjugate-13 01/12/2014  . Pneumococcal Polysaccharide-23 05/22/2011  . Tdap 05/22/2011, 12/16/2019  . Zoster 12/20/2010    TDAP status:  Up to date  Flu Vaccine status: Up to date  Pneumococcal vaccine status: Up to date  Covid-19 vaccine status: Completed vaccines  Qualifies for Shingles Vaccine? Yes   Zostavax completed Yes   Shingrix Completed?: No.    Education has been provided regarding the importance of this vaccine. Patient has been advised to call insurance company to determine out of pocket expense if they have not yet received this vaccine. Advised may also receive vaccine at local pharmacy or Health Dept. Verbalized acceptance and understanding.  Screening Tests Health Maintenance  Topic Date Due  . INFLUENZA VACCINE  10/18/2020  . TETANUS/TDAP  12/15/2029  . COVID-19 Vaccine  Completed  . PNA vac Low Risk Adult  Completed  . DEXA SCAN  Addressed  . HPV VACCINES  Aged Out    Health Maintenance  There are no preventive care reminders to display for this patient.  Colorectal cancer screening: No longer required.   Mammogram status: No longer required due to aged out.  Bone Density status: Ordered today. Pt provided with contact info and advised to call to schedule appt.  Lung Cancer Screening: (Low Dose CT Chest recommended if Age 51-80 years, 30 pack-year currently smoking OR have quit w/in 15years.) does not qualify.   Lung Cancer Screening Referral: na  Additional Screening:  Hepatitis C Screening: does not qualify  Vision Screening: Recommended annual ophthalmology exams for early detection of glaucoma and other disorders of the  eye. Is the patient up to date with their annual eye exam?  Yes  Who is the provider or what is the name of the office in which the patient attends annual eye exams? Dr Luciana Axe If pt is not established with a provider, would they like to be referred to a provider to establish care? No .   Dental Screening: Recommended annual dental exams for proper oral hygiene  Community Resource Referral / Chronic Care Management: CRR required this visit?  No   CCM required  this visit?  Yes      Plan:     I have personally reviewed and noted the following in the patient's chart:   . Medical and social history . Use of alcohol, tobacco or illicit drugs  . Current medications and supplements . Functional ability and status . Nutritional status . Physical activity . Advanced directives . List of other physicians . Hospitalizations, surgeries, and ER visits in previous 12 months . Vitals . Screenings to include cognitive, depression, and falls . Referrals and appointments  In addition, I have reviewed and discussed with patient certain preventive protocols, quality metrics, and best practice recommendations. A written personalized care plan for preventive services as well as general preventive health recommendations were provided to patient.     Sharon Seller, NP   07/13/2020   Virtual Visit via Telephone Note  I connected with@ on 07/13/20 at 10:00 AM EDT by telephone and verified that I am speaking with the correct person using two identifiers.  Location: Patient: home Provider: [facility]   I discussed the limitations, risks, security and privacy concerns of performing an evaluation and management service by telephone and the availability of in person appointments. I also discussed with the patient that there may be a patient responsible charge related to this service. The patient expressed understanding and agreed to proceed.   I discussed the assessment and treatment plan with the patient. The patient was provided an opportunity to ask questions and all were answered. The patient agreed with the plan and demonstrated an understanding of the instructions.   The patient was advised to call back or seek an in-person evaluation if the symptoms worsen or if the condition fails to improve as anticipated.  I provided 18 minutes of non-face-to-face time during this encounter.  Janene Harvey. Biag[employer] printed and mailed

## 2020-07-13 NOTE — Telephone Encounter (Signed)
Ms. Sharmon Revere are scheduled for a virtual visit with your provider today.    Just as we do with appointments in the office, we must obtain your consent to participate.  Your consent will be active for this visit and any virtual visit you may have with one of our providers in the next 365 days.    If you have a MyChart account, I can also send a copy of this consent to you electronically.  All virtual visits are billed to your insurance company just like a traditional visit in the office.  As this is a virtual visit, video technology does not allow for your provider to perform a traditional examination.  This may limit your provider's ability to fully assess your condition.  If your provider identifies any concerns that need to be evaluated in person or the need to arrange testing such as labs, EKG, etc, we will make arrangements to do so.    Although advances in technology are sophisticated, we cannot ensure that it will always work on either your end or our end.  If the connection with a video visit is poor, we may have to switch to a telephone visit.  With either a video or telephone visit, we are not always able to ensure that we have a secure connection.   I need to obtain your verbal consent now.   Are you willing to proceed with your visit today?   Aniko Finnigan Falletta has provided verbal consent on 07/13/2020 for a virtual visit (video or telephone).   Elveria Royals, [facility] 07/13/2020  9:56 AM

## 2020-07-15 ENCOUNTER — Telehealth: Payer: Self-pay | Admitting: *Deleted

## 2020-07-15 MED ORDER — CARVEDILOL 6.25 MG PO TABS: ORAL_TABLET | ORAL | 0 refills | Status: AC

## 2020-07-15 NOTE — Telephone Encounter (Signed)
Dionka with [facility] called and stated that patient has been falling due to Blood Pressure dropping and fainting.   Home Health nurse is wondering if there could be changes made in patient's medication to help the blood pressure from falling.   Please Advise.  (forwarded to [facility] due to Dr. Hyacinth Meeker out of office)

## 2020-07-15 NOTE — Telephone Encounter (Signed)
Would recommend decreasing Carvedilol (COREG) 6.25 MG tablet to half tablet twice daily. Continue to monitor bp and HR.

## 2020-07-15 NOTE — Telephone Encounter (Signed)
Dionka Notified and agreed. Medication list updated.

## 2020-07-22 ENCOUNTER — Other Ambulatory Visit: Payer: Medicare Other | Admitting: Nurse Practitioner

## 2020-07-22 ENCOUNTER — Other Ambulatory Visit: Payer: Self-pay

## 2020-07-22 VITALS — BP 102/58 | HR 87 | Resp 18

## 2020-07-22 DIAGNOSIS — I959 Hypotension, unspecified: Secondary | ICD-10-CM

## 2020-07-22 DIAGNOSIS — R296 Repeated falls: Secondary | ICD-10-CM

## 2020-07-22 DIAGNOSIS — Z515 Encounter for palliative care: Secondary | ICD-10-CM

## 2020-07-22 NOTE — Telephone Encounter (Signed)
In agreement, she can wean medication and have them continue to monitor BP and HR

## 2020-07-22 NOTE — Progress Notes (Signed)
[facility] Consult Note Telephone: [phone]  Fax: [phone]    Date of encounter: 07/22/20 PATIENT NAME: Lisa [name] [address] [place] [id number]   [phone] (home)  DOB: 1921/05/24 MRN: [id number]  PRIMARY CARE PROVIDER:    Wardell Honour, MD,  [place] [address] [phone]  REFERRING PROVIDER:   Wardell [name], [address],  [place] [address] [phone]  RESPONSIBLE PARTY:    Contact Information    Name Relation Home Work Mobile   Lisa [name] Daughter [phone]  [phone]   Lisa [name]   [phone]   [name],Lisa Daughter   [phone]   [facility] Daughter [phone]  [phone]     I met face to face with patient in facility.  Palliative Care was asked to follow this patient by consultation request of  Lisa Honour, MD to address advance care planning and complex medical decision making. This is a follow up visit.                                   ASSESSMENT AND PLAN / RECOMMENDATIONS:   Advance Care Planning/Goals of Care: CODE STATUS: DNR Goal of care: Patient's goal of care is comfort while preserving function.  Directives: Signed DNR and MOST forms present on chart in the facility and on [place] EMR. Details of MOST form include comfort measures, determine use and limitation of antibiotics when infection occurs, no IV fluids, no feeding tubes.   Symptom Management/Plan: Frequent falls: Frequent fall related to orthostatic blood pressure. Blood pressure today 102/58, HR 87 while sitting. Patient unable to tolerate standing up for 1 minute, she report feeling lightheaded, she sat back down, BP evaluated was 100/60, HR 88. Facility report not receiving the order for Coreg 3.176m twice a day faxed on 07/15/2020.  [facility] spoke with ARodena PietyCMA for patient's PCP. Made  aware that facility report not receiving the order for the decreased dose of Coreg. Left a message for patient's PCP to consider tapering Coreg to stop if possible. Patient encouraged to monitor her blood pressure morning and night and record it. Patient has blood pressure monitoring device in her room and able to operate equipment. She is to call if her top number drops less than 90. Patient verbalized understanding. Patient encouraged to change position gradually and ask for help when going to the bathroom and with transfers. Continue Physical therapy. Urinary frequency: Patient followed by Dr. PClaudia Desanctiswith [facility]. She was started on antispasmodic Gemtesa 731mat bedtime, her first dose was last night. Patient report she has tried Mybetriq in the past, unable to tolerate because it caused elevation in her BP.  Follow up Palliative Care Visit: Palliative care will continue to follow for complex medical decision making, advance care planning, and clarification of goals. Return 2 weeks to reevaluate response to new dose of Coreg.  PPS: 40%  HOSPICE ELIGIBILITY/DIAGNOSIS: TBD  CHIEF COMPLAIN: Frequent falls  History obtained from review of EMR, discussion with primary team, and interview with family, facility staff/caregiver and/or Lisa [name].   HISTORY OF PRESENT ILLNESS:  Lisa [name] is a [age].o. year old female with complaints of frequent fall in the context of orthostatic blood pressure. Condition is ongoing and worsened in the last 3 months, associated with syncopal/near  syncopal episodes. Her blood pressure medication Coreg dose was decreased on 07/15/2020 from 6.86m twice a day to 3.1278mtwice a day. Patient report on going urinary frequency, currently on antibiotic for UTI, she was started on antispasmodics Gemtesa 7528maily at bedtime. Patient denied fever, denied chills, denied SOB, denied any uncontrolled pain.  Patient's other medical problems include Lichen Sclerosis (on  Estrogen cream and Clobetasol cream), depression, Insomnia, Osteoarthritis of neck, CKD 3, HTN, hx frequent UTI (followed by Urology Dr. PacClaudia Desanctisn Probiotics and daily Cranberry extract), hx of CVA (patient stopped taking Plavix due to bleeding issues).   I reviewed available labs, medications, imaging, studies and related documents from the EMR.  Records reviewed and summarized above.   Physical Exam: General: frail appearing, sitting in chair in her room in NAD EYES: anicteric sclera, no discharge  ENMT: hard of hearing, oral mucous membranes moist CV: RRR, no LE edema Pulmonary: LCTA, no increased work of breathing, no cough, room air Abdomen: no ascites GU: deferred MSK: sarcopenia, moves all extremities, ambulatory Skin: warm and dry, bruises on bilateral arms and legs from falls Neuro: generalized weakness,  no cognitive impairment Psych: non-anxious affect, A and O x 4 Hem/lymph/immuno: no widespread bruising  Past Medical History:  Diagnosis Date  . Cervicalgia   . Chronic kidney disease   . Chronic kidney disease, stage III (moderate) (HCC)   . Dermatophytosis of groin and perianal area   . Dyspnea   . Dysrhythmia   . Hyperlipidemia LDL goal < 100   . Insomnia, unspecified   . Muscle weakness (generalized)   . Orthostatic hypotension   . Osteoarthrosis, unspecified whether generalized or localized, lower leg    osteoarthritis - upper thoracic area, left knee.  . Other abnormal glucose   . Other B-complex deficiencies   . Other malaise and fatigue   . Palpitations   . Senile cataract, unspecified   . Shortness of breath   . [facility]  small "stroke" - right sided weakness for a day.  . St[facility] ([facility] . Tachycardia, unspecified   . Undiagnosed cardiac murmurs   . Unspecified essential hypertension   . Unspecified vitamin D deficiency   . Urinary tract infection     Thank you for the opportunity to participate in the care of Lisa [name].  The palliative care  team will continue to follow. Please call our office at [phone] we can be of additional assistance.   QueJari [name], AGPCNP-BC  COVID-19 PATIENT SCREENING TOOL Asked and negative response unless otherwise noted:   Have you had symptoms of covid, tested positive or been in contact with someone with symptoms/positive test in the past 5-10 days?

## 2020-07-22 NOTE — Telephone Encounter (Signed)
Called and notified Annita Brod, NP with Palliative Care.  She wants you to wean patient down off of the Coreg.  Stated that she would like for you to send the order to [facility].   Please Advise.

## 2020-07-22 NOTE — Telephone Encounter (Signed)
Annita Brod, NP with Palliative Care, called and stated that patient's blood Pressure is running low 102/58 Heart Rate 87.  She feels like the Coreg needs to be weaned down to Discontinue.  Please Advise.

## 2020-07-22 NOTE — Telephone Encounter (Signed)
Patient will need to come into office to be seen with record of vital signs Thank you

## 2020-07-22 NOTE — Telephone Encounter (Signed)
LMOM to return call.

## 2020-07-26 NOTE — Telephone Encounter (Signed)
Patient has an appointment this Wednesday 5/11 with Dr. Hyacinth Meeker. Daughter will discuss at that time.

## 2020-07-28 ENCOUNTER — Other Ambulatory Visit: Payer: Self-pay

## 2020-07-28 ENCOUNTER — Encounter: Payer: Self-pay | Admitting: Family Medicine

## 2020-07-28 ENCOUNTER — Ambulatory Visit (INDEPENDENT_AMBULATORY_CARE_PROVIDER_SITE_OTHER): Payer: Medicare Other | Admitting: Family Medicine

## 2020-07-28 VITALS — BP 110/80 | HR 82 | Temp 97.3°F | Resp 16 | Ht 67.0 in | Wt 138.0 lb

## 2020-07-28 DIAGNOSIS — I10 Essential (primary) hypertension: Secondary | ICD-10-CM

## 2020-07-28 DIAGNOSIS — R55 Syncope and collapse: Secondary | ICD-10-CM

## 2020-07-28 NOTE — Progress Notes (Signed)
Provider:  Jacalyn Lefevre, MD  Careteam: Patient Care Team: Frederica Kuster, MD as PCP - General (Family Medicine) Eugenia Mcalpine, MD as Consulting Physician (Orthopedic Surgery) Marvel Plan, MD as Consulting Physician (Neurology) Sallye Lat, MD as Consulting Physician (Ophthalmology) Luciana Axe Alford Highland, MD as Consulting Physician (Ophthalmology) Kermit Balo, DO (Geriatric Medicine)  PLACE OF SERVICE:  [facility]  Advanced Directive information    Allergies  Allergen Reactions  . Ace Inhibitors Cough  . Lisinopril Cough  . Statins Other (See Comments)    Body aches and pains    No chief complaint on file.    HPI: Patient is a 87 y.o. female she lives at [facility] assisted living.  Followed by palliative care.  Most recent visits have centered around postural hypotension and falls.  I believe we are in the process of her adjusting her carvedilol downward but there is seems to be some doubt about that.  Today my orders are to give 6.25 mg twice daily while monitoring her blood pressure and pulse.  She is also on beta-blocker to control tachycardia.  If blood pressure stays below 140/90 plan to decrease carvedilol to once a day at bedtime and follow Complains of some pain in her right upper chest secondary to fall today Also complains of some tingling in her hands.  Saw sports medicine doctor who gave her some wrist splints to wear at nighttime.  Does not seem like that has helped so suggested that we might do a 24 7.  I do not think she is a candidate for carpal tunnel surgery if indeed that is what she has.  Review of Systems:  Review of Systems  Musculoskeletal: Positive for falls.  Neurological: Positive for weakness.  All other systems reviewed and are negative.   Past Medical History:  Diagnosis Date  . Cervicalgia   . Chronic kidney disease   . Chronic kidney disease, stage III (moderate) (HCC)   . Dermatophytosis of groin and perianal area   .  Dyspnea   . Dysrhythmia   . Hyperlipidemia LDL goal < 100   . Insomnia, unspecified   . Muscle weakness (generalized)   . Orthostatic hypotension   . Osteoarthrosis, unspecified whether generalized or localized, lower leg    osteoarthritis - upper thoracic area, left knee.  . Other abnormal glucose   . Other B-complex deficiencies   . Other malaise and fatigue   . Palpitations   . Senile cataract, unspecified   . Shortness of breath   . Stroke [facility])    small "stroke" - right sided weakness for a day.  . Stroke (HCC)   . Tachycardia, unspecified   . Undiagnosed cardiac murmurs   . Unspecified essential hypertension   . Unspecified vitamin D deficiency   . Urinary tract infection    Past Surgical History:  Procedure Laterality Date  . CATARACT EXTRACTION W/PHACO Bilateral 1994   Dr. Dione Booze  . CATARACT EXTRACTION, BILATERAL Bilateral   . EYE SURGERY     cataract  . RUPTURED GLOBE EXPLORATION AND REPAIR Left 09/03/2019   Procedure: Scleral laceration repair LEFT EYE, exam under anesthesia LEFT EYE;  Surgeon: Olivia Canter, MD;  Location: [facility] OR;  Service: Ophthalmology;  Laterality: Left;  . TOE AMPUTATION  2002  . TONSILLECTOMY  1944  . TONSILLECTOMY     younger yrs  . TOTAL KNEE ARTHROPLASTY Left 01/15/2015   Procedure: LEFT TOTAL KNEE ARTHROPLASTY;  Surgeon: Eugenia Mcalpine, MD;  Location: [facility] ORS;  Service: Orthopedics;  Laterality: Left;  . TUBAL LIGATION  1951   Social History:   reports that she has never smoked. She has never used smokeless tobacco. She reports previous alcohol use. She reports that she does not use drugs.  Family History  Problem Relation Age of Onset  . Cerebral aneurysm Mother   . Heart attack Father   . Alzheimer's disease Sister   . Emphysema Sister   . Stroke Sister   . Alzheimer's disease Sister   . Arthritis Sister   . Stroke Sister        Age 66   . Lung cancer Son     Medications: Patient's Medications  New Prescriptions    No medications on file  Previous Medications   ACETAMINOPHEN 500 MG CAPSULE    Take 2 capsules (1,000 mg total) by mouth at bedtime.   B COMPLEX VITAMINS CAPSULE    Take 1 capsule by mouth daily.   CARVEDILOL (COREG) 6.25 MG TABLET    Take 1/2 tablet by mouth twice daily   CHOLECALCIFEROL (VITAMIN D3) 10 MCG (400 UNIT) CAPS    Take 400 Units by mouth daily.   CONJUGATED ESTROGENS (PREMARIN) VAGINAL CREAM    Place 1 Applicatorful vaginally daily.   CYANOCOBALAMIN 1000 MCG CAPS    Take one capsule by mouth once daily.   LIDOCAINE HCL 200 MG/10ML SOSY    Place 25 mLs vaginally every 12 (twelve) hours as needed (for atrophy).   MOMETASONE (ELOCON) 0.1 % OINTMENT    Apply 1 application topically 2 (two) times daily. To vaginal area   MULTIPLE VITAMINS-MINERALS (PRESERVISION AREDS 2+MULTI VIT) CAPS    Take 1 capsule by mouth 2 (two) times daily.    ONDANSETRON (ZOFRAN) 4 MG TABLET    Take 4 mg by mouth every 6 (six) hours as needed.   POLYETHYLENE GLYCOL (MIRALAX / GLYCOLAX) 17 G PACKET    Take 17 g by mouth as needed.   PYRIDOXINE (B-6) 100 MG TABLET    Take 100 mg by mouth as needed. For burning upon urination.   TRAMADOL (ULTRAM) 50 MG TABLET    Take 1 tablet (50 mg total) by mouth 2 (two) times daily as needed for moderate pain (for pain).   TRAZODONE (DESYREL) 50 MG TABLET    Take 0.5 tablets (25 mg total) by mouth at bedtime.  Modified Medications   No medications on file  Discontinued Medications   No medications on file    Physical Exam:  There were no vitals filed for this visit. There is no height or weight on file to calculate BMI. Wt Readings from Last 3 Encounters:  07/12/20 136 lb (61.7 kg)  07/03/20 141 lb (64 kg)  06/23/20 129 lb 6.4 oz (58.7 kg)    Physical Exam Vitals and nursing note reviewed. Exam conducted with a chaperone present.  Constitutional:      Appearance: Normal appearance.  HENT:     Head: Normocephalic.  Cardiovascular:     Rate and Rhythm: Normal  rate and regular rhythm.  Pulmonary:     Effort: Pulmonary effort is normal.     Breath sounds: Normal breath sounds.     Comments: Some tenderness right lateral chest wall consistent with cartilaginous portion of ribs Musculoskeletal:     Cervical back: Normal range of motion.  Neurological:     Mental Status: She is alert.     Labs reviewed: Basic Metabolic Panel: Recent Labs    09/05/19 0242 11/25/19  1439 05/24/20 0943 06/23/20 0000  NA 134* 141 139 137  K 3.9 4.0 4.6 5.2  CL 103 105 111 106  CO2 24 27 22  19*  GLUCOSE 118* 129 141* 106  BUN 23 24 36* 45*  CREATININE 1.14* 1.32* 1.66* 1.27*  CALCIUM 8.1* 8.9 8.2* 8.9  MG 1.8  --   --   --    Liver Function Tests: Recent Labs    09/03/19 1400 09/05/19 0242 11/25/19 1439 05/24/20 0943 06/23/20 0000  AST 45* 17 14 17 18   ALT 202* 94* 11 14 15   ALKPHOS 162* 106  --  53  --   BILITOT 1.4* 0.9 0.9 0.7 0.4  PROT 5.9* 4.9* 5.6* 5.7* 6.6  ALBUMIN 3.4* 2.7*  --  2.9*  --    No results for input(s): LIPASE, AMYLASE in the last 8760 hours. No results for input(s): AMMONIA in the last 8760 hours. CBC: Recent Labs    11/25/19 1439 05/24/20 0943 06/23/20 0000  WBC 6.3 8.8 8.6  NEUTROABS 4,624 7.2 6,545  HGB 11.2* 9.1* 11.2*  HCT 33.8* 29.4* 36.2  MCV 93.6 96.4 90.5  PLT 207 238 332   Lipid Panel: No results for input(s): CHOL, HDL, LDLCALC, TRIG, CHOLHDL, LDLDIRECT in the last 8760 hours. TSH: No results for input(s): TSH in the last 8760 hours. A1C: Lab Results  Component Value Date   HGBA1C 5.4 04/08/2020     Assessment/Plan  1. Essential hypertension We will try to leave off carvedilol and begin taper while monitoring pressure and pulse.  I am not sure this will help her falls.  There does seem to be an exaggerated postural drop because she usually falls when she stands.  This could be related to her age and lack of vascular tone as much as medication  2. Syncope and collapse See discussion above.   Consider use of medicine to prevent postural hypotension.  I think basically she is inactive and sits in a chair does not really attempt to walk anymore and is subject to falls due to weakness and lack of general time.  Not really sure we can help this problem  07/24/20, MD [facility] [phone]

## 2020-07-28 NOTE — Patient Instructions (Signed)
Take Coreg 6.25 mg BID and monitor BP and piulse

## 2020-08-03 ENCOUNTER — Telehealth: Payer: Self-pay

## 2020-08-03 NOTE — Telephone Encounter (Signed)
Okay  to extend PT order

## 2020-08-03 NOTE — Telephone Encounter (Addendum)
Nedra Hai the physical therapist with [facility] requesting verbal orders to extend the patient's home health PT for 1 time per week for 2 more weeks, effective today 5/17. Nedra Hai can be reached at [phone]. To Dr. Hyacinth Meeker

## 2020-08-03 NOTE — Telephone Encounter (Signed)
Nedra Hai Notified and agreed.

## 2020-08-05 ENCOUNTER — Non-Acute Institutional Stay: Payer: Medicare Other | Admitting: Nurse Practitioner

## 2020-08-05 ENCOUNTER — Other Ambulatory Visit: Payer: Self-pay

## 2020-08-05 VITALS — BP 178/90 | HR 92 | Resp 18

## 2020-08-05 DIAGNOSIS — I951 Orthostatic hypotension: Secondary | ICD-10-CM

## 2020-08-05 DIAGNOSIS — Z515 Encounter for palliative care: Secondary | ICD-10-CM

## 2020-08-05 NOTE — Progress Notes (Signed)
[facility] Consult Note Telephone: [phone]  Fax: [phone]    Date of encounter: 08/05/20 PATIENT NAME: Lisa [name] Rm Lisa [name] [id number]   [phone] (home)  DOB: 1921-12-06 MRN: [id number]  PRIMARY CARE PROVIDER:    Wardell Honour, MD,  [place] [address] [phone]  REFERRING PROVIDER:   Wardell [name], Lisa [name],  Lisa [name] [address] [phone]  RESPONSIBLE PARTY:    Contact Information    Name Relation Home Work Mobile   Lisa [name] Daughter [phone]  [phone]   Lisa [name]   [phone]   [name],Lisa Daughter   [phone]   [facility] Daughter [phone]  [phone]     I met face to face with patient in facility. Palliative Care was asked to follow this patient by consultation request of  Lisa Honour, MD to address advance care planning and complex medical decision making. This is a follow up visit.                                  ASSESSMENT AND PLAN / RECOMMENDATIONS:   Advance Care Planning/Goals of Care:  CODE STATUS: DNR Goal of care: Patient's goal of care is comfort while preserving function.  Directives: Signed DNR and MOST forms present on chart in the facility and on [facility] EMR. Details of MOST form include comfort measures, determine use and limitation of antibiotics when infection occurs, no IV fluids, no feeding tubes.   Symptom Management/Plan: Orthostatic hypotension: Improved in the last two weeks. BP today 178/90. Continue current plan of care with Coreg 6.29m twice a day and daily monitoring of blood pressures. Patient report she was called to schedule a bone density test. Patient verbalized that she is 87years old and does not want to complete a bone density test. Patient made aware that it is a recommendation and that she has a right to refuse it if she does not want to have the  testcompleted. Provided general support and encouragement. Visit update discussed with patient's daughter via telephone. Questions and concerns were addressed. Patient and her daughter was encouraged to call with questions and/or concerns.  Follow up Palliative Care Visit: Palliative care will continue to follow for complex medical decision making, advance care planning, and clarification of goals. Return in about 4-6 weeks or prn.  PPS: 40%  HOSPICE ELIGIBILITY/DIAGNOSIS: TBD  CHIEF COMPLAIN: Follow up on orthostatic hypotension  History obtained from review of Epic EMR, discussion with facility staff, and interview with Lisa [name] and her daughter KJuliann [name] HISTORY OF PRESENT ILLNESS:Lisa [name] a 87y.o.year old femalewith medical problems including Lichen Sclerosis (on Estrogen cream and Clobetasol cream), depression, Insomnia, Osteoarthritis of neck, CKD 3, HTN, hx frequent UTI(followed by Urology Lisa [name] on Probiotics and daily Cranberry extract), hx of CVA (patient stopped taking Plavix due to bleeding issues).  Patient report one fall in the last 2 two weeks. She continues on Coreg 6.220mtwice day per order from her new PCP [facility] Review of blood pressure log in the last two weeks showed SBP range of 129-187, with an average of 148. Patient denied any episodes of fall, dizziness or lightheadedness in the last 2 weeks. Denied chest pain, denied SOB, denied any uncontrolled pain.  I reviewed available labs, medications, imaging, studies and related documents  from the EMR.  Records reviewed and summarized above.   Physical Exam: Current and past weights: Constitutional: NAD General: frail appearing, thin/WNWD/obese  EYES: anicteric sclera, lids intact, no discharge  ENMT: intact hearing, oral mucous membranes moist, dentition intact CV: S1S2, RRR, no LE edema Pulmonary: LCTA, no increased work of breathing, no cough, room air Abdomen:  intake 100%, normo-active BS + 4 quadrants, soft and non tender, no ascites GU: deferred MSK: no sarcopenia, moves all extremities, ambulatory Skin: warm and dry, no rashes or wounds on visible skin Neuro:  no generalized weakness,  no cognitive impairment Psych: non-anxious affect, A and O x 3 Hem/lymph/immuno: no widespread bruising  Past Medical History:  Diagnosis Date  . Cervicalgia   . Chronic kidney disease   . Chronic kidney disease, stage III (moderate) (HCC)   . Dermatophytosis of groin and perianal area   . Dyspnea   . Dysrhythmia   . Hyperlipidemia LDL goal < 100   . Insomnia, unspecified   . Muscle weakness (generalized)   . Orthostatic hypotension   . Osteoarthrosis, unspecified whether generalized or localized, lower leg    osteoarthritis - upper thoracic area, left knee.  . Other abnormal glucose   . Other B-complex deficiencies   . Other malaise and fatigue   . Palpitations   . Senile cataract, unspecified   . Shortness of breath   . Stroke [facility])    small "stroke" - right sided weakness for a day.  . Stroke ([name])   . Tachycardia, unspecified   . Undiagnosed cardiac murmurs   . Unspecified essential hypertension   . Unspecified vitamin D deficiency   . Urinary tract infection    Current Outpatient Medications on File Prior to Visit  Medication Sig Dispense Refill  . Acetaminophen 500 MG capsule Take 2 capsules (1,000 mg total) by mouth at bedtime. 60 capsule 11  . b complex vitamins capsule Take 1 capsule by mouth daily.    . carvedilol (COREG) 6.25 MG tablet Take 1/2 tablet by mouth twice daily 30 tablet 0  . Cholecalciferol (VITAMIN D3) 10 MCG (400 UNIT) CAPS Take 400 Units by mouth daily.    . Cyanocobalamin 1000 MCG CAPS Take one capsule by mouth once daily.    . Lidocaine HCl 200 MG/10ML SOSY Place 25 mLs vaginally every 12 (twelve) hours as needed (for atrophy). 10 mL 11  . mometasone (ELOCON) 0.1 % ointment Apply 1 application topically 2 (two)  times daily. To vaginal area    . Multiple Vitamins-Minerals (PRESERVISION AREDS 2+MULTI VIT) CAPS Take 1 capsule by mouth 2 (two) times daily.     . ondansetron (ZOFRAN) 4 MG tablet Take 4 mg by mouth every 6 (six) hours as needed.    . polyethylene glycol (MIRALAX / GLYCOLAX) 17 g packet Take 17 g by mouth as needed.    . pyridoxine (B-6) 100 MG tablet Take 100 mg by mouth as needed. For burning upon urination.    . traMADol (ULTRAM) 50 MG tablet Take 1 tablet (50 mg total) by mouth 2 (two) times daily as needed for moderate pain (for pain). 60 tablet 1  . traZODone (DESYREL) 50 MG tablet Take 0.5 tablets (25 mg total) by mouth at bedtime. 15 tablet 11   No current facility-administered medications on file prior to visit.   Thank you for the opportunity to participate in the care of Ms. Buccieri.  The palliative care team will continue to follow. Please call our office at [phone] if  we can be of additional assistance.   Jari Favre, DNP,AGPCNP-BC  COVID-19 PATIENT SCREENING TOOL Asked and negative response unless otherwise noted:   Have you had symptoms of covid, tested positive or been in contact with someone with symptoms/positive test in the past 5-10 days?

## 2020-08-10 ENCOUNTER — Emergency Department (HOSPITAL_COMMUNITY): Payer: Medicare Other

## 2020-08-10 ENCOUNTER — Encounter (HOSPITAL_COMMUNITY): Payer: Self-pay | Admitting: Emergency Medicine

## 2020-08-10 ENCOUNTER — Other Ambulatory Visit: Payer: Self-pay

## 2020-08-10 ENCOUNTER — Inpatient Hospital Stay (HOSPITAL_COMMUNITY)
Admission: EM | Admit: 2020-08-10 | Discharge: 2020-08-12 | DRG: 185 | Disposition: A | Discharge status: Skilled Nursing Facility | Payer: Medicare Other | Source: Skilled Nursing Facility | Attending: Family Medicine | Admitting: Family Medicine

## 2020-08-10 DIAGNOSIS — Z825 Family history of asthma and other chronic lower respiratory diseases: Secondary | ICD-10-CM

## 2020-08-10 DIAGNOSIS — M5030 Other cervical disc degeneration, unspecified cervical region: Secondary | ICD-10-CM

## 2020-08-10 DIAGNOSIS — M25512 Pain in left shoulder: Secondary | ICD-10-CM | POA: Diagnosis present

## 2020-08-10 DIAGNOSIS — S80812A Abrasion, left lower leg, initial encounter: Secondary | ICD-10-CM | POA: Diagnosis present

## 2020-08-10 DIAGNOSIS — Z8673 Personal history of transient ischemic attack (TIA), and cerebral infarction without residual deficits: Secondary | ICD-10-CM

## 2020-08-10 DIAGNOSIS — Z8249 Family history of ischemic heart disease and other diseases of the circulatory system: Secondary | ICD-10-CM

## 2020-08-10 DIAGNOSIS — Y92099 Unspecified place in other non-institutional residence as the place of occurrence of the external cause: Secondary | ICD-10-CM

## 2020-08-10 DIAGNOSIS — E539 Vitamin B deficiency, unspecified: Secondary | ICD-10-CM | POA: Diagnosis present

## 2020-08-10 DIAGNOSIS — S2241XA Multiple fractures of ribs, right side, initial encounter for closed fracture: Secondary | ICD-10-CM | POA: Diagnosis not present

## 2020-08-10 DIAGNOSIS — I129 Hypertensive chronic kidney disease with stage 1 through stage 4 chronic kidney disease, or unspecified chronic kidney disease: Secondary | ICD-10-CM | POA: Diagnosis present

## 2020-08-10 DIAGNOSIS — S80811A Abrasion, right lower leg, initial encounter: Secondary | ICD-10-CM | POA: Diagnosis present

## 2020-08-10 DIAGNOSIS — R200 Anesthesia of skin: Secondary | ICD-10-CM | POA: Diagnosis present

## 2020-08-10 DIAGNOSIS — Y92009 Unspecified place in unspecified non-institutional (private) residence as the place of occurrence of the external cause: Secondary | ICD-10-CM

## 2020-08-10 DIAGNOSIS — N1832 Chronic kidney disease, stage 3b: Secondary | ICD-10-CM | POA: Diagnosis not present

## 2020-08-10 DIAGNOSIS — W19XXXA Unspecified fall, initial encounter: Secondary | ICD-10-CM

## 2020-08-10 DIAGNOSIS — I700 Atherosclerosis of aorta: Secondary | ICD-10-CM

## 2020-08-10 DIAGNOSIS — Z993 Dependence on wheelchair: Secondary | ICD-10-CM

## 2020-08-10 DIAGNOSIS — M542 Cervicalgia: Secondary | ICD-10-CM | POA: Diagnosis present

## 2020-08-10 DIAGNOSIS — T148XXA Other injury of unspecified body region, initial encounter: Secondary | ICD-10-CM

## 2020-08-10 DIAGNOSIS — Z96652 Presence of left artificial knee joint: Secondary | ICD-10-CM | POA: Diagnosis present

## 2020-08-10 DIAGNOSIS — Z87440 Personal history of urinary (tract) infections: Secondary | ICD-10-CM

## 2020-08-10 DIAGNOSIS — Z20822 Contact with and (suspected) exposure to covid-19: Secondary | ICD-10-CM | POA: Diagnosis present

## 2020-08-10 DIAGNOSIS — Z820 Family history of epilepsy and other diseases of the nervous system: Secondary | ICD-10-CM

## 2020-08-10 DIAGNOSIS — W06XXXA Fall from bed, initial encounter: Secondary | ICD-10-CM | POA: Diagnosis present

## 2020-08-10 DIAGNOSIS — R296 Repeated falls: Secondary | ICD-10-CM | POA: Diagnosis present

## 2020-08-10 DIAGNOSIS — N183 Chronic kidney disease, stage 3 unspecified: Secondary | ICD-10-CM | POA: Diagnosis present

## 2020-08-10 DIAGNOSIS — M79602 Pain in left arm: Secondary | ICD-10-CM | POA: Diagnosis present

## 2020-08-10 DIAGNOSIS — S0181XA Laceration without foreign body of other part of head, initial encounter: Secondary | ICD-10-CM | POA: Diagnosis present

## 2020-08-10 DIAGNOSIS — Z8261 Family history of arthritis: Secondary | ICD-10-CM

## 2020-08-10 DIAGNOSIS — S0990XA Unspecified injury of head, initial encounter: Secondary | ICD-10-CM

## 2020-08-10 DIAGNOSIS — Z66 Do not resuscitate: Secondary | ICD-10-CM | POA: Diagnosis present

## 2020-08-10 DIAGNOSIS — S2249XA Multiple fractures of ribs, unspecified side, initial encounter for closed fracture: Secondary | ICD-10-CM | POA: Diagnosis not present

## 2020-08-10 DIAGNOSIS — E559 Vitamin D deficiency, unspecified: Secondary | ICD-10-CM | POA: Diagnosis present

## 2020-08-10 DIAGNOSIS — Z823 Family history of stroke: Secondary | ICD-10-CM

## 2020-08-10 DIAGNOSIS — Z89429 Acquired absence of other toe(s), unspecified side: Secondary | ICD-10-CM

## 2020-08-10 DIAGNOSIS — G8929 Other chronic pain: Secondary | ICD-10-CM | POA: Diagnosis present

## 2020-08-10 DIAGNOSIS — Z801 Family history of malignant neoplasm of trachea, bronchus and lung: Secondary | ICD-10-CM

## 2020-08-10 DIAGNOSIS — W1830XA Fall on same level, unspecified, initial encounter: Secondary | ICD-10-CM | POA: Diagnosis present

## 2020-08-10 DIAGNOSIS — R918 Other nonspecific abnormal finding of lung field: Secondary | ICD-10-CM | POA: Diagnosis present

## 2020-08-10 LAB — BASIC METABOLIC PANEL
Anion gap: 8 (ref 5–15)
BUN: 19 mg/dL (ref 8–23)
CO2: 28 mmol/L (ref 22–32)
Calcium: 8.6 mg/dL — ABNORMAL LOW (ref 8.9–10.3)
Chloride: 106 mmol/L (ref 98–111)
Creatinine, Ser: 0.94 mg/dL (ref 0.44–1.00)
GFR, Estimated: 56 mL/min — ABNORMAL LOW (ref >60)
Glucose, Bld: 130 mg/dL — ABNORMAL HIGH (ref 70–99)
Potassium: 3.8 mmol/L (ref 3.5–5.1)
Sodium: 142 mmol/L (ref 135–145)

## 2020-08-10 LAB — CBC WITH DIFFERENTIAL/PLATELET
Abs Immature Granulocytes: 0.06 10*3/uL (ref 0.00–0.07)
Basophils Absolute: 0 10*3/uL (ref 0.0–0.1)
Basophils Relative: 0 %
Eosinophils Absolute: 0.1 10*3/uL (ref 0.0–0.5)
Eosinophils Relative: 1 %
HCT: 37.7 % (ref 36.0–46.0)
Hemoglobin: 11.7 g/dL — ABNORMAL LOW (ref 12.0–15.0)
Immature Granulocytes: 1 %
Lymphocytes Relative: 13 %
Lymphs Abs: 1.2 10*3/uL (ref 0.7–4.0)
MCH: 28.6 pg (ref 26.0–34.0)
MCHC: 31 g/dL (ref 30.0–36.0)
MCV: 92.2 fL (ref 80.0–100.0)
Monocytes Absolute: 0.3 10*3/uL (ref 0.1–1.0)
Monocytes Relative: 4 %
Neutro Abs: 7.7 10*3/uL (ref 1.7–7.7)
Neutrophils Relative %: 81 %
Platelets: 331 10*3/uL (ref 150–400)
RBC: 4.09 MIL/uL (ref 3.87–5.11)
RDW: 14.8 % (ref 11.5–15.5)
WBC: 9.4 10*3/uL (ref 4.0–10.5)
nRBC: 0 % (ref 0.0–0.2)

## 2020-08-10 LAB — RESP PANEL BY RT-PCR (FLU A&B, COVID) ARPGX2
Influenza A by PCR: NEGATIVE
Influenza B by PCR: NEGATIVE
SARS Coronavirus 2 by RT PCR: NEGATIVE

## 2020-08-10 MED ORDER — ENOXAPARIN SODIUM 40 MG/0.4ML IJ SOSY
40.0000 mg | PREFILLED_SYRINGE | INTRAMUSCULAR | Status: DC
  Filled 2020-08-10: qty 0.4

## 2020-08-10 MED ORDER — CHOLECALCIFEROL 10 MCG (400 UNIT) PO TABS
400.0000 [IU] | ORAL_TABLET | Freq: Every day | ORAL | Status: DC
  Administered 2020-08-11 – 2020-08-12 (×2): 400 [IU] via ORAL
  Filled 2020-08-10 (×2): qty 1

## 2020-08-10 MED ORDER — SENNOSIDES-DOCUSATE SODIUM 8.6-50 MG PO TABS: 1.0000 | ORAL_TABLET | Freq: Every evening | ORAL | Status: DC | PRN

## 2020-08-10 MED ORDER — PROSIGHT PO TABS
ORAL_TABLET | Freq: Two times a day (BID) | ORAL | Status: DC
  Administered 2020-08-11 – 2020-08-12 (×3): 1 via ORAL
  Filled 2020-08-10 (×3): qty 1

## 2020-08-10 MED ORDER — ONDANSETRON HCL 4 MG/2ML IJ SOLN: 4.0000 mg | Freq: Four times a day (QID) | INTRAMUSCULAR | Status: DC | PRN

## 2020-08-10 MED ORDER — MORPHINE SULFATE (PF) 2 MG/ML IV SOLN
2.0000 mg | Freq: Once | INTRAVENOUS | Status: DC
  Filled 2020-08-10: qty 1

## 2020-08-10 MED ORDER — VITAMIN B-12 1000 MCG PO TABS
1000.0000 ug | ORAL_TABLET | Freq: Every day | ORAL | Status: DC
  Administered 2020-08-11 – 2020-08-12 (×2): 1000 ug via ORAL
  Filled 2020-08-10 (×2): qty 1

## 2020-08-10 MED ORDER — POLYETHYLENE GLYCOL 3350 17 G PO PACK: 17.0000 g | PACK | Freq: Every day | ORAL | Status: DC | PRN

## 2020-08-10 MED ORDER — TRAZODONE HCL 50 MG PO TABS
25.0000 mg | ORAL_TABLET | Freq: Every day | ORAL | Status: DC
  Administered 2020-08-10 – 2020-08-11 (×2): 25 mg via ORAL
  Filled 2020-08-10 (×2): qty 1

## 2020-08-10 MED ORDER — HYDRALAZINE HCL 25 MG PO TABS: 25.0000 mg | ORAL_TABLET | Freq: Four times a day (QID) | ORAL | Status: DC | PRN

## 2020-08-10 MED ORDER — CARVEDILOL 3.125 MG PO TABS
6.2500 mg | ORAL_TABLET | Freq: Once | ORAL | Status: AC
  Administered 2020-08-10: 6.25 mg via ORAL
  Filled 2020-08-10: qty 2

## 2020-08-10 MED ORDER — ONDANSETRON HCL 4 MG PO TABS: 4.0000 mg | ORAL_TABLET | Freq: Four times a day (QID) | ORAL | Status: DC | PRN

## 2020-08-10 MED ORDER — ENSURE ENLIVE PO LIQD
237.0000 mL | Freq: Two times a day (BID) | ORAL | Status: DC
  Administered 2020-08-11 – 2020-08-12 (×3): 237 mL via ORAL

## 2020-08-10 MED ORDER — ACETAMINOPHEN 325 MG PO TABS: 650.0000 mg | ORAL_TABLET | Freq: Four times a day (QID) | ORAL | Status: DC | PRN

## 2020-08-10 MED ORDER — CARVEDILOL 6.25 MG PO TABS
6.2500 mg | ORAL_TABLET | Freq: Two times a day (BID) | ORAL | Status: DC
  Administered 2020-08-10 – 2020-08-12 (×4): 6.25 mg via ORAL
  Filled 2020-08-10 (×4): qty 1

## 2020-08-10 MED ORDER — TRAMADOL HCL 50 MG PO TABS
50.0000 mg | ORAL_TABLET | Freq: Two times a day (BID) | ORAL | Status: DC | PRN
  Administered 2020-08-10: 50 mg via ORAL
  Filled 2020-08-10 (×2): qty 1

## 2020-08-10 MED ORDER — ACETAMINOPHEN 650 MG RE SUPP: 650.0000 mg | Freq: Four times a day (QID) | RECTAL | Status: DC | PRN

## 2020-08-10 NOTE — ED Notes (Signed)
Purewick removed from patient

## 2020-08-10 NOTE — ED Notes (Signed)
Patient attached to external female catheter 

## 2020-08-10 NOTE — Consult Note (Addendum)
[facility] Consult [facility]  Lisa [name] 01/07/1922  [id number].    Requesting MD: Derwood KaplanAnkit Nanavati Chief Complaint/Reason for Consult: fall  HPI:  Lisa [name] is a 87yo female PMH HTN, HLD, CKD, recurrent UTIs, OAB, hx remote CVA, chronic neck pain on tramadol who [facility] after suffering a ground level fall. States that she was transferring from bed to wheelchair when she fell and struck the ground with her head and right side. Denies LOC and she remembers the entire incident. Complaining of pain to her left forehead. Otherwise denies acute neck pain, blurry vision, chest pain, SOB, abdominal pain. She suffered abrasions to her lower extremities but denies pain with BUE/BLE movement.  Patient was worked up by [employer] and found to have Multiple Right rib fractures 8-11. General surgery asked to see. Of note, patient's daughter did say that she suffered a ground level fall about 2 weeks ago and complained of pain in her right ribs after that. She did not have any imaging at that time.   Lives in assisted living facility Uses wheelchair for mobilization  Review of Systems  Constitutional: Negative.   Respiratory: Negative.   Cardiovascular: Negative.   Gastrointestinal: Negative.   Genitourinary: Positive for frequency.  Musculoskeletal: Positive for falls and neck pain. Negative for back pain and joint pain.       Chronic neck pain   All systems reviewed and otherwise negative except for as above  Family History  Problem Relation Age of Onset  . Cerebral aneurysm Mother   . Heart attack Father   . Alzheimer's disease Sister   . Emphysema Sister   . Stroke Sister   . Alzheimer's disease Sister   . Arthritis Sister   . Stroke Sister        Age 85   . Lung cancer Son     Past Medical History:  Diagnosis Date  . Cervicalgia   . Chronic kidney disease   . Chronic kidney disease, stage III (moderate) (HCC)   . Dermatophytosis of groin  and perianal area   . Dyspnea   . Dysrhythmia   . Hyperlipidemia LDL goal < 100   . Insomnia, unspecified   . Muscle weakness (generalized)   . Orthostatic hypotension   . Osteoarthrosis, unspecified whether generalized or localized, lower leg    osteoarthritis - upper thoracic area, left knee.  . Other abnormal glucose   . Other B-complex deficiencies   . Other malaise and fatigue   . Palpitations   . Senile cataract, unspecified   . Shortness of breath   . Stroke [facility](HCC)    small "stroke" - right sided weakness for a day.  . Stroke (HCC)   . Tachycardia, unspecified   . Undiagnosed cardiac murmurs   . Unspecified essential hypertension   . Unspecified vitamin D deficiency   . Urinary tract infection     Past Surgical History:  Procedure Laterality Date  . CATARACT EXTRACTION W/PHACO Bilateral 1994   Dr. Dione BoozeGroat  . CATARACT EXTRACTION, BILATERAL Bilateral   . EYE SURGERY     cataract  . RUPTURED GLOBE EXPLORATION AND REPAIR Left 09/03/2019   Procedure: Scleral laceration repair LEFT EYE, exam under anesthesia LEFT EYE;  Surgeon: Olivia CanterGroat, Richard Scott, MD;  [facility];  Service: Ophthalmology;  Laterality: Left;  . TOE AMPUTATION  2002  . TONSILLECTOMY  1944  . TONSILLECTOMY     younger yrs  . TOTAL KNEE ARTHROPLASTY Left 01/15/2015  Procedure: LEFT TOTAL KNEE ARTHROPLASTY;  Surgeon: Eugenia Mcalpine, MD;  Location: [facility] ORS;  Service: Orthopedics;  Laterality: Left;  . TUBAL LIGATION  1951    Social History:  reports that she has never smoked. She has never used smokeless tobacco. She reports previous alcohol use. She reports that she does not use drugs.  Allergies:  Allergies  Allergen Reactions  . Ace Inhibitors Cough  . Lisinopril Cough  . Statins Other (See Comments)    Body aches and pains    (Not in a hospital admission)   Prior to Admission medications   Medication Sig Start Date End Date Taking? Authorizing Provider  Acetaminophen 500 MG capsule Take 2  capsules (1,000 mg total) by mouth at bedtime. 09/25/19   Reed, Tiffany L, DO  b complex vitamins capsule Take 1 capsule by mouth daily.    [provider]  carvedilol (COREG) 6.25 MG tablet Take 1/2 tablet by mouth twice daily 07/15/20   Sharon Seller, NP  Cholecalciferol (VITAMIN D3) 10 MCG (400 UNIT) CAPS Take 400 Units by mouth daily.    [provider]  Cyanocobalamin 1000 MCG CAPS Take one capsule by mouth once daily.    [provider]  Lidocaine HCl 200 MG/10ML SOSY Place 25 mLs vaginally every 12 (twelve) hours as needed (for atrophy). 07/12/20   Sharon Seller, NP  mometasone (ELOCON) 0.1 % ointment Apply 1 application topically 2 (two) times daily. To vaginal area    [provider]  Multiple Vitamins-Minerals (PRESERVISION AREDS 2+MULTI VIT) CAPS Take 1 capsule by mouth 2 (two) times daily.     [provider]  ondansetron (ZOFRAN) 4 MG tablet Take 4 mg by mouth every 6 (six) hours as needed.    [provider]  polyethylene glycol (MIRALAX / GLYCOLAX) 17 g packet Take 17 g by mouth as needed.    [provider]  pyridoxine (B-6) 100 MG tablet Take 100 mg by mouth as needed. For burning upon urination.    [provider]  traMADol (ULTRAM) 50 MG tablet Take 1 tablet (50 mg total) by mouth 2 (two) times daily as needed for moderate pain (for pain). 04/12/20 04/12/21  Ngetich, Dinah C, NP  traZODone (DESYREL) 50 MG tablet Take 0.5 tablets (25 mg total) by mouth at bedtime. 09/25/19   Reed, Tiffany L, DO    Blood pressure (!) 121/105, pulse 89, temperature 97.8 F (36.6 C), temperature source Oral, resp. rate 15, height  (1.702 m), weight 62.6 kg, SpO2 98 %. Physical Exam: General: pleasant, elderly female who is laying in bed in NAD HEENT: head is normocephalic.  Sclera are noninjected.  PERRL.  Ears and nose without any masses or lesions.  Mouth is pink and moist. Dentition fair. 1cm laceration to left  forehead with adjacent abrasion. No c-spine TTP, and no neck pain with active ROM Heart: regular, rate, and rhythm.  Normal s1,s2. No obvious murmurs, gallops, or rubs noted.  Palpable pedal pulses bilaterally  Lungs: CTAB, no wheezes, rhonchi, or rales noted.  Respiratory effort nonlabored. Chest wall without ecchymosis and is nontender to palpation Abd: soft, NT/ND, +BS, no masses, hernias, or organomegaly MS: no BUE/BLE edema, calves soft and nontender. Multiple skin tears noted to bilateral lower extremities. No pain with active shoulder, elbow, wrist, finger, hip, knee, ankle ROM bilaterally Skin: warm and dry with no masses, lesions, or rashes Psych: A&Ox4 with an appropriate affect Neuro: cranial nerves grossly intact, equal strength in BUE/BLE  bilaterally, normal speech, thought process intact  Results for orders placed or performed during the hospital encounter of 08/10/20 (from the past 48 hour(s))  Resp Panel by RT-PCR (Flu A&B, Covid) Nasopharyngeal Swab     Status: None   Collection Time: 08/10/20 10:19 AM   Specimen: Nasopharyngeal Swab; Nasopharyngeal(NP) swabs in vial transport medium  Result Value Ref Range   SARS Coronavirus 2 by RT PCR NEGATIVE NEGATIVE    Comment: (NOTE) SARS-CoV-2 target nucleic acids are NOT DETECTED.  The SARS-CoV-2 RNA is generally detectable in upper respiratory specimens during the acute phase of infection. The lowest concentration of SARS-CoV-2 viral copies this assay can detect is 138 copies/mL. A negative result does not preclude SARS-Cov-2 infection and should not be used as the sole basis for treatment or other patient management decisions. A negative result may occur with  improper specimen collection/handling, submission of specimen other than nasopharyngeal swab, presence of viral mutation(s) within the areas targeted by this assay, and inadequate number of viral copies(<138 copies/mL). A negative result must be combined with clinical  observations, patient history, and epidemiological information. The expected result is Negative.  Fact Sheet for Patients:  BloggerCourse.com  Fact Sheet for Healthcare Providers:  SeriousBroker.it  This test is no t yet approved or cleared by the [place] FDA and  has been authorized for detection and/or diagnosis of SARS-CoV-2 by FDA under an Emergency Use Authorization (EUA). This EUA will remain  in effect (meaning this test can be used) for the duration of the COVID-19 declaration under Section 564(b)(1) of the Act, 21 U.S.C.section 360bbb-3(b)(1), unless the authorization is terminated  or revoked sooner.       Influenza A by PCR NEGATIVE NEGATIVE   Influenza B by PCR NEGATIVE NEGATIVE    Comment: (NOTE) The Xpert Xpress SARS-CoV-2/FLU/RSV plus assay is intended as an aid in the diagnosis of influenza from Nasopharyngeal swab specimens and should not be used as a sole basis for treatment. Nasal washings and aspirates are unacceptable for Xpert Xpress SARS-CoV-2/FLU/RSV testing.  Fact Sheet for Patients: BloggerCourse.com  Fact Sheet for Healthcare Providers: SeriousBroker.it  This test is not yet approved or cleared by the [place] FDA and has been authorized for detection and/or diagnosis of SARS-CoV-2 by FDA under an Emergency Use Authorization (EUA). This EUA will remain in effect (meaning this test can be used) for the duration of the COVID-19 declaration under Section 564(b)(1) of the Act, 21 U.S.C. section 360bbb-3(b)(1), unless the authorization is terminated or revoked.  Performed at [facility], [address] [place], [place] [address]   CBC with Differential     Status: Abnormal   Collection Time: 08/10/20 10:23 AM  Result Value Ref Range   WBC 9.4 4.0 - 10.5 K/uL   RBC 4.09 3.87 - 5.11 MIL/uL   Hemoglobin 11.7 (L) 12.0 -  15.0 g/dL   HCT 60.4 54.0 - 98.1 %   MCV 92.2 80.0 - 100.0 fL   MCH 28.6 26.0 - 34.0 pg   MCHC 31.0 30.0 - 36.0 g/dL   RDW 19.1 47.8 - 29.5 %   Platelets 331 150 - 400 K/uL   nRBC 0.0 0.0 - 0.2 %   Neutrophils Relative % 81 %   Neutro Abs 7.7 1.7 - 7.7 K/uL   Lymphocytes Relative 13 %   Lymphs Abs 1.2 0.7 - 4.0 K/uL   Monocytes Relative 4 %   Monocytes Absolute 0.3 0.1 - 1.0 K/uL   Eosinophils Relative 1 %  Eosinophils Absolute 0.1 0.0 - 0.5 K/uL   Basophils Relative 0 %   Basophils Absolute 0.0 0.0 - 0.1 K/uL   Immature Granulocytes 1 %   Abs Immature Granulocytes 0.06 0.00 - 0.07 K/uL    Comment: Performed at [facility], [address] [place], [place] [address]  Basic metabolic panel     Status: Abnormal   Collection Time: 08/10/20 10:23 AM  Result Value Ref Range   Sodium 142 135 - 145 mmol/L   Potassium 3.8 3.5 - 5.1 mmol/L   Chloride 106 98 - 111 mmol/L   CO2 28 22 - 32 mmol/L   Glucose, Bld 130 (H) 70 - 99 mg/dL    Comment: Glucose reference range applies only to samples taken after fasting for at least 8 hours.   BUN 19 8 - 23 mg/dL   Creatinine, Ser 3.33 0.44 - 1.00 mg/dL   Calcium 8.6 (L) 8.9 - 10.3 mg/dL   GFR, Estimated 56 (L) >60 mL/min    Comment: (NOTE) Calculated using the CKD-EPI Creatinine Equation (2021)    Anion gap 8 5 - 15    Comment: Performed at [facility], [address] [place], [place] [address]   CT Head Wo Contrast  Result Date: 08/10/2020 CLINICAL DATA:  Facial trauma, minor. EXAM: CT HEAD WITHOUT CONTRAST CT MAXILLOFACIAL WITHOUT CONTRAST CT CERVICAL SPINE WITHOUT CONTRAST TECHNIQUE: Multidetector CT imaging of the head, cervical spine, and maxillofacial structures were performed using the standard protocol without intravenous contrast. Multiplanar CT image reconstructions of the cervical spine and maxillofacial structures were also generated. COMPARISON:  07/03/2020 FINDINGS: CT HEAD FINDINGS Brain:  No evidence of acute infarction, hemorrhage, hydrocephalus, extra-axial collection or mass lesion/mass effect. Cerebral volume loss in keeping with aging. Small-vessel ischemic gliosis to a mild degree with chronic lacune at the left corona radiata. Vascular: No hyperdense vessel or unexpected calcification. Skull: Hematoma along the left forehead.  No calvarial fracture. CT MAXILLOFACIAL FINDINGS Osseous: Negative for fracture or mandibular dislocation. Orbits: No acute finding. Bowing of the medial wall on the left which may be from old fracture. Sinuses: Essentially complete opacification of the right maxillary sinus with internal frothy secretions noted. This opacification has progressed from a March 2022 comparison. Soft tissues: Left forehead contusion. CT CERVICAL SPINE FINDINGS Alignment: No traumatic malalignment Skull base and vertebrae: C4-5 mild anterolisthesis. Soft tissues and spinal canal: No prevertebral fluid or swelling. No visible canal hematoma. Disc levels: Multilevel degenerative disc narrowing and ridging. Generalized and occasionally bulky degenerative facet spurring. Facet ankylosis has occurred at C3-4. Multilevel foraminal impingement. Upper chest: No visible injury IMPRESSION: 1. No evidence of acute intracranial or cervical spine injury. 2. Forehead hematoma.  Negative for facial fracture. 3. Active right maxillary sinusitis with progression from March 2022. Electronically Signed   By: Marnee Spring M.D.   On: 08/10/2020 09:32   CT Chest Wo Contrast  Result Date: 08/10/2020 CLINICAL DATA:  Recent fall with possible pneumothorax on chest x-ray, initial encounter EXAM: CT CHEST WITHOUT CONTRAST TECHNIQUE: Multidetector CT imaging of the chest was performed following the standard protocol without IV contrast. COMPARISON:  CT from 04/10/2020, chest x-ray from earlier in the same day FINDINGS: Cardiovascular: Limited due to lack of IV contrast. Atherosclerotic calcifications of the  thoracic aorta are noted without aneurysmal dilatation. Coronary calcifications are seen. Cardiomegaly is noted. Mediastinum/Nodes: The esophagus as visualized is within normal limits. The thoracic inlet shows no focal abnormality. No sizable hilar or mediastinal adenopathy  is noted. Lungs/Pleura: The lungs are well aerated bilaterally. Minimal right-sided pleural effusion is noted. A few scattered small sub pleural nodules are noted. The largest of these is seen on image number 39 of series 4 measuring 4 mm in dimension. This is stable from the previous exam. No other sizable nodules are noted. No definitive pneumothorax is seen. Upper Abdomen: Visualized upper abdomen shows no acute abnormality. Musculoskeletal: Degenerative changes of the thoracic spine are noted. Multiple rib fractures are noted on the right involving the eighth through eleventh ribs posterolaterally. No significant displacement is noted. No definitive rib fractures are noted on the left. IMPRESSION: Multiple right rib fractures as described above. Tiny effusion is noted in a compensatory fashion. No definitive pneumothorax is noted on this exam. Multiple small pulmonary nodules as described. No follow-up needed if patient is low-risk (and has no known or suspected primary neoplasm). Non-contrast chest CT can be considered in 12 months if patient is high-risk. This recommendation follows the consensus statement: Guidelines for Management of Incidental Pulmonary Nodules Detected on CT Images: From the Fleischner Society 2017; Radiology 2017; [phone] Aortic Atherosclerosis (ICD10-I70.0). Electronically Signed   By: Alcide Clever M.D.   On: 08/10/2020 09:36   CT Cervical Spine Wo Contrast  Result Date: 08/10/2020 CLINICAL DATA:  Facial trauma, minor. EXAM: CT HEAD WITHOUT CONTRAST CT MAXILLOFACIAL WITHOUT CONTRAST CT CERVICAL SPINE WITHOUT CONTRAST TECHNIQUE: Multidetector CT imaging of the head, cervical spine, and maxillofacial structures  were performed using the standard protocol without intravenous contrast. Multiplanar CT image reconstructions of the cervical spine and maxillofacial structures were also generated. COMPARISON:  07/03/2020 FINDINGS: CT HEAD FINDINGS Brain: No evidence of acute infarction, hemorrhage, hydrocephalus, extra-axial collection or mass lesion/mass effect. Cerebral volume loss in keeping with aging. Small-vessel ischemic gliosis to a mild degree with chronic lacune at the left corona radiata. Vascular: No hyperdense vessel or unexpected calcification. Skull: Hematoma along the left forehead.  No calvarial fracture. CT MAXILLOFACIAL FINDINGS Osseous: Negative for fracture or mandibular dislocation. Orbits: No acute finding. Bowing of the medial wall on the left which may be from old fracture. Sinuses: Essentially complete opacification of the right maxillary sinus with internal frothy secretions noted. This opacification has progressed from a March 2022 comparison. Soft tissues: Left forehead contusion. CT CERVICAL SPINE FINDINGS Alignment: No traumatic malalignment Skull base and vertebrae: C4-5 mild anterolisthesis. Soft tissues and spinal canal: No prevertebral fluid or swelling. No visible canal hematoma. Disc levels: Multilevel degenerative disc narrowing and ridging. Generalized and occasionally bulky degenerative facet spurring. Facet ankylosis has occurred at C3-4. Multilevel foraminal impingement. Upper chest: No visible injury IMPRESSION: 1. No evidence of acute intracranial or cervical spine injury. 2. Forehead hematoma.  Negative for facial fracture. 3. Active right maxillary sinusitis with progression from March 2022. Electronically Signed   By: Marnee Spring M.D.   On: 08/10/2020 09:32   DG Chest Portable 1 View  Result Date: 08/10/2020 CLINICAL DATA:  Fall. EXAM: PORTABLE CHEST 1 VIEW COMPARISON:  Chest x-ray 01/04/2019. FINDINGS: Mediastinum and hilar structures normal. Cardiomegaly, no pulmonary venous  congestion. No focal infiltrate. No pleural effusion. Prominent skin fold versus small right pneumothorax. Repeat PA lateral chest x-ray suggested for further evaluation. No displaced rib fracture identified. IMPRESSION: Prominent skin fold versus small right-sided pneumothorax. Repeat PA and lateral chest x-ray suggested for further evaluation. No displaced rib fracture identified. Critical Value/emergent results were called by telephone at the time of interpretation on 08/10/2020 at 8:39 am to provider [facility] ,  who verbally acknowledged these results. Electronically Signed   By: Maisie Fus  Register   On: 08/10/2020 08:42   CT Maxillofacial Wo Contrast  Result Date: 08/10/2020 CLINICAL DATA:  Facial trauma, minor. EXAM: CT HEAD WITHOUT CONTRAST CT MAXILLOFACIAL WITHOUT CONTRAST CT CERVICAL SPINE WITHOUT CONTRAST TECHNIQUE: Multidetector CT imaging of the head, cervical spine, and maxillofacial structures were performed using the standard protocol without intravenous contrast. Multiplanar CT image reconstructions of the cervical spine and maxillofacial structures were also generated. COMPARISON:  07/03/2020 FINDINGS: CT HEAD FINDINGS Brain: No evidence of acute infarction, hemorrhage, hydrocephalus, extra-axial collection or mass lesion/mass effect. Cerebral volume loss in keeping with aging. Small-vessel ischemic gliosis to a mild degree with chronic lacune at the left corona radiata. Vascular: No hyperdense vessel or unexpected calcification. Skull: Hematoma along the left forehead.  No calvarial fracture. CT MAXILLOFACIAL FINDINGS Osseous: Negative for fracture or mandibular dislocation. Orbits: No acute finding. Bowing of the medial wall on the left which may be from old fracture. Sinuses: Essentially complete opacification of the right maxillary sinus with internal frothy secretions noted. This opacification has progressed from a March 2022 comparison. Soft tissues: Left forehead contusion. CT CERVICAL  SPINE FINDINGS Alignment: No traumatic malalignment Skull base and vertebrae: C4-5 mild anterolisthesis. Soft tissues and spinal canal: No prevertebral fluid or swelling. No visible canal hematoma. Disc levels: Multilevel degenerative disc narrowing and ridging. Generalized and occasionally bulky degenerative facet spurring. Facet ankylosis has occurred at C3-4. Multilevel foraminal impingement. Upper chest: No visible injury IMPRESSION: 1. No evidence of acute intracranial or cervical spine injury. 2. Forehead hematoma.  Negative for facial fracture. 3. Active right maxillary sinusitis with progression from March 2022. Electronically Signed   By: Marnee Spring M.D.   On: 08/10/2020 09:32      Assessment/Plan Fall Multiple R rib fxs 8-11 - unsure if these are from this fall or from a fall 2 weeks ago. Multimodal pain control and pulmonary toilet. Repeat CXR in AM Forehead laceration/ hematoma - lac very small, dermabond applied. Local wound care Multiple LE abrasions - local wound care Frequent falls - per daughter patient has had at least 10 falls in the last 6 months HTN HLD CKD Recurrent UTIs OAB Hx remote CVA Chronic neck pain - takes tramadol PRN Limited mobility - uses wheelchair most of the time  Plan - Recommend medical admission for observation. With her history of several falls over the last few months I think she would benefit from acute care PT/OT. She lives in an assisted living facility but may need more advanced care?  Official CT abdomen/pelvis read is pending.  Franne Forts, PA-C [facility] 08/10/2020, 12:42 PM Please see Amion for pager number during day hours 7:00am-4:30pm

## 2020-08-10 NOTE — ED Provider Notes (Signed)
[facility]   CSN: [id number] Arrival date & time: 08/10/20  0750     History Chief Complaint  Patient presents with  . Fall  . Head Laceration    Lisa [name] is a 87 y.o. female history of CKD, CVA, hyperlipidemia, hypertension.  Patient arrives from skilled living facility after a fall.  Patient reports she was getting out of bed this morning with the help of a nursing assistant when she missed her wheelchair falling and striking her forehead on the ground.  Patient reports immediate pain to her forehead aching and mild constant worse with palpation improves with rest.  Multiple abrasions and skin tears present fall primarily to the forehead also skin tears to the legs and arms.  Patient was placed in a c-collar by EMS and brought here for evaluation.  Patient is not anticoagulated.  Additionally patient reports some mild left shoulder pain aching worse with movement improves with rest and nonradiating.  Denies recent illness, fever/chills, neck pain, chest pain, back pain, shortness of breath, abdominal pain, numbness/tingling, weakness or any additional concerns. HPI     Past Medical History:  Diagnosis Date  . Cervicalgia   . Chronic kidney disease   . Chronic kidney disease, stage III (moderate) (HCC)   . Dermatophytosis of groin and perianal area   . Dyspnea   . Dysrhythmia   . Hyperlipidemia LDL goal < 100   . Insomnia, unspecified   . Muscle weakness (generalized)   . Orthostatic hypotension   . Osteoarthrosis, unspecified whether generalized or localized, lower leg    osteoarthritis - upper thoracic area, left knee.  . Other abnormal glucose   . Other B-complex deficiencies   . Other malaise and fatigue   . Palpitations   . Senile cataract, unspecified   . Shortness of breath   . Stroke [facility])    small "stroke" - right sided weakness for a day.  . Stroke (HCC)   . Tachycardia, unspecified   .  Undiagnosed cardiac murmurs   . Unspecified essential hypertension   . Unspecified vitamin D deficiency   . Urinary tract infection     Patient Active Problem List   Diagnosis Date Noted  . Rib fractures 08/10/2020  . Intermediate stage nonexudative age-related macular degeneration of left eye 01/05/2020  . Prolapse of iris in recent wound, left, subsequent encounter 09/16/2019  . Orthostatic syncope 09/04/2019  . Scleral laceration of left eye 09/03/2019  . Fall at home, initial encounter 09/03/2019  . Syncope and collapse 09/03/2019  . Generalized weakness 09/03/2019  . Recurrent UTI 09/03/2019  . Branch retinal vein occlusion with macular edema of right eye 07/07/2019  . Branch retinal vein occlusion with neovascularization of right eye 07/07/2019  . Cystoid macular edema of right eye 07/07/2019  . Exudative age-related macular degeneration of left eye with inactive choroidal neovascularization (HCC) 07/07/2019  . Genitourinary syndrome of menopause 07/01/2019  . Other fatigue 12/03/2017  . Anemia 12/03/2017  . Balance problem 12/03/2017  . Lichen sclerosus of female genitalia 12/03/2017  . Syncope 07/17/2017  . Neck pain, chronic 07/05/2015  . Status post total left knee replacement 07/05/2015  . Primary osteoarthritis of left knee 01/15/2015  . S/P knee replacement 01/15/2015  . Dry skin 01/28/2014  . HLD (hyperlipidemia) 01/06/2014  . Essential hypertension 01/06/2014  . B12 deficiency 09/29/2013  . Mixed hyperlipidemia 09/29/2013  . Hypertension, renal disease, stage 1-4 or unspecified chronic kidney disease 06/26/2013  . Seasonal  allergies 06/26/2013  . CVA (cerebral vascular accident) (HCC) 04/09/2013  . Stroke (HCC) 04/09/2013  . Insomnia 09/14/2012  . Hyperlipidemia   . Osteoarthritis of right knee   . Undiagnosed cardiac murmurs   . Shortness of breath   . Chronic kidney disease, stage III (moderate) (HCC)   . Cervicalgia   . Dyspnea 11/30/2008    Past  Surgical History:  Procedure Laterality Date  . CATARACT EXTRACTION W/PHACO Bilateral 1994   Dr. Dione Booze  . CATARACT EXTRACTION, BILATERAL Bilateral   . EYE SURGERY     cataract  . RUPTURED GLOBE EXPLORATION AND REPAIR Left 09/03/2019   Procedure: Scleral laceration repair LEFT EYE, exam under anesthesia LEFT EYE;  Surgeon: Olivia Canter, MD;  Location: [facility] OR;  Service: Ophthalmology;  Laterality: Left;  . TOE AMPUTATION  2002  . TONSILLECTOMY  1944  . TONSILLECTOMY     younger yrs  . TOTAL KNEE ARTHROPLASTY Left 01/15/2015   Procedure: LEFT TOTAL KNEE ARTHROPLASTY;  Surgeon: Eugenia Mcalpine, MD;  Location: [facility] ORS;  Service: Orthopedics;  Laterality: Left;  . TUBAL LIGATION  1951     OB History   No obstetric history on file.     Family History  Problem Relation Age of Onset  . Cerebral aneurysm Mother   . Heart attack Father   . Alzheimer's disease Sister   . Emphysema Sister   . Stroke Sister   . Alzheimer's disease Sister   . Arthritis Sister   . Stroke Sister        Age 21   . Lung cancer Son     Social History   Tobacco Use  . Smoking status: Never Smoker  . Smokeless tobacco: Never Used  Vaping Use  . Vaping Use: Never used  Substance Use Topics  . Alcohol use: Not Currently    Comment: rare social every 3 or 4 months wine  . Drug use: No    Home Medications Prior to Admission medications   Medication Sig Start Date End Date Taking? Authorizing Provider  Acetaminophen 500 MG capsule Take 2 capsules (1,000 mg total) by mouth at bedtime. 09/25/19   Reed, Tiffany L, DO  b complex vitamins capsule Take 1 capsule by mouth daily.    [provider]  carvedilol (COREG) 6.25 MG tablet Take 1/2 tablet by mouth twice daily 07/15/20   Sharon Seller, NP  Cholecalciferol (VITAMIN D3) 10 MCG (400 UNIT) CAPS Take 400 Units by mouth daily.    [provider]  Cyanocobalamin 1000 MCG CAPS Take one capsule by mouth once daily.    [provider]  Lidocaine HCl 200 MG/10ML SOSY Place 25 mLs vaginally every 12 (twelve) hours as needed (for atrophy). 07/12/20   Sharon Seller, NP  mometasone (ELOCON) 0.1 % ointment Apply 1 application topically 2 (two) times daily. To vaginal area    [provider]  Multiple Vitamins-Minerals (PRESERVISION AREDS 2+MULTI VIT) CAPS Take 1 capsule by mouth 2 (two) times daily.     [provider]  ondansetron (ZOFRAN) 4 MG tablet Take 4 mg by mouth every 6 (six) hours as needed.    [provider]  polyethylene glycol (MIRALAX / GLYCOLAX) 17 g packet Take 17 g by mouth as needed.    [provider]  pyridoxine (B-6) 100 MG tablet Take 100 mg by mouth as needed. For burning upon urination.    [provider]  traMADol (ULTRAM) 50 MG tablet Take 1 tablet (  50 mg total) by mouth 2 (two) times daily as needed for moderate pain (for pain). 04/12/20 04/12/21  Ngetich, Dinah C, NP  traZODone (DESYREL) 50 MG tablet Take 0.5 tablets (25 mg total) by mouth at bedtime. 09/25/19   Reed, Tiffany L, DO    Allergies    Ace inhibitors, Lisinopril, and Statins  Review of Systems   Review of Systems Ten systems are reviewed and are negative for acute change except as noted in the HPI  Physical Exam Updated Vital Signs BP (!) 121/105   Pulse 89   Temp 97.8 F (36.6 C) (Oral)   Resp 15   Ht 5\' 7"  (1.702 m)   Wt 62.6 kg   SpO2 98%   BMI 21.61 kg/m   Physical Exam Constitutional:      General: She is not in acute distress.    Appearance: Normal appearance. She is well-developed. She is not ill-appearing or diaphoretic.  HENT:     Head: Normocephalic. Abrasion and contusion present.     Jaw: No trismus.   Eyes:     General: Vision grossly intact. Gaze aligned appropriately.     Extraocular Movements: Extraocular movements intact.     Conjunctiva/sclera: Conjunctivae normal.     Pupils: Pupils are equal, round, and reactive to light.  Neck:      Trachea: Trachea and phonation normal. No tracheal tenderness or tracheal deviation.  Cardiovascular:     Pulses:          Radial pulses are 2+ on the right side and 2+ on the left side.       Dorsalis pedis pulses are 2+ on the right side and 2+ on the left side.  Pulmonary:     Effort: Pulmonary effort is normal. No respiratory distress.     Breath sounds: Normal breath sounds and air entry.  Chest:     Chest wall: No deformity or tenderness.  Abdominal:     General: There is no distension.     Palpations: Abdomen is soft.     Tenderness: There is no abdominal tenderness. There is no guarding or rebound.  Musculoskeletal:        General: Normal range of motion.     Cervical back: Normal range of motion and neck supple. No spinous process tenderness or muscular tenderness.     Comments: No midline C/T/L spinal tenderness to palpation, no paraspinal muscle tenderness, no deformity, crepitus, or step-off noted. No sign of injury to the neck or back.  Pelvis stable compression bilaterally without pain.  Patient is able to pull herself to a seated position without assistance - Mild TTP with palpation of the left shoulder.  No crepitus or deformity.  Neurovascular intact distally.  Several abrasions and skin tears present to all 4 extremities including left shoulder.  Skin:    General: Skin is warm and dry.  Neurological:     Mental Status: She is alert.     GCS: GCS eye subscore is 4. GCS verbal subscore is 5. GCS motor subscore is 6.     Comments: Speech is clear and goal oriented, follows commands Major Cranial nerves without deficit, no facial droop Moves extremities without ataxia, coordination intact  Psychiatric:        Behavior: Behavior normal.     ED Results / Procedures / Treatments   Labs (all labs ordered are listed, but only abnormal results are displayed) Labs Reviewed  CBC WITH DIFFERENTIAL/PLATELET - Abnormal; Notable for the following components:  Result Value    Hemoglobin 11.7 (*)    All other components within normal limits  BASIC METABOLIC PANEL - Abnormal; Notable for the following components:   Glucose, Bld 130 (*)    Calcium 8.6 (*)    GFR, Estimated 56 (*)    All other components within normal limits  RESP PANEL BY RT-PCR (FLU A&B, COVID) [id number]  CBC  CREATININE, SERUM    EKG None  Radiology CT Head Wo Contrast  Result Date: 08/10/2020 CLINICAL DATA:  Facial trauma, minor. EXAM: CT HEAD WITHOUT CONTRAST CT MAXILLOFACIAL WITHOUT CONTRAST CT CERVICAL SPINE WITHOUT CONTRAST TECHNIQUE: Multidetector CT imaging of the head, cervical spine, and maxillofacial structures were performed using the standard protocol without intravenous contrast. Multiplanar CT image reconstructions of the cervical spine and maxillofacial structures were also generated. COMPARISON:  07/03/2020 FINDINGS: CT HEAD FINDINGS Brain: No evidence of acute infarction, hemorrhage, hydrocephalus, extra-axial collection or mass lesion/mass effect. Cerebral volume loss in keeping with aging. Small-vessel ischemic gliosis to a mild degree with chronic lacune at the left corona radiata. Vascular: No hyperdense vessel or unexpected calcification. Skull: Hematoma along the left forehead.  No calvarial fracture. CT MAXILLOFACIAL FINDINGS Osseous: Negative for fracture or mandibular dislocation. Orbits: No acute finding. Bowing of the medial wall on the left which may be from old fracture. Sinuses: Essentially complete opacification of the right maxillary sinus with internal frothy secretions noted. This opacification has progressed from a March 2022 comparison. Soft tissues: Left forehead contusion. CT CERVICAL SPINE FINDINGS Alignment: No traumatic malalignment Skull base and vertebrae: C4-5 mild anterolisthesis. Soft tissues and spinal canal: No prevertebral fluid or swelling. No visible canal hematoma. Disc levels: Multilevel degenerative disc narrowing and ridging. Generalized and  occasionally bulky degenerative facet spurring. Facet ankylosis has occurred at C3-4. Multilevel foraminal impingement. Upper chest: No visible injury IMPRESSION: 1. No evidence of acute intracranial or cervical spine injury. 2. Forehead hematoma.  Negative for facial fracture. 3. Active right maxillary sinusitis with progression from March 2022. Electronically Signed   By: Marnee SpringJonathon  Watts M.D.   On: 08/10/2020 09:32   CT Chest Wo Contrast  Result Date: 08/10/2020 CLINICAL DATA:  Recent fall with possible pneumothorax on chest x-ray, initial encounter EXAM: CT CHEST WITHOUT CONTRAST TECHNIQUE: Multidetector CT imaging of the chest was performed following the standard protocol without IV contrast. COMPARISON:  CT from 04/10/2020, chest x-ray from earlier in the same day FINDINGS: Cardiovascular: Limited due to lack of IV contrast. Atherosclerotic calcifications of the thoracic aorta are noted without aneurysmal dilatation. Coronary calcifications are seen. Cardiomegaly is noted. Mediastinum/Nodes: The esophagus as visualized is within normal limits. The thoracic inlet shows no focal abnormality. No sizable hilar or mediastinal adenopathy is noted. Lungs/Pleura: The lungs are well aerated bilaterally. Minimal right-sided pleural effusion is noted. A few scattered small sub pleural nodules are noted. The largest of these is seen on image number 39 of series 4 measuring 4 mm in dimension. This is stable from the previous exam. No other sizable nodules are noted. No definitive pneumothorax is seen. Upper Abdomen: Visualized upper abdomen shows no acute abnormality. Musculoskeletal: Degenerative changes of the thoracic spine are noted. Multiple rib fractures are noted on the right involving the eighth through eleventh ribs posterolaterally. No significant displacement is noted. No definitive rib fractures are noted on the left. IMPRESSION: Multiple right rib fractures as described above. Tiny effusion is noted in a  compensatory fashion. No definitive pneumothorax is noted on this exam. Multiple small pulmonary nodules  as described. No follow-up needed if patient is low-risk (and has no known or suspected primary neoplasm). Non-contrast chest CT can be considered in 12 months if patient is high-risk. This recommendation follows the consensus statement: Guidelines for Management of Incidental Pulmonary Nodules Detected on CT Images: From the Fleischner Society 2017; Radiology 2017; [id number] Aortic Atherosclerosis (ICD10-I70.0). Electronically Signed   By: Alcide Clever M.D.   On: 08/10/2020 09:36   CT Cervical Spine Wo Contrast  Result Date: 08/10/2020 CLINICAL DATA:  Facial trauma, minor. EXAM: CT HEAD WITHOUT CONTRAST CT MAXILLOFACIAL WITHOUT CONTRAST CT CERVICAL SPINE WITHOUT CONTRAST TECHNIQUE: Multidetector CT imaging of the head, cervical spine, and maxillofacial structures were performed using the standard protocol without intravenous contrast. Multiplanar CT image reconstructions of the cervical spine and maxillofacial structures were also generated. COMPARISON:  07/03/2020 FINDINGS: CT HEAD FINDINGS Brain: No evidence of acute infarction, hemorrhage, hydrocephalus, extra-axial collection or mass lesion/mass effect. Cerebral volume loss in keeping with aging. Small-vessel ischemic gliosis to a mild degree with chronic lacune at the left corona radiata. Vascular: No hyperdense vessel or unexpected calcification. Skull: Hematoma along the left forehead.  No calvarial fracture. CT MAXILLOFACIAL FINDINGS Osseous: Negative for fracture or mandibular dislocation. Orbits: No acute finding. Bowing of the medial wall on the left which may be from old fracture. Sinuses: Essentially complete opacification of the right maxillary sinus with internal frothy secretions noted. This opacification has progressed from a March 2022 comparison. Soft tissues: Left forehead contusion. CT CERVICAL SPINE FINDINGS Alignment: No traumatic  malalignment Skull base and vertebrae: C4-5 mild anterolisthesis. Soft tissues and spinal canal: No prevertebral fluid or swelling. No visible canal hematoma. Disc levels: Multilevel degenerative disc narrowing and ridging. Generalized and occasionally bulky degenerative facet spurring. Facet ankylosis has occurred at C3-4. Multilevel foraminal impingement. Upper chest: No visible injury IMPRESSION: 1. No evidence of acute intracranial or cervical spine injury. 2. Forehead hematoma.  Negative for facial fracture. 3. Active right maxillary sinusitis with progression from March 2022. Electronically Signed   By: Marnee Spring M.D.   On: 08/10/2020 09:32   DG Chest Portable 1 View  Result Date: 08/10/2020 CLINICAL DATA:  Fall. EXAM: PORTABLE CHEST 1 VIEW COMPARISON:  Chest x-ray 01/04/2019. FINDINGS: Mediastinum and hilar structures normal. Cardiomegaly, no pulmonary venous congestion. No focal infiltrate. No pleural effusion. Prominent skin fold versus small right pneumothorax. Repeat PA lateral chest x-ray suggested for further evaluation. No displaced rib fracture identified. IMPRESSION: Prominent skin fold versus small right-sided pneumothorax. Repeat PA and lateral chest x-ray suggested for further evaluation. No displaced rib fracture identified. Critical Value/emergent results were called by telephone at the time of interpretation on 08/10/2020 at 8:39 am to provider [facility] , who verbally acknowledged these results. Electronically Signed   By: Maisie Fus  Register   On: 08/10/2020 08:42   CT Maxillofacial Wo Contrast  Result Date: 08/10/2020 CLINICAL DATA:  Facial trauma, minor. EXAM: CT HEAD WITHOUT CONTRAST CT MAXILLOFACIAL WITHOUT CONTRAST CT CERVICAL SPINE WITHOUT CONTRAST TECHNIQUE: Multidetector CT imaging of the head, cervical spine, and maxillofacial structures were performed using the standard protocol without intravenous contrast. Multiplanar CT image reconstructions of the cervical spine  and maxillofacial structures were also generated. COMPARISON:  07/03/2020 FINDINGS: CT HEAD FINDINGS Brain: No evidence of acute infarction, hemorrhage, hydrocephalus, extra-axial collection or mass lesion/mass effect. Cerebral volume loss in keeping with aging. Small-vessel ischemic gliosis to a mild degree with chronic lacune at the left corona radiata. Vascular: No hyperdense vessel or unexpected calcification. Skull: Hematoma  along the left forehead.  No calvarial fracture. CT MAXILLOFACIAL FINDINGS Osseous: Negative for fracture or mandibular dislocation. Orbits: No acute finding. Bowing of the medial wall on the left which may be from old fracture. Sinuses: Essentially complete opacification of the right maxillary sinus with internal frothy secretions noted. This opacification has progressed from a March 2022 comparison. Soft tissues: Left forehead contusion. CT CERVICAL SPINE FINDINGS Alignment: No traumatic malalignment Skull base and vertebrae: C4-5 mild anterolisthesis. Soft tissues and spinal canal: No prevertebral fluid or swelling. No visible canal hematoma. Disc levels: Multilevel degenerative disc narrowing and ridging. Generalized and occasionally bulky degenerative facet spurring. Facet ankylosis has occurred at C3-4. Multilevel foraminal impingement. Upper chest: No visible injury IMPRESSION: 1. No evidence of acute intracranial or cervical spine injury. 2. Forehead hematoma.  Negative for facial fracture. 3. Active right maxillary sinusitis with progression from March 2022. Electronically Signed   By: Marnee Spring M.D.   On: 08/10/2020 09:32    Procedures Procedures   Medications Ordered in ED Medications  morphine 2 MG/ML injection 2 mg (2 mg Intravenous Patient Refused/Not Given 08/10/20 0959)  traMADol (ULTRAM) tablet 50 mg (has no administration in time range)  traZODone (DESYREL) tablet 25 mg (has no administration in time range)  polyethylene glycol (MIRALAX / GLYCOLAX) packet  17 g (has no administration in time range)  Cyanocobalamin CAPS 1,000 mg (has no administration in time range)  Vitamin D3 CAPS 400 Units (has no administration in time range)  PreserVision AREDS 2+Multi Vit CAPS 1 capsule (has no administration in time range)  enoxaparin (LOVENOX) injection 40 mg (has no administration in time range)  acetaminophen (TYLENOL) tablet 650 mg (has no administration in time range)    Or  acetaminophen (TYLENOL) suppository 650 mg (has no administration in time range)  ondansetron (ZOFRAN) tablet 4 mg (has no administration in time range)    Or  ondansetron (ZOFRAN) injection 4 mg (has no administration in time range)  senna-docusate (Senokot-S) tablet 1 tablet (has no administration in time range)  carvedilol (COREG) tablet 6.25 mg (6.25 mg Oral Given 08/10/20 1233)    ED Course  I have reviewed the triage vital signs and the nursing notes.  Pertinent labs & imaging results that were available during my care of the patient were reviewed by me and considered in my medical decision making (see chart for details).    MDM Rules/Calculators/A&P                         Additional history obtained from: 1. Nursing notes from this visit. 2. Review of electronic medical records. 3. Family, patient's daughter at bedside. ============================ 87 year old female presented after mechanical fall at nursing home today.  Multiple skin tears and abrasions present, forehead, left arm, bilateral legs.  No complaints of neck pain chest pain abdominal pain or back pain.  Will obtain CT head, cervical spine and max face.  Additionally will obtain screening x-ray of the chest and pelvis.  Patient does report some pain of the left shoulder so will obtain x-ray of the left shoulder and left humerus as well.  Neurovascular intact. - CT Head/MaxFace/Cspine:  IMPRESSION:  1. No evidence of acute intracranial or cervical spine injury.  2. Forehead hematoma. Negative for  facial fracture.  3. Active right maxillary sinusitis with progression from March  2022.   DG Chest:  IMPRESSION:  Prominent skin fold versus small right-sided pneumothorax. Repeat PA  and lateral chest x-ray  suggested for further evaluation. No  displaced rib fracture identified.    Critical Value/emergent results were called by telephone at the time  of interpretation on 08/10/2020 at 8:39 am to provider [facility] , who verbally acknowledged these results.   After receiving this call reevaluate the patient, she was actually in the CT scanner room at time of the call.  I asked the staff to add on a CT chest for full evaluation rather than an AP lateral chest x-ray.  Discussed with Dr. Rhunette Croft who agrees. ================ CT Chest:    IMPRESSION:  Multiple right rib fractures as described above. Tiny effusion is  noted in a compensatory fashion. No definitive pneumothorax is noted  on this exam.    Multiple small pulmonary nodules as described. No follow-up needed  if patient is low-risk (and has no known or suspected primary  neoplasm). Non-contrast chest CT can be considered in 12 months if  patient is high-risk. This recommendation follows the consensus  statement: Guidelines for Management of Incidental Pulmonary Nodules  Detected on CT Images: From the Fleischner Society 2017; Radiology  2017; [phone]    Aortic Atherosclerosis (ICD10-I70.0).   Patient reassessed resting comfortably bed no acute distress denies chest pain or shortness of breath but considering the number of rib fractures in this elderly female will consult general surgery team for further recommendations. ------------ Consulted with general surgery, Derek Mound who recommends medicine admission and they will consult on the patient as she will need to be followed given number of fractures.  They have asked for a CT abdomen pelvis to be added to work-up. --------- CT AP: IMPRESSION: 1. Known  right-sided rib fractures involving 8 through 12. 2. Distended urinary bladder which could account for pericystic fat edema. Please correlate for cystitis. 3. Chronic findings are stable from 2021 and described above.  DG Left Shoulder: IMPRESSION: Degenerative changes and osteopenia. No acute osseous finding or malalignment.  DG Left Humerus: IMPRESSION: Negative.  DG Pelvis:IMPRESSION: Negative.  ========================= Consult with hospitalist and patient was accepted to medicine service.  On reassessment patient is resting comfortably no acute distress no additional complaints.  She was noted to be hypertensive during this visit, her home medication Coreg was ordered.  Subsequently blood pressure improved. Case discussed with Dr. Rhunette Croft who agrees with plan.  Note: Portions of this report may have been transcribed using voice recognition software. Every effort was made to ensure accuracy; however, inadvertent computerized transcription errors may still be present.  Final Clinical Impression(s) / ED Diagnoses Final diagnoses:  Closed fracture of multiple ribs of right side, initial encounter  Injury of head, initial encounter  Fall, initial encounter  Multiple skin tears    Rx / DC Orders ED Discharge Orders    None       Elizabeth Palau 08/10/20 1350    Derwood Kaplan, MD 08/11/20 1539

## 2020-08-10 NOTE — H&P (Signed)
History and Physical    Lisa [name] RAQ:[id number] DOB: 28-Sep-1921 DOA: 08/10/2020  PCP: Frederica Kuster, MD   Patient coming from: ALF  I have personally briefly reviewed patient's old medical records in [facility]  Chief Complaint: Fall  HPI: Lisa [name] is a 87 y.o. female with medical history significant of hypertension, hyperlipidemia, chronic kidney disease stage IIIb, recurrent UTIs, frequent falls thought to be related to orthostatic hypotension, history of unspecified CVA, chronic neck pain on tramadol presented today from her assisted living facility after suffering a ground-level fall.  She was apparently transferring from bed to wheelchair when she fell and struck the ground with her head and right side.  No loss of consciousness reported.  Denies any chest pain, palpitations, blurring of vision, shortness of breath, fever, nausea, vomiting.  Complains of pain to her left forehead.  No bowel or bladder incontinence or seizure-like activities.  As per the daughter present at bedside, patient has had more than 10 falls over the last 6 months.  Patient uses a wheelchair for mobilization.  ED Course: She had CT of the head, cervical spine, maxillofacial and chest which revealed multiple right rib fractures from 8-11.  General surgery was consulted who requested evaluation by hospitalist team for possible observation, PT evaluation and possible need for SNF. Hospitalist service was called to evaluate the patient.  Review of Systems: As per HPI otherwise all other systems were reviewed and are negative.  Patient is very hard of hearing so review of systems is slightly limited.   Past Medical History:  Diagnosis Date  . Cervicalgia   . Chronic kidney disease   . Chronic kidney disease, stage III (moderate) (HCC)   . Dermatophytosis of groin and perianal area   . Dyspnea   . Dysrhythmia   . Hyperlipidemia LDL goal < 100   . Insomnia, unspecified   . Muscle  weakness (generalized)   . Orthostatic hypotension   . Osteoarthrosis, unspecified whether generalized or localized, lower leg    osteoarthritis - upper thoracic area, left knee.  . Other abnormal glucose   . Other B-complex deficiencies   . Other malaise and fatigue   . Palpitations   . Senile cataract, unspecified   . Shortness of breath   . Stroke [facility])    small "stroke" - right sided weakness for a day.  . Stroke (HCC)   . Tachycardia, unspecified   . Undiagnosed cardiac murmurs   . Unspecified essential hypertension   . Unspecified vitamin D deficiency   . Urinary tract infection     Past Surgical History:  Procedure Laterality Date  . CATARACT EXTRACTION W/PHACO Bilateral 1994   Dr. Dione Booze  . CATARACT EXTRACTION, BILATERAL Bilateral   . EYE SURGERY     cataract  . RUPTURED GLOBE EXPLORATION AND REPAIR Left 09/03/2019   Procedure: Scleral laceration repair LEFT EYE, exam under anesthesia LEFT EYE;  Surgeon: Olivia Canter, MD;  Location: [facility] OR;  Service: Ophthalmology;  Laterality: Left;  . TOE AMPUTATION  2002  . TONSILLECTOMY  1944  . TONSILLECTOMY     younger yrs  . TOTAL KNEE ARTHROPLASTY Left 01/15/2015   Procedure: LEFT TOTAL KNEE ARTHROPLASTY;  Surgeon: Eugenia Mcalpine, MD;  Location: [facility] ORS;  Service: Orthopedics;  Laterality: Left;  . TUBAL LIGATION  1951     reports that she has never smoked. She has never used smokeless tobacco. She reports previous alcohol use. She reports that she does not use  drugs.  Allergies  Allergen Reactions  . Ace Inhibitors Cough  . Lisinopril Cough  . Statins Other (See Comments)    Body aches and pains    Family History  Problem Relation Age of Onset  . Cerebral aneurysm Mother   . Heart attack Father   . Alzheimer's disease Sister   . Emphysema Sister   . Stroke Sister   . Alzheimer's disease Sister   . Arthritis Sister   . Stroke Sister        Age 34   . Lung cancer Son     Prior to Admission medications    Medication Sig Start Date End Date Taking? Authorizing Provider  Acetaminophen 500 MG capsule Take 2 capsules (1,000 mg total) by mouth at bedtime. 09/25/19   Reed, Tiffany L, DO  b complex vitamins capsule Take 1 capsule by mouth daily.    [provider]  carvedilol (COREG) 6.25 MG tablet Take 1/2 tablet by mouth twice daily 07/15/20   Sharon Seller, NP  Cholecalciferol (VITAMIN D3) 10 MCG (400 UNIT) CAPS Take 400 Units by mouth daily.    [provider]  Cyanocobalamin 1000 MCG CAPS Take one capsule by mouth once daily.    [provider]  Lidocaine HCl 200 MG/10ML SOSY Place 25 mLs vaginally every 12 (twelve) hours as needed (for atrophy). 07/12/20   Sharon Seller, NP  mometasone (ELOCON) 0.1 % ointment Apply 1 application topically 2 (two) times daily. To vaginal area    [provider]  Multiple Vitamins-Minerals (PRESERVISION AREDS 2+MULTI VIT) CAPS Take 1 capsule by mouth 2 (two) times daily.     [provider]  ondansetron (ZOFRAN) 4 MG tablet Take 4 mg by mouth every 6 (six) hours as needed.    [provider]  polyethylene glycol (MIRALAX / GLYCOLAX) 17 g packet Take 17 g by mouth as needed.    [provider]  pyridoxine (B-6) 100 MG tablet Take 100 mg by mouth as needed. For burning upon urination.    [provider]  traMADol (ULTRAM) 50 MG tablet Take 1 tablet (50 mg total) by mouth 2 (two) times daily as needed for moderate pain (for pain). 04/12/20 04/12/21  Ngetich, Dinah C, NP  traZODone (DESYREL) 50 MG tablet Take 0.5 tablets (25 mg total) by mouth at bedtime. 09/25/19   Kermit Balo, DO    Physical Exam: Vitals:   08/10/20 1130 08/10/20 1145 08/10/20 1200 08/10/20 1215  BP:    (!) 121/105  Pulse: 86 87 90 89  Resp: 14 15 17 15   Temp:      TempSrc:      SpO2: 98% 98% 98% 98%  Weight:      Height:        Constitutional: Elderly female lying in bed.  Extremely hard of hearing.  No acute  distress. Vitals:   08/10/20 1130 08/10/20 1145 08/10/20 1200 08/10/20 1215  BP:    (!) 121/105  Pulse: 86 87 90 89  Resp: 14 15 17 15   Temp:      TempSrc:      SpO2: 98% 98% 98% 98%  Weight:      Height:       Eyes: PERRL, lids and conjunctivae normal Head and ENMT: Mucous membranes are moist. Posterior pharynx clear of any exudate or lesions.  Left forehead laceration with adjacent abrasion Neck: normal, supple, no masses, no thyromegaly.  No neck tenderness. Respiratory: bilateral decreased breath sounds  at bases, no wheezing, no crackles. Normal respiratory effort. No accessory muscle use.  Cardiovascular: S1 S2 positive, rate controlled. No extremity edema. 2+ pedal pulses.  Abdomen: no tenderness, no masses palpated. No hepatosplenomegaly. Bowel sounds positive.  Musculoskeletal: no clubbing / cyanosis. No joint deformity upper and lower extremities.  Multiple skin tears noted to bilateral lower extremities.  Left lower extremity dressing present Skin: no rashes, lesions, ulcers. No induration Neurologic: CN 2-12 grossly intact. Moving extremities. No focal neurologic deficits.  Psychiatric: Flat affect.   Labs on Admission: I have personally reviewed following labs and imaging studies  CBC: Recent Labs  Lab 08/10/20 1023  WBC 9.4  NEUTROABS 7.7  HGB 11.7*  HCT 37.7  MCV 92.2  PLT 331   Basic Metabolic Panel: Recent Labs  Lab 08/10/20 1023  NA 142  K 3.8  CL 106  CO2 28  GLUCOSE 130*  BUN 19  CREATININE 0.94  CALCIUM 8.6*   GFR: Estimated Creatinine Clearance: 34 mL/min (by C-G formula based on SCr of 0.94 mg/dL). Liver Function Tests: No results for input(s): AST, ALT, ALKPHOS, BILITOT, PROT, ALBUMIN in the last 168 hours. No results for input(s): LIPASE, AMYLASE in the last 168 hours. No results for input(s): AMMONIA in the last 168 hours. Coagulation Profile: No results for input(s): INR, PROTIME in the last 168 hours. Cardiac Enzymes: No results  for input(s): CKTOTAL, CKMB, CKMBINDEX, TROPONINI in the last 168 hours. BNP (last 3 results) No results for input(s): PROBNP in the last 8760 hours. HbA1C: No results for input(s): HGBA1C in the last 72 hours. CBG: No results for input(s): GLUCAP in the last 168 hours. Lipid Profile: No results for input(s): CHOL, HDL, LDLCALC, TRIG, CHOLHDL, LDLDIRECT in the last 72 hours. Thyroid Function Tests: No results for input(s): TSH, T4TOTAL, FREET4, T3FREE, THYROIDAB in the last 72 hours. Anemia Panel: No results for input(s): VITAMINB12, FOLATE, FERRITIN, TIBC, IRON, RETICCTPCT in the last 72 hours. Urine analysis:    Component Value Date/Time   COLORURINE YELLOW (A) 05/24/2020 1222   APPEARANCEUR TURBID (A) 05/24/2020 1222   LABSPEC 1.025 05/24/2020 1222   PHURINE 6.0 05/24/2020 1222   GLUCOSEU NEGATIVE 05/24/2020 1222   HGBUR MODERATE (A) 05/24/2020 1222   BILIRUBINUR neg 06/03/2020 1306   KETONESUR NEGATIVE 05/24/2020 1222   PROTEINUR Positive (A) 06/03/2020 1306   PROTEINUR 100 (A) 05/24/2020 1222   UROBILINOGEN negative (A) 06/03/2020 1306   UROBILINOGEN 0.2 01/08/2015 1005   NITRITE neg 06/03/2020 1306   NITRITE POSITIVE (A) 05/24/2020 1222   LEUKOCYTESUR Small (1+) (A) 06/03/2020 1306   LEUKOCYTESUR LARGE (A) 05/24/2020 1222    Radiological Exams on Admission: CT Head Wo Contrast  Result Date: 08/10/2020 CLINICAL DATA:  Facial trauma, minor. EXAM: CT HEAD WITHOUT CONTRAST CT MAXILLOFACIAL WITHOUT CONTRAST CT CERVICAL SPINE WITHOUT CONTRAST TECHNIQUE: Multidetector CT imaging of the head, cervical spine, and maxillofacial structures were performed using the standard protocol without intravenous contrast. Multiplanar CT image reconstructions of the cervical spine and maxillofacial structures were also generated. COMPARISON:  07/03/2020 FINDINGS: CT HEAD FINDINGS Brain: No evidence of acute infarction, hemorrhage, hydrocephalus, extra-axial collection or mass lesion/mass effect.  Cerebral volume loss in keeping with aging. Small-vessel ischemic gliosis to a mild degree with chronic lacune at the left corona radiata. Vascular: No hyperdense vessel or unexpected calcification. Skull: Hematoma along the left forehead.  No calvarial fracture. CT MAXILLOFACIAL FINDINGS Osseous: Negative for fracture or mandibular dislocation. Orbits: No acute finding. Bowing of the medial wall on  the left which may be from old fracture. Sinuses: Essentially complete opacification of the right maxillary sinus with internal frothy secretions noted. This opacification has progressed from a March 2022 comparison. Soft tissues: Left forehead contusion. CT CERVICAL SPINE FINDINGS Alignment: No traumatic malalignment Skull base and vertebrae: C4-5 mild anterolisthesis. Soft tissues and spinal canal: No prevertebral fluid or swelling. No visible canal hematoma. Disc levels: Multilevel degenerative disc narrowing and ridging. Generalized and occasionally bulky degenerative facet spurring. Facet ankylosis has occurred at C3-4. Multilevel foraminal impingement. Upper chest: No visible injury IMPRESSION: 1. No evidence of acute intracranial or cervical spine injury. 2. Forehead hematoma.  Negative for facial fracture. 3. Active right maxillary sinusitis with progression from March 2022. Electronically Signed   By: Marnee SpringJonathon  Watts M.D.   On: 08/10/2020 09:32   CT Chest Wo Contrast  Result Date: 08/10/2020 CLINICAL DATA:  Recent fall with possible pneumothorax on chest x-ray, initial encounter EXAM: CT CHEST WITHOUT CONTRAST TECHNIQUE: Multidetector CT imaging of the chest was performed following the standard protocol without IV contrast. COMPARISON:  CT from 04/10/2020, chest x-ray from earlier in the same day FINDINGS: Cardiovascular: Limited due to lack of IV contrast. Atherosclerotic calcifications of the thoracic aorta are noted without aneurysmal dilatation. Coronary calcifications are seen. Cardiomegaly is noted.  Mediastinum/Nodes: The esophagus as visualized is within normal limits. The thoracic inlet shows no focal abnormality. No sizable hilar or mediastinal adenopathy is noted. Lungs/Pleura: The lungs are well aerated bilaterally. Minimal right-sided pleural effusion is noted. A few scattered small sub pleural nodules are noted. The largest of these is seen on image number 39 of series 4 measuring 4 mm in dimension. This is stable from the previous exam. No other sizable nodules are noted. No definitive pneumothorax is seen. Upper Abdomen: Visualized upper abdomen shows no acute abnormality. Musculoskeletal: Degenerative changes of the thoracic spine are noted. Multiple rib fractures are noted on the right involving the eighth through eleventh ribs posterolaterally. No significant displacement is noted. No definitive rib fractures are noted on the left. IMPRESSION: Multiple right rib fractures as described above. Tiny effusion is noted in a compensatory fashion. No definitive pneumothorax is noted on this exam. Multiple small pulmonary nodules as described. No follow-up needed if patient is low-risk (and has no known or suspected primary neoplasm). Non-contrast chest CT can be considered in 12 months if patient is high-risk. This recommendation follows the consensus statement: Guidelines for Management of Incidental Pulmonary Nodules Detected on CT Images: From the Fleischner Society 2017; Radiology 2017; [phone] Aortic Atherosclerosis (ICD10-I70.0). Electronically Signed   By: Alcide CleverMark  Lukens M.D.   On: 08/10/2020 09:36   CT Cervical Spine Wo Contrast  Result Date: 08/10/2020 CLINICAL DATA:  Facial trauma, minor. EXAM: CT HEAD WITHOUT CONTRAST CT MAXILLOFACIAL WITHOUT CONTRAST CT CERVICAL SPINE WITHOUT CONTRAST TECHNIQUE: Multidetector CT imaging of the head, cervical spine, and maxillofacial structures were performed using the standard protocol without intravenous contrast. Multiplanar CT image reconstructions of  the cervical spine and maxillofacial structures were also generated. COMPARISON:  07/03/2020 FINDINGS: CT HEAD FINDINGS Brain: No evidence of acute infarction, hemorrhage, hydrocephalus, extra-axial collection or mass lesion/mass effect. Cerebral volume loss in keeping with aging. Small-vessel ischemic gliosis to a mild degree with chronic lacune at the left corona radiata. Vascular: No hyperdense vessel or unexpected calcification. Skull: Hematoma along the left forehead.  No calvarial fracture. CT MAXILLOFACIAL FINDINGS Osseous: Negative for fracture or mandibular dislocation. Orbits: No acute finding. Bowing of the medial wall on the left  which may be from old fracture. Sinuses: Essentially complete opacification of the right maxillary sinus with internal frothy secretions noted. This opacification has progressed from a March 2022 comparison. Soft tissues: Left forehead contusion. CT CERVICAL SPINE FINDINGS Alignment: No traumatic malalignment Skull base and vertebrae: C4-5 mild anterolisthesis. Soft tissues and spinal canal: No prevertebral fluid or swelling. No visible canal hematoma. Disc levels: Multilevel degenerative disc narrowing and ridging. Generalized and occasionally bulky degenerative facet spurring. Facet ankylosis has occurred at C3-4. Multilevel foraminal impingement. Upper chest: No visible injury IMPRESSION: 1. No evidence of acute intracranial or cervical spine injury. 2. Forehead hematoma.  Negative for facial fracture. 3. Active right maxillary sinusitis with progression from March 2022. Electronically Signed   By: Marnee Spring M.D.   On: 08/10/2020 09:32   DG Chest Portable 1 View  Result Date: 08/10/2020 CLINICAL DATA:  Fall. EXAM: PORTABLE CHEST 1 VIEW COMPARISON:  Chest x-ray 01/04/2019. FINDINGS: Mediastinum and hilar structures normal. Cardiomegaly, no pulmonary venous congestion. No focal infiltrate. No pleural effusion. Prominent skin fold versus small right pneumothorax.  Repeat PA lateral chest x-ray suggested for further evaluation. No displaced rib fracture identified. IMPRESSION: Prominent skin fold versus small right-sided pneumothorax. Repeat PA and lateral chest x-ray suggested for further evaluation. No displaced rib fracture identified. Critical Value/emergent results were called by telephone at the time of interpretation on 08/10/2020 at 8:39 am to provider [facility] , who verbally acknowledged these results. Electronically Signed   By: Maisie Fus  Register   On: 08/10/2020 08:42   CT Maxillofacial Wo Contrast  Result Date: 08/10/2020 CLINICAL DATA:  Facial trauma, minor. EXAM: CT HEAD WITHOUT CONTRAST CT MAXILLOFACIAL WITHOUT CONTRAST CT CERVICAL SPINE WITHOUT CONTRAST TECHNIQUE: Multidetector CT imaging of the head, cervical spine, and maxillofacial structures were performed using the standard protocol without intravenous contrast. Multiplanar CT image reconstructions of the cervical spine and maxillofacial structures were also generated. COMPARISON:  07/03/2020 FINDINGS: CT HEAD FINDINGS Brain: No evidence of acute infarction, hemorrhage, hydrocephalus, extra-axial collection or mass lesion/mass effect. Cerebral volume loss in keeping with aging. Small-vessel ischemic gliosis to a mild degree with chronic lacune at the left corona radiata. Vascular: No hyperdense vessel or unexpected calcification. Skull: Hematoma along the left forehead.  No calvarial fracture. CT MAXILLOFACIAL FINDINGS Osseous: Negative for fracture or mandibular dislocation. Orbits: No acute finding. Bowing of the medial wall on the left which may be from old fracture. Sinuses: Essentially complete opacification of the right maxillary sinus with internal frothy secretions noted. This opacification has progressed from a March 2022 comparison. Soft tissues: Left forehead contusion. CT CERVICAL SPINE FINDINGS Alignment: No traumatic malalignment Skull base and vertebrae: C4-5 mild anterolisthesis.  Soft tissues and spinal canal: No prevertebral fluid or swelling. No visible canal hematoma. Disc levels: Multilevel degenerative disc narrowing and ridging. Generalized and occasionally bulky degenerative facet spurring. Facet ankylosis has occurred at C3-4. Multilevel foraminal impingement. Upper chest: No visible injury IMPRESSION: 1. No evidence of acute intracranial or cervical spine injury. 2. Forehead hematoma.  Negative for facial fracture. 3. Active right maxillary sinusitis with progression from March 2022. Electronically Signed   By: Marnee Spring M.D.   On: 08/10/2020 09:32    Assessment/Plan  Multiple rib fractures from 8-11 Most likely mechanical fall in a patient with history of recurrent falls Forehead laceration/hematoma Multiple lower extremity abrasions -Patient apparently had a mechanical fall while transferring from bed to wheelchair.  Trauma/general surgery following and recommended admission under hospitalist service for rib fractures and PT  evaluation -Respiratory status stable.  No signs of flail chest.  Monitor respiratory status.  Currently on room air.  Supplemental oxygen as needed.  Repeat chest x-ray in a.m. -Local wound care for lower extremity abrasions.  Forehead laceration was treated with Dermabond: Continue local wound care -Patient mostly wheelchair bound  Hypertension -Monitor blood pressure.  Patient has issues with orthostatic hypotension and his Coreg was being tapered recently by his PCP.  Monitor blood pressure.  Hyperlipidemia -Currently not on any medications  CKD stage III venous-creatinine stable.  Monitor  Chronic neck pain -Uses as needed tramadol  Generalized deconditioning -Patient follows up with palliative care as an outpatient.  PT/OT eval: Might need SNF placement.  TOC consult   DVT prophylaxis: Lovenox Code Status: DNR Family Communication: Daughter at bedside Disposition Plan: ALF versus SNF in 1 to 2 days Consults called:  None Admission status: Observation/MedSurg  Severity of Illness: The appropriate patient status for this patient is OBSERVATION. Observation status is judged to be reasonable and necessary in order to provide the required intensity of service to ensure the patient's safety. The patient's presenting symptoms, physical exam findings, and initial radiographic and laboratory data in the context of their medical condition is felt to place them at decreased risk for further clinical deterioration. Furthermore, it is anticipated that the patient will be medically stable for discharge from the hospital within 2 midnights of admission. The following factors support the patient status of observation.   " The patient's presenting symptoms include fall. " The physical exam findings include for laceration, multiple lower extremity abrasions. " The initial radiographic and laboratory data are multiple right rib fractures.      Glade Lloyd MD [facility]  08/10/2020, 1:08 PM

## 2020-08-10 NOTE — ED Notes (Signed)
Fall Risk bundle: Yellow socks Fall risk armband Bed alarm on Fall risk sign outside door

## 2020-08-10 NOTE — ED Triage Notes (Signed)
Pt BIBA from [facility] with complaints of a witnessed fall. EMS reports someone was helping to transfer the pt and when transferring from bed to wheelchair, she leaned to far and slid out of bed causing her to fall on her left side. Pt reports hitting her head. Abrasions and swelling to the left side of her head and abrasions on left leg. EMS placed pt on a c-collar.   BP: 230/120 hx of HTN HR-90 98% room air CBG 140 RR-16

## 2020-08-11 ENCOUNTER — Observation Stay (HOSPITAL_COMMUNITY): Payer: Medicare Other

## 2020-08-11 DIAGNOSIS — Z801 Family history of malignant neoplasm of trachea, bronchus and lung: Secondary | ICD-10-CM | POA: Diagnosis not present

## 2020-08-11 DIAGNOSIS — Z66 Do not resuscitate: Secondary | ICD-10-CM | POA: Diagnosis present

## 2020-08-11 DIAGNOSIS — Z87440 Personal history of urinary (tract) infections: Secondary | ICD-10-CM | POA: Diagnosis not present

## 2020-08-11 DIAGNOSIS — S80812A Abrasion, left lower leg, initial encounter: Secondary | ICD-10-CM | POA: Diagnosis present

## 2020-08-11 DIAGNOSIS — E559 Vitamin D deficiency, unspecified: Secondary | ICD-10-CM | POA: Diagnosis present

## 2020-08-11 DIAGNOSIS — I129 Hypertensive chronic kidney disease with stage 1 through stage 4 chronic kidney disease, or unspecified chronic kidney disease: Secondary | ICD-10-CM | POA: Diagnosis present

## 2020-08-11 DIAGNOSIS — E539 Vitamin B deficiency, unspecified: Secondary | ICD-10-CM | POA: Diagnosis present

## 2020-08-11 DIAGNOSIS — M542 Cervicalgia: Secondary | ICD-10-CM | POA: Diagnosis present

## 2020-08-11 DIAGNOSIS — Z823 Family history of stroke: Secondary | ICD-10-CM | POA: Diagnosis not present

## 2020-08-11 DIAGNOSIS — N1831 Chronic kidney disease, stage 3a: Secondary | ICD-10-CM

## 2020-08-11 DIAGNOSIS — S2241XA Multiple fractures of ribs, right side, initial encounter for closed fracture: Secondary | ICD-10-CM | POA: Diagnosis present

## 2020-08-11 DIAGNOSIS — R918 Other nonspecific abnormal finding of lung field: Secondary | ICD-10-CM | POA: Diagnosis present

## 2020-08-11 DIAGNOSIS — Y92009 Unspecified place in unspecified non-institutional (private) residence as the place of occurrence of the external cause: Secondary | ICD-10-CM | POA: Diagnosis not present

## 2020-08-11 DIAGNOSIS — M79602 Pain in left arm: Secondary | ICD-10-CM | POA: Diagnosis present

## 2020-08-11 DIAGNOSIS — Z20822 Contact with and (suspected) exposure to covid-19: Secondary | ICD-10-CM | POA: Diagnosis present

## 2020-08-11 DIAGNOSIS — R296 Repeated falls: Secondary | ICD-10-CM | POA: Diagnosis present

## 2020-08-11 DIAGNOSIS — G8929 Other chronic pain: Secondary | ICD-10-CM | POA: Diagnosis present

## 2020-08-11 DIAGNOSIS — Z993 Dependence on wheelchair: Secondary | ICD-10-CM | POA: Diagnosis not present

## 2020-08-11 DIAGNOSIS — M25512 Pain in left shoulder: Secondary | ICD-10-CM | POA: Diagnosis present

## 2020-08-11 DIAGNOSIS — S0181XA Laceration without foreign body of other part of head, initial encounter: Secondary | ICD-10-CM | POA: Diagnosis present

## 2020-08-11 DIAGNOSIS — I700 Atherosclerosis of aorta: Secondary | ICD-10-CM

## 2020-08-11 DIAGNOSIS — Z89429 Acquired absence of other toe(s), unspecified side: Secondary | ICD-10-CM | POA: Diagnosis not present

## 2020-08-11 DIAGNOSIS — W19XXXA Unspecified fall, initial encounter: Secondary | ICD-10-CM | POA: Diagnosis not present

## 2020-08-11 DIAGNOSIS — S2249XA Multiple fractures of ribs, unspecified side, initial encounter for closed fracture: Secondary | ICD-10-CM | POA: Diagnosis present

## 2020-08-11 DIAGNOSIS — N1832 Chronic kidney disease, stage 3b: Secondary | ICD-10-CM | POA: Diagnosis present

## 2020-08-11 DIAGNOSIS — Y92099 Unspecified place in other non-institutional residence as the place of occurrence of the external cause: Secondary | ICD-10-CM | POA: Diagnosis not present

## 2020-08-11 DIAGNOSIS — S80811A Abrasion, right lower leg, initial encounter: Secondary | ICD-10-CM | POA: Diagnosis present

## 2020-08-11 DIAGNOSIS — W06XXXA Fall from bed, initial encounter: Secondary | ICD-10-CM | POA: Diagnosis present

## 2020-08-11 DIAGNOSIS — Z96652 Presence of left artificial knee joint: Secondary | ICD-10-CM | POA: Diagnosis present

## 2020-08-11 DIAGNOSIS — Z8673 Personal history of transient ischemic attack (TIA), and cerebral infarction without residual deficits: Secondary | ICD-10-CM | POA: Diagnosis not present

## 2020-08-11 LAB — COMPREHENSIVE METABOLIC PANEL
ALT: 15 U/L (ref 0–44)
AST: 15 U/L (ref 15–41)
Albumin: 3 g/dL — ABNORMAL LOW (ref 3.5–5.0)
Alkaline Phosphatase: 112 U/L (ref 38–126)
Anion gap: 7 (ref 5–15)
BUN: 26 mg/dL — ABNORMAL HIGH (ref 8–23)
CO2: 28 mmol/L (ref 22–32)
Calcium: 8.4 mg/dL — ABNORMAL LOW (ref 8.9–10.3)
Chloride: 105 mmol/L (ref 98–111)
Creatinine, Ser: 1.09 mg/dL — ABNORMAL HIGH (ref 0.44–1.00)
GFR, Estimated: 46 mL/min — ABNORMAL LOW (ref >60)
Glucose, Bld: 132 mg/dL — ABNORMAL HIGH (ref 70–99)
Potassium: 3.8 mmol/L (ref 3.5–5.1)
Sodium: 140 mmol/L (ref 135–145)
Total Bilirubin: 0.7 mg/dL (ref 0.3–1.2)
Total Protein: 5.9 g/dL — ABNORMAL LOW (ref 6.5–8.1)

## 2020-08-11 LAB — FOLATE: Folate: 10.2 ng/mL (ref >5.9)

## 2020-08-11 LAB — CBC
HCT: 34.4 % — ABNORMAL LOW (ref 36.0–46.0)
Hemoglobin: 10.6 g/dL — ABNORMAL LOW (ref 12.0–15.0)
MCH: 28.7 pg (ref 26.0–34.0)
MCHC: 30.8 g/dL (ref 30.0–36.0)
MCV: 93.2 fL (ref 80.0–100.0)
Platelets: 289 10*3/uL (ref 150–400)
RBC: 3.69 MIL/uL — ABNORMAL LOW (ref 3.87–5.11)
RDW: 14.8 % (ref 11.5–15.5)
WBC: 8.3 10*3/uL (ref 4.0–10.5)
nRBC: 0 % (ref 0.0–0.2)

## 2020-08-11 LAB — TSH: TSH: 2.3 u[IU]/mL (ref 0.350–4.500)

## 2020-08-11 LAB — MAGNESIUM: Magnesium: 2 mg/dL (ref 1.7–2.4)

## 2020-08-11 LAB — VITAMIN B12: Vitamin B-12: 765 pg/mL (ref 180–914)

## 2020-08-11 MED ORDER — ENOXAPARIN SODIUM 30 MG/0.3ML IJ SOSY
30.0000 mg | PREFILLED_SYRINGE | INTRAMUSCULAR | Status: DC
  Filled 2020-08-11: qty 0.3

## 2020-08-11 MED ORDER — HYDROMORPHONE HCL 1 MG/ML IJ SOLN
0.5000 mg | Freq: Once | INTRAMUSCULAR | Status: AC
  Administered 2020-08-11: 0.5 mg via INTRAMUSCULAR
  Filled 2020-08-11: qty 0.5

## 2020-08-11 MED ORDER — TRAMADOL HCL 50 MG PO TABS
50.0000 mg | ORAL_TABLET | Freq: Four times a day (QID) | ORAL | Status: DC | PRN
  Administered 2020-08-11 (×2): 50 mg via ORAL
  Filled 2020-08-11 (×2): qty 1

## 2020-08-11 NOTE — TOC Initial Note (Addendum)
Transition of Care [facility]) - Initial/Assessment Note    Patient Details  Name: Lisa [name] MRN: [id number] Date of Birth: 01-May-1921  Transition of Care [facility]) CM/SW Contact:    Lennart Pall, LCSW Phone Number: 08/11/2020, 3:31 PM  Clinical Narrative:                 Met with pt and daughter today to discuss dc plans.  Pt has been a resident at [facility] for ~ one year.  They both note she has had several falls and do not feel that the ALF can manage her current assistance needs.  I did speak with [place] (RN at [facility]) who is agreed that care needs exceed facility ability right now.  Pt and daughter agree to SW assist with SNF placement.  Will begin bed search and insurance authorization. Insurance auth begun ref # [id number].  Expected Discharge Plan: [place] Barriers to Discharge: Continued Medical Work up   Patient Goals and CMS Choice Patient states their goals for this hospitalization and ongoing recovery are:: to eventually return to ALF      Expected Discharge Plan and Services Expected Discharge Plan: [place] In-house Referral: Clinical Social Work     Living arrangements for the past 2 months: [place] ([place])                 DME Arranged: N/A DME Agency: NA                  Prior Living Arrangements/Services Living arrangements for the past 2 months: [facility]) Lives with:: Facility Resident Patient language and need for interpreter reviewed:: Yes Do you feel safe going back to the place where you live?: Yes      Need for Family Participation in Patient Care: No (Comment) Care giver support system in place?: Yes (comment)   Criminal Activity/Legal Involvement Pertinent to Current Situation/Hospitalization: No - Comment as needed  Activities of Daily Living Home Assistive Devices/Equipment: Gilford Rile (specify type) ADL Screening (condition at  time of admission) Patient's cognitive ability adequate to safely complete daily activities?: Yes Is the patient deaf or have difficulty hearing?: Yes Does the patient have difficulty seeing, even when wearing glasses/contacts?: Yes (hx of occulsion in right eye and has shots, fell and ruptured the left eyeball and and has macular degeneration) Does the patient have difficulty concentrating, remembering, or making decisions?: No Patient able to express need for assistance with ADLs?: Yes Does the patient have difficulty dressing or bathing?: Yes Independently performs ADLs?: No Communication: Independent Dressing (OT): Needs assistance Is this a change from baseline?: Pre-admission baseline Grooming: Independent Feeding: Independent Bathing: Needs assistance Is this a change from baseline?: Pre-admission baseline Toileting: Needs assistance Is this a change from baseline?: Pre-admission baseline In/Out Bed: Needs assistance Is this a change from baseline?: Pre-admission baseline Walks in Home: Needs assistance Is this a change from baseline?: Pre-admission baseline Does the patient have difficulty walking or climbing stairs?: Yes Weakness of Legs: Both Weakness of Arms/Hands: Both (both hands are numb)  Permission Sought/Granted Permission sought to share information with : Family Supports Permission granted to share information with : Yes, Verbal Permission Granted  Share Information with NAME: Ulice Dash     Permission granted to share info w Relationship: daughter  Permission granted to share info w Contact Information: [phone]  Emotional Assessment Appearance:: Appears stated age Attitude/Demeanor/Rapport: Gracious,Engaged Affect (typically observed): Accepting Orientation: : Oriented to Self,Oriented  to Place,Oriented to  Time,Oriented to Situation Alcohol / Substance Use: Not Applicable Psych Involvement: No (comment)  Admission diagnosis:  Rib fractures  [S22.49XA] Multiple skin tears [T14.8XXA] Injury of head, initial encounter [F29.02XJ] Fall, initial encounter [W19.XXXA] Closed fracture of multiple ribs of right side, initial encounter [S22.41XA] Multiple fractures of ribs, right side, initial encounter for closed fracture [S22.41XA] Patient Active Problem List   Diagnosis Date Noted  . Rib fractures 08/10/2020  . Intermediate stage nonexudative age-related macular degeneration of left eye 01/05/2020  . Prolapse of iris in recent wound, left, subsequent encounter 09/16/2019  . Orthostatic syncope 09/04/2019  . Scleral laceration of left eye 09/03/2019  . Fall at home, initial encounter 09/03/2019  . Syncope and collapse 09/03/2019  . Generalized weakness 09/03/2019  . Recurrent UTI 09/03/2019  . Branch retinal vein occlusion with macular edema of right eye 07/07/2019  . Branch retinal vein occlusion with neovascularization of right eye 07/07/2019  . Cystoid macular edema of right eye 07/07/2019  . Exudative age-related macular degeneration of left eye with inactive choroidal neovascularization ([place]) 07/07/2019  . Genitourinary syndrome of menopause 07/01/2019  . Other fatigue 12/03/2017  . Anemia 12/03/2017  . Balance problem 12/03/2017  . Lichen sclerosus of female genitalia 12/03/2017  . Syncope 07/17/2017  . Neck pain, chronic 07/05/2015  . Status post total left knee replacement 07/05/2015  . Primary osteoarthritis of left knee 01/15/2015  . S/P knee replacement 01/15/2015  . Dry skin 01/28/2014  . HLD (hyperlipidemia) 01/06/2014  . Essential hypertension 01/06/2014  . B12 deficiency 09/29/2013  . Mixed hyperlipidemia 09/29/2013  . Hypertension, renal disease, stage 1-4 or unspecified chronic kidney disease 06/26/2013  . Seasonal allergies 06/26/2013  . CVA (cerebral vascular accident) ([place]) 04/09/2013  . Stroke ([place]) 04/09/2013  . Insomnia 09/14/2012  . Hyperlipidemia   . Osteoarthritis of right knee   . Undiagnosed  cardiac murmurs   . Shortness of breath   . Chronic kidney disease, stage III (moderate) (HCC)   . Cervicalgia   . Dyspnea 11/30/2008   PCP:  Wardell Honour, MD Pharmacy:   [facility], [address] [place] [address] Phone: [phone] Fax: [phone]     Social Determinants of Health (SDOH) Interventions    Readmission Risk Interventions Readmission Risk Prevention Plan 09/04/2019  Post Dischage Appt Not Complete  Medication Screening Complete  Transportation Screening Complete  Some recent data might be hidden

## 2020-08-11 NOTE — Progress Notes (Signed)
[facility] Progress Note     Subjective: CC-  Up in chair, just worked with OT. States that she is having some pain in the left shoulder, otherwise no complaints. Denies CP or SOB. Pulling 1000 on IS.  No new injuries noted.  Objective: Vital signs in last 24 hours: Temp:  [98.2 F (36.8 C)-98.9 F (37.2 C)] 98.9 F (37.2 C) (05/25 0532) Pulse Rate:  [68-94] 68 (05/25 0532) Resp:  [14-22] 16 (05/25 0532) BP: (121-211)/(66-155) 175/91 (05/25 0532) SpO2:  [97 %-99 %] 98 % (05/25 0532)    Intake/Output from previous day: 05/24 0701 - 05/25 0700 In: 300 [P.O.:300] Out: 450 [Urine:450] Intake/Output this shift: Total I/O In: 240 [P.O.:240] Out: -   PE: Gen:  Alert, NAD, pleasant HEENT: EOM's intact, pupils equal and round. 1cm laceration to left forehead s/p dermabond with adjacent abrasion, no signs of infection Card:  RRR Pulm:  CTAB, no W/R/R, rate and effort normal on room air Abd: Soft, NT/ND, +BS Skin: warm and dry LUE: no gross deformity, no ecchymosis or edema. Mild TTP around surgical neck of humerus with pain in this area with active shoulder abduction  Lab Results:  Recent Labs    08/10/20 1023 08/11/20 0305  WBC 9.4 8.3  HGB 11.7* 10.6*  HCT 37.7 34.4*  PLT 331 289   BMET Recent Labs    08/10/20 1023 08/11/20 0305  NA 142 140  K 3.8 3.8  CL 106 105  CO2 28 28  GLUCOSE 130* 132*  BUN 19 26*  CREATININE 0.94 1.09*  CALCIUM 8.6* 8.4*   PT/INR No results for input(s): LABPROT, INR in the last 72 hours. CMP     Component Value Date/Time   NA 140 08/11/2020 0305   NA 141 03/26/2015 0804   K 3.8 08/11/2020 0305   CL 105 08/11/2020 0305   CO2 28 08/11/2020 0305   GLUCOSE 132 (H) 08/11/2020 0305   BUN 26 (H) 08/11/2020 0305   BUN 25 03/26/2015 0804   CREATININE 1.09 (H) 08/11/2020 0305   CREATININE 1.27 (H) 06/23/2020 0000   CALCIUM 8.4 (L) 08/11/2020 0305   PROT 5.9 (L) 08/11/2020 0305   PROT 6.2 03/26/2015 0804   ALBUMIN  3.0 (L) 08/11/2020 0305   ALBUMIN 3.9 03/26/2015 0804   AST 15 08/11/2020 0305   ALT 15 08/11/2020 0305   ALKPHOS 112 08/11/2020 0305   BILITOT 0.7 08/11/2020 0305   BILITOT 0.5 03/26/2015 0804   GFRNONAA 46 (L) 08/11/2020 0305   GFRNONAA 36 (L) 06/23/2020 0000   GFRAA 41 (L) 06/23/2020 0000   Lipase  No results found for: LIPASE     Studies/Results: CT ABDOMEN PELVIS WO CONTRAST  Result Date: 08/10/2020 CLINICAL DATA:  Fall with rib fractures by x-ray. EXAM: CT ABDOMEN AND PELVIS WITHOUT CONTRAST TECHNIQUE: Multidetector CT imaging of the abdomen and pelvis was performed following the standard protocol without IV contrast. COMPARISON:  09/02/2019 FINDINGS: Lower chest:  No contributory findings. Hepatobiliary: No focal liver abnormality.Cholelithiasis. Interval clearance of CBD high-density. Pancreas: Generalized atrophy Spleen: Unremarkable. Adrenals/Urinary Tract: Negative adrenals. No hydronephrosis or stone. Bilateral renal cystic densities. Full urinary bladder which may account for pericystic fat stranding. No internal or mural high-density to implicate a traumatic cause. Stomach/Bowel:  No obstruction. No appendicitis. Vascular/Lymphatic: No acute vascular abnormality. Diffuse atheromatous calcification of the aorta and iliacs. Dilated infrarenal aorta measuring up to 2.6 cm in diameter. No mass or adenopathy. Reproductive:Unremarkable for age Other: No ascites or pneumoperitoneum. Musculoskeletal:  Right posterior rib fractures involving 9, 10, 11, and 12. On the prior chest CT the right 8th rib was also involved. Ordinary spinal degeneration. Generalized osteopenia. IMPRESSION: 1. Known right-sided rib fractures involving 8 through 12. 2. Distended urinary bladder which could account for pericystic fat edema. Please correlate for cystitis. 3. Chronic findings are stable from 2021 and described above. Electronically Signed   By: Marnee Spring M.D.   On: 08/10/2020 11:57   DG Pelvis  1-2 Views  Result Date: 08/10/2020 CLINICAL DATA:  Pelvic pain after fall. EXAM: PELVIS - 1-2 VIEW COMPARISON:  None. FINDINGS: There is no evidence of pelvic fracture or diastasis. No pelvic bone lesions are seen. IMPRESSION: Negative. Electronically Signed   By: Lupita Raider M.D.   On: 08/10/2020 12:07   CT Head Wo Contrast  Result Date: 08/10/2020 CLINICAL DATA:  Facial trauma, minor. EXAM: CT HEAD WITHOUT CONTRAST CT MAXILLOFACIAL WITHOUT CONTRAST CT CERVICAL SPINE WITHOUT CONTRAST TECHNIQUE: Multidetector CT imaging of the head, cervical spine, and maxillofacial structures were performed using the standard protocol without intravenous contrast. Multiplanar CT image reconstructions of the cervical spine and maxillofacial structures were also generated. COMPARISON:  07/03/2020 FINDINGS: CT HEAD FINDINGS Brain: No evidence of acute infarction, hemorrhage, hydrocephalus, extra-axial collection or mass lesion/mass effect. Cerebral volume loss in keeping with aging. Small-vessel ischemic gliosis to a mild degree with chronic lacune at the left corona radiata. Vascular: No hyperdense vessel or unexpected calcification. Skull: Hematoma along the left forehead.  No calvarial fracture. CT MAXILLOFACIAL FINDINGS Osseous: Negative for fracture or mandibular dislocation. Orbits: No acute finding. Bowing of the medial wall on the left which may be from old fracture. Sinuses: Essentially complete opacification of the right maxillary sinus with internal frothy secretions noted. This opacification has progressed from a March 2022 comparison. Soft tissues: Left forehead contusion. CT CERVICAL SPINE FINDINGS Alignment: No traumatic malalignment Skull base and vertebrae: C4-5 mild anterolisthesis. Soft tissues and spinal canal: No prevertebral fluid or swelling. No visible canal hematoma. Disc levels: Multilevel degenerative disc narrowing and ridging. Generalized and occasionally bulky degenerative facet spurring. Facet  ankylosis has occurred at C3-4. Multilevel foraminal impingement. Upper chest: No visible injury IMPRESSION: 1. No evidence of acute intracranial or cervical spine injury. 2. Forehead hematoma.  Negative for facial fracture. 3. Active right maxillary sinusitis with progression from March 2022. Electronically Signed   By: Marnee Spring M.D.   On: 08/10/2020 09:32   CT Chest Wo Contrast  Result Date: 08/10/2020 CLINICAL DATA:  Recent fall with possible pneumothorax on chest x-ray, initial encounter EXAM: CT CHEST WITHOUT CONTRAST TECHNIQUE: Multidetector CT imaging of the chest was performed following the standard protocol without IV contrast. COMPARISON:  CT from 04/10/2020, chest x-ray from earlier in the same day FINDINGS: Cardiovascular: Limited due to lack of IV contrast. Atherosclerotic calcifications of the thoracic aorta are noted without aneurysmal dilatation. Coronary calcifications are seen. Cardiomegaly is noted. Mediastinum/Nodes: The esophagus as visualized is within normal limits. The thoracic inlet shows no focal abnormality. No sizable hilar or mediastinal adenopathy is noted. Lungs/Pleura: The lungs are well aerated bilaterally. Minimal right-sided pleural effusion is noted. A few scattered small sub pleural nodules are noted. The largest of these is seen on image number 39 of series 4 measuring 4 mm in dimension. This is stable from the previous exam. No other sizable nodules are noted. No definitive pneumothorax is seen. Upper Abdomen: Visualized upper abdomen shows no acute abnormality. Musculoskeletal: Degenerative changes of the thoracic spine  are noted. Multiple rib fractures are noted on the right involving the eighth through eleventh ribs posterolaterally. No significant displacement is noted. No definitive rib fractures are noted on the left. IMPRESSION: Multiple right rib fractures as described above. Tiny effusion is noted in a compensatory fashion. No definitive pneumothorax is  noted on this exam. Multiple small pulmonary nodules as described. No follow-up needed if patient is low-risk (and has no known or suspected primary neoplasm). Non-contrast chest CT can be considered in 12 months if patient is high-risk. This recommendation follows the consensus statement: Guidelines for Management of Incidental Pulmonary Nodules Detected on CT Images: From the Fleischner Society 2017; Radiology 2017; [phone] Aortic Atherosclerosis (ICD10-I70.0). Electronically Signed   By: Alcide Clever M.D.   On: 08/10/2020 09:36   CT Cervical Spine Wo Contrast  Result Date: 08/10/2020 CLINICAL DATA:  Facial trauma, minor. EXAM: CT HEAD WITHOUT CONTRAST CT MAXILLOFACIAL WITHOUT CONTRAST CT CERVICAL SPINE WITHOUT CONTRAST TECHNIQUE: Multidetector CT imaging of the head, cervical spine, and maxillofacial structures were performed using the standard protocol without intravenous contrast. Multiplanar CT image reconstructions of the cervical spine and maxillofacial structures were also generated. COMPARISON:  07/03/2020 FINDINGS: CT HEAD FINDINGS Brain: No evidence of acute infarction, hemorrhage, hydrocephalus, extra-axial collection or mass lesion/mass effect. Cerebral volume loss in keeping with aging. Small-vessel ischemic gliosis to a mild degree with chronic lacune at the left corona radiata. Vascular: No hyperdense vessel or unexpected calcification. Skull: Hematoma along the left forehead.  No calvarial fracture. CT MAXILLOFACIAL FINDINGS Osseous: Negative for fracture or mandibular dislocation. Orbits: No acute finding. Bowing of the medial wall on the left which may be from old fracture. Sinuses: Essentially complete opacification of the right maxillary sinus with internal frothy secretions noted. This opacification has progressed from a March 2022 comparison. Soft tissues: Left forehead contusion. CT CERVICAL SPINE FINDINGS Alignment: No traumatic malalignment Skull base and vertebrae: C4-5 mild  anterolisthesis. Soft tissues and spinal canal: No prevertebral fluid or swelling. No visible canal hematoma. Disc levels: Multilevel degenerative disc narrowing and ridging. Generalized and occasionally bulky degenerative facet spurring. Facet ankylosis has occurred at C3-4. Multilevel foraminal impingement. Upper chest: No visible injury IMPRESSION: 1. No evidence of acute intracranial or cervical spine injury. 2. Forehead hematoma.  Negative for facial fracture. 3. Active right maxillary sinusitis with progression from March 2022. Electronically Signed   By: Marnee Spring M.D.   On: 08/10/2020 09:32   DG CHEST PORT 1 VIEW  Result Date: 08/11/2020 CLINICAL DATA:  Rib fractures of right ribs. EXAM: PORTABLE CHEST 1 VIEW COMPARISON:  Chest x-ray 08/10/2020, CT chest 08/10/2020 FINDINGS: The heart size and mediastinal contours are unchanged. Aortic calcification. No focal consolidation. No pulmonary edema. Question trace pleural fluid at the right apex. No left pleural effusion. No pneumothorax. No acute osseous abnormality. Known rib fractures not well visualized. IMPRESSION: Question trace pleural fluid at the right apex. Electronically Signed   By: Tish Frederickson M.D.   On: 08/11/2020 05:30   DG Chest Portable 1 View  Result Date: 08/10/2020 CLINICAL DATA:  Fall. EXAM: PORTABLE CHEST 1 VIEW COMPARISON:  Chest x-ray 01/04/2019. FINDINGS: Mediastinum and hilar structures normal. Cardiomegaly, no pulmonary venous congestion. No focal infiltrate. No pleural effusion. Prominent skin fold versus small right pneumothorax. Repeat PA lateral chest x-ray suggested for further evaluation. No displaced rib fracture identified. IMPRESSION: Prominent skin fold versus small right-sided pneumothorax. Repeat PA and lateral chest x-ray suggested for further evaluation. No displaced rib fracture identified. Critical Value/emergent  results were called by telephone at the time of interpretation on 08/10/2020 at 8:39 am to  provider [facility] , who verbally acknowledged these results. Electronically Signed   By: Maisie Fus  Register   On: 08/10/2020 08:42   DG Shoulder Left  Result Date: 08/10/2020 CLINICAL DATA:  Fall, pain EXAM: LEFT SHOULDER - 2+ VIEW COMPARISON:  08/10/2020 FINDINGS: Limited two-view exam. Bones are osteopenic. Minor degenerative changes of the [facility] joint and glenohumeral joint. No acute osseous finding or malalignment. No displaced fracture. Included left chest unremarkable. Aorta atherosclerotic. IMPRESSION: Degenerative changes and osteopenia. No acute osseous finding or malalignment. Electronically Signed   By: Judie Petit.  Shick M.D.   On: 08/10/2020 12:05   DG Humerus Left  Result Date: 08/10/2020 CLINICAL DATA:  Left arm pain after fall. EXAM: LEFT HUMERUS - 2+ VIEW COMPARISON:  None. FINDINGS: There is no evidence of fracture or other focal bone lesions. Soft tissues are unremarkable. IMPRESSION: Negative. Electronically Signed   By: Lupita Raider M.D.   On: 08/10/2020 12:06   CT Maxillofacial Wo Contrast  Result Date: 08/10/2020 CLINICAL DATA:  Facial trauma, minor. EXAM: CT HEAD WITHOUT CONTRAST CT MAXILLOFACIAL WITHOUT CONTRAST CT CERVICAL SPINE WITHOUT CONTRAST TECHNIQUE: Multidetector CT imaging of the head, cervical spine, and maxillofacial structures were performed using the standard protocol without intravenous contrast. Multiplanar CT image reconstructions of the cervical spine and maxillofacial structures were also generated. COMPARISON:  07/03/2020 FINDINGS: CT HEAD FINDINGS Brain: No evidence of acute infarction, hemorrhage, hydrocephalus, extra-axial collection or mass lesion/mass effect. Cerebral volume loss in keeping with aging. Small-vessel ischemic gliosis to a mild degree with chronic lacune at the left corona radiata. Vascular: No hyperdense vessel or unexpected calcification. Skull: Hematoma along the left forehead.  No calvarial fracture. CT MAXILLOFACIAL FINDINGS Osseous:  Negative for fracture or mandibular dislocation. Orbits: No acute finding. Bowing of the medial wall on the left which may be from old fracture. Sinuses: Essentially complete opacification of the right maxillary sinus with internal frothy secretions noted. This opacification has progressed from a March 2022 comparison. Soft tissues: Left forehead contusion. CT CERVICAL SPINE FINDINGS Alignment: No traumatic malalignment Skull base and vertebrae: C4-5 mild anterolisthesis. Soft tissues and spinal canal: No prevertebral fluid or swelling. No visible canal hematoma. Disc levels: Multilevel degenerative disc narrowing and ridging. Generalized and occasionally bulky degenerative facet spurring. Facet ankylosis has occurred at C3-4. Multilevel foraminal impingement. Upper chest: No visible injury IMPRESSION: 1. No evidence of acute intracranial or cervical spine injury. 2. Forehead hematoma.  Negative for facial fracture. 3. Active right maxillary sinusitis with progression from March 2022. Electronically Signed   By: Marnee Spring M.D.   On: 08/10/2020 09:32    Anti-infectives: Anti-infectives (From admission, onward)   None       Assessment/Plan Fall Multiple R rib fxs 8-11 - Multimodal pain control and pulmonary toilet. Follow up CXR 5/25 stable without PNX Forehead laceration/ hematoma - s/p dermabond 5/24. Local wound care Multiple LE abrasions - local wound care LUE pain - xray negative for fracture, possible bone contusion. Pain control, ice/heat Frequent falls - per daughter patient has had at least 10 falls in the last 6 months HTN HLD CKD Recurrent UTIs OAB Hx remote CVA Chronic neck pain - takes tramadol PRN Limited mobility - uses wheelchair most of the time  Plan - PT/OT evaluations.    LOS: 0 days    Franne Forts, [facility] 08/11/2020, 9:15 AM Please see Amion for pager  number during day hours 7:00am-4:30pm

## 2020-08-11 NOTE — Hospital Course (Addendum)
87 year old woman from [place] presented after a fall when transferring from bed to wheelchair.  Work-up revealed multiple right-sided rib fractures and patient was seen by general surgery and admitted for further evaluation and monitoring.  A & P  Mechanical fall with multiple right-sided rib fractures.  Imaging CT head, neck and maxillofacial without acute abnormalities.  CT chest confirmed rib fractures.  CT abdomen pelvis otherwise unremarkable.  Left humeral film negative, pelvis films negative, left shoulder film negative. -- Appreciate general surgery, chest x-ray without evidence of complicating features, no pneumothorax.  Continue multimodal pain control and pulmonary toilet. --Frequent falls at home by report.  Forehead laceration, hematoma status post Dermabond 5/24, multiple lower extremity abrasions --Local wound care  Left upper extremity pain --X-ray negative for fracture.  Possible bone contusion.  Pain control, ice, heat.  CKD stage IIIa -- Stable.  Multiple small pulmonary nodules. No follow-up needed if patient is low-risk (and has no known or suspected primary neoplasm). Non-contrast chest CT can be considered in 12 months if patient is high-risk.   Aortic Atherosclerosis (ICD10-I70.0).

## 2020-08-11 NOTE — Progress Notes (Signed)
Initial Nutrition Assessment  INTERVENTION:   -Provide Ensure Enlive po BID, each supplement provides 350 kcal and 20 grams of protein  -Recommend diet liberalization  NUTRITION DIAGNOSIS:   Increased nutrient needs related to  (acute injuries) as evidenced by estimated needs.  GOAL:   Patient will meet greater than or equal to 90% of their needs  MONITOR:   PO intake,Supplement acceptance,Labs,Weight trends,I & O's,Skin  REASON FOR ASSESSMENT:   Malnutrition Screening Tool    ASSESSMENT:   87yo female PMH HTN, HLD, CKD, recurrent UTIs, OAB, hx remote CVA, chronic neck pain on tramadol who presented to [facility] earlier today after suffering a ground level fall.  Patient in room with daughter at bedside. Pt reports she doesn't have great appetite but eats something off her meals she is provided from her living facility. Pt states she drinks Boost at home and states she drank an Ensure this morning. Pt had a fruit plate for lunch.   Pt expected to go to rehab following discharge. Recommend continue supplements then. Pt has had multiple falls over the past 6 months.  Per weight records, pt's weight is stable.  Medications: Vitamin D, Prosight MVI, Vitamin B-12  Labs reviewed.  NUTRITION - FOCUSED PHYSICAL EXAM:  Flowsheet Row Most Recent Value  Orbital Region Unable to assess  Upper Arm Region Unable to assess  Thoracic and Lumbar Region Unable to assess  Buccal Region Unable to assess  Temple Region Unable to assess  Clavicle Bone Region Mild depletion  Clavicle and Acromion Bone Region Mild depletion  Scapular Bone Region Mild depletion  Dorsal Hand Mild depletion  Patellar Region Unable to assess  Anterior Thigh Region Unable to assess  Posterior Calf Region Unable to assess  Edema (RD Assessment) None  Hair Reviewed  Eyes Reviewed  Mouth Reviewed  Skin Reviewed       Diet Order:   Diet Order            Diet Heart Room service appropriate? Yes; Fluid  consistency: Thin  Diet effective now                 EDUCATION NEEDS:   No education needs have been identified at this time  Skin:  Skin Assessment: Skin Integrity Issues: Other: facial lacerations  Last BM:  PTA  Height:   Ht Readings from Last 1 Encounters:  08/10/20 5\' 7"  (1.702 m)    Weight:   Wt Readings from Last 1 Encounters:  08/10/20 62.6 kg   BMI:  Body mass index is 21.61 kg/m.  Estimated Nutritional Needs:   Kcal:  1400-1600  Protein:  70-80g  Fluid:  1.6L/day  08/12/20, MS, RD, LDN Inpatient Clinical Dietitian Contact information available via Amion

## 2020-08-11 NOTE — NC FL2 (Signed)
[place] MEDICAID FL2 LEVEL OF CARE SCREENING TOOL     IDENTIFICATION  Patient Name: Lisa [name] Birthdate: 1921-11-19 Sex: female Admission Date (Current Location): 08/10/2020  [facility] and [place] Number:  Producer, television/film/video and Address:  [facility],  [address] [place], [place] [address]      Provider Number: [id number]  Attending Physician Name and Address:  Standley Brooking, MD  Relative Name and Phone Number:  daughter, Ezra Sites [phone]    Current Level of Care: Hospital Recommended Level of Care: Skilled Nursing Facility Prior Approval Number:    Date Approved/Denied:   PASRR Number: [id number]  Discharge Plan: SNF    Current Diagnoses: Patient Active Problem List   Diagnosis Date Noted  . Rib fractures 08/10/2020  . Intermediate stage nonexudative age-related macular degeneration of left eye 01/05/2020  . Prolapse of iris in recent wound, left, subsequent encounter 09/16/2019  . Orthostatic syncope 09/04/2019  . Scleral laceration of left eye 09/03/2019  . Fall at home, initial encounter 09/03/2019  . Syncope and collapse 09/03/2019  . Generalized weakness 09/03/2019  . Recurrent UTI 09/03/2019  . Branch retinal vein occlusion with macular edema of right eye 07/07/2019  . Branch retinal vein occlusion with neovascularization of right eye 07/07/2019  . Cystoid macular edema of right eye 07/07/2019  . Exudative age-related macular degeneration of left eye with inactive choroidal neovascularization (HCC) 07/07/2019  . Genitourinary syndrome of menopause 07/01/2019  . Other fatigue 12/03/2017  . Anemia 12/03/2017  . Balance problem 12/03/2017  . Lichen sclerosus of female genitalia 12/03/2017  . Syncope 07/17/2017  . Neck pain, chronic 07/05/2015  . Status post total left knee replacement 07/05/2015  . Primary osteoarthritis of left knee 01/15/2015  . S/P knee replacement 01/15/2015  . Dry skin 01/28/2014  . HLD  (hyperlipidemia) 01/06/2014  . Essential hypertension 01/06/2014  . B12 deficiency 09/29/2013  . Mixed hyperlipidemia 09/29/2013  . Hypertension, renal disease, stage 1-4 or unspecified chronic kidney disease 06/26/2013  . Seasonal allergies 06/26/2013  . CVA (cerebral vascular accident) (HCC) 04/09/2013  . Stroke (HCC) 04/09/2013  . Insomnia 09/14/2012  . Hyperlipidemia   . Osteoarthritis of right knee   . Undiagnosed cardiac murmurs   . Shortness of breath   . Chronic kidney disease, stage III (moderate) (HCC)   . Cervicalgia   . Dyspnea 11/30/2008    Orientation RESPIRATION BLADDER Height & Weight     Self,Time,Situation,Place  Normal Continent Weight: 138 lb (62.6 kg) Height:  5\' 7"  (170.2 cm)  BEHAVIORAL SYMPTOMS/MOOD NEUROLOGICAL BOWEL NUTRITION STATUS      Continent    AMBULATORY STATUS COMMUNICATION OF NEEDS Skin   Limited Assist Verbally Skin abrasions,Other (Comment) (head laceration and legs)                       Personal Care Assistance Level of Assistance  Bathing,Dressing Bathing Assistance: Limited assistance   Dressing Assistance: Limited assistance     Functional Limitations Info             SPECIAL CARE FACTORS FREQUENCY  PT (By licensed PT),OT (By licensed OT)     PT Frequency: 5x/wk OT Frequency: 5x/wk            Contractures      Additional Factors Info  Code Status,Allergies Code Status Info: DNR Allergies Info: see MAR           Current Medications (08/11/2020):  This is the current hospital  active medication list Current Facility-Administered Medications  Medication Dose Route Frequency Provider Last Rate Last Admin  . acetaminophen (TYLENOL) tablet 650 mg  650 mg Oral Q6H PRN Glade Lloyd, MD       Or  . acetaminophen (TYLENOL) suppository 650 mg  650 mg Rectal Q6H PRN Alekh, Kshitiz, MD      . carvedilol (COREG) tablet 6.25 mg  6.25 mg Oral BID WC Hanley Ben, Kshitiz, MD   6.25 mg at 08/11/20 0816  . cholecalciferol  (VITAMIN D3) tablet 400 Units  400 Units Oral Daily Glade Lloyd, MD   400 Units at 08/11/20 0815  . enoxaparin (LOVENOX) injection 30 mg  30 mg Subcutaneous Q24H Poindexter, Leann T, RPH      . feeding supplement (ENSURE ENLIVE / ENSURE PLUS) liquid 237 mL  237 mL Oral BID BM Alekh, Kshitiz, MD   237 mL at 08/11/20 1458  . hydrALAZINE (APRESOLINE) tablet 25 mg  25 mg Oral Q6H PRN Alekh, Kshitiz, MD      . morphine 2 MG/ML injection 2 mg  2 mg Intravenous Once Morelli, Brandon A, PA-C      . multivitamin (PROSIGHT) tablet   Oral BID Glade Lloyd, MD   1 tablet at 08/11/20 0815  . ondansetron (ZOFRAN) tablet 4 mg  4 mg Oral Q6H PRN Glade Lloyd, MD       Or  . ondansetron (ZOFRAN) injection 4 mg  4 mg Intravenous Q6H PRN Alekh, Kshitiz, MD      . polyethylene glycol (MIRALAX / GLYCOLAX) packet 17 g  17 g Oral Daily PRN Alekh, Kshitiz, MD      . senna-docusate (Senokot-S) tablet 1 tablet  1 tablet Oral QHS PRN Hanley Ben, Kshitiz, MD      . traMADol (ULTRAM) tablet 50 mg  50 mg Oral Q6H PRN Chotiner, Claudean Severance, MD   50 mg at 08/11/20 0816  . traZODone (DESYREL) tablet 25 mg  25 mg Oral QHS Glade Lloyd, MD   25 mg at 08/10/20 2108  . vitamin B-12 (CYANOCOBALAMIN) tablet 1,000 mcg  1,000 mcg Oral Daily Glade Lloyd, MD   1,000 mcg at 08/11/20 0815     Discharge Medications: Please see discharge summary for a list of discharge medications.  Relevant Imaging Results:  Relevant Lab Results:   Additional Information    Richerd Grime, LCSW

## 2020-08-11 NOTE — Evaluation (Signed)
Occupational Therapy Evaluation Patient Details Name: Lisa [name] MRN: [id number] DOB: Aug 31, 1921 Today's Date: 08/11/2020    History of Present Illness Pt is a 87 y/o female admitted after sustaining a fall at her ALF during a transfer from EOB to wheelchair.  Rib fractures on RT including ribs 8, 9, 10, 11 and 12 shown on imaging.  Pt also with forehead laceration. PMH including but not limited to chronic UTIs, multiple falls, and stroke.   Clinical Impression   Patient is currently requiring assistance with ADLs including moderate to maximum assist with toileting, bed level LE dressing, and with lower body bathing, as well as minimal assist with UE dressing and full setup/supervision with EOB grooming, all of which may be below patient's typical baseline however pt does endorse global need of assistance at baseline due to frequent falls, dizziness when upright, and Bil hand numbness.  During this evaluation, patient was further limited by very unsteady dynamic standing balance with pt's feet stepping on each other and need of Max assist to lower to drop arm chair for safety as well as above, which has the potential to impact patient's safety and independence during functional mobility, as well as performance for ADLs. [employer] AM-PAC "6-clicks" Daily Activity Inpatient Short Form score of 16/24 indicates 53.32% ADL impairment this session. Patient lives at her Assisted Living facility, where she receives 24/7 supervision and assistance.  Patient demonstrates fair rehab potential, and should benefit from continued skilled occupational therapy services while in acute care to maximize safety, independence and quality of life at home.  Continued occupational therapy services in her ALF vs SNF setting prior to return home is recommended.  ?   Follow Up Recommendations  SNF;Supervision/Assistance - 24 hour (Versus home health PT and OT at pt's ALF which it sounds like she prefers.)     Equipment Recommendations       Recommendations for Other Services       Precautions / Restrictions Precautions Precautions: Fall Restrictions Weight Bearing Restrictions: No      Mobility Bed Mobility Overal bed mobility: Needs Assistance Bed Mobility: Supine to Sit     Supine to sit: HOB elevated;Min assist          Transfers Overall transfer level: Needs assistance   Transfers: Sit to/from Stand;Stand Pivot Transfers Sit to Stand: Min assist Stand pivot transfers: Max assist       General transfer comment: Pt stood from EOB with Min As to RW. Cues for hand placement. Stand pivot with pt very unsteady and repeatedly stepping on foot on top of other. Required Max As lower onto drop-arm recliner for safety.    Balance Overall balance assessment: Needs assistance   Sitting balance-Leahy Scale: Fair Sitting balance - Comments: report of dizziness.     Standing balance-Leahy Scale: Poor Standing balance comment: Very poor dynamic with BUE support on RW.                           ADL either performed or assessed with clinical judgement   ADL Overall ADL's : Needs assistance/impaired Eating/Feeding: Independent   Grooming: Set up;Sitting;Wash/dry hands   Upper Body Bathing: Sitting;Min guard   Lower Body Bathing: Maximal assistance;Sitting/lateral leans   Upper Body Dressing : Minimal assistance;Sitting Upper Body Dressing Details (indicate cue type and reason): Increased assistance for fasteners due to hand numbness. Lower Body Dressing: Bed level;Maximal assistance   Toilet Transfer: Maximal assistance;Stand-pivot;RW Toilet Transfer Details (indicate  cue type and reason): Transfer to recliner. Pt with pure wick in place. Toileting- Clothing Manipulation and Hygiene: Maximal assistance Toileting - Clothing Manipulation Details (indicate cue type and reason): Pt endorsed urinary urgency and use of pull up brief at baseline. Pt now on pure wick  Anticipate at least Max As for hygiene and clothing management.     Functional mobility during ADLs: Minimal assistance;Maximal assistance;Rolling walker       Vision   Vision Assessment?: No apparent visual deficits Additional Comments: Laceration near eye not seeming to affect vision.     Perception     Praxis      Pertinent Vitals/Pain Pain Assessment: 0-10 Pain Score: 2  Pain Location: LT UE.  Pt denied rib pain and reports that she believes the rib fractures are from an earlier fall and not this one. Pain Intervention(s): Limited activity within patient's tolerance;Monitored during session;Premedicated before session;Repositioned     Hand Dominance Right   Extremity/Trunk Assessment Upper Extremity Assessment Upper Extremity Assessment: RUE deficits/detail;LUE deficits/detail;Generalized weakness RUE Sensation: decreased light touch LUE Sensation: decreased light touch   Lower Extremity Assessment Lower Extremity Assessment: Defer to PT evaluation   Cervical / Trunk Assessment Cervical / Trunk Assessment: Normal   Communication Communication Communication: No difficulties   Cognition Arousal/Alertness: Awake/alert Behavior During Therapy: WFL for tasks assessed/performed Overall Cognitive Status: Within Functional Limits for tasks assessed                                 General Comments: A&Ox4   General Comments       Exercises     Shoulder Instructions      Home Living Family/patient expects to be discharged to:: Assisted living                             Home Equipment: Shower seat;Grab bars - tub/shower;Cane - single point;Wheelchair - manual          Prior Functioning/Environment Level of Independence: Needs assistance  Gait / Transfers Assistance Needed: Pt reports that she no longer walks, and has been working with home health PT on standing. Pt reports standing up to 1 minute with PT. Pt uses WC for all mobility  and reports chronic dizziness when upright which causes frequent falls. ADL's / Homemaking Assistance Needed: Pt reports being assisted with "everything" due to bilateral hand numbness/tingling and being at [facility] level.            OT Problem List: Impaired sensation;Pain;Decreased strength;Decreased activity tolerance;Impaired balance (sitting and/or standing)      OT Treatment/Interventions: Self-care/ADL training;Therapeutic exercise;Therapeutic activities;Patient/family education;DME and/or AE instruction;Balance training    OT Goals(Current goals can be found in the care plan section) Acute Rehab OT Goals Patient Stated Goal: Walk OT Goal Formulation: With patient Time For Goal Achievement: 08/25/20 Potential to Achieve Goals: Fair ADL Goals Pt Will Perform Lower Body Bathing: bed level;sitting/lateral leans;with caregiver independent in assisting Pt Will Perform Lower Body Dressing: bed level;with min assist;with caregiver independent in assisting Pt Will Transfer to Toilet: with min assist;stand pivot transfer;squat pivot transfer Pt Will Perform Toileting - Clothing Manipulation and hygiene: with min guard assist;sitting/lateral leans;sit to/from stand;with caregiver independent in assisting Pt/caregiver will Perform Home Exercise Program: Both right and left upper extremity;Increased strength;Independently (Sitting EOB) Additional ADL Goal #1: Patient will be independent in verbalizing/educating caregivers on use of gait belt and  how to safely assist pt to [facility] and WC in order to prevent future falls at ALF.  OT Frequency: Min 2X/week   Barriers to D/C:            Co-evaluation              AM-PAC OT "6 Clicks" Daily Activity     Outcome Measure Help from another person eating meals?: None Help from another person taking care of personal grooming?: A Little Help from another person toileting, which includes using toliet, bedpan, or urinal?: A Lot Help from another  person bathing (including washing, rinsing, drying)?: A Lot Help from another person to put on and taking off regular upper body clothing?: A Little Help from another person to put on and taking off regular lower body clothing?: A Lot 6 Click Score: 16   End of Session Equipment Utilized During Treatment: Gait belt;Rolling walker Nurse Communication: Mobility status  Activity Tolerance: Patient tolerated treatment well Patient left: in chair;with call bell/phone within reach;with chair alarm set  OT Visit Diagnosis: Unsteadiness on feet (R26.81);Repeated falls (R29.6);History of falling (Z91.81);Pain;Muscle weakness (generalized) (M62.81);Ataxia, unspecified (R27.0) Pain - Right/Left: Left Pain - part of body: Shoulder;Arm                Time: 9323-5573 OT Time Calculation (min): 27 min Charges:  OT General Charges $OT Visit: 1 Visit OT Evaluation $OT Eval Low Complexity: 1 Low OT Treatments $Therapeutic Activity: 8-22 mins  Victorino Dike, OT Acute Rehab Services Office: [phone] 08/11/2020  Theodoro Clock 08/11/2020, 10:54 AM

## 2020-08-11 NOTE — Evaluation (Signed)
Physical Therapy Evaluation Patient Details Name: Lisa [name] MRN: [id number] DOB: [birth year] Today's Date: 08/11/2020   History of Present Illness  Pt is a 87 y/o female admitted after sustaining a fall at her ALF during a transfer from EOB to wheelchair on 08/10/20.  Rib fractures on RT including ribs 8, 9, 10, 11 and 12 shown on imaging.  Pt also with forehead laceration. PMH including but not limited to chronic UTIs, multiple falls, and stroke. Pt reports long standing hx of orthostatic hypotension  Clinical Impression   Pt admitted with above diagnosis. Pt from ALF with recent hx of frequent falls.  She does not ambulate but is able to transfer to a w/c to mobilize.  Pt also has history of long standing orthostatic hypotension that contributes to fall.  Pt is below her baseline requiring mod A for transfers today and with frequent falls at ALF; due to this do recommend SNF.  Also, encouraged pt and daughter to consider possible long term transition to a higher level of care if she is not getting level of assist needed at ALF.  Pt currently with functional limitations due to the deficits listed below (see PT Problem List). Pt will benefit from skilled PT to increase their independence and safety with mobility to allow discharge to the venue listed below.       Follow Up Recommendations SNF    Equipment Recommendations  None recommended by PT    Recommendations for Other Services       Precautions / Restrictions Precautions Precautions: Fall Precaution Comments: orthostatic hypotension Restrictions Weight Bearing Restrictions: No      Mobility  Bed Mobility Overal bed mobility: Needs Assistance Bed Mobility: Rolling;Sidelying to Sit;Sit to Sidelying Rolling: Supervision (use of rails) Sidelying to sit: Mod assist;HOB elevated Supine to sit: HOB elevated;Min assist   Sit to sidelying: Min assist;HOB elevated General bed mobility comments: Mod A to lift trunk to sit; min A  for legs back to bed    Transfers Overall transfer level: Needs assistance Equipment used: Rolling walker (2 wheeled);None Transfers: Sit to/from [employer] to Stand: Min assist Stand pivot transfers: Mod assist       General transfer comment: Pt initially attempted parital stand for stand pivot with use of bed rail/bsc rail for support as she does at facility but was unable.  Pt then stood with min A and RW and pivoted to [facility] with mod A to steady. Used restroom requiring total assist with ADLs and the pivoted back to bed.  Stood again and pivoted toward [facility].  Ambulation/Gait             General Gait Details: non ambulatory  Stairs            Wheelchair Mobility    Modified Rankin (Stroke Patients Only)       Balance Overall balance assessment: Needs assistance   Sitting balance-Leahy Scale: Fair Sitting balance - Comments: could sit without UE but preferred UE support   Standing balance support: Bilateral upper extremity supported Standing balance-Leahy Scale: Poor Standing balance comment: REquiring RW  and min A static and mod A tx.  In static standing pt with anterior/posterior swaying and only able to tolerate ~ 30 seconds before getting lightheaded                             Pertinent Vitals/Pain Pain Assessment: No/denies pain  Pain Location:   Pt  denied rib pain and reports that she believes the rib fractures are from an earlier fall and not this one. Pain Intervention(s): Limited activity within patient's tolerance;Monitored during session;Premedicated before session;Repositioned    Home Living Family/patient expects to be discharged to:: Unsure (Pt is from [facility])               Home Equipment: Shower seat;Grab bars - tub/shower;Cane - single point;Wheelchair - manual      Prior Function Level of Independence: Needs assistance   Gait / Transfers Assistance Needed: Pt reports that she no longer walks,  and has been working with home health PT on standing. Pt reports standing up to 1 minute with PT. Pt uses WC for all mobility and reports chronic dizziness (orthostatic hypotension) when upright which causes frequent falls.  ADL's / Homemaking Assistance Needed: Pt reports being assisted with "everything" due to bilateral hand numbness/tingling and being at [facility] level.        Hand Dominance   Dominant Hand: Right    Extremity/Trunk Assessment   Upper Extremity Assessment Upper Extremity Assessment: Defer to OT evaluation     Lower Extremity Assessment Lower Extremity Assessment: LLE deficits/detail;RLE deficits/detail RLE Deficits / Details: ROM WFL; MMT 4+/5 LLE Deficits / Details: ROM WFL; MMT 4+/5    Cervical / Trunk Assessment Cervical / Trunk Assessment: Normal  Communication   Communication: No difficulties  Cognition Arousal/Alertness: Awake/alert Behavior During Therapy: WFL for tasks assessed/performed Overall Cognitive Status: Within Functional Limits for tasks assessed                                 General Comments: A&Ox4 just HOH and needs repetition at times; daughter present      General Comments General comments (skin integrity, edema, etc.): Daughter present and reports pt with long standing hx (years) of orthostatic hypotension. Reports that when OT saw pt earlier her pressure went up with standing and transfers, which is unusual for pt.  Daughter asking about checking BP again after pt had pivoted to commode and back to bed. Sitting EOB BP was 141/76, stood and pt became dizzy before BP could finish completed at EOB and was [id number] (question validity of reading due to pt's tension/movment).  Rechecked in sitting with pt relaxed and BP 111/58 and returned to supine was 157/76.  Discussed that orthostatic hypotension (along with weakness and neuropathy) could be contributing to falls even if pt isn't "getting all the way syncopal" could still make her  weak.  Discussed SNF vs return to ALF with pt and daughter.  Pt does not appear to be getting level of care she needs at ALF due to frequent falls also is potentially slightly below baseline.  REcommended SNF to try to increase safety with transfers and mobility, but encouraged family to start thinking about possible transition to higher level of care long term as some of pt's issues have been long standing (10 falls 20months per chart)    Exercises     Assessment/Plan    PT Assessment Patient needs continued PT services  PT Problem List Decreased strength;Decreased mobility;Decreased safety awareness;Decreased activity tolerance;Cardiopulmonary status limiting activity;Decreased balance;Decreased knowledge of use of DME       PT Treatment Interventions DME instruction;Therapeutic activities;Gait training;Therapeutic exercise;Patient/family education;Functional mobility training;Balance training    PT Goals (Current goals can be found in the Care Plan section)  Acute Rehab PT Goals Patient Stated Goal: decrease falls  PT Goal Formulation: With patient/family Time For Goal Achievement: 08/25/20 Potential to Achieve Goals: Fair    Frequency Min 2X/week   Barriers to discharge        Co-evaluation               AM-PAC PT "6 Clicks" Mobility  Outcome Measure Help needed turning from your back to your side while in a flat bed without using bedrails?: A Little Help needed moving from lying on your back to sitting on the side of a flat bed without using bedrails?: A Lot Help needed moving to and from a bed to a chair (including a wheelchair)?: A Lot Help needed standing up from a chair using your arms (e.g., wheelchair or bedside chair)?: A Little Help needed to walk in hospital room?: Total Help needed climbing 3-5 steps with a railing? : Total 6 Click Score: 12    End of Session Equipment Utilized During Treatment: Gait belt Activity Tolerance: Patient tolerated treatment  well Patient left: in bed;with call bell/phone within reach;with bed alarm set;with family/visitor present Nurse Communication: Mobility status PT Visit Diagnosis: Unsteadiness on feet (R26.81);History of falling (Z91.81);Muscle weakness (generalized) (M62.81)    Time: 3212-2482 PT Time Calculation (min) (ACUTE ONLY): 27 min   Charges:   PT Evaluation $PT Eval Moderate Complexity: 1 Mod PT Treatments $Therapeutic Activity: 8-22 mins        Anise Salvo, PT Acute Rehab Services Pager [phone] [facility] [phone]    Rayetta Humphrey 08/11/2020, 1:44 PM

## 2020-08-11 NOTE — Progress Notes (Signed)
PROGRESS NOTE  Lisa [name] JQB:[id number] DOB: March 27, 1921 DOA: 08/10/2020 PCP: Frederica Kuster, MD  Brief History    87 year old woman from [place] presented after a fall when transferring from bed to wheelchair.  Work-up revealed multiple right-sided rib fractures and patient was seen by general surgery and admitted for further evaluation and monitoring.  A & P  Mechanical fall with multiple right-sided rib fractures.  Imaging CT head, neck and maxillofacial without acute abnormalities.  CT chest confirmed rib fractures.  CT abdomen pelvis otherwise unremarkable.  Left humeral film negative, pelvis films negative, left shoulder film negative. -- Appreciate general surgery, chest x-ray without evidence of complicating features, no pneumothorax.  Continue multimodal pain control and pulmonary toilet. --Frequent falls at home by report.  Forehead laceration, hematoma status post Dermabond 5/24, multiple lower extremity abrasions --Local wound care  Left upper extremity pain --X-ray negative for fracture.  Possible bone contusion.  Pain control, ice, heat.  CKD stage IIIa -- Stable.  Multiple small pulmonary nodules. No follow-up needed if patient is low-risk (and has no known or suspected primary neoplasm). Non-contrast chest CT can be considered in 12 months if patient is high-risk.   Aortic Atherosclerosis (ICD10-I70.0).  Nutritional Assessment: Body mass index is 21.61 kg/m.Marland Kitchen Seen by dietician.  I agree with the assessment and plan as outlined below: Nutrition Status: Nutrition Problem: Increased nutrient needs Etiology:  (acute injuries) Signs/Symptoms: estimated needs Interventions: Ensure Enlive (each supplement provides 350kcal and 20 grams of protein),MVI  Disposition Plan:  Discussion: Seems to be stabilizing.  Plan for SNF.  Discussed with utilization management.  Change to inpatient.  Dispo: The patient is from: ALF              Anticipated d/c is to:  SNF              Patient currently is not medically stable to d/c.   Difficult to place patient No  DVT prophylaxis: enoxaparin (LOVENOX) injection 30 mg Start: 08/11/20 1800   Code Status: DNR Level of care: Med-Surg Family Communication: daughter at bedside  Brendia Sacks, MD  [facility] Direct contact: see [email or web address] (further directions at bottom of note if needed) 7PM-7AM contact night coverage as at bottom of note 08/11/2020, 7:05 PM  LOS: 0 days   Significant Hospital Events   .    Consults:  .    Procedures:  .   Significant Diagnostic Tests:  Marland Kitchen    Micro Data:  .    Antimicrobials:  .   Interval History/Subjective  CC: f/u fall  No rib pain Some left shoulder pain earlier, none now Breathing ok Multiple falls prior to admission  Objective   Vitals:  Vitals:   08/11/20 0532 08/11/20 1410  BP: (!) 175/91 126/61  Pulse: 68 77  Resp: 16 18  Temp: 98.9 F (37.2 C) 98 F (36.7 C)  SpO2: 98% 97%    Exam:  Constitutional:   . Appears calm and comfortable ENMT:  . grossly normal hearing  Respiratory:  . CTA bilaterally, no w/r/r.  . Respiratory effort normal.  Cardiovascular:  . RRR, no m/r/g . No LE extremity edema   Musculoskeletal:  . Left arm nontender to palpation. Moves arm w/o difficulty, able to touch head Psychiatric:  . Mental status o Mood, affect appropriate  I have personally reviewed the following:   Today's Data  . CMP and CBC stable  Scheduled Meds: . carvedilol  6.25 mg Oral BID WC  . cholecalciferol  400 Units Oral Daily  . enoxaparin (LOVENOX) injection  30 mg Subcutaneous Q24H  . feeding supplement  237 mL Oral BID BM  .  morphine injection  2 mg Intravenous Once  . multivitamin   Oral BID  . traZODone  25 mg Oral QHS  . vitamin B-12  1,000 mcg Oral Daily   Continuous Infusions:  Principal Problem:   Rib fractures Active Problems:   Chronic kidney disease, stage III (moderate) (HCC)   Fall at home,  initial encounter   Aortic atherosclerosis (HCC)   LOS: 0 days   How to contact the [facility] Attending or Consulting provider 7A - 7P or covering provider during after hours 7P -7A, for this patient?  1. Check the care team in [facility] and look for a) attending/consulting [facility] provider listed and b) the [facility] team listed 2. Log into [email or web address] and use [place]'s universal password to access. If you do not have the password, please contact the hospital operator. 3. Locate the [facility] provider you are looking for under [facility] and page to a number that you can be directly reached. 4. If you still have difficulty reaching the provider, please page the [facility] (Director on Call) for the Hospitalists listed on amion for assistance.

## 2020-08-12 DIAGNOSIS — S2241XA Multiple fractures of ribs, right side, initial encounter for closed fracture: Secondary | ICD-10-CM | POA: Diagnosis not present

## 2020-08-12 MED ORDER — BACITRACIN 500 UNIT/GM EX OINT: TOPICAL_OINTMENT | Freq: Every day | CUTANEOUS | 0 refills | Status: AC

## 2020-08-12 MED ORDER — TRAMADOL HCL 50 MG PO TABS: 50.0000 mg | ORAL_TABLET | Freq: Two times a day (BID) | ORAL | 0 refills | Status: AC | PRN

## 2020-08-12 MED ORDER — BACITRACIN 500 UNIT/GM EX OINT
TOPICAL_OINTMENT | Freq: Every day | CUTANEOUS | Status: DC
  Administered 2020-08-12: 1 via TOPICAL
  Filled 2020-08-12: qty 14

## 2020-08-12 MED ORDER — ACETAMINOPHEN 325 MG PO TABS: 650.0000 mg | ORAL_TABLET | Freq: Four times a day (QID) | ORAL | Status: AC | PRN

## 2020-08-12 NOTE — Plan of Care (Signed)
  Problem: Coping: Goal: Level of anxiety will decrease Outcome: Progressing   Problem: Pain Managment: Goal: General experience of comfort will improve Outcome: Progressing   Problem: Safety: Goal: Ability to remain free from injury will improve Outcome: Progressing   

## 2020-08-12 NOTE — Discharge Summary (Signed)
Physician Discharge Summary  Lisa SouKathleen N Rumbold ZOX:[id number] DOB: 07/20/1921 DOA: 08/10/2020  PCP: Frederica KusterMiller, Stephen M, MD  Admit date: 08/10/2020 Discharge date: 08/12/2020  Recommendations for Outpatient Follow-up:   Mechanical fall with multiple right-sided rib fractures.   --Continue multimodal pain control and pulmonary toilet. -- Cleared by surgery for discharge, can follow-up with PCP, no need to follow-up with trauma clinic.  Forehead laceration, hematoma status post Dermabond 5/24, multiple lower extremity abrasions --Local wound care, bandage removed today by surgery, glue lost but local wound care only needed.  Multiple small pulmonary nodules. No follow-up needed if patient is low-risk (and has no known or suspected primary neoplasm). Non-contrast chest CT can be considered in 12 months if patient is high-risk.   Reported bilateral hand numbness for the last several months.  She has close follow-up with Drs. Shirlean SchleinKendall, Ramos and McClearyOrtman.  Could consider neck imaging as an outpatient but will defer to her primary orthopedist.  Aortic Atherosclerosis (ICD10-I70.0).   Contact information for follow-up providers    Frederica KusterMiller, Stephen M, MD. Schedule an appointment as soon as possible for a visit in 2 day(s).   Specialties: Family Medicine, Emergency Medicine Contact information: [address] [place] [address] [phone]            Contact information for after-discharge care    Destination    [facility] .   Service: Skilled Nursing Contact information: [address] [place] [id number] [phone]                   Discharge Diagnoses: Principal diagnosis is #1 Principal Problem:   Rib fractures Active Problems:   Chronic kidney disease, stage III (moderate) (HCC)   Fall at home, initial encounter   Aortic atherosclerosis [facility](HCC)   Discharge Condition: improved Disposition: SNF  Diet recommendation:  Diet Orders (From  admission, onward)    Start     Ordered   08/12/20 0000  Diet - low sodium heart healthy        08/12/20 1108   08/10/20 1300  Diet Heart Room service appropriate? Yes; Fluid consistency: Thin  Diet effective now       Question Answer Comment  Room service appropriate? Yes   Fluid consistency: Thin      08/10/20 1302           Filed Weights   08/10/20 0806  Weight: 62.6 kg    HPI/Hospital Course:   87 year [place] after a fall when transferring from bed to wheelchair.  Work-up revealed multiple right-sided rib fractures and patient was seen by general surgery and admitted for further evaluation and monitoring.  Patient did well with conservative management, no respiratory symptoms.  Today feeling well and without complaint.  No hypoxia.  Seen by PT and OT with recommendation for SNF which daughter accepted.  Plan for transfer to rehab today.  Mechanical fall with multiple right-sided rib fractures.  Imaging CT head, neck and maxillofacial without acute abnormalities.  CT chest confirmed rib fractures.  CT abdomen pelvis otherwise unremarkable.  Left humeral film negative, pelvis films negative, left shoulder film negative. -- Appreciate general surgery, chest x-ray without evidence of complicating features, no pneumothorax.  Continue multimodal pain control and pulmonary toilet. --Frequent falls at home by report. -- Cleared by surgery for discharge, can follow-up with PCP, no need to follow-up with trauma clinic.  Forehead laceration, hematoma status post Dermabond 5/24, multiple lower extremity abrasions --Local wound care,  bandage removed today by surgery, glue lost but local wound care only needed.  Left upper extremity pain --X-ray negative for fracture.  Resolved.  CKD stage IIIa -- Stable.  Multiple small pulmonary nodules. No follow-up needed if patient is low-risk (and has no known or suspected primary neoplasm). Non-contrast chest CT can be  considered in 12 months if patient is high-risk.   Aortic Atherosclerosis (ICD10-I70.0).   Consults:  . Trauma/general surgery  Today's assessment: S: CC: f/u fall  Feels good, no pain today in chest or left shoulder.  Breathing fine  O: Vitals:  Vitals:   08/11/20 2039 08/12/20 0519  BP: (!) 155/75 (!) 142/70  Pulse: 68 64  Resp: 16 15  Temp: 98 F (36.7 C) 98.2 F (36.8 C)  SpO2: 97% 98%    Constitutional:  . Appears calm and comfortable ENMT:  . grossly normal hearing  Respiratory:  . CTA bilaterally, no w/r/r.  . Respiratory effort normal.  Cardiovascular:  . RRR, no m/r/g . No LE extremity edema   Musculoskeletal:  . Moves left shoulder Psychiatric:  . judgement and insight appear normal . Mental status o Mood, affect appropriate  No new data  Discharge Instructions  Discharge Instructions    Diet - low sodium heart healthy   Complete by: As directed    Discharge wound care:   Complete by: As directed    Apply nonstick dressing followed by gauze to forehead and lower extremity abrasions     Allergies as of 08/12/2020      Reactions   Ace Inhibitors Cough   Lisinopril Cough   Statins Other (See Comments)   Body aches and pains      Medication List    STOP taking these medications   Acetaminophen 500 MG capsule Replaced by: acetaminophen 325 MG tablet   Gemtesa 75 MG Tabs Generic drug: Vibegron   traZODone 50 MG tablet Commonly known as: DESYREL     TAKE these medications   acetaminophen 325 MG tablet Commonly known as: TYLENOL Take 2 tablets (650 mg total) by mouth every 6 (six) hours as needed for mild pain (or Fever >/= 101). Replaces: Acetaminophen 500 MG capsule   b complex vitamins capsule Take 1 capsule by mouth daily.   bacitracin 500 UNIT/GM ointment Apply topically daily.   carvedilol 6.25 MG tablet Commonly known as: COREG Take 1/2 tablet by mouth twice daily What changed:   how much to take  how to take  this  when to take this  additional instructions   Cyanocobalamin 1000 MCG Caps Take 1,000 mcg by mouth daily.   estradiol 0.1 MG/GM vaginal cream Commonly known as: ESTRACE Place 1 Applicatorful vaginally every 14 (fourteen) days.   gabapentin 100 MG capsule Commonly known as: NEURONTIN Take 100 mg by mouth 3 (three) times daily. For Hand pain   Lidocaine HCl 200 MG/10ML Sosy Place 25 mLs vaginally every 12 (twelve) hours as needed (for atrophy).   mometasone 0.1 % ointment Commonly known as: ELOCON Apply 1 application topically 2 (two) times daily. To vaginal area   ondansetron 4 MG tablet Commonly known as: ZOFRAN Take 4 mg by mouth every 6 (six) hours as needed for nausea or vomiting.   phenazopyridine 95 MG tablet Commonly known as: PYRIDIUM Take 95 mg by mouth daily. AZO- Cranberry - Vit C, 250-30 mg   polyethylene glycol 17 g packet Commonly known as: MIRALAX / GLYCOLAX Take 17 g by mouth daily as needed for mild constipation.  PreserVision AREDS 2+Multi Vit Caps Take 1 capsule by mouth 2 (two) times daily.   pyridoxine 100 MG tablet Commonly known as: B-6 Take 100 mg by mouth every 8 (eight) hours as needed (burning upon urination).   traMADol 50 MG tablet Commonly known as: ULTRAM Take 1 tablet (50 mg total) by mouth 2 (two) times daily as needed for moderate pain or severe pain. What changed: reasons to take this   Vitamin D3 10 MCG (400 UNIT) Caps Take 400 Units by mouth daily.            Discharge Care Instructions  (From admission, onward)         Start     Ordered   08/12/20 0000  Discharge wound care:       Comments: Apply nonstick dressing followed by gauze to forehead and lower extremity abrasions   08/12/20 1108         Allergies  Allergen Reactions  . Ace Inhibitors Cough  . Lisinopril Cough  . Statins Other (See Comments)    Body aches and pains    The results of significant diagnostics from this hospitalization  (including imaging, microbiology, ancillary and laboratory) are listed below for reference.    Significant Diagnostic Studies: CT ABDOMEN PELVIS WO CONTRAST  Result Date: 08/10/2020 CLINICAL DATA:  Fall with rib fractures by x-ray. EXAM: CT ABDOMEN AND PELVIS WITHOUT CONTRAST TECHNIQUE: Multidetector CT imaging of the abdomen and pelvis was performed following the standard protocol without IV contrast. COMPARISON:  09/02/2019 FINDINGS: Lower chest:  No contributory findings. Hepatobiliary: No focal liver abnormality.Cholelithiasis. Interval clearance of CBD high-density. Pancreas: Generalized atrophy Spleen: Unremarkable. Adrenals/Urinary Tract: Negative adrenals. No hydronephrosis or stone. Bilateral renal cystic densities. Full urinary bladder which may account for pericystic fat stranding. No internal or mural high-density to implicate a traumatic cause. Stomach/Bowel:  No obstruction. No appendicitis. Vascular/Lymphatic: No acute vascular abnormality. Diffuse atheromatous calcification of the aorta and iliacs. Dilated infrarenal aorta measuring up to 2.6 cm in diameter. No mass or adenopathy. Reproductive:Unremarkable for age Other: No ascites or pneumoperitoneum. Musculoskeletal: Right posterior rib fractures involving 9, 10, 11, and 12. On the prior chest CT the right 8th rib was also involved. Ordinary spinal degeneration. Generalized osteopenia. IMPRESSION: 1. Known right-sided rib fractures involving 8 through 12. 2. Distended urinary bladder which could account for pericystic fat edema. Please correlate for cystitis. 3. Chronic findings are stable from 2021 and described above. Electronically Signed   By: Marnee Spring M.D.   On: 08/10/2020 11:57   DG Pelvis 1-2 Views  Result Date: 08/10/2020 CLINICAL DATA:  Pelvic pain after fall. EXAM: PELVIS - 1-2 VIEW COMPARISON:  None. FINDINGS: There is no evidence of pelvic fracture or diastasis. No pelvic bone lesions are seen. IMPRESSION: Negative.  Electronically Signed   By: Lupita Raider M.D.   On: 08/10/2020 12:07   CT Head Wo Contrast  Result Date: 08/10/2020 CLINICAL DATA:  Facial trauma, minor. EXAM: CT HEAD WITHOUT CONTRAST CT MAXILLOFACIAL WITHOUT CONTRAST CT CERVICAL SPINE WITHOUT CONTRAST TECHNIQUE: Multidetector CT imaging of the head, cervical spine, and maxillofacial structures were performed using the standard protocol without intravenous contrast. Multiplanar CT image reconstructions of the cervical spine and maxillofacial structures were also generated. COMPARISON:  07/03/2020 FINDINGS: CT HEAD FINDINGS Brain: No evidence of acute infarction, hemorrhage, hydrocephalus, extra-axial collection or mass lesion/mass effect. Cerebral volume loss in keeping with aging. Small-vessel ischemic gliosis to a mild degree with chronic lacune at the left corona radiata.  Vascular: No hyperdense vessel or unexpected calcification. Skull: Hematoma along the left forehead.  No calvarial fracture. CT MAXILLOFACIAL FINDINGS Osseous: Negative for fracture or mandibular dislocation. Orbits: No acute finding. Bowing of the medial wall on the left which may be from old fracture. Sinuses: Essentially complete opacification of the right maxillary sinus with internal frothy secretions noted. This opacification has progressed from a March 2022 comparison. Soft tissues: Left forehead contusion. CT CERVICAL SPINE FINDINGS Alignment: No traumatic malalignment Skull base and vertebrae: C4-5 mild anterolisthesis. Soft tissues and spinal canal: No prevertebral fluid or swelling. No visible canal hematoma. Disc levels: Multilevel degenerative disc narrowing and ridging. Generalized and occasionally bulky degenerative facet spurring. Facet ankylosis has occurred at C3-4. Multilevel foraminal impingement. Upper chest: No visible injury IMPRESSION: 1. No evidence of acute intracranial or cervical spine injury. 2. Forehead hematoma.  Negative for facial fracture. 3. Active  right maxillary sinusitis with progression from March 2022. Electronically Signed   By: Marnee Spring M.D.   On: 08/10/2020 09:32   CT Chest Wo Contrast  Result Date: 08/10/2020 CLINICAL DATA:  Recent fall with possible pneumothorax on chest x-ray, initial encounter EXAM: CT CHEST WITHOUT CONTRAST TECHNIQUE: Multidetector CT imaging of the chest was performed following the standard protocol without IV contrast. COMPARISON:  CT from 04/10/2020, chest x-ray from earlier in the same day FINDINGS: Cardiovascular: Limited due to lack of IV contrast. Atherosclerotic calcifications of the thoracic aorta are noted without aneurysmal dilatation. Coronary calcifications are seen. Cardiomegaly is noted. Mediastinum/Nodes: The esophagus as visualized is within normal limits. The thoracic inlet shows no focal abnormality. No sizable hilar or mediastinal adenopathy is noted. Lungs/Pleura: The lungs are well aerated bilaterally. Minimal right-sided pleural effusion is noted. A few scattered small sub pleural nodules are noted. The largest of these is seen on image number 39 of series 4 measuring 4 mm in dimension. This is stable from the previous exam. No other sizable nodules are noted. No definitive pneumothorax is seen. Upper Abdomen: Visualized upper abdomen shows no acute abnormality. Musculoskeletal: Degenerative changes of the thoracic spine are noted. Multiple rib fractures are noted on the right involving the eighth through eleventh ribs posterolaterally. No significant displacement is noted. No definitive rib fractures are noted on the left. IMPRESSION: Multiple right rib fractures as described above. Tiny effusion is noted in a compensatory fashion. No definitive pneumothorax is noted on this exam. Multiple small pulmonary nodules as described. No follow-up needed if patient is low-risk (and has no known or suspected primary neoplasm). Non-contrast chest CT can be considered in 12 months if patient is high-risk.  This recommendation follows the consensus statement: Guidelines for Management of Incidental Pulmonary Nodules Detected on CT Images: From the Fleischner Society 2017; Radiology 2017; [phone] Aortic Atherosclerosis (ICD10-I70.0). Electronically Signed   By: Alcide Clever M.D.   On: 08/10/2020 09:36   CT Cervical Spine Wo Contrast  Result Date: 08/10/2020 CLINICAL DATA:  Facial trauma, minor. EXAM: CT HEAD WITHOUT CONTRAST CT MAXILLOFACIAL WITHOUT CONTRAST CT CERVICAL SPINE WITHOUT CONTRAST TECHNIQUE: Multidetector CT imaging of the head, cervical spine, and maxillofacial structures were performed using the standard protocol without intravenous contrast. Multiplanar CT image reconstructions of the cervical spine and maxillofacial structures were also generated. COMPARISON:  07/03/2020 FINDINGS: CT HEAD FINDINGS Brain: No evidence of acute infarction, hemorrhage, hydrocephalus, extra-axial collection or mass lesion/mass effect. Cerebral volume loss in keeping with aging. Small-vessel ischemic gliosis to a mild degree with chronic lacune at the left corona radiata. Vascular: No  hyperdense vessel or unexpected calcification. Skull: Hematoma along the left forehead.  No calvarial fracture. CT MAXILLOFACIAL FINDINGS Osseous: Negative for fracture or mandibular dislocation. Orbits: No acute finding. Bowing of the medial wall on the left which may be from old fracture. Sinuses: Essentially complete opacification of the right maxillary sinus with internal frothy secretions noted. This opacification has progressed from a March 2022 comparison. Soft tissues: Left forehead contusion. CT CERVICAL SPINE FINDINGS Alignment: No traumatic malalignment Skull base and vertebrae: C4-5 mild anterolisthesis. Soft tissues and spinal canal: No prevertebral fluid or swelling. No visible canal hematoma. Disc levels: Multilevel degenerative disc narrowing and ridging. Generalized and occasionally bulky degenerative facet spurring.  Facet ankylosis has occurred at C3-4. Multilevel foraminal impingement. Upper chest: No visible injury IMPRESSION: 1. No evidence of acute intracranial or cervical spine injury. 2. Forehead hematoma.  Negative for facial fracture. 3. Active right maxillary sinusitis with progression from March 2022. Electronically Signed   By: Marnee Spring M.D.   On: 08/10/2020 09:32   DG CHEST PORT 1 VIEW  Result Date: 08/11/2020 CLINICAL DATA:  Rib fractures of right ribs. EXAM: PORTABLE CHEST 1 VIEW COMPARISON:  Chest x-ray 08/10/2020, CT chest 08/10/2020 FINDINGS: The heart size and mediastinal contours are unchanged. Aortic calcification. No focal consolidation. No pulmonary edema. Question trace pleural fluid at the right apex. No left pleural effusion. No pneumothorax. No acute osseous abnormality. Known rib fractures not well visualized. IMPRESSION: Question trace pleural fluid at the right apex. Electronically Signed   By: Tish Frederickson M.D.   On: 08/11/2020 05:30   DG Chest Portable 1 View  Result Date: 08/10/2020 CLINICAL DATA:  Fall. EXAM: PORTABLE CHEST 1 VIEW COMPARISON:  Chest x-ray 01/04/2019. FINDINGS: Mediastinum and hilar structures normal. Cardiomegaly, no pulmonary venous congestion. No focal infiltrate. No pleural effusion. Prominent skin fold versus small right pneumothorax. Repeat PA lateral chest x-ray suggested for further evaluation. No displaced rib fracture identified. IMPRESSION: Prominent skin fold versus small right-sided pneumothorax. Repeat PA and lateral chest x-ray suggested for further evaluation. No displaced rib fracture identified. Critical Value/emergent results were called by telephone at the time of interpretation on 08/10/2020 at 8:39 am to provider [facility] , who verbally acknowledged these results. Electronically Signed   By: Maisie Fus  Register   On: 08/10/2020 08:42   DG Shoulder Left  Result Date: 08/10/2020 CLINICAL DATA:  Fall, pain EXAM: LEFT SHOULDER - 2+ VIEW  COMPARISON:  08/10/2020 FINDINGS: Limited two-view exam. Bones are osteopenic. Minor degenerative changes of the [facility] joint and glenohumeral joint. No acute osseous finding or malalignment. No displaced fracture. Included left chest unremarkable. Aorta atherosclerotic. IMPRESSION: Degenerative changes and osteopenia. No acute osseous finding or malalignment. Electronically Signed   By: Judie Petit.  Shick M.D.   On: 08/10/2020 12:05   DG Humerus Left  Result Date: 08/10/2020 CLINICAL DATA:  Left arm pain after fall. EXAM: LEFT HUMERUS - 2+ VIEW COMPARISON:  None. FINDINGS: There is no evidence of fracture or other focal bone lesions. Soft tissues are unremarkable. IMPRESSION: Negative. Electronically Signed   By: Lupita Raider M.D.   On: 08/10/2020 12:06   CT Maxillofacial Wo Contrast  Result Date: 08/10/2020 CLINICAL DATA:  Facial trauma, minor. EXAM: CT HEAD WITHOUT CONTRAST CT MAXILLOFACIAL WITHOUT CONTRAST CT CERVICAL SPINE WITHOUT CONTRAST TECHNIQUE: Multidetector CT imaging of the head, cervical spine, and maxillofacial structures were performed using the standard protocol without intravenous contrast. Multiplanar CT image reconstructions of the cervical spine and maxillofacial structures were also generated.  COMPARISON:  07/03/2020 FINDINGS: CT HEAD FINDINGS Brain: No evidence of acute infarction, hemorrhage, hydrocephalus, extra-axial collection or mass lesion/mass effect. Cerebral volume loss in keeping with aging. Small-vessel ischemic gliosis to a mild degree with chronic lacune at the left corona radiata. Vascular: No hyperdense vessel or unexpected calcification. Skull: Hematoma along the left forehead.  No calvarial fracture. CT MAXILLOFACIAL FINDINGS Osseous: Negative for fracture or mandibular dislocation. Orbits: No acute finding. Bowing of the medial wall on the left which may be from old fracture. Sinuses: Essentially complete opacification of the right maxillary sinus with internal frothy  secretions noted. This opacification has progressed from a March 2022 comparison. Soft tissues: Left forehead contusion. CT CERVICAL SPINE FINDINGS Alignment: No traumatic malalignment Skull base and vertebrae: C4-5 mild anterolisthesis. Soft tissues and spinal canal: No prevertebral fluid or swelling. No visible canal hematoma. Disc levels: Multilevel degenerative disc narrowing and ridging. Generalized and occasionally bulky degenerative facet spurring. Facet ankylosis has occurred at C3-4. Multilevel foraminal impingement. Upper chest: No visible injury IMPRESSION: 1. No evidence of acute intracranial or cervical spine injury. 2. Forehead hematoma.  Negative for facial fracture. 3. Active right maxillary sinusitis with progression from March 2022. Electronically Signed   By: Marnee Spring M.D.   On: 08/10/2020 09:32    Microbiology: Recent Results (from the past 240 hour(s))  Resp Panel by RT-PCR (Flu A&B, Covid) Nasopharyngeal Swab     Status: None   Collection Time: 08/10/20 10:19 AM   Specimen: Nasopharyngeal Swab; Nasopharyngeal(NP) swabs in vial transport medium  Result Value Ref Range Status   SARS Coronavirus 2 by RT PCR NEGATIVE NEGATIVE Final    Comment: (NOTE) SARS-CoV-2 target nucleic acids are NOT DETECTED.  The SARS-CoV-2 RNA is generally detectable in upper respiratory specimens during the acute phase of infection. The lowest concentration of SARS-CoV-2 viral copies this assay can detect is 138 copies/mL. A negative result does not preclude SARS-Cov-2 infection and should not be used as the sole basis for treatment or other patient management decisions. A negative result may occur with  improper specimen collection/handling, submission of specimen other than nasopharyngeal swab, presence of viral mutation(s) within the areas targeted by this assay, and inadequate number of viral copies(<138 copies/mL). A negative result must be combined with clinical observations, patient  history, and epidemiological information. The expected result is Negative.  Fact Sheet for Patients:  BloggerCourse.com  Fact Sheet for Healthcare Providers:  SeriousBroker.it  This test is no t yet approved or cleared by the [place] FDA and  has been authorized for detection and/or diagnosis of SARS-CoV-2 by FDA under an Emergency Use Authorization (EUA). This EUA will remain  in effect (meaning this test can be used) for the duration of the COVID-19 declaration under Section 564(b)(1) of the Act, 21 U.S.C.section 360bbb-3(b)(1), unless the authorization is terminated  or revoked sooner.       Influenza A by PCR NEGATIVE NEGATIVE Final   Influenza B by PCR NEGATIVE NEGATIVE Final    Comment: (NOTE) The Xpert Xpress SARS-CoV-2/FLU/RSV plus assay is intended as an aid in the diagnosis of influenza from Nasopharyngeal swab specimens and should not be used as a sole basis for treatment. Nasal washings and aspirates are unacceptable for Xpert Xpress SARS-CoV-2/FLU/RSV testing.  Fact Sheet for Patients: BloggerCourse.com  Fact Sheet for Healthcare Providers: SeriousBroker.it  This test is not yet approved or cleared by the [place] FDA and has been authorized for detection and/or diagnosis of SARS-CoV-2 by FDA under an Emergency  Use Authorization (EUA). This EUA will remain in effect (meaning this test can be used) for the duration of the COVID-19 declaration under Section 564(b)(1) of the Act, 21 U.S.C. section 360bbb-3(b)(1), unless the authorization is terminated or revoked.  Performed at [facility], [address] [place], [place] [address]      Labs: Basic Metabolic Panel: Recent Labs  Lab 08/10/20 1023 08/11/20 0305  NA 142 140  K 3.8 3.8  CL 106 105  CO2 28 28  GLUCOSE 130* 132*  BUN 19 26*  CREATININE 0.94 1.09*  CALCIUM 8.6*  8.4*  MG  --  2.0   Liver Function Tests: Recent Labs  Lab 08/11/20 0305  AST 15  ALT 15  ALKPHOS 112  BILITOT 0.7  PROT 5.9*  ALBUMIN 3.0*   CBC: Recent Labs  Lab 08/10/20 1023 08/11/20 0305  WBC 9.4 8.3  NEUTROABS 7.7  --   HGB 11.7* 10.6*  HCT 37.7 34.4*  MCV 92.2 93.2  PLT 331 289    Principal Problem:   Rib fractures Active Problems:   Chronic kidney disease, stage III (moderate) (HCC)   Fall at home, initial encounter   Aortic atherosclerosis (HCC)   Time coordinating discharge: 40 minutes  Signed:  Brendia Sacks, MD  [facility]  08/12/2020, 11:08 AM

## 2020-08-12 NOTE — Progress Notes (Signed)
Progress Note     Subjective: CC: feeling well and in good spirits. Denies any pain. Her only complaint is numbness in her fingers for which she is planning to see outpatient neurology. She denies SHOB, nausea, emesis, calf pain  Objective: Vital signs in last 24 hours: Temp:  [98 F (36.7 C)-98.2 F (36.8 C)] 98.2 F (36.8 C) (05/26 0519) Pulse Rate:  [64-77] 64 (05/26 0519) Resp:  [15-18] 15 (05/26 0519) BP: (126-155)/(61-75) 142/70 (05/26 0519) SpO2:  [97 %-98 %] 98 % (05/26 0519) Last BM Date: 08/11/20  Intake/Output from previous day: 05/25 0701 - 05/26 0700 In: 720 [P.O.:720] Out: 950 [Urine:950] Intake/Output this shift: No intake/output data recorded.  PE: General: pleasant, WD, female who is laying in bed in NAD HEENT: left forehead abrasion with dressing in place - removed for exam. Surgical glue came off with dressing. Abrasion and laceration without purulent discharge or worsening erythema. bandaids on nose and left check removed - small abrasions Heart:  Palpable radial pulses bilaterally Lungs: Respiratory effort nonlabored Abd: soft, NT, ND, no masses, hernias, or organomegaly MS: all 4 extremities are symmetrical with no cyanosis, clubbing, or edema. LLE with dressings C/DI. No bruising or erythema of left shoulder Skin: warm and dry with no masses, lesions, or rashes Psych: A&Ox3 with an appropriate affect.    Lab Results:  Recent Labs    08/10/20 1023 08/11/20 0305  WBC 9.4 8.3  HGB 11.7* 10.6*  HCT 37.7 34.4*  PLT 331 289   BMET Recent Labs    08/10/20 1023 08/11/20 0305  NA 142 140  K 3.8 3.8  CL 106 105  CO2 28 28  GLUCOSE 130* 132*  BUN 19 26*  CREATININE 0.94 1.09*  CALCIUM 8.6* 8.4*   PT/INR No results for input(s): LABPROT, INR in the last 72 hours. CMP     Component Value Date/Time   NA 140 08/11/2020 0305   NA 141 03/26/2015 0804   K 3.8 08/11/2020 0305   CL 105 08/11/2020 0305   CO2 28 08/11/2020 0305   GLUCOSE 132  (H) 08/11/2020 0305   BUN 26 (H) 08/11/2020 0305   BUN 25 03/26/2015 0804   CREATININE 1.09 (H) 08/11/2020 0305   CREATININE 1.27 (H) 06/23/2020 0000   CALCIUM 8.4 (L) 08/11/2020 0305   PROT 5.9 (L) 08/11/2020 0305   PROT 6.2 03/26/2015 0804   ALBUMIN 3.0 (L) 08/11/2020 0305   ALBUMIN 3.9 03/26/2015 0804   AST 15 08/11/2020 0305   ALT 15 08/11/2020 0305   ALKPHOS 112 08/11/2020 0305   BILITOT 0.7 08/11/2020 0305   BILITOT 0.5 03/26/2015 0804   GFRNONAA 46 (L) 08/11/2020 0305   GFRNONAA 36 (L) 06/23/2020 0000   GFRAA 41 (L) 06/23/2020 0000   Lipase  No results found for: LIPASE     Studies/Results: CT ABDOMEN PELVIS WO CONTRAST  Result Date: 08/10/2020 CLINICAL DATA:  Fall with rib fractures by x-ray. EXAM: CT ABDOMEN AND PELVIS WITHOUT CONTRAST TECHNIQUE: Multidetector CT imaging of the abdomen and pelvis was performed following the standard protocol without IV contrast. COMPARISON:  09/02/2019 FINDINGS: Lower chest:  No contributory findings. Hepatobiliary: No focal liver abnormality.Cholelithiasis. Interval clearance of CBD high-density. Pancreas: Generalized atrophy Spleen: Unremarkable. Adrenals/Urinary Tract: Negative adrenals. No hydronephrosis or stone. Bilateral renal cystic densities. Full urinary bladder which may account for pericystic fat stranding. No internal or mural high-density to implicate a traumatic cause. Stomach/Bowel:  No obstruction. No appendicitis. Vascular/Lymphatic: No acute vascular abnormality. Diffuse atheromatous  calcification of the aorta and iliacs. Dilated infrarenal aorta measuring up to 2.6 cm in diameter. No mass or adenopathy. Reproductive:Unremarkable for age Other: No ascites or pneumoperitoneum. Musculoskeletal: Right posterior rib fractures involving 9, 10, 11, and 12. On the prior chest CT the right 8th rib was also involved. Ordinary spinal degeneration. Generalized osteopenia. IMPRESSION: 1. Known right-sided rib fractures involving 8 through  12. 2. Distended urinary bladder which could account for pericystic fat edema. Please correlate for cystitis. 3. Chronic findings are stable from 2021 and described above. Electronically Signed   By: Marnee Spring M.D.   On: 08/10/2020 11:57   DG Pelvis 1-2 Views  Result Date: 08/10/2020 CLINICAL DATA:  Pelvic pain after fall. EXAM: PELVIS - 1-2 VIEW COMPARISON:  None. FINDINGS: There is no evidence of pelvic fracture or diastasis. No pelvic bone lesions are seen. IMPRESSION: Negative. Electronically Signed   By: Lupita Raider M.D.   On: 08/10/2020 12:07   DG CHEST PORT 1 VIEW  Result Date: 08/11/2020 CLINICAL DATA:  Rib fractures of right ribs. EXAM: PORTABLE CHEST 1 VIEW COMPARISON:  Chest x-ray 08/10/2020, CT chest 08/10/2020 FINDINGS: The heart size and mediastinal contours are unchanged. Aortic calcification. No focal consolidation. No pulmonary edema. Question trace pleural fluid at the right apex. No left pleural effusion. No pneumothorax. No acute osseous abnormality. Known rib fractures not well visualized. IMPRESSION: Question trace pleural fluid at the right apex. Electronically Signed   By: Tish Frederickson M.D.   On: 08/11/2020 05:30   DG Shoulder Left  Result Date: 08/10/2020 CLINICAL DATA:  Fall, pain EXAM: LEFT SHOULDER - 2+ VIEW COMPARISON:  08/10/2020 FINDINGS: Limited two-view exam. Bones are osteopenic. Minor degenerative changes of the [facility] joint and glenohumeral joint. No acute osseous finding or malalignment. No displaced fracture. Included left chest unremarkable. Aorta atherosclerotic. IMPRESSION: Degenerative changes and osteopenia. No acute osseous finding or malalignment. Electronically Signed   By: Judie Petit.  Shick M.D.   On: 08/10/2020 12:05   DG Humerus Left  Result Date: 08/10/2020 CLINICAL DATA:  Left arm pain after fall. EXAM: LEFT HUMERUS - 2+ VIEW COMPARISON:  None. FINDINGS: There is no evidence of fracture or other focal bone lesions. Soft tissues are unremarkable.  IMPRESSION: Negative. Electronically Signed   By: Lupita Raider M.D.   On: 08/10/2020 12:06    Anti-infectives: Anti-infectives (From admission, onward)   None       Assessment/Plan  Fall Multiple R rib fxs 8-11 - Multimodal pain control and pulmonary toilet. Follow up CXR 5/25 stable without PNX Forehead laceration/ hematoma - s/p dermabond 5/24 - came off 5/26. Local wound care Multiple LE abrasions - local wound care LUE pain - xray negative for fracture, possible bone contusion. Pain control, ice/heat Frequent falls - per daughter patient has had at least 10 falls in the last 6 months HTN HLD CKD Recurrent UTIs OAB Hx remote CVA Chronic neck pain - takes tramadol PRN Limited mobility - uses wheelchair most of the time  Plan -PT/OT evaluations. SNF  At this time surgery will sign off but please contact use with any questions or concerns    LOS: 1 day    Eric Form, [facility] Surgery 08/12/2020, 9:34 AM Please see Amion for pager number during day hours 7:00am-4:30pm

## 2020-08-12 NOTE — Plan of Care (Signed)
  Problem: Education: Goal: Knowledge of General Education information will improve Description: Including pain rating scale, medication(s)/side effects and non-pharmacologic comfort measures Outcome: Progressing   Problem: Health Behavior/Discharge Planning: Goal: Ability to manage health-related needs will improve Outcome: Progressing   Problem: Pain Managment: Goal: General experience of comfort will improve Outcome: Progressing   

## 2020-08-12 NOTE — Progress Notes (Signed)
Called report to [place], Charity fundraiser at [facility]. IV has been taken out. Patient is dressed and has no pain at this time. Patient already ate lunch. All of her belongings are gathered. Discharge orders were written and have been reviewed with the patient. Patient is waiting on ride via [facility] to go to [facility].

## 2020-08-12 NOTE — TOC Transition Note (Signed)
Transition of Care [facility]) - CM/SW Discharge Note   Patient Details  Name: Lisa [name] MRN: [id number] Date of Birth: [birth year]  [facility] of [facility]) CM/SW Contact:  Amada Jupiter, LCSW Phone Number: 08/12/2020, 12:09 PM   Clinical Narrative:    Pt is medically cleared for dc to SNF today. Pt and daughter have accepted SNF bed at [facility] and insurance authorization received.  [facility] called (12:10pm).  RN to call report to [phone].  No further TOC needs.   Final next level of care: Skilled Nursing Facility Barriers to Discharge: Barriers Resolved   Patient Goals and CMS Choice Patient states their goals for this hospitalization and ongoing recovery are:: to eventually return to ALF      Discharge Placement   Existing PASRR number confirmed : 08/11/20          [name] Patient to be transferred to facility by: [facility] Name of family member notified: daughter, Olegario Messier Patient and family notified of of transfer: 08/12/20  Discharge Plan and Services In-house Referral: Clinical Social Work              DME Arranged: N/A DME Agency: NA                  Social Determinants of Health (SDOH) Interventions     Readmission Risk Interventions Readmission Risk Prevention Plan 09/04/2019  Post Dischage Appt Not Complete  Medication Screening Complete  Transportation Screening Complete  Some recent data might be hidden

## 2020-08-12 NOTE — Progress Notes (Signed)
The patient is alert and oriented and has been seen by her physician. The orders for discharge were written. IV has been removed. Went over discharge instructions with patient. She is being discharged to [facility] via [facility] with all of her belongings.

## 2020-08-18 DIAGNOSIS — S2241XD Multiple fractures of ribs, right side, subsequent encounter for fracture with routine healing: Secondary | ICD-10-CM | POA: Diagnosis not present

## 2020-08-18 DIAGNOSIS — Z4789 Encounter for other orthopedic aftercare: Secondary | ICD-10-CM | POA: Diagnosis not present

## 2020-08-18 DIAGNOSIS — Z9181 History of falling: Secondary | ICD-10-CM | POA: Diagnosis not present

## 2020-08-18 DIAGNOSIS — R2681 Unsteadiness on feet: Secondary | ICD-10-CM | POA: Diagnosis not present

## 2020-08-18 DIAGNOSIS — M6282 Rhabdomyolysis: Secondary | ICD-10-CM | POA: Diagnosis not present

## 2020-08-18 DIAGNOSIS — R55 Syncope and collapse: Secondary | ICD-10-CM | POA: Diagnosis not present

## 2020-08-18 DIAGNOSIS — M6281 Muscle weakness (generalized): Secondary | ICD-10-CM | POA: Diagnosis not present

## 2020-08-18 DIAGNOSIS — G5603 Carpal tunnel syndrome, bilateral upper limbs: Secondary | ICD-10-CM | POA: Diagnosis not present

## 2020-08-19 ENCOUNTER — Encounter (INDEPENDENT_AMBULATORY_CARE_PROVIDER_SITE_OTHER): Payer: Medicare Other | Admitting: Ophthalmology

## 2020-08-25 ENCOUNTER — Ambulatory Visit: Payer: Medicare Other | Admitting: Family Medicine

## 2020-08-26 DIAGNOSIS — G5603 Carpal tunnel syndrome, bilateral upper limbs: Secondary | ICD-10-CM | POA: Diagnosis not present

## 2020-08-31 ENCOUNTER — Ambulatory Visit: Payer: Medicare Other | Admitting: Family Medicine

## 2020-09-03 ENCOUNTER — Telehealth: Payer: Self-pay | Admitting: *Deleted

## 2020-09-03 DIAGNOSIS — I700 Atherosclerosis of aorta: Secondary | ICD-10-CM | POA: Diagnosis not present

## 2020-09-03 DIAGNOSIS — N1831 Chronic kidney disease, stage 3a: Secondary | ICD-10-CM | POA: Diagnosis not present

## 2020-09-03 DIAGNOSIS — I129 Hypertensive chronic kidney disease with stage 1 through stage 4 chronic kidney disease, or unspecified chronic kidney disease: Secondary | ICD-10-CM | POA: Diagnosis not present

## 2020-09-03 DIAGNOSIS — I951 Orthostatic hypotension: Secondary | ICD-10-CM | POA: Diagnosis not present

## 2020-09-03 DIAGNOSIS — M1711 Unilateral primary osteoarthritis, right knee: Secondary | ICD-10-CM | POA: Diagnosis not present

## 2020-09-03 DIAGNOSIS — Z87440 Personal history of urinary (tract) infections: Secondary | ICD-10-CM | POA: Diagnosis not present

## 2020-09-03 DIAGNOSIS — S2241XD Multiple fractures of ribs, right side, subsequent encounter for fracture with routine healing: Secondary | ICD-10-CM | POA: Diagnosis not present

## 2020-09-03 DIAGNOSIS — R296 Repeated falls: Secondary | ICD-10-CM | POA: Diagnosis not present

## 2020-09-03 NOTE — Telephone Encounter (Signed)
Patient discharged from SNF.  Meredith with [facility] is requesting order for OT 1x2wks, 2x1wks, 1x1wks  Message left for patient to call to schedule follow up appointment.   Please Advise.

## 2020-09-06 DIAGNOSIS — I700 Atherosclerosis of aorta: Secondary | ICD-10-CM | POA: Diagnosis not present

## 2020-09-06 DIAGNOSIS — M1711 Unilateral primary osteoarthritis, right knee: Secondary | ICD-10-CM | POA: Diagnosis not present

## 2020-09-06 DIAGNOSIS — N1831 Chronic kidney disease, stage 3a: Secondary | ICD-10-CM | POA: Diagnosis not present

## 2020-09-06 DIAGNOSIS — I951 Orthostatic hypotension: Secondary | ICD-10-CM | POA: Diagnosis not present

## 2020-09-06 DIAGNOSIS — S2241XD Multiple fractures of ribs, right side, subsequent encounter for fracture with routine healing: Secondary | ICD-10-CM | POA: Diagnosis not present

## 2020-09-06 DIAGNOSIS — I129 Hypertensive chronic kidney disease with stage 1 through stage 4 chronic kidney disease, or unspecified chronic kidney disease: Secondary | ICD-10-CM | POA: Diagnosis not present

## 2020-09-07 DIAGNOSIS — N1831 Chronic kidney disease, stage 3a: Secondary | ICD-10-CM | POA: Diagnosis not present

## 2020-09-07 DIAGNOSIS — S2241XD Multiple fractures of ribs, right side, subsequent encounter for fracture with routine healing: Secondary | ICD-10-CM | POA: Diagnosis not present

## 2020-09-07 DIAGNOSIS — I129 Hypertensive chronic kidney disease with stage 1 through stage 4 chronic kidney disease, or unspecified chronic kidney disease: Secondary | ICD-10-CM | POA: Diagnosis not present

## 2020-09-07 DIAGNOSIS — I700 Atherosclerosis of aorta: Secondary | ICD-10-CM | POA: Diagnosis not present

## 2020-09-07 DIAGNOSIS — M1711 Unilateral primary osteoarthritis, right knee: Secondary | ICD-10-CM | POA: Diagnosis not present

## 2020-09-07 DIAGNOSIS — I951 Orthostatic hypotension: Secondary | ICD-10-CM | POA: Diagnosis not present

## 2020-09-07 NOTE — Telephone Encounter (Signed)
Meredith notified and agreed.

## 2020-09-08 DIAGNOSIS — I129 Hypertensive chronic kidney disease with stage 1 through stage 4 chronic kidney disease, or unspecified chronic kidney disease: Secondary | ICD-10-CM | POA: Diagnosis not present

## 2020-09-08 DIAGNOSIS — N1831 Chronic kidney disease, stage 3a: Secondary | ICD-10-CM | POA: Diagnosis not present

## 2020-09-08 DIAGNOSIS — M1711 Unilateral primary osteoarthritis, right knee: Secondary | ICD-10-CM | POA: Diagnosis not present

## 2020-09-08 DIAGNOSIS — I951 Orthostatic hypotension: Secondary | ICD-10-CM | POA: Diagnosis not present

## 2020-09-08 DIAGNOSIS — S2241XD Multiple fractures of ribs, right side, subsequent encounter for fracture with routine healing: Secondary | ICD-10-CM | POA: Diagnosis not present

## 2020-09-08 DIAGNOSIS — I700 Atherosclerosis of aorta: Secondary | ICD-10-CM | POA: Diagnosis not present

## 2020-09-09 DIAGNOSIS — I951 Orthostatic hypotension: Secondary | ICD-10-CM | POA: Diagnosis not present

## 2020-09-09 DIAGNOSIS — M1711 Unilateral primary osteoarthritis, right knee: Secondary | ICD-10-CM | POA: Diagnosis not present

## 2020-09-09 DIAGNOSIS — I129 Hypertensive chronic kidney disease with stage 1 through stage 4 chronic kidney disease, or unspecified chronic kidney disease: Secondary | ICD-10-CM | POA: Diagnosis not present

## 2020-09-09 DIAGNOSIS — S2241XD Multiple fractures of ribs, right side, subsequent encounter for fracture with routine healing: Secondary | ICD-10-CM | POA: Diagnosis not present

## 2020-09-09 DIAGNOSIS — I700 Atherosclerosis of aorta: Secondary | ICD-10-CM | POA: Diagnosis not present

## 2020-09-09 DIAGNOSIS — N1831 Chronic kidney disease, stage 3a: Secondary | ICD-10-CM | POA: Diagnosis not present

## 2020-09-10 DIAGNOSIS — G5603 Carpal tunnel syndrome, bilateral upper limbs: Secondary | ICD-10-CM | POA: Diagnosis not present

## 2020-09-15 DIAGNOSIS — N1831 Chronic kidney disease, stage 3a: Secondary | ICD-10-CM | POA: Diagnosis not present

## 2020-09-15 DIAGNOSIS — I700 Atherosclerosis of aorta: Secondary | ICD-10-CM | POA: Diagnosis not present

## 2020-09-15 DIAGNOSIS — M1711 Unilateral primary osteoarthritis, right knee: Secondary | ICD-10-CM | POA: Diagnosis not present

## 2020-09-15 DIAGNOSIS — S2241XD Multiple fractures of ribs, right side, subsequent encounter for fracture with routine healing: Secondary | ICD-10-CM | POA: Diagnosis not present

## 2020-09-15 DIAGNOSIS — I129 Hypertensive chronic kidney disease with stage 1 through stage 4 chronic kidney disease, or unspecified chronic kidney disease: Secondary | ICD-10-CM | POA: Diagnosis not present

## 2020-09-15 DIAGNOSIS — I951 Orthostatic hypotension: Secondary | ICD-10-CM | POA: Diagnosis not present

## 2020-09-16 DIAGNOSIS — I129 Hypertensive chronic kidney disease with stage 1 through stage 4 chronic kidney disease, or unspecified chronic kidney disease: Secondary | ICD-10-CM | POA: Diagnosis not present

## 2020-09-16 DIAGNOSIS — I951 Orthostatic hypotension: Secondary | ICD-10-CM | POA: Diagnosis not present

## 2020-09-16 DIAGNOSIS — N1831 Chronic kidney disease, stage 3a: Secondary | ICD-10-CM | POA: Diagnosis not present

## 2020-09-16 DIAGNOSIS — I700 Atherosclerosis of aorta: Secondary | ICD-10-CM | POA: Diagnosis not present

## 2020-09-16 DIAGNOSIS — M1711 Unilateral primary osteoarthritis, right knee: Secondary | ICD-10-CM | POA: Diagnosis not present

## 2020-09-16 DIAGNOSIS — S2241XD Multiple fractures of ribs, right side, subsequent encounter for fracture with routine healing: Secondary | ICD-10-CM | POA: Diagnosis not present

## 2020-09-17 DIAGNOSIS — Z9181 History of falling: Secondary | ICD-10-CM | POA: Diagnosis not present

## 2020-09-17 DIAGNOSIS — M6282 Rhabdomyolysis: Secondary | ICD-10-CM | POA: Diagnosis not present

## 2020-09-17 DIAGNOSIS — R55 Syncope and collapse: Secondary | ICD-10-CM | POA: Diagnosis not present

## 2020-09-22 DIAGNOSIS — I129 Hypertensive chronic kidney disease with stage 1 through stage 4 chronic kidney disease, or unspecified chronic kidney disease: Secondary | ICD-10-CM | POA: Diagnosis not present

## 2020-09-22 DIAGNOSIS — S2241XD Multiple fractures of ribs, right side, subsequent encounter for fracture with routine healing: Secondary | ICD-10-CM | POA: Diagnosis not present

## 2020-09-22 DIAGNOSIS — I700 Atherosclerosis of aorta: Secondary | ICD-10-CM | POA: Diagnosis not present

## 2020-09-22 DIAGNOSIS — M1711 Unilateral primary osteoarthritis, right knee: Secondary | ICD-10-CM | POA: Diagnosis not present

## 2020-09-22 DIAGNOSIS — N1831 Chronic kidney disease, stage 3a: Secondary | ICD-10-CM | POA: Diagnosis not present

## 2020-09-22 DIAGNOSIS — I951 Orthostatic hypotension: Secondary | ICD-10-CM | POA: Diagnosis not present

## 2020-09-23 ENCOUNTER — Non-Acute Institutional Stay: Payer: Medicare Other | Admitting: Nurse Practitioner

## 2020-09-23 ENCOUNTER — Other Ambulatory Visit: Payer: Self-pay

## 2020-09-23 VITALS — BP 158/60 | HR 75 | Resp 18 | Ht 67.0 in | Wt 112.0 lb

## 2020-09-23 DIAGNOSIS — R634 Abnormal weight loss: Secondary | ICD-10-CM

## 2020-09-23 DIAGNOSIS — Z515 Encounter for palliative care: Secondary | ICD-10-CM | POA: Diagnosis not present

## 2020-09-23 NOTE — Progress Notes (Signed)
[facility] Consult Note Telephone: [phone]  Fax: [phone]   Date of encounter: 09/23/20 PATIENT NAME: Lisa [name] Rm [place] [id number]   [phone] (home)  DOB: [birth year] MRN: [id number]  PRIMARY CARE PROVIDER:    Wardell Honour, MD,  [place] [address] [phone]  REFERRING PROVIDER:   Wardell Honour, MD [address] [place],  [place] [address] [phone]  RESPONSIBLE PARTY:    Contact Information     Name Relation Home Work Mobile   [place] Daughter [phone]  [phone]   [name],Lisa Daughter   [phone]     I met face to face with patient in facility.  Palliative Care was asked to follow this patient by consultation request of  Lisa Honour, MD to address advance care planning and complex medical decision making. This is a follow up visit.                                   ASSESSMENT AND PLAN / RECOMMENDATIONS:   Advance Care Planning/Goals of Care: Goals include to maximize quality of life and symptom management. Our advance care planning conversation included a discussion about:    The value and importance of advance care planning  Experiences with loved ones who have been seriously ill or have died  Exploration of personal, cultural or spiritual beliefs that might influence medical decisions  Exploration of goals of care in the event of a sudden injury or illness  Decision to de-escalate disease focused treatments due to poor prognosis. CODE STATUS: DNR Patient's goal of care is comfort. Patient reiterated desire for strict comfort measures saying she does not want anything done to prolong her life. She also reiterated decision to not be resuscitated in the event of cardiac or respiratory arrest. She has a signed DNR and MOST forms present on chart in the facility and on [place] EMR. Details of MOST form include comfort  measures, determine use and limitation of antibiotics when infection occurs, no IV fluids, no feeding tubes.  Symptom Management/Plan: Abnormal weight loss: Patient with about 18% weight loss in the last 6 months. Intake reported to be 25-50%. We discussed patient's current condition and what it means in the larger context of patient's on-going co-morbidities. I attempted to elicit values and goals of care important to the patient. The difference between aggressive medical intervention and comfort care was discussed in light of the patient's goals of care of comfort. Hospice services and philosophy was discussed and explained in detail to patient and also to her daughter Lisa [name] on phone per patient's request. Patient and her daughter expressed interest in Hospice care. Decision was made to de-escalate disease focused treatments due to poor prognosis.  Follow up Palliative Care Visit: Palliative care will continue to follow for complex medical decision making, advance care planning, and clarification of goals. Return as needed.  PPS: 30%  HOSPICE ELIGIBILITY/DIAGNOSIS: yes/abnormal weight loss  CHIEF COMPLAIN: weight loss  History obtained from review of Epic EMR and discussion with facility staff, and interview with Lisa [name].   HISTORY OF PRESENT ILLNESS:  Lisa [name] is a 87 y.o. year old female with multiple medical problems including orthostatic blood pressure, aortic atherosclerosis, Lichen Sclerosis (on Estrogen cream and Clobetasol cream), depression, Insomnia, Osteoarthritis of neck, CKD 3, HTN, hx frequent UTI (followed by  Urology Lisa [name], on Probiotics and daily Cranberry extract), hx of CVA (patient stopped taking Plavix due to bleeding issues), multiple pulmonary nodules no further diagnostics desired by patient.  Patient with report of weight loss related to poor oral intake. Patient with 18% weight loss in the last 6 months, weight down to 112lbs from 140lbs six month ago.  Patient report increasing generalized weakness, she now depends on staff for all her ADLs, unable to bear weight on her legs. She is s/p rehab stay form hospitalization from 08/10/2020 to 08/12/2020 for fall which resulted in several rib fractures. She has history of falls from orthostatic blood pressure. She report ongoing bilateral hand numbness ongoing in the last 3-4 months followed by ortho, had steroid injections without improvement in symptoms per patient.  I reviewed available labs, medications, imaging, studies and related documents from the EMR.  Records reviewed and summarized above.   Vitals with BMI 09/23/2020 08/12/2020 08/12/2020  Height '5\' 7"'  - -  Weight 112 lbs - -  BMI 30.13 - -  Systolic 143 888 757  Diastolic 60 63 70  [name] 75 82 64    Physical Exam: Current and past weights:112lbs down from 140.8lbs six month ago General: chronically ill and frail appearing, thin, sitting in chair in her room in NAD EYES: anicteric sclera, no discharge  ENMT: mild hard of hearing, oral mucous membranes moist CV: S1S2 normal, no LE edema Pulmonary: LCTA, no increased work of breathing, no cough, room air Abdomen: intake 25-50%, soft and non tender, no ascites MSK: no sarcopenia, moves all extremities, non ambulatory Skin: warm and dry, multiple abraisions and skin tears to bilateral arms and lower legs. Neuro: generalized weakness, no cognitive impairment Psych: non-anxious affect, A and O x 3 Hem/lymph/immuno: no widespread bruising  Past Medical History:  Diagnosis Date   Cervicalgia    Chronic kidney disease    Chronic kidney disease, stage III (moderate) (HCC)    Dermatophytosis of groin and perianal area    Dyspnea    Dysrhythmia    Hyperlipidemia LDL goal < 100    Insomnia, unspecified    Muscle weakness (generalized)    Orthostatic hypotension    Osteoarthrosis, unspecified whether generalized or localized, lower leg    osteoarthritis - upper thoracic area, left knee.    Other abnormal glucose    Other B-complex deficiencies    Other malaise and fatigue    Palpitations    Senile cataract, unspecified    Shortness of breath    Stroke ([place])    small "stroke" - right sided weakness for a day.   Stroke ([place])    Tachycardia, unspecified    Undiagnosed cardiac murmurs    Unspecified essential hypertension    Unspecified vitamin D deficiency    Urinary tract infection     I spent 50 minutes providing this consultation. More than 50% of the time in this consultation was spent in counseling and care coordination.  Thank you for the opportunity to participate in the care of Ms. Pritts.  The palliative care team will continue to follow. Please call our office at [phone] if we can be of additional assistance.   Jari Favre, DNP, AGPCNP-BC  COVID-19 PATIENT SCREENING TOOL Asked and negative response unless otherwise noted:   Have you had symptoms of covid, tested positive or been in contact with someone with symptoms/positive test in the past 5-10 days?

## 2020-09-24 ENCOUNTER — Telehealth: Payer: Self-pay | Admitting: Family Medicine

## 2020-09-24 DIAGNOSIS — N1831 Chronic kidney disease, stage 3a: Secondary | ICD-10-CM | POA: Diagnosis not present

## 2020-09-24 DIAGNOSIS — I129 Hypertensive chronic kidney disease with stage 1 through stage 4 chronic kidney disease, or unspecified chronic kidney disease: Secondary | ICD-10-CM | POA: Diagnosis not present

## 2020-09-24 DIAGNOSIS — S2241XD Multiple fractures of ribs, right side, subsequent encounter for fracture with routine healing: Secondary | ICD-10-CM | POA: Diagnosis not present

## 2020-09-24 DIAGNOSIS — I700 Atherosclerosis of aorta: Secondary | ICD-10-CM | POA: Diagnosis not present

## 2020-09-24 DIAGNOSIS — I951 Orthostatic hypotension: Secondary | ICD-10-CM | POA: Diagnosis not present

## 2020-09-24 DIAGNOSIS — M1711 Unilateral primary osteoarthritis, right knee: Secondary | ICD-10-CM | POA: Diagnosis not present

## 2020-09-24 NOTE — Telephone Encounter (Signed)
RN-Queeneph with [facility] called after assessing Lisa [name] this am stating she needs hospice referral to [facility].  Please advise Thanks, Lisa [name]

## 2020-09-27 DIAGNOSIS — I700 Atherosclerosis of aorta: Secondary | ICD-10-CM | POA: Diagnosis not present

## 2020-09-27 DIAGNOSIS — S2241XD Multiple fractures of ribs, right side, subsequent encounter for fracture with routine healing: Secondary | ICD-10-CM | POA: Diagnosis not present

## 2020-09-27 DIAGNOSIS — M1711 Unilateral primary osteoarthritis, right knee: Secondary | ICD-10-CM | POA: Diagnosis not present

## 2020-09-27 DIAGNOSIS — N1831 Chronic kidney disease, stage 3a: Secondary | ICD-10-CM | POA: Diagnosis not present

## 2020-09-27 DIAGNOSIS — I951 Orthostatic hypotension: Secondary | ICD-10-CM | POA: Diagnosis not present

## 2020-09-27 DIAGNOSIS — I129 Hypertensive chronic kidney disease with stage 1 through stage 4 chronic kidney disease, or unspecified chronic kidney disease: Secondary | ICD-10-CM | POA: Diagnosis not present

## 2020-09-27 NOTE — Telephone Encounter (Signed)
Hospice notified and agreed.    Frederica Kuster, MD to Lenard Galloway    10:43 AM Okay to involve hospice

## 2020-09-28 ENCOUNTER — Other Ambulatory Visit: Payer: Self-pay

## 2020-09-28 ENCOUNTER — Ambulatory Visit (INDEPENDENT_AMBULATORY_CARE_PROVIDER_SITE_OTHER): Payer: Medicare Other | Admitting: Ophthalmology

## 2020-09-28 ENCOUNTER — Encounter (INDEPENDENT_AMBULATORY_CARE_PROVIDER_SITE_OTHER): Payer: Self-pay | Admitting: Ophthalmology

## 2020-09-28 DIAGNOSIS — H348312 Tributary (branch) retinal vein occlusion, right eye, stable: Secondary | ICD-10-CM | POA: Diagnosis not present

## 2020-09-28 DIAGNOSIS — H353122 Nonexudative age-related macular degeneration, left eye, intermediate dry stage: Secondary | ICD-10-CM

## 2020-09-28 DIAGNOSIS — M1711 Unilateral primary osteoarthritis, right knee: Secondary | ICD-10-CM | POA: Diagnosis not present

## 2020-09-28 DIAGNOSIS — I951 Orthostatic hypotension: Secondary | ICD-10-CM | POA: Diagnosis not present

## 2020-09-28 DIAGNOSIS — I700 Atherosclerosis of aorta: Secondary | ICD-10-CM | POA: Diagnosis not present

## 2020-09-28 DIAGNOSIS — H34831 Tributary (branch) retinal vein occlusion, right eye, with macular edema: Secondary | ICD-10-CM | POA: Diagnosis not present

## 2020-09-28 DIAGNOSIS — S0522XD Ocular laceration and rupture with prolapse or loss of intraocular tissue, left eye, subsequent encounter: Secondary | ICD-10-CM

## 2020-09-28 DIAGNOSIS — H35351 Cystoid macular degeneration, right eye: Secondary | ICD-10-CM

## 2020-09-28 DIAGNOSIS — N1831 Chronic kidney disease, stage 3a: Secondary | ICD-10-CM | POA: Diagnosis not present

## 2020-09-28 DIAGNOSIS — S2241XD Multiple fractures of ribs, right side, subsequent encounter for fracture with routine healing: Secondary | ICD-10-CM | POA: Diagnosis not present

## 2020-09-28 DIAGNOSIS — I129 Hypertensive chronic kidney disease with stage 1 through stage 4 chronic kidney disease, or unspecified chronic kidney disease: Secondary | ICD-10-CM | POA: Diagnosis not present

## 2020-09-28 NOTE — Assessment & Plan Note (Signed)
Currently resolved OD

## 2020-09-28 NOTE — Assessment & Plan Note (Signed)
No signs of CNVM formation OS.  Patient has asked to discontinue PreserVision.  I see no reason to continue its use because of the large subfoveal drusenoid deposit left eye

## 2020-09-28 NOTE — Assessment & Plan Note (Signed)
Repaired per Dr Fabian Sharp

## 2020-09-28 NOTE — Assessment & Plan Note (Signed)
No change and no recurrence of CME and or N/V will observe OD

## 2020-09-28 NOTE — Progress Notes (Signed)
09/28/2020     CHIEF COMPLAINT Patient presents for Retina Follow Up (5 month fu OU and Avastin OD OCT/FP/Pt states VA OU stable since last visit. Pt denies FOL, floaters, or ocular pain OU. /)   HISTORY OF PRESENT ILLNESS: Lisa [name] is a 87 y.o. female who presents to the clinic today for:   HPI     Retina Follow Up           Diagnosis: CRVO/BRVO   Laterality: right eye   Onset: 5 months ago   Severity: mild   Duration: 5 months   Course: stable   Comments: 5 month fu OU and Avastin OD OCT/FP Pt states VA OU stable since last visit. Pt denies FOL, floaters, or ocular pain OU.         Last edited by Demetrios Loll, COA on 09/28/2020 11:06 AM.      Referring physician: Kermit Balo, DO [address] [place],  [place] [address]  HISTORICAL INFORMATION:   Selected notes from the MEDICAL RECORD NUMBER    Lab Results  Component Value Date   HGBA1C 5.4 04/08/2020     CURRENT MEDICATIONS: No current outpatient medications on file. (Ophthalmic Drugs)   No current facility-administered medications for this visit. (Ophthalmic Drugs)   Current Outpatient Medications (Other)  Medication Sig   acetaminophen (TYLENOL) 325 MG tablet Take 2 tablets (650 mg total) by mouth every 6 (six) hours as needed for mild pain (or Fever >/= 101).   b complex vitamins capsule Take 1 capsule by mouth daily.   bacitracin 500 UNIT/GM ointment Apply topically daily.   carvedilol (COREG) 6.25 MG tablet Take 1/2 tablet by mouth twice daily (Patient taking differently: Take 6.25 mg by mouth 2 (two) times daily with a meal.)   Cholecalciferol (VITAMIN D3) 10 MCG (400 UNIT) CAPS Take 400 Units by mouth daily.   Cyanocobalamin 1000 MCG CAPS Take 1,000 mcg by mouth daily.   estradiol (ESTRACE) 0.1 MG/GM vaginal cream Place 1 Applicatorful vaginally every 14 (fourteen) days.   gabapentin (NEURONTIN) 100 MG capsule Take 100 mg by mouth 3 (three) times daily. For Hand pain   Lidocaine HCl  200 MG/10ML SOSY Place 25 mLs vaginally every 12 (twelve) hours as needed (for atrophy).   mometasone (ELOCON) 0.1 % ointment Apply 1 application topically 2 (two) times daily. To vaginal area   Multiple Vitamins-Minerals (PRESERVISION AREDS 2+MULTI VIT) CAPS Take 1 capsule by mouth 2 (two) times daily.    ondansetron (ZOFRAN) 4 MG tablet Take 4 mg by mouth every 6 (six) hours as needed for nausea or vomiting.   phenazopyridine (PYRIDIUM) 95 MG tablet Take 95 mg by mouth daily. AZO- Cranberry - Vit C, 250-30 mg   polyethylene glycol (MIRALAX / GLYCOLAX) 17 g packet Take 17 g by mouth daily as needed for mild constipation.   pyridoxine (B-6) 100 MG tablet Take 100 mg by mouth every 8 (eight) hours as needed (burning upon urination).   traMADol (ULTRAM) 50 MG tablet Take 1 tablet (50 mg total) by mouth 2 (two) times daily as needed for moderate pain or severe pain.   No current facility-administered medications for this visit. (Other)      REVIEW OF SYSTEMS:    ALLERGIES Allergies  Allergen Reactions   Ace Inhibitors Cough   Lisinopril Cough   Statins Other (See Comments)    Body aches and pains    PAST MEDICAL HISTORY Past Medical History:  Diagnosis Date  Cervicalgia    Chronic kidney disease    Chronic kidney disease, stage III (moderate) (HCC)    Dermatophytosis of groin and perianal area    Dyspnea    Dysrhythmia    Hyperlipidemia LDL goal < 100    Insomnia, unspecified    Muscle weakness (generalized)    Orthostatic hypotension    Osteoarthrosis, unspecified whether generalized or localized, lower leg    osteoarthritis - upper thoracic area, left knee.   Other abnormal glucose    Other B-complex deficiencies    Other malaise and fatigue    Palpitations    Senile cataract, unspecified    Shortness of breath    Stroke (HCC)    small "stroke" - right sided weakness for a day.   Stroke (HCC)    Tachycardia, unspecified    Undiagnosed cardiac murmurs     Unspecified essential hypertension    Unspecified vitamin D deficiency    Urinary tract infection    Past Surgical History:  Procedure Laterality Date   CATARACT EXTRACTION W/PHACO Bilateral 1994   Dr. Dione BoozeGroat   CATARACT EXTRACTION, BILATERAL Bilateral    EYE SURGERY     cataract   RUPTURED GLOBE EXPLORATION AND REPAIR Left 09/03/2019   Procedure: Scleral laceration repair LEFT EYE, exam under anesthesia LEFT EYE;  Surgeon: Olivia CanterGroat, Richard Scott, MD;  [facility];  Service: Ophthalmology;  Laterality: Left;   TOE AMPUTATION  2002   TONSILLECTOMY  1944   TONSILLECTOMY     younger yrs   TOTAL KNEE ARTHROPLASTY Left 01/15/2015   Procedure: LEFT TOTAL KNEE ARTHROPLASTY;  Surgeon: Eugenia Mcalpineobert Collins, MD;  Location: [facility] ORS;  Service: Orthopedics;  Laterality: Left;   TUBAL LIGATION  1951    FAMILY HISTORY Family History  Problem Relation Age of Onset   Cerebral aneurysm Mother    Heart attack Father    Alzheimer's disease Sister    Emphysema Sister    Stroke Sister    Alzheimer's disease Sister    Arthritis Sister    Stroke Sister        Age 85    Lung cancer Son     SOCIAL HISTORY Social History   Tobacco Use   Smoking status: Never   Smokeless tobacco: Never  Vaping Use   Vaping Use: Never used  Substance Use Topics   Alcohol use: Not Currently    Comment: rare social every 3 or 4 months wine   Drug use: No         OPHTHALMIC EXAM:  Base Eye Exam     Visual Acuity (ETDRS)       Right Left   Dist cc 20/60 -1 20/200 -1   Dist ph cc NI          Tonometry (Tonopen, 11:09 AM)       Right Left   Pressure 09 12         Pupils       Dark Light Shape React APD   Right 3 2 Round Sluggish None   Left 6 6 Irregular Minimal None         Visual Fields (Counting fingers)       Left Right    Full Full         Extraocular Movement       Right Left    Full Full         Neuro/Psych     Oriented x3: Yes   Mood/Affect: Normal  Dilation     Both eyes: 1.0% Mydriacyl, 2.5% Phenylephrine @ 11:09 AM           Slit Lamp and Fundus Exam     External Exam       Right Left   External Normal Normal         Slit Lamp Exam       Right Left   Lids/Lashes Normal Normal   Conjunctiva/Sclera White and quiet White and quiet   Cornea Clear Clear   Anterior Chamber Deep and quiet Deep and quiet   Iris Round and reactive keyhole iris loss from 8-9 meridian nasally   Lens Posterior chamber intraocular lens Posterior chamber intraocular lens   Anterior Vitreous Normal Normal, old pigment posterior to iol         Fundus Exam       Right Left   Posterior Vitreous Posterior vitreous detachment Normal, mild old white vitreous debris   Disc Normal Normal   C/D Ratio 0.25 0.3   Macula no macular thickening, Hard drusen, no exudates, no hemorrhage Retinal pigment epithelial mottling, no signs of CNVM   Vessels Inferior macular branch retinal vein occlusion.  Much less thickening Normal   Periphery Normal Normal, attached and no choroidals            IMAGING AND PROCEDURES  Imaging and Procedures for 09/28/20  OCT, Retina - OU - Both Eyes       Right Eye Quality was borderline. Scan locations included subfoveal. Progression has been stable.   Left Eye Quality was good. Scan locations included subfoveal. Findings include subretinal hyper-reflective material.   Notes No signs of CNVM OS  OD with no recurrence of CNVM me     Color Fundus Photography Optos - OU - Both Eyes       Right Eye Progression has been stable. Disc findings include normal observations. Macula : microaneurysms.   Left Eye Progression has been stable. Macula : drusen.   Notes OD with good sector PRP, no recurrence of CME clear media  OS posterior segment intact, no change over time Foveal drusenoid PED notable large In the fovea no change              ASSESSMENT/PLAN:  Stable branch retinal vein occlusion of  right eye No change and no recurrence of CME and or N/V will observe OD  Prolapse of iris in recent wound, left, subsequent encounter Repaired per Dr Fabian Sharp  Cystoid macular edema of right eye Currently resolved OD  Intermediate stage nonexudative age-related macular degeneration of left eye No signs of CNVM formation OS.  Patient has asked to discontinue PreserVision.  I see no reason to continue its use because of the large subfoveal drusenoid deposit left eye     ICD-10-CM   1. Branch retinal vein occlusion with macular edema of right eye  H34.8310 OCT, Retina - OU - Both Eyes    Color Fundus Photography Optos - OU - Both Eyes    2. Stable branch retinal vein occlusion of right eye  H34.8312     3. Prolapse of iris in recent wound, left, subsequent encounter  S05.22XD     4. Cystoid macular edema of right eye  H35.351     5. Intermediate stage nonexudative age-related macular degeneration of left eye  H35.3122       1.  No signs of recurrence of CNVM OU  2.  No signs of recurrence of CME OD from BRVO  3.  Follow-up next OU in 6 months as no therapy warranted at this time OU  Ophthalmic Meds Ordered this visit:  No orders of the defined types were placed in this encounter.      Return in about 6 months (around 03/31/2021) for DILATE OU, OCT.  There are no Patient Instructions on file for this visit.   Explained the diagnoses, plan, and follow up with the patient and they expressed understanding.  Patient expressed understanding of the importance of proper follow up care.   Alford Highland Markela Wee M.D. Diseases & Surgery of the Retina and Vitreous [facility] 09/28/20     Abbreviations: M myopia (nearsighted); A astigmatism; H hyperopia (farsighted); P presbyopia; Mrx spectacle prescription;  CTL contact lenses; OD right eye; OS left eye; OU both eyes  XT exotropia; ET esotropia; PEK punctate epithelial keratitis; PEE punctate epithelial erosions;  DES dry eye syndrome; MGD meibomian gland dysfunction; ATs artificial tears; PFAT's preservative free artificial tears; NSC nuclear sclerotic cataract; PSC posterior subcapsular cataract; ERM epi-retinal membrane; PVD posterior vitreous detachment; RD retinal detachment; DM diabetes mellitus; DR diabetic retinopathy; NPDR non-proliferative diabetic retinopathy; PDR proliferative diabetic retinopathy; CSME clinically significant macular edema; DME diabetic macular edema; dbh dot blot hemorrhages; CWS cotton wool spot; POAG primary open angle glaucoma; C/D cup-to-disc ratio; HVF humphrey visual field; GVF goldmann visual field; OCT optical coherence tomography; IOP intraocular pressure; BRVO Branch retinal vein occlusion; CRVO central retinal vein occlusion; CRAO central retinal artery occlusion; BRAO branch retinal artery occlusion; RT retinal tear; SB scleral buckle; PPV pars plana vitrectomy; VH Vitreous hemorrhage; PRP panretinal laser photocoagulation; IVK intravitreal kenalog; VMT vitreomacular traction; MH Macular hole;  NVD neovascularization of the disc; NVE neovascularization elsewhere; AREDS age related eye disease study; ARMD age related macular degeneration; POAG primary open angle glaucoma; EBMD epithelial/anterior basement membrane dystrophy; ACIOL anterior chamber intraocular lens; IOL intraocular lens; PCIOL posterior chamber intraocular lens; Phaco/IOL phacoemulsification with intraocular lens placement; PRK photorefractive keratectomy; LASIK laser assisted in situ keratomileusis; HTN hypertension; DM diabetes mellitus; COPD chronic obstructive pulmonary disease

## 2020-09-30 DIAGNOSIS — I700 Atherosclerosis of aorta: Secondary | ICD-10-CM | POA: Diagnosis not present

## 2020-09-30 DIAGNOSIS — M1711 Unilateral primary osteoarthritis, right knee: Secondary | ICD-10-CM | POA: Diagnosis not present

## 2020-09-30 DIAGNOSIS — S2241XD Multiple fractures of ribs, right side, subsequent encounter for fracture with routine healing: Secondary | ICD-10-CM | POA: Diagnosis not present

## 2020-09-30 DIAGNOSIS — N1831 Chronic kidney disease, stage 3a: Secondary | ICD-10-CM | POA: Diagnosis not present

## 2020-09-30 DIAGNOSIS — I129 Hypertensive chronic kidney disease with stage 1 through stage 4 chronic kidney disease, or unspecified chronic kidney disease: Secondary | ICD-10-CM | POA: Diagnosis not present

## 2020-09-30 DIAGNOSIS — I951 Orthostatic hypotension: Secondary | ICD-10-CM | POA: Diagnosis not present

## 2020-10-01 DIAGNOSIS — N1831 Chronic kidney disease, stage 3a: Secondary | ICD-10-CM | POA: Diagnosis not present

## 2020-10-01 DIAGNOSIS — I700 Atherosclerosis of aorta: Secondary | ICD-10-CM | POA: Diagnosis not present

## 2020-10-01 DIAGNOSIS — I129 Hypertensive chronic kidney disease with stage 1 through stage 4 chronic kidney disease, or unspecified chronic kidney disease: Secondary | ICD-10-CM | POA: Diagnosis not present

## 2020-10-01 DIAGNOSIS — M1711 Unilateral primary osteoarthritis, right knee: Secondary | ICD-10-CM | POA: Diagnosis not present

## 2020-10-01 DIAGNOSIS — S2241XD Multiple fractures of ribs, right side, subsequent encounter for fracture with routine healing: Secondary | ICD-10-CM | POA: Diagnosis not present

## 2020-10-01 DIAGNOSIS — I951 Orthostatic hypotension: Secondary | ICD-10-CM | POA: Diagnosis not present

## 2020-10-04 DIAGNOSIS — M25552 Pain in left hip: Secondary | ICD-10-CM | POA: Diagnosis not present

## 2020-10-05 ENCOUNTER — Ambulatory Visit: Payer: Medicare Other | Admitting: Family Medicine

## 2020-10-18 DIAGNOSIS — R55 Syncope and collapse: Secondary | ICD-10-CM | POA: Diagnosis not present

## 2020-10-18 DIAGNOSIS — Z9181 History of falling: Secondary | ICD-10-CM | POA: Diagnosis not present

## 2020-10-18 DIAGNOSIS — M6282 Rhabdomyolysis: Secondary | ICD-10-CM | POA: Diagnosis not present

## 2020-10-22 ENCOUNTER — Telehealth: Payer: Medicare Other | Admitting: *Deleted

## 2020-10-22 DIAGNOSIS — G5603 Carpal tunnel syndrome, bilateral upper limbs: Secondary | ICD-10-CM | POA: Diagnosis not present

## 2020-10-22 NOTE — Telephone Encounter (Signed)
Lisa [name], daughter, called and stated that patient is having Frequent Urination with Burning and pain. No other symptoms noted.  Daughter is wanting something called in for a UTI.   I explained to daughter that patient would need an appointment to collect Urine Specimen but daughter stated that patient is too weak to come to office. Stated that the facility has no nurses today that can collect a urine specimen.   Daughter stated that if you don't prescribe something for her they will just wait till Monday to come in when patient isn't so weak.   Please Advise. (Dr. Hyacinth Meeker out of office)

## 2020-10-22 NOTE — Telephone Encounter (Signed)
I recommend office visit /urgent care for evaluation of weakness and possible UTI.

## 2020-10-22 NOTE — Telephone Encounter (Signed)
LMOM to return call.

## 2020-10-25 NOTE — Telephone Encounter (Signed)
Called daughter to follow up message.  She stated that Hospice came in and collected a Urine Specimen to send off for Culture.

## 2020-10-26 ENCOUNTER — Other Ambulatory Visit: Payer: Self-pay | Admitting: Family Medicine

## 2020-10-26 DIAGNOSIS — N3001 Acute cystitis with hematuria: Secondary | ICD-10-CM

## 2020-10-26 MED ORDER — NITROFURANTOIN MONOHYD MACRO 100 MG PO CAPS: 100.0000 mg | ORAL_CAPSULE | Freq: Two times a day (BID) | ORAL | 0 refills | Status: DC

## 2020-11-02 ENCOUNTER — Other Ambulatory Visit: Payer: Self-pay

## 2020-11-02 DIAGNOSIS — N3001 Acute cystitis with hematuria: Secondary | ICD-10-CM

## 2020-11-09 ENCOUNTER — Other Ambulatory Visit: Payer: Self-pay

## 2020-11-09 ENCOUNTER — Emergency Department (HOSPITAL_BASED_OUTPATIENT_CLINIC_OR_DEPARTMENT_OTHER): Admitting: Radiology

## 2020-11-09 ENCOUNTER — Encounter (HOSPITAL_BASED_OUTPATIENT_CLINIC_OR_DEPARTMENT_OTHER): Payer: Self-pay | Admitting: Emergency Medicine

## 2020-11-09 ENCOUNTER — Emergency Department (HOSPITAL_BASED_OUTPATIENT_CLINIC_OR_DEPARTMENT_OTHER)
Admission: EM | Admit: 2020-11-09 | Discharge: 2020-11-09 | Disposition: A | Discharge status: Home / Self Care | Attending: Emergency Medicine | Admitting: Emergency Medicine

## 2020-11-09 DIAGNOSIS — I129 Hypertensive chronic kidney disease with stage 1 through stage 4 chronic kidney disease, or unspecified chronic kidney disease: Secondary | ICD-10-CM | POA: Diagnosis not present

## 2020-11-09 DIAGNOSIS — S8992XA Unspecified injury of left lower leg, initial encounter: Secondary | ICD-10-CM | POA: Diagnosis present

## 2020-11-09 DIAGNOSIS — M25551 Pain in right hip: Secondary | ICD-10-CM | POA: Diagnosis not present

## 2020-11-09 DIAGNOSIS — S81812A Laceration without foreign body, left lower leg, initial encounter: Secondary | ICD-10-CM | POA: Insufficient documentation

## 2020-11-09 DIAGNOSIS — Z743 Need for continuous supervision: Secondary | ICD-10-CM | POA: Diagnosis not present

## 2020-11-09 DIAGNOSIS — W228XXA Striking against or struck by other objects, initial encounter: Secondary | ICD-10-CM | POA: Insufficient documentation

## 2020-11-09 DIAGNOSIS — R279 Unspecified lack of coordination: Secondary | ICD-10-CM | POA: Diagnosis not present

## 2020-11-09 DIAGNOSIS — Z96652 Presence of left artificial knee joint: Secondary | ICD-10-CM | POA: Diagnosis not present

## 2020-11-09 DIAGNOSIS — Z79899 Other long term (current) drug therapy: Secondary | ICD-10-CM | POA: Diagnosis not present

## 2020-11-09 DIAGNOSIS — N183 Chronic kidney disease, stage 3 unspecified: Secondary | ICD-10-CM | POA: Diagnosis not present

## 2020-11-09 DIAGNOSIS — R58 Hemorrhage, not elsewhere classified: Secondary | ICD-10-CM | POA: Diagnosis not present

## 2020-11-09 LAB — URINALYSIS, ROUTINE W REFLEX MICROSCOPIC
Bilirubin Urine: NEGATIVE
Glucose, UA: NEGATIVE mg/dL
Ketones, ur: NEGATIVE mg/dL
Nitrite: NEGATIVE
Protein, ur: 100 mg/dL — AB
Specific Gravity, Urine: 1.012 (ref 1.005–1.030)
WBC, UA: >50 WBC/hpf — ABNORMAL HIGH (ref 0–5)
pH: 6 (ref 5.0–8.0)

## 2020-11-09 MED ORDER — TRAMADOL HCL 50 MG PO TABS
25.0000 mg | ORAL_TABLET | Freq: Once | ORAL | Status: AC
  Administered 2020-11-09: 25 mg via ORAL
  Filled 2020-11-09: qty 1

## 2020-11-09 NOTE — ED Provider Notes (Signed)
I saw and evaluated the patient, reviewed the resident's note and I agree with the findings and plan.  87 year old female presents with laceration to left ankle.  Has approximately 1 cm superficial cut.  Also has some right-sided atraumatic hip pain.  She is neurovascular intact.  Will x-ray right hip and repair left ankle   Lorre Nick, MD 11/09/20 1218

## 2020-11-09 NOTE — ED Provider Notes (Signed)
[facility]   CSN: [id number] Arrival date & time: 11/09/20  1145     History Chief Complaint  Patient presents with   Laceration    Lisa [name] is a 87 y.o. female.  87 year old female with a history of CKD stage III, CVA, orthostatic syncope [facility] with laceration to her left lower leg.  Patient states she accidentally hit her left leg against her wheelchair this morning.  Denies any fall or other injury.  She was told by nursing staff that the laceration will need to be repaired.  She is also reporting right leg pain, primarily in her right hip, that started yesterday.  She denies any known injury.  Pain travels from her right hip down her entire right leg.  The history is provided by the patient. No language interpreter was used.  Laceration     Past Medical History:  Diagnosis Date   Cervicalgia    Chronic kidney disease    Chronic kidney disease, stage III (moderate) (HCC)    Dermatophytosis of groin and perianal area    Dyspnea    Dysrhythmia    Hyperlipidemia LDL goal < 100    Insomnia, unspecified    Muscle weakness (generalized)    Orthostatic hypotension    Osteoarthrosis, unspecified whether generalized or localized, lower leg    osteoarthritis - upper thoracic area, left knee.   Other abnormal glucose    Other B-complex deficiencies    Other malaise and fatigue    Palpitations    Senile cataract, unspecified    Shortness of breath    Stroke (HCC)    small "stroke" - right sided weakness for a day.   Stroke (HCC)    Tachycardia, unspecified    Undiagnosed cardiac murmurs    Unspecified essential hypertension    Unspecified vitamin D deficiency    Urinary tract infection     Patient Active Problem List   Diagnosis Date Noted   Aortic atherosclerosis (HCC) 08/11/2020   Rib fractures 08/10/2020   Intermediate stage nonexudative age-related macular degeneration of left eye  01/05/2020   Prolapse of iris in recent wound, left, subsequent encounter 09/16/2019   Orthostatic syncope 09/04/2019   Scleral laceration of left eye 09/03/2019   Fall at home, initial encounter 09/03/2019   Syncope and collapse 09/03/2019   Generalized weakness 09/03/2019   Recurrent UTI 09/03/2019   Stable branch retinal vein occlusion of right eye 07/07/2019   Branch retinal vein occlusion with neovascularization of right eye 07/07/2019   Cystoid macular edema of right eye 07/07/2019   Exudative age-related macular degeneration of left eye with inactive choroidal neovascularization (HCC) 07/07/2019   Genitourinary syndrome of menopause 07/01/2019   Other fatigue 12/03/2017   Anemia 12/03/2017   Balance problem 12/03/2017   Lichen sclerosus of female genitalia 12/03/2017   Syncope 07/17/2017   Neck pain, chronic 07/05/2015   Status post total left knee replacement 07/05/2015   Primary osteoarthritis of left knee 01/15/2015   S/P knee replacement 01/15/2015   Dry skin 01/28/2014   HLD (hyperlipidemia) 01/06/2014   Essential hypertension 01/06/2014   B12 deficiency 09/29/2013   Mixed hyperlipidemia 09/29/2013   Hypertension, renal disease, stage 1-4 or unspecified chronic kidney disease 06/26/2013   Seasonal allergies 06/26/2013   CVA (cerebral vascular accident) (HCC) 04/09/2013   Stroke (HCC) 04/09/2013   Insomnia 09/14/2012   Hyperlipidemia    Osteoarthritis of right knee    Undiagnosed cardiac murmurs  Shortness of breath    Chronic kidney disease, stage III (moderate) (HCC)    Cervicalgia    Dyspnea 11/30/2008    Past Surgical History:  Procedure Laterality Date   CATARACT EXTRACTION W/PHACO Bilateral 1994   Dr. Dione Booze   CATARACT EXTRACTION, BILATERAL Bilateral    EYE SURGERY     cataract   RUPTURED GLOBE EXPLORATION AND REPAIR Left 09/03/2019   Procedure: Scleral laceration repair LEFT EYE, exam under anesthesia LEFT EYE;  Surgeon: Olivia Canter, MD;   Location: [facility] OR;  Service: Ophthalmology;  Laterality: Left;   TOE AMPUTATION  2002   TONSILLECTOMY  1944   TONSILLECTOMY     younger yrs   TOTAL KNEE ARTHROPLASTY Left 01/15/2015   Procedure: LEFT TOTAL KNEE ARTHROPLASTY;  Surgeon: Eugenia Mcalpine, MD;  Location: [facility] ORS;  Service: Orthopedics;  Laterality: Left;   TUBAL LIGATION  1951     OB History   No obstetric history on file.     Family History  Problem Relation Age of Onset   Cerebral aneurysm Mother    Heart attack Father    Alzheimer's disease Sister    Emphysema Sister    Stroke Sister    Alzheimer's disease Sister    Arthritis Sister    Stroke Sister        Age 78    Lung cancer Son     Social History   Tobacco Use   Smoking status: Never   Smokeless tobacco: Never  Vaping Use   Vaping Use: Never used  Substance Use Topics   Alcohol use: Not Currently    Comment: rare social every 3 or 4 months wine   Drug use: No    Home Medications Prior to Admission medications   Medication Sig Start Date End Date Taking? Authorizing Provider  acetaminophen (TYLENOL) 325 MG tablet Take 2 tablets (650 mg total) by mouth every 6 (six) hours as needed for mild pain (or Fever >/= 101). Patient taking differently: Take 1,000 mg by mouth at bedtime. 08/12/20  Yes Standley Brooking, MD  carvedilol (COREG) 6.25 MG tablet Take 1/2 tablet by mouth twice daily Patient taking differently: Take 3.125 mg by mouth 2 (two) times daily with a meal. 07/15/20  Yes Sharon Seller, NP  cephALEXin (KEFLEX) 500 MG capsule Take 500 mg by mouth 2 (two) times daily.   Yes [provider]  Cranberry 200 MG CAPS Take 200 mg by mouth daily.   Yes [provider]  estradiol (ESTRACE) 0.1 MG/GM vaginal cream Place 1 Applicatorful vaginally every 14 (fourteen) days.   Yes [provider]  Melatonin 2.5 MG CHEW Chew 5 mg by mouth at bedtime.   Yes [provider]  mometasone (ELOCON) 0.1 % ointment Apply 1  application topically 2 (two) times daily. To vaginal area   Yes [provider]  nitrofurantoin, macrocrystal-monohydrate, (MACROBID) 100 MG capsule Take 1 capsule (100 mg total) by mouth 2 (two) times daily. 10/28/20  Yes Frederica Kuster, MD  saccharomyces boulardii (FLORASTOR) 250 MG capsule Take 250 mg by mouth daily.   Yes [provider]  sertraline (ZOLOFT) 25 MG tablet Take 25 mg by mouth daily.   Yes [provider]  traMADol (ULTRAM) 50 MG tablet Take 1 tablet (50 mg total) by mouth 2 (two) times daily as needed for moderate pain or severe pain. 08/12/20  Yes Standley Brooking, MD  bacitracin 500 UNIT/GM ointment Apply topically daily. Patient not taking: Reported  on 11/09/2020 08/12/20   Standley Brooking, MD    Allergies    Ace inhibitors, Lisinopril, and Statins  Review of Systems   Review of Systems  Respiratory:  Negative for shortness of breath.   Cardiovascular:  Negative for chest pain.  Musculoskeletal:  Positive for arthralgias.  Skin:  Positive for wound.  Neurological:  Negative for syncope.  Hematological:  Bruises/bleeds easily.   Physical Exam Updated Vital Signs BP (!) 165/87 (BP Location: Right Arm)   Pulse 70   Temp 98.6 F (37 C) (Oral)   Resp 16   Ht 5\' 7"  (1.702 m)   Wt 51.3 kg   SpO2 99%   BMI 17.70 kg/m   Physical Exam Vitals and nursing note reviewed.  Constitutional:      General: She is not in acute distress.    Appearance: She is well-developed.  HENT:     Head: Normocephalic and atraumatic.  Eyes:     Conjunctiva/sclera: Conjunctivae normal.  Cardiovascular:     Rate and Rhythm: Normal rate and regular rhythm.     Heart sounds: No murmur heard. Pulmonary:     Effort: Pulmonary effort is normal. No respiratory distress.     Breath sounds: Normal breath sounds.  Abdominal:     Palpations: Abdomen is soft.     Tenderness: There is no abdominal tenderness.  Musculoskeletal:     Cervical back: Neck  supple.     Comments: Negative logroll on the right.  Pain with active flexion of the right hip.  No pain with passive flexion of right hip.  Full passive ROM with flexion and extension at the right knee without pain.  Skin:    General: Skin is warm and dry.     Comments: Left lower leg just superior to lateral ankle is approximately 2 cm laceration  Neurological:     Mental Status: She is alert and oriented to person, place, and time. Mental status is at baseline.    ED Results / Procedures / Treatments   Labs (all labs ordered are listed, but only abnormal results are displayed) Labs Reviewed  URINE CULTURE  URINALYSIS, ROUTINE W REFLEX MICROSCOPIC    EKG None  Radiology DG Hip Unilat With Pelvis 2-3 Views Right  Result Date: 11/09/2020 CLINICAL DATA:  Right hip pain, no known inj EXAM: DG HIP (WITH OR WITHOUT PELVIS) 2-3V RIGHT COMPARISON:  None. FINDINGS: Osteopenia. There is no evidence of displaced hip fracture or dislocation. Minimal, symmetric bilateral superior joint space loss and acetabular osteophytosis. IMPRESSION: 1. Osteopenia. No evidence of displaced hip fracture or dislocation. 2. Minimal, symmetric bilateral superior joint space loss and acetabular osteophytosis. Electronically Signed   By: 11/11/2020 M.D.   On: 11/09/2020 12:58    Procedures .08/25/2022Laceration Repair  Date/Time: 11/09/2020 3:43 PM Performed by: 11/11/2020, MD Authorized by: Littie Deeds, MD   Consent:    Consent obtained:  Verbal   Consent given by:  Patient Universal protocol:    Patient identity confirmed:  Verbally with patient Laceration details:    Location:  Leg   Leg location:  L lower leg   Length (cm):  2 Skin repair:    Repair method:  Staples   Number of staples:  3 Approximation:    Approximation:  Close Repair type:    Repair type:  Simple Post-procedure details:    Dressing:  Open (no dressing)   Procedure completion:  Tolerated Comments:     1 staple came loose  and  had to be removed and replaced   Medications Ordered in ED Medications  traMADol (ULTRAM) tablet 25 mg (25 mg Oral Given 11/09/20 1433)    ED Course  I have reviewed the triage vital signs and the nursing notes.  Pertinent labs & imaging results that were available during my care of the patient were reviewed by me and considered in my medical decision making (see chart for details).    MDM Rules/Calculators/A&P                         87 year old female presenting with superficial laceration to her left ankle and right hip/leg pain without any known trauma.  We will plan to use staples to fix laceration and will order XR hip to rule out fracture.  3 staples were placed as noted above.  Patient to have these removed in 7 to 10 days.  X-ray without any evidence of fracture.  Suspect possible muscular component of pain given pain primarily with active range of motion.  Recommended that she discuss with her primary doctor about increasing her pain medications as she is currently prescribed tramadol.  Final Clinical Impression(s) / ED Diagnoses Final diagnoses:  Laceration of skin of left lower leg, initial encounter  Right hip pain    Rx / DC Orders ED Discharge Orders     None        Littie Deeds, MD 11/09/20 1555    Lorre Nick, MD 11/14/20 [phone]

## 2020-11-09 NOTE — ED Notes (Signed)
PTAR AWARE [id number]

## 2020-11-09 NOTE — ED Triage Notes (Signed)
Pt arrives from [facility] home for a laceration to left lower ankle. Pt accidentally hit it on part of her wheelchair. Pt also complains of pain in her right leg that radiates from her right hip into her lower extremity. Pain is only with exertion.

## 2020-11-09 NOTE — Discharge Instructions (Addendum)
Have your staples removed in 7 to 10 days either by your primary doctor or return to the ED or urgent care. Keep dry for at least 2 days. Avoid submerging wound in water.

## 2020-11-09 NOTE — ED Notes (Signed)
Dr Audley Hose reviewed urine results.  Order received for keflex 500mg  TID PO X 3 days.  Spoke with the nurse at [facility].

## 2020-11-12 LAB — URINE CULTURE: Culture: >=100000 — AB

## 2020-11-13 ENCOUNTER — Telehealth: Payer: Self-pay | Admitting: Emergency Medicine

## 2020-11-13 NOTE — Telephone Encounter (Unsigned)
Post ED Visit - Positive Culture Follow-up  Culture report reviewed by antimicrobial stewardship pharmacist: [facility] []  , Pharm.D. []  Enzo Bi, Pharm.D., BCPS AQ-ID []  , Pharm.D., BCPS []  Celedonio Miyamoto, Pharm.D., BCPS []  Bertram, Garvin Fila.D., BCPS, AAHIVP []  , Pharm.D., BCPS, AAHIVP []  Georgina Pillion, PharmD, BCPS []  , PharmD, BCPS []  [place], PharmD, BCPS [x]  [address], PharmD []  , PharmD, BCPS []  Estella Husk, PharmD  Pharmacy Team []  Lysle Pearl, PharmD []  , PharmD []  Phillips Climes, PharmD []  , Rph []  Agapito Games) , PharmD []  Delmar Landau, PharmD []  , PharmD []  Mervyn Gay, PharmD []  , PharmD []  Vinnie Level, PharmD []  [facility], PharmD []  , PharmD []  Len Childs, PharmD   Positive urine culture Treated with Cephalexin, organism sensitive to the same and no further patient follow-up is required at this time.  11/13/2020, 2:43 PM

## 2020-11-18 DIAGNOSIS — M6282 Rhabdomyolysis: Secondary | ICD-10-CM | POA: Diagnosis not present

## 2020-11-18 DIAGNOSIS — Z9181 History of falling: Secondary | ICD-10-CM | POA: Diagnosis not present

## 2020-11-18 DIAGNOSIS — R55 Syncope and collapse: Secondary | ICD-10-CM | POA: Diagnosis not present

## 2020-11-23 DIAGNOSIS — G5603 Carpal tunnel syndrome, bilateral upper limbs: Secondary | ICD-10-CM | POA: Diagnosis not present

## 2020-12-18 DIAGNOSIS — R55 Syncope and collapse: Secondary | ICD-10-CM | POA: Diagnosis not present

## 2020-12-18 DIAGNOSIS — M6282 Rhabdomyolysis: Secondary | ICD-10-CM | POA: Diagnosis not present

## 2020-12-18 DIAGNOSIS — Z9181 History of falling: Secondary | ICD-10-CM | POA: Diagnosis not present

## 2021-02-17 DEATH — deceased

## 2021-04-05 ENCOUNTER — Encounter (INDEPENDENT_AMBULATORY_CARE_PROVIDER_SITE_OTHER): Payer: Medicare Other | Admitting: Ophthalmology

## 2021-04-08 ENCOUNTER — Telehealth: Payer: Self-pay

## 2021-04-08 NOTE — Telephone Encounter (Signed)
Volunteer called patient/family on behalf of [facility] and did not get a answer from patient/family. ° °

## 2021-04-12 ENCOUNTER — Encounter (INDEPENDENT_AMBULATORY_CARE_PROVIDER_SITE_OTHER): Payer: Medicare Other | Admitting: Ophthalmology

## 2021-11-07 IMAGING — CT CT HEAD W/O CM
3 series · 14 of 47 positions shown, 16 images · non-contrast
Comparison: Brain MRI 07/17/2017.

CLINICAL DATA: Head trauma, minor. Neck trauma. Additional history
provided: Fall, neck pain.

EXAM:
CT HEAD WITHOUT CONTRAST
CT CERVICAL SPINE WITHOUT CONTRAST
TECHNIQUE: Multidetector CT imaging of the head and cervical spine was
performed following the standard protocol without intravenous
contrast. Multiplanar CT image reconstructions of the cervical spine
were also generated.

[Series 3: head 5.0 h30s · axial · 0.45mm/px · z∈[-101,+34]mm · 8 of 33 slices shown, 10 images]
[im 3/33  brain]
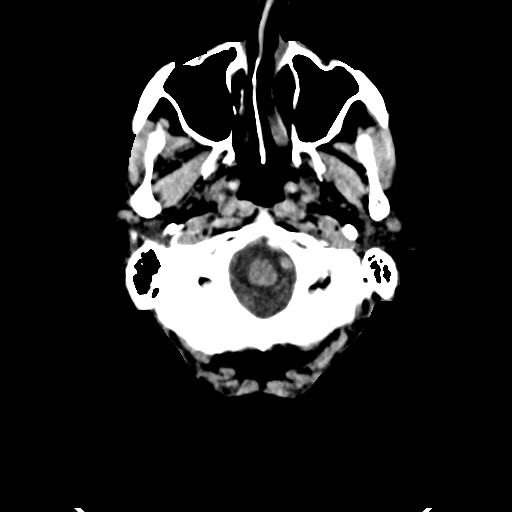
[im 3/33  bone]
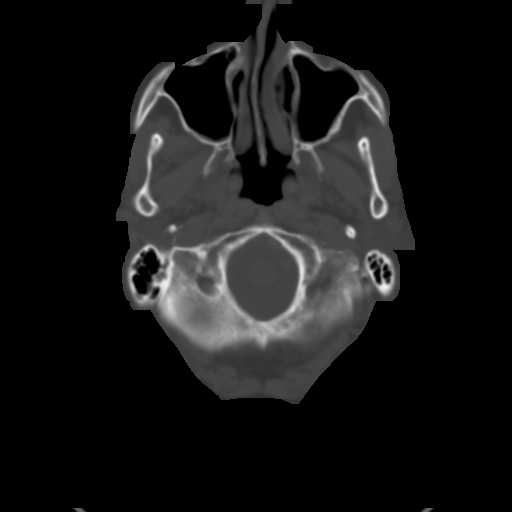
[im 7/33  brain]
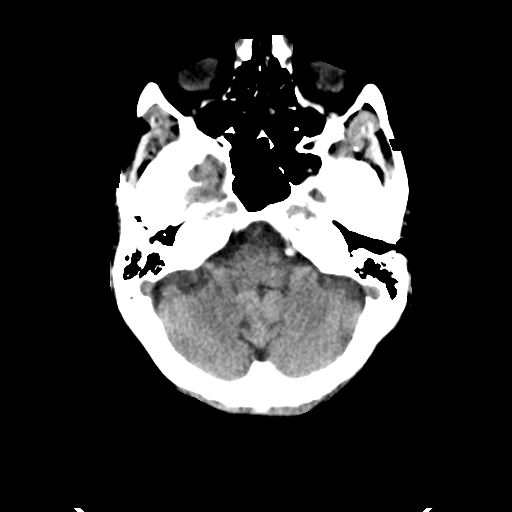
[im 10/33  brain]
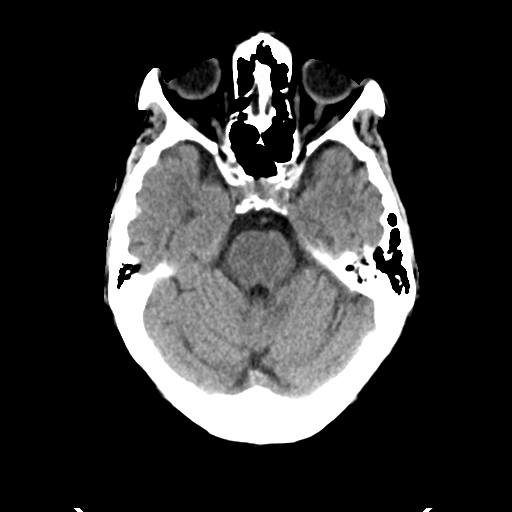
[im 15/33  brain]
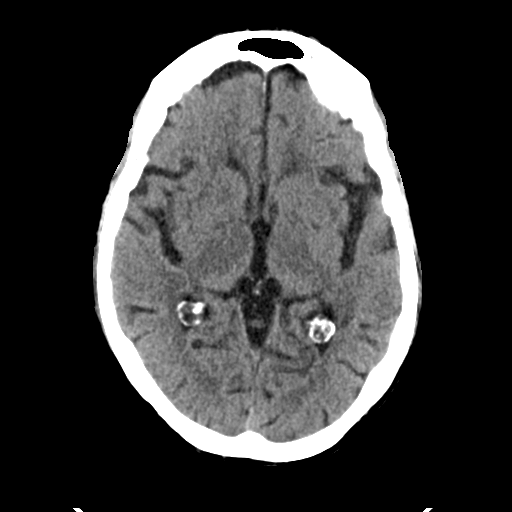
[im 18/33  brain]
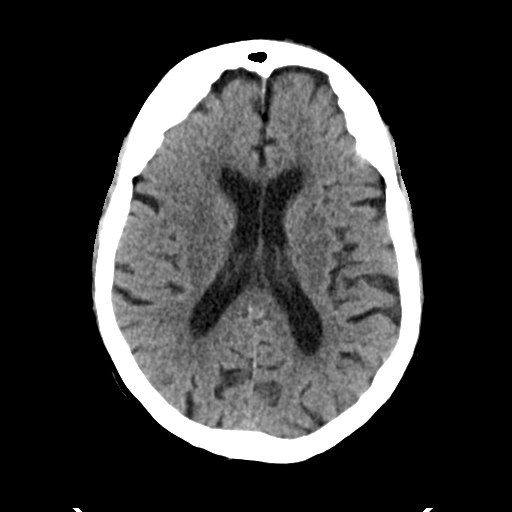
[im 18/33  bone]
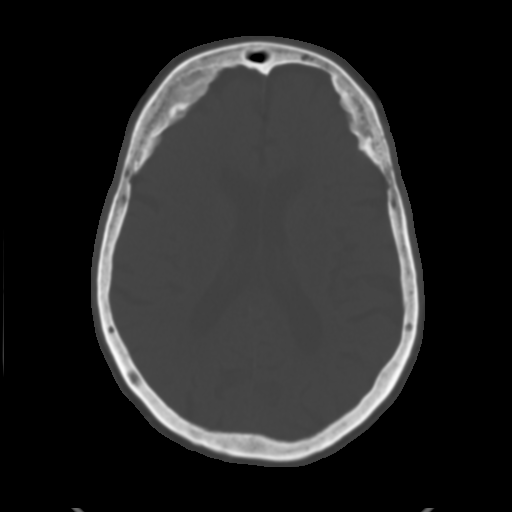
[im 23/33  brain]
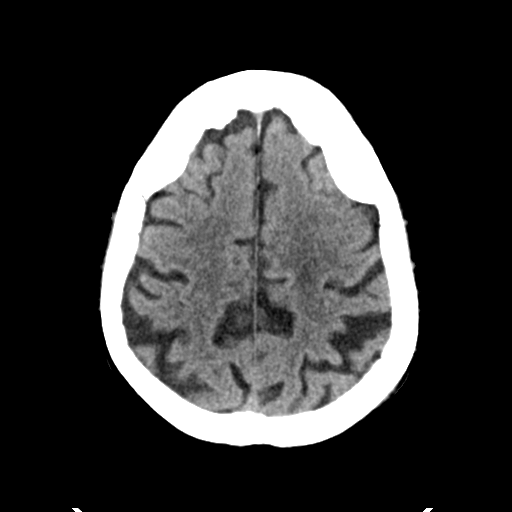
[im 26/33  brain]
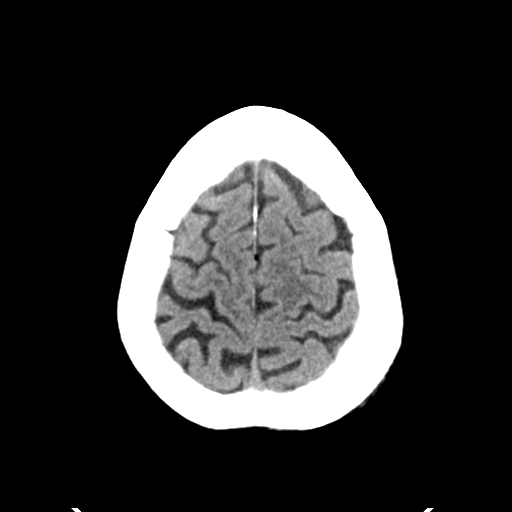
[im 30/33  brain]
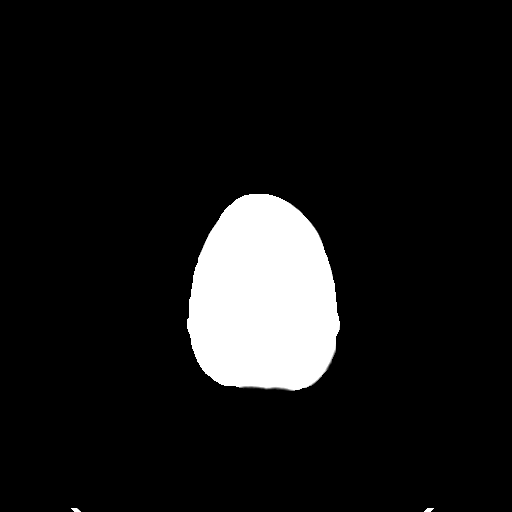

[Series 5: head 3.0 mpr cor · coronal · 0.32mm/px · 3 of 72 slices shown]
[im 24/72  brain]
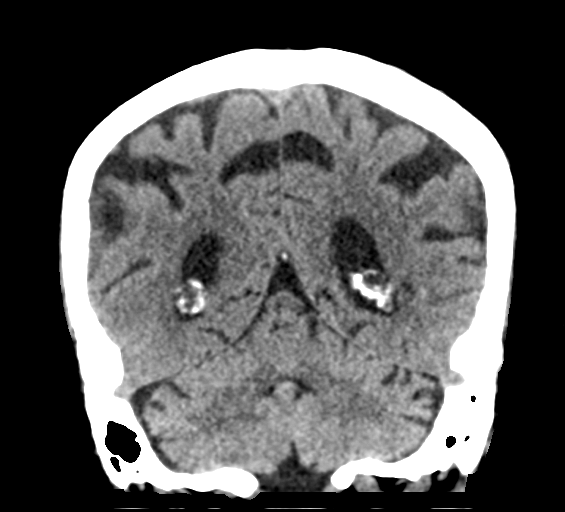
[im 32/72  brain]
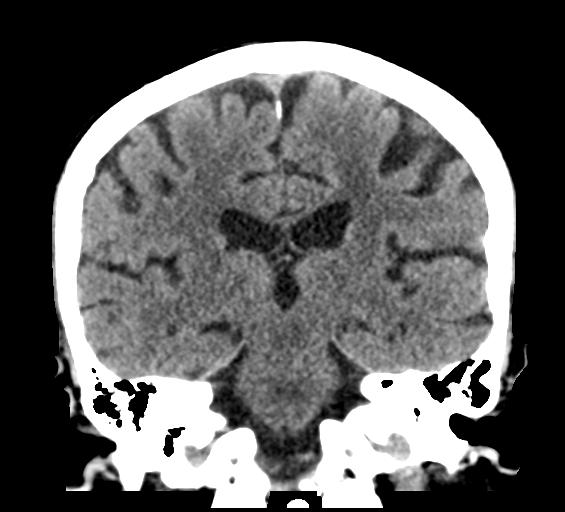
[im 40/72  brain]
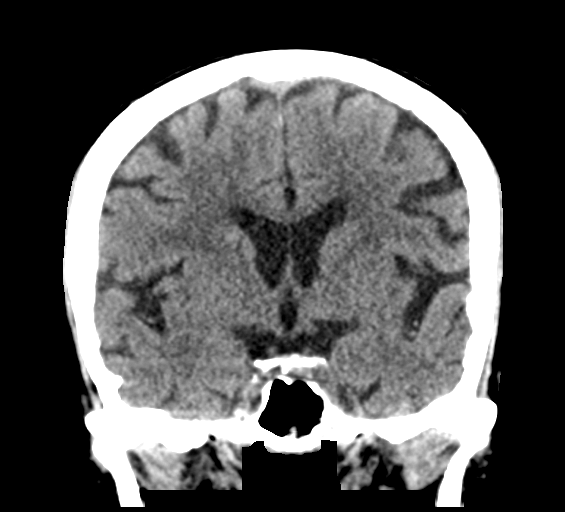

[Series 6: head 3.0 mpr sag · sagittal · 0.32mm/px · 3 of 67 slices shown]
[im 23/67  brain]
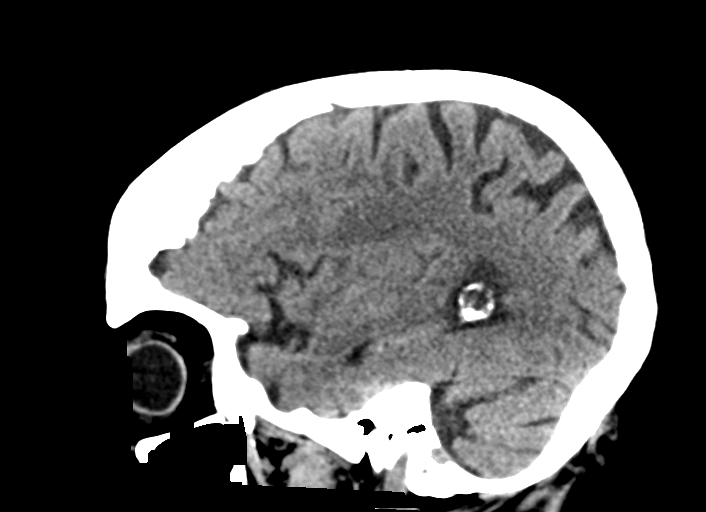
[im 34/67  brain]
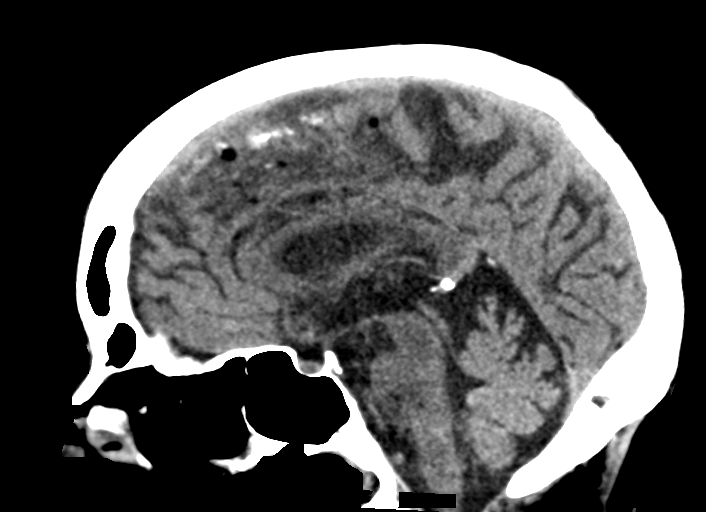
[im 45/67  brain]
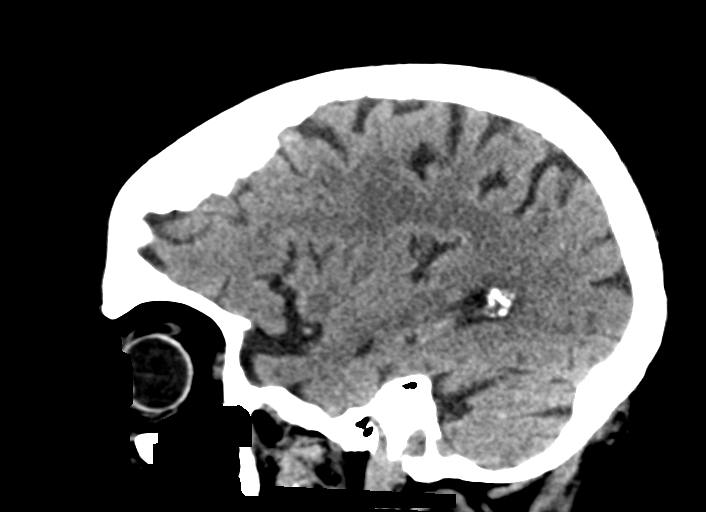

[14 of 47 positions shown; findings below may reference images not displayed]

FINDINGS: CT HEAD FINDINGS

Brain:

Stable, mild generalized cerebral atrophy.

Stable, mild ill-defined hypoattenuation within the cerebral white
matter which is nonspecific, but consistent with chronic small
vessel ischemic disease.

Redemonstrated chronic left corona radiata/basal ganglia lacunar
infarct.

A small remote infarct within the left cerebellar hemisphere was
better appreciated on the prior MRI.

There is no acute intracranial hemorrhage.

No demarcated cortical infarct.

No extra-axial fluid collection.

No evidence of intracranial mass.

No midline shift.

Vascular: No hyperdense vessel.  Atherosclerotic calcifications

Skull: Normal. Negative for fracture or focal lesion.

Sinuses/Orbits: Visualized orbits show no acute finding. Moderate
ethmoid sinus mucosal thickening. Frothy secretions within the right
maxillary sinus. Mild left maxillary sinus mucosal thickening. No
significant mastoid effusion.

Other: Small left parietal scalp hematoma.

CT CERVICAL SPINE FINDINGS

Alignment: Straightening of the expected cervical lordosis. 2 mm
C4-C5 grade 1 anterolisthesis. Trace C5-C6 grade 1 retrolisthesis.

Skull base and vertebrae: The basion-dental and atlanto-dental
intervals are maintained.No evidence of acute fracture to the
cervical spine.

Soft tissues and spinal canal: No prevertebral fluid or swelling. No
visible canal hematoma.

Disc levels: Cervical spondylosis with multilevel disc space
narrowing, disc bulges, posterior disc osteophytes, uncovertebral
and facet hypertrophy. Degenerative C3-C4 fusion across the disc
space and posterior elements.

Upper chest: No consolidation within the imaged lung apices. No
visible pneumothorax.
IMPRESSION: CT head:

1. No evidence of acute intracranial abnormality.
2. Small left parietal scalp hematoma.
3. Stable mild generalized cerebral atrophy and chronic small vessel
ischemic disease.
4. Redemonstrated remote infarcts within the left corona
radiata/basal ganglia and left cerebellum.
5. Right maxillary sinusitis. Ethmoid and left maxillary sinus
mucosal thickening as described.

CT cervical spine:

1. No evidence of acute fracture to the cervical spine.
2. 2 mm C4-C5 grade 1 anterolisthesis.
3. Trace C5-C6 grade 1 retrolisthesis.
4. Cervical spondylosis as described.
